# Patient Record
Sex: Female | Born: 1950
Health system: Southern US, Community
[De-identification: ages and names within clinical notes are randomized; demographics above are authoritative.]

## PROBLEM LIST (undated history)

## (undated) DIAGNOSIS — E119 Type 2 diabetes mellitus without complications: Secondary | ICD-10-CM

## (undated) DIAGNOSIS — R51 Headache: Secondary | ICD-10-CM

## (undated) DIAGNOSIS — G629 Polyneuropathy, unspecified: Secondary | ICD-10-CM

## (undated) DIAGNOSIS — F32A Depression, unspecified: Secondary | ICD-10-CM

## (undated) DIAGNOSIS — K219 Gastro-esophageal reflux disease without esophagitis: Secondary | ICD-10-CM

## (undated) DIAGNOSIS — M069 Rheumatoid arthritis, unspecified: Secondary | ICD-10-CM

## (undated) DIAGNOSIS — F329 Major depressive disorder, single episode, unspecified: Secondary | ICD-10-CM

## (undated) DIAGNOSIS — I10 Essential (primary) hypertension: Secondary | ICD-10-CM

## (undated) DIAGNOSIS — M1990 Unspecified osteoarthritis, unspecified site: Secondary | ICD-10-CM

## (undated) DIAGNOSIS — IMO0001 Reserved for inherently not codable concepts without codable children: Secondary | ICD-10-CM

## (undated) DIAGNOSIS — E785 Hyperlipidemia, unspecified: Secondary | ICD-10-CM

## (undated) DIAGNOSIS — T7840XA Allergy, unspecified, initial encounter: Secondary | ICD-10-CM

## (undated) DIAGNOSIS — E1142 Type 2 diabetes mellitus with diabetic polyneuropathy: Secondary | ICD-10-CM

## (undated) DIAGNOSIS — D649 Anemia, unspecified: Secondary | ICD-10-CM

## (undated) DIAGNOSIS — K52832 Lymphocytic colitis: Secondary | ICD-10-CM

## (undated) DIAGNOSIS — R519 Headache, unspecified: Secondary | ICD-10-CM

## (undated) DIAGNOSIS — K621 Rectal polyp: Secondary | ICD-10-CM

## (undated) HISTORY — DX: Major depressive disorder, single episode, unspecified: F32.9

## (undated) HISTORY — DX: Lymphocytic colitis: K52.832

## (undated) HISTORY — PX: TONSILLECTOMY: SUR1361

## (undated) HISTORY — DX: Allergy, unspecified, initial encounter: T78.40XA

## (undated) HISTORY — DX: Rectal polyp: K62.1

## (undated) HISTORY — DX: Type 2 diabetes mellitus with diabetic polyneuropathy: E11.42

## (undated) HISTORY — DX: Depression, unspecified: F32.A

## (undated) HISTORY — DX: Hyperlipidemia, unspecified: E78.5

## (undated) HISTORY — DX: Rheumatoid arthritis, unspecified: M06.9

## (undated) HISTORY — DX: Polyneuropathy, unspecified: G62.9

## (undated) HISTORY — PX: WISDOM TOOTH EXTRACTION: SHX21

## (undated) HISTORY — DX: Type 2 diabetes mellitus without complications: E11.9

## (undated) HISTORY — DX: Essential (primary) hypertension: I10

## (undated) HISTORY — DX: Gastro-esophageal reflux disease without esophagitis: K21.9

## (undated) HISTORY — DX: Anemia, unspecified: D64.9

## (undated) HISTORY — PX: CYST REMOVAL TRUNK: SHX6283

## (undated) HISTORY — DX: Unspecified osteoarthritis, unspecified site: M19.90

---

## 1998-04-03 ENCOUNTER — Other Ambulatory Visit: Admission: RE | Admit: 1998-04-03 | Discharge: 1998-04-03 | Payer: Self-pay

## 1999-12-23 ENCOUNTER — Other Ambulatory Visit: Admission: RE | Admit: 1999-12-23 | Discharge: 1999-12-23 | Payer: Self-pay | Admitting: Family Medicine

## 2000-02-16 ENCOUNTER — Other Ambulatory Visit: Admission: RE | Admit: 2000-02-16 | Discharge: 2000-02-16 | Payer: Self-pay | Admitting: Family Medicine

## 2000-09-08 ENCOUNTER — Encounter: Admission: RE | Admit: 2000-09-08 | Discharge: 2000-09-20 | Payer: Self-pay | Admitting: Family Medicine

## 2002-01-31 ENCOUNTER — Other Ambulatory Visit: Admission: RE | Admit: 2002-01-31 | Discharge: 2002-01-31 | Payer: Self-pay | Admitting: Family Medicine

## 2002-08-17 HISTORY — PX: COLONOSCOPY: SHX174

## 2002-08-17 HISTORY — PX: ESOPHAGOGASTRODUODENOSCOPY: SHX1529

## 2002-08-28 ENCOUNTER — Ambulatory Visit (HOSPITAL_COMMUNITY): Admission: RE | Admit: 2002-08-28 | Discharge: 2002-08-28 | Payer: Self-pay | Admitting: Internal Medicine

## 2003-03-13 ENCOUNTER — Other Ambulatory Visit: Admission: RE | Admit: 2003-03-13 | Discharge: 2003-03-13 | Payer: Self-pay | Admitting: Family Medicine

## 2003-05-18 ENCOUNTER — Ambulatory Visit (HOSPITAL_BASED_OUTPATIENT_CLINIC_OR_DEPARTMENT_OTHER): Admission: RE | Admit: 2003-05-18 | Discharge: 2003-05-18 | Payer: Self-pay | Admitting: General Surgery

## 2003-05-18 ENCOUNTER — Encounter (INDEPENDENT_AMBULATORY_CARE_PROVIDER_SITE_OTHER): Payer: Self-pay | Admitting: Specialist

## 2003-05-18 ENCOUNTER — Ambulatory Visit (HOSPITAL_COMMUNITY): Admission: RE | Admit: 2003-05-18 | Discharge: 2003-05-18 | Payer: Self-pay | Admitting: General Surgery

## 2004-12-10 ENCOUNTER — Ambulatory Visit: Payer: Self-pay | Admitting: Internal Medicine

## 2005-08-11 ENCOUNTER — Other Ambulatory Visit: Admission: RE | Admit: 2005-08-11 | Discharge: 2005-08-11 | Payer: Self-pay | Admitting: Family Medicine

## 2006-12-30 ENCOUNTER — Ambulatory Visit: Payer: Self-pay | Admitting: Internal Medicine

## 2009-01-21 DIAGNOSIS — K208 Other esophagitis: Secondary | ICD-10-CM

## 2009-01-21 DIAGNOSIS — M069 Rheumatoid arthritis, unspecified: Secondary | ICD-10-CM | POA: Insufficient documentation

## 2009-01-21 DIAGNOSIS — K219 Gastro-esophageal reflux disease without esophagitis: Secondary | ICD-10-CM

## 2009-01-21 DIAGNOSIS — R609 Edema, unspecified: Secondary | ICD-10-CM | POA: Insufficient documentation

## 2009-01-21 DIAGNOSIS — M779 Enthesopathy, unspecified: Secondary | ICD-10-CM | POA: Insufficient documentation

## 2009-01-21 DIAGNOSIS — E669 Obesity, unspecified: Secondary | ICD-10-CM | POA: Insufficient documentation

## 2010-12-30 NOTE — Assessment & Plan Note (Signed)
Donna Rogers                CHART#:  [id number]   DATE:  12/30/2006                       DOB:  07-02-1951   FOLLOWUP OFFICE VISIT   CHIEF COMPLAINT:  A 2 year followup for chronic GERD.   SUBJECTIVE:  Donna Rogers is a 60 year old Caucasian female with a history  of erosive reflux esophagitis and chronic GERD.  She has been maintained  on Protonix 40 mg daily for years now.  She denies any breakthrough  heartburn, indigestion, dysphagia, odynophagia, anorexia, early satiety.  Her weight has remained stable.   PAST MEDICAL HISTORY:  She has tendonitis of right shoulder, lower  extremity edema, chronic GERD, erosive reflux esophagitis.  Last EGD was  January of 2004, and she had a screening colonoscopy at the same time.  She had distal esophageal erosions consistent with non-erosive reflux  esophagitis, a patulous gastroesophageal junction, small hiatal hernia,  otherwise, normal exam.  She also had internal hemorrhoids on her  colonoscopy.   CURRENT MEDICATIONS:  See the list from Dec 30, 2006.   FAMILY HISTORY:  No known chronic GI problems.   SOCIAL HISTORY:  Donna Rogers is single.  She has 1 healthy child.  She is  employed with [employer]  She is a smoker.  Denies any alcohol or  drug use.   REVIEW OF SYSTEMS:  CONSTITUTIONAL:  Denies any fevers or chills.  CARDIOVASCULAR:  Denies chest pain or palpitations.  PULMONARY:  Denies shortness of breath, wheezing, cough, hemoptysis.  GASTROINTESTINAL:  See HPI.  Denies any rectal bleeding, melena, constipation, or diarrhea.   PHYSICAL EXAM:  VITAL SIGNS:  Weight 194 pounds, height 63 inches,  temperature 98.8.  Blood pressure 118/82, pulse 64.  GENERAL:  Donna Rogers is a 60 year old Caucasian female who is alert,  pleasant, cooperative, in no acute distress.  HEENT:  Pupils equal, round, and reactive to light.  Clear.  No icterus.  Conjunctivae clear.  Oropharynx pink and moist without any lesions.  HEART:   Regular rate and rhythm.  Normal S1, S2 without murmurs, clicks,  rubs, or gallops.  LUNGS:  Clear to auscultation bilaterally.  ABDOMEN:  Positive bowel sounds x4.  No bruits auscultated.  Soft,  nontender, nondistended without palpable mass, hepatosplenomegaly.  No  rebound tenderness, guarding.  EXTREMITIES:  Without clubbing or edema bilaterally.   ASSESSMENT:  Donna Rogers is a 60 year old Caucasian female with a history  of chronic gastroesophageal reflux disease/erosive reflux esophagitis,  well-controlled on daily proton pump inhibitor.   PLAN:  1. Protonix 40 mg daily #31 with 11 refills.  2. We will either see her back in 2 years, or she can follow up with      her primary care Donna Rogers, Donna Rogers, at [facility] in [place] if she chooses.  3. She is due for a colonoscopy in January, 2014.       Nicholas Lose, N.P.  Electronically Signed     R. Roetta Sessions, M.D.  Electronically Signed    KC/MEDQ  D:  12/31/2006  T:  12/31/2006  Job:  [id number]   cc:   Donna Rogers

## 2011-01-02 NOTE — Op Note (Signed)
   [name], Donna Rogers                         ACCOUNT NO.:  [id number]   MEDICAL RECORD NO.:  [id number]                   PATIENT TYPE:  AMB   LOCATION:  DSC                                  FACILITY:  [facility]   PHYSICIAN:  Rose Phi. Maple Hudson, M.D.                DATE OF BIRTH:  11/26/1950   DATE OF PROCEDURE:  08/26/2002  DATE OF DISCHARGE:                                 OPERATIVE REPORT   PREOPERATIVE DIAGNOSIS:  Cyst of the right breast.   POSTOPERATIVE DIAGNOSIS:  Cyst of the right breast.   OPERATION:  Excision of cyst of the right breast.   SURGEON:  Rose Phi. Maple Hudson, M.D.   ANESTHESIA:  MAC.   DESCRIPTION OF PROCEDURE:  The patient was placed on the operating table  with the arms extended on the arm board and the right breast was prepped and  draped in the usual sterile fashion. A palpable cyst in the medial portion  of the right breast was outlined and the area was infiltrated with 1%  Xylocaine with adrenaline.   A wedge of skin  and the cyst which turned out to be an inclusion cyst in  the skin  was incised. Hemostasis was achieved with the cautery. The  incision was closed with interrupted 4-0 nylon sutures. A dressing  was  applied.   The patient was transferred to the recovery room in satisfactory condition.  She tolerated  the procedure well.                                               Rose Phi. Maple Hudson, M.D.    PRY/MEDQ  D:  05/18/2003  T:  05/19/2003  Job:  [id number]

## 2011-01-02 NOTE — Op Note (Signed)
Donna Rogers, HORKEY                         ACCOUNT NO.:  [id number]   MEDICAL RECORD NO.:  [id number]                   PATIENT TYPE:  AMB   LOCATION:  DAY                                  FACILITY:  [facility]   PHYSICIAN:  R. Roetta Sessions, M.D.              DATE OF BIRTH:  Aug 09, 1951   DATE OF PROCEDURE:  08/28/2002  DATE OF DISCHARGE:                                 OPERATIVE REPORT   PROCEDURE PERFORMED:  Diagnostic esophagogastroduodenoscopy followed by  screening colonoscopy.   INDICATIONS FOR PROCEDURE:  The patient is a 60 year old lady with  longstanding gastroesophageal reflux symptoms fairly well controlled on  Protonix 40 mg orally daily.  She also comes for screening colonoscopy.  Esophagogastroduodenoscopy is now being done to evaluate longstanding GERD.  Colonoscopy is being done as standard screening maneuver.  This approach has  been discussed with the patient at length previously.  The potential risks,  benefits and alternatives have been reviewed.   DESCRIPTION OF PROCEDURE:  Oxygen saturations, blood pressure, pulse and  respirations were monitored throughout the entire procedure.   CONSCIOUS SEDATION:  Versed 3 mg IV, Demerol 50 mg IV in divided doses.   INSTRUMENT:  Olympus videoscopic adult gastroscope and colonoscope.   FINDINGS:  ESOPHAGUS:  Examination of tubular esophagus revealed a couple of  tiny distal esophageal erosions.  Patulous esophagogastric junction with  Barrett's esophagus.  Mucosa appeared normal otherwise.  The esophagogastric  junction easily traversed.   STOMACH:  Gastric cavity was emptied.  It insufflated well with air.  Examination of the gastric mucosa including retroflex view of the proximal  stomach, esophagogastric junction demonstrated only a small hiatal hernia.  Pylorus was patent and easily traversed .   DUODENUM:  The bulb and second portion appeared normal.   THERAPEUTIC/DIAGNOSTIC MANEUVERS:  None.   The patient  tolerated the procedure and was prepared for colonoscopy.  A  digital rectal examination revealed no abnormalities.   ENDOSCOPIC FINDINGS:  The prep was good.   RECTUM:  Examination of rectal mucosa including retroflex view of the anal  verge revealed no abnormalities aside from internal hemorrhoids.   COLON:  The colonic mucosa was surveyed from the rectosigmoid junction to  the left, transverse and right colon to the area of the appendiceal orifice,  ileocecal valve and cecum.  The colonic mucosa to the cecum appeared normal.  The cecum and ileocecal valve, appendiceal orifice was photographed for the  record.  From this level the scope was slowly withdrawn.  The previously  mentioned mucosal surfaces were again seen and no abnormalities were  observed.  The patient tolerated both procedures well and was reacted in  endoscopy.   IMPRESSION:  Esophagogastroduodenoscopy:  1. Distal esophageal erosions consistent with nonerosive reflux esophagitis.     Patulous esophagogastric junction.  2. Small hiatal hernia.  Otherwise stomach and duodenum to second portion     appeared normal.  Colonoscopy findings.  1. Internal hemorrhoids, otherwise normal rectum.  2. Normal colon.   RECOMMENDATIONS:  1. Increase Protonix to 40 mg orally b.i.d.  2. Antireflux measures were given to the patient.  3. Follow-up appointment will be in six weeks.  4. Repeat colonoscopy 10 years.                                               Jonathon Bellows, M.D.    RMR/MEDQ  D:  08/28/2002  T:  08/28/2002  Job:  [id number]

## 2011-01-02 NOTE — Consult Note (Signed)
Donna Rogers, Donna Rogers                     ACCOUNT NO.:  [id number]   MEDICAL RECORD NO.:  [id number]                  PATIENT TYPE:   LOCATION:                                       FACILITY:   PHYSICIAN:  R. Roetta Sessions, M.D.              DATE OF BIRTH:  20-Oct-1950   DATE OF CONSULTATION:  08/16/2002  DATE OF DISCHARGE:                                   CONSULTATION   CHIEF COMPLAINT:  Longstanding gastroesophageal reflux disease; need for  colorectal cancer screening.   HISTORY OF PRESENT ILLNESS:  The patient is a pleasant 60 year old lady who  is in the office today for her follow-up visit for gastroesophageal reflux  disease.  Reflux symptoms have been well controlled on Protonix 40 mg orally  daily.  No odynophagia, dysphagia, early satiety, acid reflux symptoms for  several years.  A separate issue is she is now approaching 52 and is  interested in colorectal cancer screening.  She is devoid of any lower GI  tract symptoms aside from intermittent constipation; no rectal bleeding,  etc.  Again, no family history of colorectal dysplasia.  She was last seen  back in April 2003 and was felt to be doing well at that time.   PAST MEDICAL HISTORY:  1. Rheumatoid arthritis followed by Dr. Phylliss Bob in [place].  2. History of right shoulder tendonitis.  3. Fluid retention.  4. Gastroesophageal reflux disease.  5. Obesity.   PAST SURGICAL HISTORY:  Pilonidal cyst resection.   CURRENT MEDICATIONS:  1. Ex-Lax p.r.n. constipation.  2. Arthrotec 75 daily.  3. Folic acid 1 mg daily.  4. Prednisone 5 mg daily.  5. Protonix 40 mg daily.  6. Lexapro 10 mg daily.  7. Methotrexate 2.5 mg.   ALLERGIES:  No known drug allergies.   FAMILY HISTORY:  Mother is alive, in fairly good health.  Father is  deceased, had diabetes.  No history of chronic GI or liver illness.   SOCIAL HISTORY:  The patient is recently married, has one child.  She is  employed with [employer].  No  alcohol consumption.  She has been a  light smoker - less than a half a pack of cigarettes per day.   REVIEW OF SYSTEMS:  No chest pain, no dyspnea on exertion.  She has gained  approximately 50 pounds in the past three years.   PHYSICAL EXAMINATION:  GENERAL:  A pleasant 60 year old lady, resting  comfortably.  Weight 190, blood pressure 120/90, pulse 88.  SKIN:  Warm and dry, no jaundice, no cutaneous stigmata of chronic liver  disease.  HEENT:  No scleral icterus.  Conjunctivae are pink.  Oral cavity with no  lesions.  CHEST:  Lungs are clear to auscultation.  CARDIAC:  Regular rate and rhythm without murmur, gallop, or rub.  BREAST:  Exam is deferred.  ABDOMEN:  Obese, positive bowel sounds, soft, nontender, without appreciable  mass or organomegaly.  EXTREMITIES:  No edema.  RECTAL:  Deferred until time of colonoscopy.   IMPRESSION:  The patient is a pleasant 60 year old lady who will be 52 next  month who comes to see me for consideration of colorectal cancer screening.  We discussed the standard approach for colorectal cancer screening.  We  talked about colonoscopy - potential risks, benefits, and alternatives.  She  is agreeable and will plan her for colonoscopy in the near future.   She has a longstanding gastroesophageal reflux disease, never had an EGD.  Although she has not have any alarm symptoms her symptoms are somewhat  longstanding and have responded nicely to proton pump inhibitor therapy.  I  feel it would be a good idea to go ahead and offer this nice lady a one-time  EGD just to look closely at her esophageal mucosa.  To this end, I have  offered EGD and colonoscopy at the same time the first of the year.  Her  questions were answered; she is agreeable.  I have refilled her Protonix.  Further recommendations to follow ASAP.                                               Jonathon Bellows, M.D.    RMR/MEDQ  D:  08/16/2002  T:  08/16/2002  Job:  [id number]

## 2012-11-18 ENCOUNTER — Ambulatory Visit: Payer: Self-pay | Admitting: Physician Assistant

## 2012-11-18 ENCOUNTER — Telehealth: Payer: Self-pay | Admitting: Nurse Practitioner

## 2012-11-18 ENCOUNTER — Encounter: Payer: Self-pay | Admitting: Physician Assistant

## 2012-11-18 VITALS — BP 132/80 | HR 73 | Temp 96.5°F | Ht 61.25 in | Wt 214.0 lb

## 2012-11-18 DIAGNOSIS — J069 Acute upper respiratory infection, unspecified: Secondary | ICD-10-CM

## 2012-11-18 DIAGNOSIS — J322 Chronic ethmoidal sinusitis: Secondary | ICD-10-CM

## 2012-11-18 DIAGNOSIS — M5432 Sciatica, left side: Secondary | ICD-10-CM

## 2012-11-18 DIAGNOSIS — M5430 Sciatica, unspecified side: Secondary | ICD-10-CM

## 2012-11-18 MED ORDER — TRAMADOL HCL 50 MG PO TABS: 50.0000 mg | ORAL_TABLET | Freq: Three times a day (TID) | ORAL | Status: DC | PRN

## 2012-11-18 MED ORDER — PREDNISONE 10 MG PO TABS: 10.0000 mg | ORAL_TABLET | Freq: Every day | ORAL | Status: DC

## 2012-11-18 MED ORDER — AMOXICILLIN-POT CLAVULANATE 875-125 MG PO TABS: 1.0000 | ORAL_TABLET | Freq: Two times a day (BID) | ORAL | Status: DC

## 2012-11-18 MED ORDER — AMOXICILLIN 875 MG PO TABS: 875.0000 mg | ORAL_TABLET | Freq: Two times a day (BID) | ORAL | Status: DC

## 2012-11-18 NOTE — Progress Notes (Signed)
  Subjective:    Patient ID: Donna Rogers, female    DOB: Sep 12, 1950, 62 y.o.   MRN: [id number]  HPI Nasal congestion, using an OTC nasal space and nose is blocked up unless she sprays; Ankle no accident no injury - burning pain along tibial region    Review of Systems  Constitutional: Positive for fatigue.  HENT: Positive for ear pain, congestion and sinus pressure.   Neurological: Positive for numbness.       Burning pain in left leg  All other systems reviewed and are negative.       Objective:   Physical Exam  Vitals reviewed. Constitutional: She appears well-developed and well-nourished.  HENT:  Head: Normocephalic and atraumatic.  Right Ear: External ear normal.  Left Ear: External ear normal.  Dry nasal passages; pt having difficulty breathing thru nose maxofacial tenderness  Neck: Normal range of motion. No tracheal deviation present.  Cardiovascular: Normal rate, regular rhythm and normal heart sounds.   Pulmonary/Chest: Effort normal and breath sounds normal. No stridor.  Neurological:  Burning sensation down left medial tibia to medial mlleolar region          Assessment & Plan:  Acute upper respiratory infections of unspecified site - Plan: predniSONE (DELTASONE) 10 MG tablet  Ethmoid sinusitis - Plan: amoxicillin-clavulanate (AUGMENTIN) 875-125 MG per tablet  Sciatica, left - Plan: predniSONE (DELTASONE) 10 MG tablet, traMADol (ULTRAM) 50 MG tablet

## 2012-11-18 NOTE — Telephone Encounter (Signed)
APPT MADE

## 2012-12-05 ENCOUNTER — Telehealth: Payer: Self-pay | Admitting: Nurse Practitioner

## 2012-12-05 MED ORDER — FLUCONAZOLE 150 MG PO TABS: 150.0000 mg | ORAL_TABLET | Freq: Once | ORAL | Status: DC

## 2012-12-05 NOTE — Telephone Encounter (Signed)
Pt aware.

## 2012-12-05 NOTE — Telephone Encounter (Signed)
Diflucan called in.

## 2012-12-06 NOTE — Progress Notes (Signed)
Encounter opened in error by Baruch Gouty.

## 2013-01-06 ENCOUNTER — Other Ambulatory Visit: Payer: Self-pay | Admitting: *Deleted

## 2013-01-06 MED ORDER — FOLIC ACID 1 MG PO TABS: 1.0000 mg | ORAL_TABLET | Freq: Every day | ORAL | Status: DC

## 2013-01-10 ENCOUNTER — Encounter: Payer: Self-pay | Admitting: General Practice

## 2013-01-23 ENCOUNTER — Other Ambulatory Visit: Payer: Self-pay | Admitting: *Deleted

## 2013-01-23 MED ORDER — OMEPRAZOLE 20 MG PO CPDR: 20.0000 mg | DELAYED_RELEASE_CAPSULE | Freq: Every day | ORAL | Status: DC

## 2013-01-23 NOTE — Telephone Encounter (Signed)
LAS OV 07/06/12.

## 2013-02-13 ENCOUNTER — Other Ambulatory Visit: Payer: Self-pay | Admitting: Nurse Practitioner

## 2013-02-13 MED ORDER — CITALOPRAM HYDROBROMIDE 20 MG PO TABS: 20.0000 mg | ORAL_TABLET | Freq: Every day | ORAL | Status: DC

## 2013-02-13 NOTE — Telephone Encounter (Signed)
LAST OV 11/13. NTBS. 

## 2013-02-14 NOTE — Telephone Encounter (Signed)
Med  Approved by Paulene Floor

## 2013-02-21 ENCOUNTER — Other Ambulatory Visit: Payer: Self-pay | Admitting: *Deleted

## 2013-02-21 MED ORDER — CITALOPRAM HYDROBROMIDE 20 MG PO TABS: 20.0000 mg | ORAL_TABLET | Freq: Every day | ORAL | Status: DC

## 2013-02-21 NOTE — Telephone Encounter (Signed)
Last seen 11/13.

## 2013-03-02 ENCOUNTER — Encounter: Payer: Self-pay | Admitting: Family Medicine

## 2013-03-02 ENCOUNTER — Ambulatory Visit (INDEPENDENT_AMBULATORY_CARE_PROVIDER_SITE_OTHER): Payer: Self-pay | Admitting: Family Medicine

## 2013-03-02 VITALS — BP 139/83 | HR 86 | Temp 98.5°F | Wt 209.8 lb

## 2013-03-02 DIAGNOSIS — F17200 Nicotine dependence, unspecified, uncomplicated: Secondary | ICD-10-CM

## 2013-03-02 DIAGNOSIS — R635 Abnormal weight gain: Secondary | ICD-10-CM

## 2013-03-02 DIAGNOSIS — E669 Obesity, unspecified: Secondary | ICD-10-CM

## 2013-03-02 DIAGNOSIS — M069 Rheumatoid arthritis, unspecified: Secondary | ICD-10-CM

## 2013-03-02 DIAGNOSIS — Z720 Tobacco use: Secondary | ICD-10-CM

## 2013-03-02 NOTE — Progress Notes (Signed)
Patient ID: Donna Rogers, female   DOB: 1951-02-12, 62 y.o.   MRN: [id number] SUBJECTIVE: CC: Chief Complaint  Patient presents with  . Acute Visit    ankle pain bilateral  ongoing fore months c/o ;loss of feeling in left leg started 1 month ago  . Medication Refill    needs rx for hydrocodone and does she need to continue methroxate  refill diclofenac    HPI: Has RA. Lost insurance and is presently unemployed. So she stopped seeing DR Dierdre Forth who is the Rheumatologist at the [facility]. Was on Methtrexate and was stable. Ankles are puffy and painfull. And wrists are the same. Wants pain pills.  Past Medical History  Diagnosis Date  . Depression   . GERD (gastroesophageal reflux disease)   . Hyperlipidemia   . Anemia   . Arthritis    Past Surgical History  Procedure Laterality Date  . Tonsillectomy    . Cyst removal trunk     History   Social History  . Marital Status: Legally Separated    Spouse Name: N/A    Number of Children: N/A  . Years of Education: N/A   Occupational History  . Not on file.   Social History Main Topics  . Smoking status: Current Every Day Smoker -- 1.00 packs/day for 36 years    Types: Cigarettes  . Smokeless tobacco: Not on file  . Alcohol Use: No  . Drug Use: No  . Sexually Active: Not on file   Other Topics Concern  . Not on file   Social History Narrative  . No narrative on file   Family History  Problem Relation Age of Onset  . Alzheimer's disease Mother   . Cancer Mother   . Diabetes Father    Current Outpatient Prescriptions on File Prior to Visit  Medication Sig Dispense Refill  . citalopram (CELEXA) 20 MG tablet Take 1 tablet (20 mg total) by mouth daily.  30 tablet  0  . diclofenac (VOLTAREN) 75 MG EC tablet Take 75 mg by mouth daily.      . fish oil-omega-3 fatty acids 1000 MG capsule Take 2 g by mouth daily.      . folic acid (FOLVITE) 1 MG tablet Take 1 tablet (1 mg total) by mouth daily.  30 tablet   2  . omeprazole (PRILOSEC) 20 MG capsule Take 1 capsule (20 mg total) by mouth daily.  90 capsule  0  . simvastatin (ZOCOR) 10 MG tablet Take 10 mg by mouth at bedtime.      Marland Kitchen amoxicillin (AMOXIL) 875 MG tablet Take 1 tablet (875 mg total) by mouth 2 (two) times daily.  20 tablet  0  . amoxicillin-clavulanate (AUGMENTIN) 875-125 MG per tablet Take 1 tablet by mouth 2 (two) times daily.  20 tablet  0  . fluconazole (DIFLUCAN) 150 MG tablet Take 1 tablet (150 mg total) by mouth once.  1 tablet  0  . traMADol (ULTRAM) 50 MG tablet Take 1 tablet (50 mg total) by mouth every 8 (eight) hours as needed for pain.  30 tablet  0   No current facility-administered medications on file prior to visit.   Allergies  Allergen Reactions  . Ciprofloxacin Hcl Rash    There is no immunization history on file for this patient. Prior to Admission medications   Medication Sig Start Date End Date Taking? Authorizing Provider  citalopram (CELEXA) 20 MG tablet Take 1 tablet (20 mg total) by mouth daily. 02/21/13  Yes Mary-Margaret Daphine Deutscher, FNP  diclofenac (VOLTAREN) 75 MG EC tablet Take 75 mg by mouth daily.   Yes Historical Provider, MD  fish oil-omega-3 fatty acids 1000 MG capsule Take 2 g by mouth daily.   Yes Historical Provider, MD  folic acid (FOLVITE) 1 MG tablet Take 1 tablet (1 mg total) by mouth daily. 01/06/13  Yes Ernestina Penna, MD  omeprazole (PRILOSEC) 20 MG capsule Take 1 capsule (20 mg total) by mouth daily. 01/23/13  Yes Mae Shelda Altes, FNP  simvastatin (ZOCOR) 10 MG tablet Take 10 mg by mouth at bedtime.   Yes Historical Provider, MD  amoxicillin (AMOXIL) 875 MG tablet Take 1 tablet (875 mg total) by mouth 2 (two) times daily. 11/18/12   Horald Pollen, PA-C  amoxicillin-clavulanate (AUGMENTIN) 875-125 MG per tablet Take 1 tablet by mouth 2 (two) times daily. 11/18/12   Horald Pollen, PA-C  fluconazole (DIFLUCAN) 150 MG tablet Take 1 tablet (150 mg total) by mouth once. 12/05/12   Mary-Margaret Daphine Deutscher,  FNP  methotrexate (RHEUMATREX) 2.5 MG tablet Take 7.5 mg by mouth 3 (three) times a week.    Historical Provider, MD  traMADol (ULTRAM) 50 MG tablet Take 1 tablet (50 mg total) by mouth every 8 (eight) hours as needed for pain. 11/18/12   Horald Pollen, PA-C     ROS: As above in the HPI. All other systems are stable or negative.  OBJECTIVE: APPEARANCE:  Patient in no acute distress.The patient appeared well nourished and normally developed. Acyanotic. Waist: VITAL SIGNS:BP 139/83  Pulse 86  Temp(Src) 98.5 F (36.9 C) (Oral)  Wt 209 lb 12.8 oz (95.165 kg)  BMI 39.31 kg/m2  Obese WF, short stature.  SKIN: warm and  Dry without overt rashes, tattoos and scars  HEAD and Neck: without JVD, Head and scalp: normal Eyes:No scleral icterus. Fundi normal, eye movements normal. Ears: Auricle normal, canal normal, Tympanic membranes normal, insufflation normal. Nose: normal Throat: normal Neck & thyroid: normal  CHEST & LUNGS: Chest wall: normal Lungs: Clear  CVS: Reveals the PMI to be normally located. Regular rhythm, First and Second Heart sounds are normal,  absence of murmurs, rubs or gallops. Peripheral vasculature: Radial pulses: normal Dorsal pedis pulses: normal Posterior pulses: normal  ABDOMEN:  Appearance: normal Benign, no organomegaly, no masses, no Abdominal Aortic enlargement. No Guarding , no rebound. No Bruits. Bowel sounds: normal  RECTAL: N/A GU: N/A  EXTREMETIES: nonedematous..  MUSCULOSKELETAL:  Spine: normal Joints: wrists and ankles puffy. Tenderness. Synovitis.  NEUROLOGIC: oriented to time,place and person; nonfocal.   ASSESSMENT: Rheumatoid arthritis flare  Tobacco abuse  Obesity, unspecified  PLAN: counseled extensively on her poor choices and poor habits. Her priority is to smoke 1  1/2 PPD of cigarettes. Than to take care of her health. Advised she needs to to improve her poor diet and smoking cessation is emphasized. She needs  to see Dr Dierdre Forth for reinstatting the methotrexate therapy and teh appropriaate follow up. She should call and  Discuss with him a payment plan if it exists. She was advised to apply for insurance under the [employer].  Return if symptoms worsen or fail to improve.  Kedar Sedano P. Modesto Charon, M.D.

## 2013-03-20 ENCOUNTER — Other Ambulatory Visit: Payer: Self-pay | Admitting: *Deleted

## 2013-03-20 MED ORDER — SIMVASTATIN 10 MG PO TABS: 10.0000 mg | ORAL_TABLET | Freq: Every day | ORAL | Status: DC

## 2013-03-20 NOTE — Telephone Encounter (Signed)
Call in Rx please

## 2013-03-20 NOTE — Telephone Encounter (Signed)
Patient seen in office on 03-02-13, Did not see any recent labs. Please advise.

## 2013-03-20 NOTE — Telephone Encounter (Signed)
rx  Called to [facility]

## 2013-04-07 ENCOUNTER — Other Ambulatory Visit: Payer: Self-pay | Admitting: Family Medicine

## 2013-04-07 NOTE — Telephone Encounter (Signed)
Last lipid 07/06/12  [facility]

## 2013-04-10 ENCOUNTER — Other Ambulatory Visit: Payer: Self-pay | Admitting: *Deleted

## 2013-04-10 MED ORDER — CITALOPRAM HYDROBROMIDE 20 MG PO TABS: 20.0000 mg | ORAL_TABLET | Freq: Every day | ORAL | Status: DC

## 2013-04-20 ENCOUNTER — Other Ambulatory Visit: Payer: Self-pay | Admitting: General Practice

## 2013-05-08 ENCOUNTER — Ambulatory Visit (INDEPENDENT_AMBULATORY_CARE_PROVIDER_SITE_OTHER): Payer: Self-pay

## 2013-05-08 DIAGNOSIS — Z111 Encounter for screening for respiratory tuberculosis: Secondary | ICD-10-CM

## 2013-05-10 LAB — TB SKIN TEST

## 2013-05-16 ENCOUNTER — Other Ambulatory Visit: Payer: Self-pay

## 2013-05-16 MED ORDER — SIMVASTATIN 10 MG PO TABS: 10.0000 mg | ORAL_TABLET | Freq: Every day | ORAL | Status: DC

## 2013-05-16 NOTE — Telephone Encounter (Signed)
Patient needs to be seen. Has exceeded time since last visit. Needs to bring all medications to next appointment. Needs labwork done. Last refill

## 2013-05-16 NOTE — Telephone Encounter (Signed)
Last seen 03/02/13  [facility]  Last lipids 07/06/12

## 2013-05-18 NOTE — Telephone Encounter (Signed)
PT NOTIFIED  

## 2013-05-25 ENCOUNTER — Ambulatory Visit (INDEPENDENT_AMBULATORY_CARE_PROVIDER_SITE_OTHER): Payer: Self-pay | Admitting: Family Medicine

## 2013-05-25 ENCOUNTER — Encounter: Payer: Self-pay | Admitting: Family Medicine

## 2013-05-25 VITALS — BP 139/64 | HR 73 | Temp 97.5°F | Ht 61.25 in | Wt 213.0 lb

## 2013-05-25 DIAGNOSIS — F329 Major depressive disorder, single episode, unspecified: Secondary | ICD-10-CM

## 2013-05-25 DIAGNOSIS — E785 Hyperlipidemia, unspecified: Secondary | ICD-10-CM

## 2013-05-25 DIAGNOSIS — Z23 Encounter for immunization: Secondary | ICD-10-CM

## 2013-05-25 DIAGNOSIS — K219 Gastro-esophageal reflux disease without esophagitis: Secondary | ICD-10-CM

## 2013-05-25 MED ORDER — FOLIC ACID 1 MG PO TABS: 1.0000 mg | ORAL_TABLET | Freq: Every day | ORAL | Status: DC

## 2013-05-25 MED ORDER — OMEPRAZOLE 20 MG PO CPDR: 20.0000 mg | DELAYED_RELEASE_CAPSULE | Freq: Every day | ORAL | Status: DC

## 2013-05-25 MED ORDER — CITALOPRAM HYDROBROMIDE 20 MG PO TABS: 20.0000 mg | ORAL_TABLET | Freq: Every day | ORAL | Status: DC

## 2013-05-25 MED ORDER — SIMVASTATIN 10 MG PO TABS: 10.0000 mg | ORAL_TABLET | Freq: Every day | ORAL | Status: DC

## 2013-05-25 NOTE — Progress Notes (Signed)
  Subjective:    Patient ID: Donna Rogers, female    DOB: 08/25/1950, 62 y.o.   MRN: [id number]  HPI This 62 y.o. female presents for evaluation of follow up.  She needs refills On her medications and she sees RA and has labs done there.   Review of Systems No chest pain, SOB, HA, dizziness, vision change, N/V, diarrhea, constipation, dysuria, urinary urgency or frequency, myalgias, arthralgias or rash.     Objective:   Physical Exam Vital signs noted  Well developed well nourished female.  HEENT - Head atraumatic Normocephalic                Eyes - PERRLA, Conjuctiva - clear Sclera- Clear EOMI                Ears - EAC's Wnl TM's Wnl Gross Hearing WNL                Nose - Nares patent                 Throat - oropharanx wnl Respiratory - Lungs CTA bilateral Cardiac - RRR S1 and S2 without murmur GI - Abdomen soft Nontender and bowel sounds active x 4 Extremities - No edema. Neuro - Grossly intact.       Assessment & Plan:  Need for prophylactic vaccination and inoculation against influenza  Depression- Refill citalopram  Other and unspecified hyperlipidemia - refill simvastatin  GERD (gastroesophageal reflux disease)- refill omeprazole  Deatra Canter FNP

## 2013-05-25 NOTE — Patient Instructions (Signed)

## 2013-06-08 ENCOUNTER — Telehealth: Payer: Self-pay | Admitting: Family Medicine

## 2013-06-13 ENCOUNTER — Other Ambulatory Visit: Payer: Self-pay | Admitting: Family Medicine

## 2013-06-13 DIAGNOSIS — J322 Chronic ethmoidal sinusitis: Secondary | ICD-10-CM

## 2013-06-13 MED ORDER — AMOXICILLIN 875 MG PO TABS: 875.0000 mg | ORAL_TABLET | Freq: Two times a day (BID) | ORAL | Status: DC

## 2013-06-13 NOTE — Telephone Encounter (Signed)
Amoxicillin rx called in

## 2013-06-13 NOTE — Telephone Encounter (Signed)
Bill please address 

## 2013-06-13 NOTE — Telephone Encounter (Signed)
Pt notified. RX called in. 

## 2013-06-13 NOTE — Telephone Encounter (Signed)
Pt wants RX for sinus infection Cannot take Enbrel until sinus infection is cleared up Please advise

## 2014-05-14 ENCOUNTER — Other Ambulatory Visit: Payer: Self-pay | Admitting: Family Medicine

## 2014-05-15 NOTE — Telephone Encounter (Signed)
Last visit on 05-25-13. Please advise on 90 day refill

## 2014-06-06 ENCOUNTER — Other Ambulatory Visit: Payer: Self-pay | Admitting: Family Medicine

## 2014-06-07 NOTE — Telephone Encounter (Signed)
x

## 2014-06-14 ENCOUNTER — Other Ambulatory Visit: Payer: Self-pay | Admitting: *Deleted

## 2014-06-14 ENCOUNTER — Telehealth: Payer: Self-pay | Admitting: Family Medicine

## 2014-06-14 MED ORDER — SIMVASTATIN 10 MG PO TABS: 10.0000 mg | ORAL_TABLET | Freq: Every day | ORAL | Status: DC

## 2014-06-14 MED ORDER — OMEPRAZOLE 20 MG PO CPDR: DELAYED_RELEASE_CAPSULE | ORAL | Status: DC

## 2014-06-14 MED ORDER — CITALOPRAM HYDROBROMIDE 20 MG PO TABS: 20.0000 mg | ORAL_TABLET | Freq: Every day | ORAL | Status: DC

## 2014-06-14 NOTE — Telephone Encounter (Signed)
Patient not able to get an appt till December. Filled meds x1 to [facility]. Patient notified

## 2014-07-19 ENCOUNTER — Ambulatory Visit (INDEPENDENT_AMBULATORY_CARE_PROVIDER_SITE_OTHER): Payer: Self-pay | Admitting: Family Medicine

## 2014-07-19 ENCOUNTER — Encounter: Payer: Self-pay | Admitting: Family Medicine

## 2014-07-19 VITALS — BP 151/83 | HR 74 | Temp 99.0°F | Ht 61.25 in | Wt 216.0 lb

## 2014-07-19 DIAGNOSIS — F329 Major depressive disorder, single episode, unspecified: Secondary | ICD-10-CM

## 2014-07-19 DIAGNOSIS — E785 Hyperlipidemia, unspecified: Secondary | ICD-10-CM

## 2014-07-19 DIAGNOSIS — M069 Rheumatoid arthritis, unspecified: Secondary | ICD-10-CM

## 2014-07-19 DIAGNOSIS — K2100 Gastro-esophageal reflux disease with esophagitis, without bleeding: Secondary | ICD-10-CM

## 2014-07-19 DIAGNOSIS — F32A Depression, unspecified: Secondary | ICD-10-CM

## 2014-07-19 MED ORDER — HYDROCODONE-ACETAMINOPHEN 5-325 MG PO TABS: 1.0000 | ORAL_TABLET | Freq: Four times a day (QID) | ORAL | Status: DC | PRN

## 2014-07-19 MED ORDER — CITALOPRAM HYDROBROMIDE 20 MG PO TABS: 20.0000 mg | ORAL_TABLET | Freq: Every day | ORAL | Status: DC

## 2014-07-19 MED ORDER — SIMVASTATIN 10 MG PO TABS: 10.0000 mg | ORAL_TABLET | Freq: Every day | ORAL | Status: DC

## 2014-07-19 MED ORDER — OMEPRAZOLE 20 MG PO CPDR: DELAYED_RELEASE_CAPSULE | ORAL | Status: DC

## 2014-07-19 NOTE — Progress Notes (Signed)
   Subjective:    Patient ID: Donna Rogers, female    DOB: 05-Sep-1950, 63 y.o.   MRN: [id number]  HPI Patient is here for refills. She has hx of RA and has been on lortab for chronic pain.  She needs refills on her depression med and GERD med.  Review of Systems  Constitutional: Negative for fever.  HENT: Negative for ear pain.   Eyes: Negative for discharge.  Respiratory: Negative for cough.   Cardiovascular: Negative for chest pain.  Gastrointestinal: Negative for abdominal distention.  Endocrine: Negative for polyuria.  Genitourinary: Negative for difficulty urinating.  Musculoskeletal: Negative for gait problem and neck pain.  Skin: Negative for color change and rash.  Neurological: Negative for speech difficulty and headaches.  Psychiatric/Behavioral: Negative for agitation.       Objective:    BP 151/83 mmHg  Pulse 74  Temp(Src) 99 F (37.2 C) (Oral)  Ht 5' 1.25" (1.556 m)  Wt 216 lb (97.977 kg)  BMI 40.47 kg/m2 Physical Exam  Constitutional: She is oriented to person, place, and time. She appears well-developed and well-nourished.  HENT:  Head: Normocephalic and atraumatic.  Mouth/Throat: Oropharynx is clear and moist.  Eyes: Pupils are equal, round, and reactive to light.  Neck: Normal range of motion. Neck supple.  Cardiovascular: Normal rate and regular rhythm.   No murmur heard. Pulmonary/Chest: Effort normal and breath sounds normal.  Abdominal: Soft. Bowel sounds are normal. There is no tenderness.  Neurological: She is alert and oriented to person, place, and time.  Skin: Skin is warm and dry.  Psychiatric: She has a normal mood and affect.          Assessment & Plan:     ICD-9-CM ICD-10-CM   1. Gastroesophageal reflux disease with esophagitis 530.11 K21.0 omeprazole (PRILOSEC) 20 MG capsule  2. Depression 311 F32.9 citalopram (CELEXA) 20 MG tablet  3. Hyperlipidemia 272.4 E78.5 simvastatin (ZOCOR) 10 MG tablet  4. RA (rheumatoid  arthritis) 714.0 M06.9 HYDROcodone-acetaminophen (NORCO/VICODIN) 5-325 MG per tablet     No Follow-up on file.  Lysbeth Penner FNP

## 2014-09-09 ENCOUNTER — Other Ambulatory Visit: Payer: Self-pay | Admitting: Family Medicine

## 2014-09-09 ENCOUNTER — Other Ambulatory Visit: Payer: Self-pay | Admitting: Nurse Practitioner

## 2014-09-10 NOTE — Telephone Encounter (Signed)
No labs in Epic.

## 2014-09-18 ENCOUNTER — Telehealth: Payer: Self-pay | Admitting: Family Medicine

## 2014-09-18 NOTE — Telephone Encounter (Signed)
Left detailed message stating she would need to be seen for evaluation before any antibiotics would be called in, pt to CB to schedule appt.

## 2014-09-18 NOTE — Telephone Encounter (Signed)
Stp advised we had a few openings tonight if she wanted to be seen tonight, pt states she doesn't have the money to be seen. Advised we can't send rx in for antibiotic without being evaluated, pt voiced understanding.

## 2014-12-05 ENCOUNTER — Other Ambulatory Visit: Payer: Self-pay | Admitting: Family Medicine

## 2014-12-06 ENCOUNTER — Other Ambulatory Visit: Payer: Self-pay | Admitting: Nurse Practitioner

## 2014-12-06 NOTE — Telephone Encounter (Signed)
no more refills without being seen  

## 2014-12-06 NOTE — Telephone Encounter (Signed)
Last seen 12/3/165 B [place]  No lipids in EPIC   Requesting 90 day supply

## 2014-12-17 ENCOUNTER — Other Ambulatory Visit: Payer: Self-pay | Admitting: Family Medicine

## 2015-01-15 ENCOUNTER — Encounter: Payer: Self-pay | Admitting: Family Medicine

## 2015-01-15 ENCOUNTER — Ambulatory Visit (INDEPENDENT_AMBULATORY_CARE_PROVIDER_SITE_OTHER): Payer: Self-pay | Admitting: Family Medicine

## 2015-01-15 VITALS — BP 154/96 | HR 81 | Temp 99.1°F | Ht 61.25 in | Wt 206.0 lb

## 2015-01-15 DIAGNOSIS — R197 Diarrhea, unspecified: Secondary | ICD-10-CM

## 2015-01-15 DIAGNOSIS — I152 Hypertension secondary to endocrine disorders: Secondary | ICD-10-CM | POA: Insufficient documentation

## 2015-01-15 DIAGNOSIS — E1159 Type 2 diabetes mellitus with other circulatory complications: Secondary | ICD-10-CM | POA: Insufficient documentation

## 2015-01-15 DIAGNOSIS — I10 Essential (primary) hypertension: Secondary | ICD-10-CM

## 2015-01-15 MED ORDER — HYDROCORTISONE 2.5 % RE CREA: 1.0000 "application " | TOPICAL_CREAM | Freq: Two times a day (BID) | RECTAL | Status: DC

## 2015-01-15 NOTE — Patient Instructions (Signed)
Take Align (probiotic) and Immodium to help with diarrhea   Diarrhea Diarrhea is frequent loose and watery bowel movements. It can cause you to feel weak and dehydrated. Dehydration can cause you to become tired and thirsty, have a dry mouth, and have decreased urination that often is dark yellow. Diarrhea is a sign of another problem, most often an infection that will not last long. In most cases, diarrhea typically lasts 2-3 days. However, it can last longer if it is a sign of something more serious. It is important to treat your diarrhea as directed by your caregiver to lessen or prevent future episodes of diarrhea. CAUSES  Some common causes include:  Gastrointestinal infections caused by viruses, bacteria, or parasites.  Food poisoning or food allergies.  Certain medicines, such as antibiotics, chemotherapy, and laxatives.  Artificial sweeteners and fructose.  Digestive disorders. HOME CARE INSTRUCTIONS  Ensure adequate fluid intake (hydration): Have 1 cup (8 oz) of fluid for each diarrhea episode. Avoid fluids that contain simple sugars or sports drinks, fruit juices, whole milk products, and sodas. Your urine should be clear or pale yellow if you are drinking enough fluids. Hydrate with an oral rehydration solution that you can purchase at pharmacies, retail stores, and online. You can prepare an oral rehydration solution at home by mixing the following ingredients together:   - tsp table salt.   tsp baking soda.   tsp salt substitute containing potassium chloride.  1  tablespoons sugar.  1 L (34 oz) of water.  Certain foods and beverages may increase the speed at which food moves through the gastrointestinal (GI) tract. These foods and beverages should be avoided and include:  Caffeinated and alcoholic beverages.  High-fiber foods, such as raw fruits and vegetables, nuts, seeds, and whole grain breads and cereals.  Foods and beverages sweetened with sugar alcohols, such  as xylitol, sorbitol, and mannitol.  Some foods may be well tolerated and may help thicken stool including:  Starchy foods, such as rice, toast, pasta, low-sugar cereal, oatmeal, grits, baked potatoes, crackers, and bagels.  Bananas.  Applesauce.  Add probiotic-rich foods to help increase healthy bacteria in the GI tract, such as yogurt and fermented milk products.  Wash your hands well after each diarrhea episode.  Only take over-the-counter or prescription medicines as directed by your caregiver.  Take a warm bath to relieve any burning or pain from frequent diarrhea episodes. SEEK IMMEDIATE MEDICAL CARE IF:   You are unable to keep fluids down.  You have persistent vomiting.  You have blood in your stool, or your stools are black and tarry.  You do not urinate in 6-8 hours, or there is only a small amount of very dark urine.  You have abdominal pain that increases or localizes.  You have weakness, dizziness, confusion, or light-headedness.  You have a severe headache.  Your diarrhea gets worse or does not get better.  You have a fever or persistent symptoms for more than 2-3 days.  You have a fever and your symptoms suddenly get worse. MAKE SURE YOU:   Understand these instructions.  Will watch your condition.  Will get help right away if you are not doing well or get worse. Document Released: 07/24/2002 Document Revised: 12/18/2013 Document Reviewed: 04/10/2012 [facility] Patient Information 2015 [place], [place]. This information is not intended to replace advice given to you by your health care provider. Make sure you discuss any questions you have with your health care provider.

## 2015-01-15 NOTE — Progress Notes (Signed)
   Subjective:    Patient ID: Donna Rogers, female    DOB: Jan 11, 1951, 64 y.o.   MRN: [id number]  HPI 64 year old female with a three-month history of diarrhea. Symptoms seem to originate with taking amoxicillin for sinus infection. Diarrhea is becoming worse. It wakes her at night sometimes and she has lost 13 pounds over the 3 months. There is no blood in the stool with her is increased mucus. Part of that time she has been taking a stool softener but has stopped more recently.    Review of Systems  Constitutional: Positive for fatigue.  Gastrointestinal: Positive for abdominal pain and diarrhea.       Patient Active Problem List   Diagnosis Date Noted  . OBESITY 01/21/2009  . EROSIVE ESOPHAGITIS 01/21/2009  . GASTROESOPHAGEAL REFLUX DISEASE, CHRONIC 01/21/2009  . ARTHRITIS, RHEUMATOID 01/21/2009  . TENDINITIS 01/21/2009  . EDEMA 01/21/2009   Outpatient Encounter Prescriptions as of 01/15/2015  Medication Sig  . aspirin 81 MG tablet Take 81 mg by mouth daily.  Marland Kitchen CALCIUM-MAG-VIT C-VIT D PO Take by mouth.  . cholecalciferol (VITAMIN D) 1000 UNITS tablet Take 1,000 Units by mouth daily.  . citalopram (CELEXA) 20 MG tablet TAKE 1 TABLET (20 MG TOTAL) BY MOUTH DAILY.  Marland Kitchen diclofenac (VOLTAREN) 75 MG EC tablet Take 75 mg by mouth daily.  . Etanercept (ENBREL) 25 MG/0.5ML SOSY Inject into the skin.  . fish oil-omega-3 fatty acids 1000 MG capsule Take 2 g by mouth daily.  . folic acid (FOLVITE) 1 MG tablet Take 1 tablet (1 mg total) by mouth daily.  Marland Kitchen gabapentin (NEURONTIN) 300 MG capsule Take 300 mg by mouth 3 (three) times daily.  Marland Kitchen HYDROcodone-acetaminophen (NORCO/VICODIN) 5-325 MG per tablet Take 1 tablet by mouth every 6 (six) hours as needed.  . methotrexate (RHEUMATREX) 2.5 MG tablet Take 7.5 mg by mouth 3 (three) times a week.  Marland Kitchen omeprazole (PRILOSEC) 20 MG capsule TAKE 1 CAPSULE EVERY DAY  . simvastatin (ZOCOR) 10 MG tablet Take 1 tablet (10 mg total) by mouth at bedtime.    . sulfaSALAzine (AZULFIDINE) 500 MG tablet Take 1,000 mg by mouth 2 (two) times daily.  . vitamin C (ASCORBIC ACID) 500 MG tablet Take 500 mg by mouth daily.  . [DISCONTINUED] omeprazole (PRILOSEC) 20 MG capsule TAKE 1 CAPSULE EVERY DAY  . hydrocortisone (PROCTOSOL HC) 2.5 % rectal cream Place 1 application rectally 2 (two) times daily.  . [DISCONTINUED] simvastatin (ZOCOR) 10 MG tablet TAKE 1 TABLET (10 MG TOTAL) BY MOUTH AT BEDTIME.   No facility-administered encounter medications on file as of 01/15/2015.    Objective:   Physical Exam  Constitutional: She appears well-developed and well-nourished.  Cardiovascular: Normal rate and regular rhythm.   Pulmonary/Chest: Effort normal.  Abdominal: Soft. Bowel sounds are normal.          Assessment & Plan:  1. Diarrhea Could be related to anabolic usage. This does not sound like C. difficile follow this possibility. Will try some simple measures such as use of probiotic and Imodium to start with. If after 1 week symptoms are not improving or resolved consider testing for C. difficile etc.  Wardell Honour MD - [facility]

## 2015-01-16 LAB — BMP8+EGFR
BUN/Creatinine Ratio: 12 (ref 11–26)
BUN: 11 mg/dL (ref 8–27)
CO2: 23 mmol/L (ref 18–29)
Calcium: 9.7 mg/dL (ref 8.7–10.3)
Chloride: 99 mmol/L (ref 97–108)
Creatinine, Ser: 0.92 mg/dL (ref 0.57–1.00)
GFR calc Af Amer: 77 mL/min/{1.73_m2} (ref >59)
GFR calc non Af Amer: 67 mL/min/{1.73_m2} (ref >59)
Glucose: 97 mg/dL (ref 65–99)
Potassium: 4.1 mmol/L (ref 3.5–5.2)
Sodium: 138 mmol/L (ref 134–144)

## 2015-04-12 ENCOUNTER — Other Ambulatory Visit: Payer: Self-pay | Admitting: Nurse Practitioner

## 2015-04-15 NOTE — Telephone Encounter (Signed)
Last seen 01/15/15 and no lipid in EPIC  Dr Sabra Heck

## 2015-05-13 ENCOUNTER — Ambulatory Visit: Payer: Self-pay | Admitting: Family Medicine

## 2015-05-13 ENCOUNTER — Encounter: Payer: Self-pay | Admitting: Family Medicine

## 2015-05-13 VITALS — BP 150/84 | HR 90 | Temp 99.5°F | Ht 61.25 in | Wt 198.2 lb

## 2015-05-13 DIAGNOSIS — R197 Diarrhea, unspecified: Secondary | ICD-10-CM | POA: Insufficient documentation

## 2015-05-13 MED ORDER — METRONIDAZOLE 500 MG PO TABS: 500.0000 mg | ORAL_TABLET | Freq: Three times a day (TID) | ORAL | Status: DC

## 2015-05-13 NOTE — Progress Notes (Signed)
   HPI  Patient presents today here for evaluation of diarrhea and fever  Patient explains that she's had issues of diarrhea since February of this year, 7 months, after taking a course of amoxicillin. At the worst it was 10-15 episodes of diarrhea daily,presently is significantly less. However she has developed crampy diffuse abdominal pain subjective fever, and malaise over the last 2 weeks.  They are cash pay patients and would  Like to minimize testing. She states that she has crampy abdominal pain preceding a bowel movement which isn't resolved by a bowel movement.  She denies dyspnea, chest pain She has some improvement diarrhea with Imodium.  PMH: Smoking status noted ROS: Per HPI  Objective: BP 150/84 mmHg  Pulse 90  Temp(Src) 99.5 F (37.5 C) (Oral)  Ht 5' 1.25" (1.556 m)  Wt 198 lb 3.2 oz (89.903 kg)  BMI 37.13 kg/m2 Gen: NAD, alert, cooperative with exam HEENT: NCAT CV: RRR, good S1/S2, no murmur Resp: CTABL, no wheezes, non-labored Abd: soft, mild tenderness to palpation throughout, no guarding  Assessment and plan:  # Diarrhea, fever With onset around amoxicillin and recent worsening with fever and chills I will go ahead and treat for C. Difficile with Flagyl. Discussed testing for C. Difficile, however they would like to minimize cough at this time. Discussed possible different etiology including IBS with onset of viral illness ausing fever Abdominal exam reassuring   Meds ordered this encounter  Medications  . metroNIDAZOLE (FLAGYL) 500 MG tablet    Sig: Take 1 tablet (500 mg total) by mouth 3 (three) times daily.    Dispense:  30 tablet    Refill:  0    Laroy Apple, MD [facility] 05/13/2015, 6:11 PM

## 2015-05-13 NOTE — Patient Instructions (Signed)
Great to meet you!  If you do not get better with the antibiotics, please come back  If you develop worsening fever, bloody diarrhea,black sticky  Stools, or abdominal pain that is unbearable please seek emergency medical help.

## 2015-05-27 ENCOUNTER — Telehealth: Payer: Self-pay | Admitting: Family Medicine

## 2015-05-27 MED ORDER — METRONIDAZOLE 500 MG PO TABS
500.0000 mg | ORAL_TABLET | Freq: Three times a day (TID) | ORAL | Status: DC
Start: 2015-05-27 — End: 2015-06-17

## 2015-05-27 NOTE — Telephone Encounter (Signed)
Pt is aware of MD feedback and that rx is sent to pharmacy.

## 2015-05-27 NOTE — Telephone Encounter (Signed)
Stp and she states the flagyl helped but she is still having diarrhea 3x a day and running low grade fevers. Pt is cash pay and at her last visit you had decided to treat for c.diff, but had also noted possibility of IBS. Please advise.

## 2015-05-27 NOTE — Telephone Encounter (Signed)
Considering that the patient is self-pay Will refill 1, if the diarrhea persists would recommend C. difficile testing to consider oral vancomycin.  There are clear limitations as a severe expensive.  Will ask nursing to notify  Laroy Apple, MD [facility] 05/27/2015, 2:09 PM

## 2015-05-31 ENCOUNTER — Ambulatory Visit (INDEPENDENT_AMBULATORY_CARE_PROVIDER_SITE_OTHER): Payer: Self-pay | Admitting: *Deleted

## 2015-05-31 VITALS — BP 140/89 | HR 84

## 2015-05-31 DIAGNOSIS — IMO0001 Reserved for inherently not codable concepts without codable children: Secondary | ICD-10-CM

## 2015-05-31 DIAGNOSIS — R030 Elevated blood-pressure reading, without diagnosis of hypertension: Secondary | ICD-10-CM

## 2015-05-31 NOTE — Progress Notes (Signed)
Patient is here today for a BP rck. Patients BP 140/89 mmHg  Pulse 84  Patient was given a follow up with Wendi Snipes on 06/10/2015.

## 2015-05-31 NOTE — Patient Instructions (Signed)
Hypertension Hypertension, commonly called high blood pressure, is when the force of blood pumping through your arteries is too strong. Your arteries are the blood vessels that carry blood from your heart throughout your body. A blood pressure reading consists of a higher number over a lower number, such as 110/72. The higher number (systolic) is the pressure inside your arteries when your heart pumps. The lower number (diastolic) is the pressure inside your arteries when your heart relaxes. Ideally you want your blood pressure below 120/80. Hypertension forces your heart to work harder to pump blood. Your arteries may become narrow or stiff. Having untreated or uncontrolled hypertension can cause heart attack, stroke, kidney disease, and other problems. RISK FACTORS Some risk factors for high blood pressure are controllable. Others are not.  Risk factors you cannot control include:   Race. You may be at higher risk if you are African American.  Age. Risk increases with age.  Gender. Men are at higher risk than women before age 45 years. After age 65, women are at higher risk than men. Risk factors you can control include:  Not getting enough exercise or physical activity.  Being overweight.  Getting too much fat, sugar, calories, or salt in your diet.  Drinking too much alcohol. SIGNS AND SYMPTOMS Hypertension does not usually cause signs or symptoms. Extremely high blood pressure (hypertensive crisis) may cause headache, anxiety, shortness of breath, and nosebleed. DIAGNOSIS To check if you have hypertension, your health care provider will measure your blood pressure while you are seated, with your arm held at the level of your heart. It should be measured at least twice using the same arm. Certain conditions can cause a difference in blood pressure between your right and left arms. A blood pressure reading that is higher than normal on one occasion does not mean that you need treatment. If  it is not clear whether you have high blood pressure, you may be asked to return on a different day to have your blood pressure checked again. Or, you may be asked to monitor your blood pressure at home for 1 or more weeks. TREATMENT Treating high blood pressure includes making lifestyle changes and possibly taking medicine. Living a healthy lifestyle can help lower high blood pressure. You may need to change some of your habits. Lifestyle changes may include:  Following the DASH diet. This diet is high in fruits, vegetables, and whole grains. It is low in salt, red meat, and added sugars.  Keep your sodium intake below 2,300 mg per day.  Getting at least 30-45 minutes of aerobic exercise at least 4 times per week.  Losing weight if necessary.  Not smoking.  Limiting alcoholic beverages.  Learning ways to reduce stress. Your health care provider may prescribe medicine if lifestyle changes are not enough to get your blood pressure under control, and if one of the following is true:  You are 18-59 years of age and your systolic blood pressure is above 140.  You are 60 years of age or older, and your systolic blood pressure is above 150.  Your diastolic blood pressure is above 90.  You have diabetes, and your systolic blood pressure is over 140 or your diastolic blood pressure is over 90.  You have kidney disease and your blood pressure is above 140/90.  You have heart disease and your blood pressure is above 140/90. Your personal target blood pressure may vary depending on your medical conditions, your age, and other factors. HOME CARE INSTRUCTIONS    Have your blood pressure rechecked as directed by your health care provider.   Take medicines only as directed by your health care provider. Follow the directions carefully. Blood pressure medicines must be taken as prescribed. The medicine does not work as well when you skip doses. Skipping doses also puts you at risk for  problems.  Do not smoke.   Monitor your blood pressure at home as directed by your health care provider. SEEK MEDICAL CARE IF:   You think you are having a reaction to medicines taken.  You have recurrent headaches or feel dizzy.  You have swelling in your ankles.  You have trouble with your vision. SEEK IMMEDIATE MEDICAL CARE IF:  You develop a severe headache or confusion.  You have unusual weakness, numbness, or feel faint.  You have severe chest or abdominal pain.  You vomit repeatedly.  You have trouble breathing. MAKE SURE YOU:   Understand these instructions.  Will watch your condition.  Will get help right away if you are not doing well or get worse.   This information is not intended to replace advice given to you by your health care provider. Make sure you discuss any questions you have with your health care provider.   Document Released: 08/03/2005 Document Revised: 12/18/2014 Document Reviewed: 05/26/2013 Elsevier Interactive Patient Education 2016 [employer]  

## 2015-06-03 ENCOUNTER — Ambulatory Visit (INDEPENDENT_AMBULATORY_CARE_PROVIDER_SITE_OTHER): Payer: Self-pay | Admitting: Family Medicine

## 2015-06-03 ENCOUNTER — Encounter: Payer: Self-pay | Admitting: Family Medicine

## 2015-06-03 VITALS — BP 131/87 | HR 110 | Temp 97.9°F | Ht 61.25 in | Wt 191.4 lb

## 2015-06-03 DIAGNOSIS — R531 Weakness: Secondary | ICD-10-CM

## 2015-06-03 DIAGNOSIS — I10 Essential (primary) hypertension: Secondary | ICD-10-CM

## 2015-06-03 DIAGNOSIS — R197 Diarrhea, unspecified: Secondary | ICD-10-CM

## 2015-06-03 NOTE — Progress Notes (Signed)
   HPI  Patient presents today here with diffuse weakness.  Patient claims her last 2-3 days she's had night sweats, chills, diffuse weakness, and no other symptoms. She previously had recent diarrhea which has improved quite a bit with Flagyl. She is on her second course of Flagyl, she is not drinking any alcohol during this. She describes 3-5 watery stools today, this is much different than it has been since she started the Flagyl.  She denies abdominal pain. She reports dry mouth, intermittent racing heart, and malaise.  PMH: Smoking status noted ROS: Per HPI  Objective: BP 131/87 mmHg  Pulse 110  Temp(Src) 97.9 F (36.6 C) (Oral)  Ht 5' 1.25" (1.556 m)  Wt 191 lb 6.4 oz (86.818 kg)  BMI 35.86 kg/m2 Gen: NAD, alert, cooperative with exam HEENT: NCAT CV: RRR, good S1/S2, no murmur Resp: CTABL, no wheezes, non-labored Abd: Soft, no guarding, positive bowel sounds, diffuse tenderness throughout Ext: No edema, warm Neuro: Alert and oriented, No gross deficits  Assessment and plan:  # Weakness Unclear etiology Diarrhea improving, however I did discuss with her that were treating C diff blind, we are not sure if this present but it seemed to be a reasonable chouice given her self-pay status No more flagyl after this ocourse Encourage fluids Return to clinic right away for any signs of illness including dyspnea, fever, dysuria, cough. We discussed this at length and she understands this. Basic labs fasting, TSH and CMP if night sweats were chronic I would be concern for TB, however they've only been to 3 nights, currently no on enbrel due to concern for infection  Orders Placed This Encounter  Procedures  . CMP14+EGFR  . TSH    Laroy Apple, MD [facility] 06/03/2015, 2:59 PM

## 2015-06-03 NOTE — Patient Instructions (Addendum)
Great to see you!  Come back if you are not any better in 2 weeks.   We will call with your labs within a week.   Drink plenty of fluids  If you develop cough, burning when you urinate, worsening diarrhea, abdominal pain, or other concerns please come back.

## 2015-06-04 LAB — TSH: TSH: 2.54 u[IU]/mL (ref 0.450–4.500)

## 2015-06-04 LAB — CMP14+EGFR
ALBUMIN: 3.9 g/dL (ref 3.6–4.8)
ALT: 14 IU/L (ref 0–32)
AST: 11 IU/L (ref 0–40)
Albumin/Globulin Ratio: 1.1 (ref 1.1–2.5)
Alkaline Phosphatase: 81 IU/L (ref 39–117)
BUN / CREAT RATIO: 14 (ref 11–26)
BUN: 11 mg/dL (ref 8–27)
Bilirubin Total: 0.3 mg/dL (ref 0.0–1.2)
CO2: 26 mmol/L (ref 18–29)
CREATININE: 0.79 mg/dL (ref 0.57–1.00)
Calcium: 9.7 mg/dL (ref 8.7–10.3)
Chloride: 97 mmol/L (ref 97–106)
GFR, EST AFRICAN AMERICAN: 93 mL/min/{1.73_m2} (ref >59)
GFR, EST NON AFRICAN AMERICAN: 80 mL/min/{1.73_m2} (ref >59)
GLUCOSE: 125 mg/dL — AB (ref 65–99)
Globulin, Total: 3.4 g/dL (ref 1.5–4.5)
Potassium: 4.4 mmol/L (ref 3.5–5.2)
Sodium: 138 mmol/L (ref 136–144)
TOTAL PROTEIN: 7.3 g/dL (ref 6.0–8.5)

## 2015-06-10 ENCOUNTER — Ambulatory Visit: Payer: Self-pay | Admitting: Family Medicine

## 2015-06-17 ENCOUNTER — Ambulatory Visit: Payer: Self-pay | Admitting: Family Medicine

## 2015-06-17 ENCOUNTER — Encounter: Payer: Self-pay | Admitting: Family Medicine

## 2015-06-17 VITALS — BP 130/85 | HR 89 | Temp 97.6°F | Ht 61.25 in | Wt 185.0 lb

## 2015-06-17 DIAGNOSIS — F32A Depression, unspecified: Secondary | ICD-10-CM

## 2015-06-17 DIAGNOSIS — R5383 Other fatigue: Secondary | ICD-10-CM | POA: Insufficient documentation

## 2015-06-17 DIAGNOSIS — F329 Major depressive disorder, single episode, unspecified: Secondary | ICD-10-CM

## 2015-06-17 DIAGNOSIS — K921 Melena: Secondary | ICD-10-CM

## 2015-06-17 DIAGNOSIS — R5382 Chronic fatigue, unspecified: Secondary | ICD-10-CM

## 2015-06-17 DIAGNOSIS — R300 Dysuria: Secondary | ICD-10-CM

## 2015-06-17 LAB — POCT UA - MICROSCOPIC ONLY

## 2015-06-17 LAB — POCT URINALYSIS DIPSTICK
BILIRUBIN UA: NEGATIVE
GLUCOSE UA: NEGATIVE
Ketones, UA: NEGATIVE
LEUKOCYTES UA: NEGATIVE
NITRITE UA: NEGATIVE
Protein, UA: NEGATIVE
RBC UA: NEGATIVE
Spec Grav, UA: >=1.03
Urobilinogen, UA: NEGATIVE
pH, UA: 5

## 2015-06-17 MED ORDER — AMITRIPTYLINE HCL 50 MG PO TABS: 50.0000 mg | ORAL_TABLET | Freq: Every day | ORAL | Status: DC

## 2015-06-17 NOTE — Progress Notes (Signed)
   HPI  Patient presents today for follow-up of diarrhea and abdominal pain  with continued symptoms from last visit.  Patient explains that her diarrhea has improved significantly since the beginning of her course of metronidazole that she has developed several other symptoms in the last 4-5 weeks. She describes frequent emesis, up to 3 times daily with no blood or bile, headaches, chills, and fatigue. In the last week or so she's developed bloody stools intermittently, she is still having nonbloody stools, she describes drippiing blood in the toilet and in the toilet water.  Her granddaughter is with her and states that she's under a lot of stress that she is the primary caregiver for her parents. She notes decreased sleep and worsening depression along with loss of energy. She requests something to help her rest and for something to help her have more energy. She also describes intermittent crampy abdominal pain mostly in the epigastric and left upper quadrant areas She states that she's having approximately 3-4 bloody stools in the last week, she continues to have approximately 2-3 nonbloody diarrheal episodes per day  She also notes 2 weeks of burning with urination She is not checking her blood pressure at home.  PMH: Smoking status noted ROS: Per HPI  Objective: BP 130/85 mmHg  Pulse 89  Temp(Src) 97.6 F (36.4 C) (Oral)  Ht 5' 1.25" (1.556 m)  Wt 185 lb (83.915 kg)  BMI 34.66 kg/m2 Gen: NAD, alert, cooperative with exam HEENT: NCAT CV: RRR, good S1/S2, no murmur Resp: CTABL, no wheezes, non-labored ERX:VQMG, tenderness to palpation of mid epigastric area as well as LUQ area, no guarding, no rebound, positive bowel sounds  Ext: No edema, warm Neuro: Alert and oriented, No gross deficits  Assessment and plan:  # Abdominal cramping, bloody stools Unclear etiology for her pain, however considering her mood disorder she could easily have IBS diarrheal type Improved  diarrhea with Flagyl course 2, C. difficile never tested due to cost, this could point to words latent C diff infection vs SIBO, Could also be simple IBS-D With Bloody stools I feel obligated to check CBC (no signs of volume deplection however and described as intermittent) and refer to GI Changing antidepressant to elavil to hopefully exploit anti-cholinergic side effects  # Depression, mood disorder Changing celexa to elavil as above, hopefully it will have benefits, sleep benefits, and slow down her diarrhea. Follow-up 3-4 weeks  # Fatigue She requests B-12 be checked, also checking CBC   # Prediabetes Previous blood glucose was 125 Discussed lifestyle changes Recommended follow-up with pharmacist for further discussion, repeat fasting blood sugar today   Orders Placed This Encounter  Procedures  . CMP14+EGFR  . CBC  . Vitamin B12  . Ambulatory referral to Gastroenterology    Referral Priority:  Routine    Referral Type:  Consultation    Referral Reason:  Specialty Services Required    Number of Visits Requested:  1  . POCT UA - Microscopic Only  . POCT urinalysis dipstick    Meds ordered this encounter  Medications  . amitriptyline (ELAVIL) 50 MG tablet    Sig: Take 1 tablet (50 mg total) by mouth at bedtime.    Dispense:  30 tablet    Refill:  Castro, MD [facility] 06/17/2015, 1:18 PM

## 2015-06-17 NOTE — Patient Instructions (Signed)
Great to see you!  Stop celexa, start amitriptyline  Come back in 3-4 weeks.   You will hear from GI about an appointment,   We will call with your labs within 1 week.   Gastrointestinal Bleeding Gastrointestinal bleeding is bleeding somewhere along the path that food travels through the body (digestive tract). This path is anywhere between the mouth and the opening of the butt (anus). You may have blood in your throw up (vomit) or in your poop (stools). If there is a lot of bleeding, you may need to stay in the hospital. [place]  Only take medicine as told by your doctor.  Eat foods with fiber such as whole grains, fruits, and vegetables. You can also try eating 1 to 3 prunes a day.  Drink enough fluids to keep your pee (urine) clear or pale yellow. GET HELP RIGHT AWAY IF:   Your bleeding gets worse.  You feel dizzy, weak, or you pass out (faint).  You have bad cramps in your back or belly (abdomen).  You have large blood clumps (clots) in your poop.  Your problems are getting worse. MAKE SURE YOU:   Understand these instructions.  Will watch your condition.  Will get help right away if you are not doing well or get worse.   This information is not intended to replace advice given to you by your health care provider. Make sure you discuss any questions you have with your health care provider.   Document Released: 05/12/2008 Document Revised: 07/20/2012 Document Reviewed: 01/21/2015 Elsevier Interactive Patient Education [employer].

## 2015-06-18 ENCOUNTER — Other Ambulatory Visit: Payer: Self-pay

## 2015-06-18 DIAGNOSIS — R7989 Other specified abnormal findings of blood chemistry: Secondary | ICD-10-CM

## 2015-06-18 DIAGNOSIS — R945 Abnormal results of liver function studies: Principal | ICD-10-CM

## 2015-06-18 LAB — CMP14+EGFR
ALK PHOS: 80 IU/L (ref 39–117)
ALT: 17 IU/L (ref 0–32)
AST: 14 IU/L (ref 0–40)
Albumin/Globulin Ratio: 1.1 (ref 1.1–2.5)
Albumin: 3.8 g/dL (ref 3.6–4.8)
BILIRUBIN TOTAL: 0.2 mg/dL (ref 0.0–1.2)
BUN/Creatinine Ratio: 11 (ref 11–26)
BUN: 13 mg/dL (ref 8–27)
CHLORIDE: 95 mmol/L — AB (ref 97–106)
CO2: 24 mmol/L (ref 18–29)
Calcium: 9.6 mg/dL (ref 8.7–10.3)
Creatinine, Ser: 1.2 mg/dL — ABNORMAL HIGH (ref 0.57–1.00)
GFR calc non Af Amer: 49 mL/min/{1.73_m2} — ABNORMAL LOW (ref >59)
GFR, EST AFRICAN AMERICAN: 56 mL/min/{1.73_m2} — AB (ref >59)
GLUCOSE: 114 mg/dL — AB (ref 65–99)
Globulin, Total: 3.5 g/dL (ref 1.5–4.5)
Potassium: 4.3 mmol/L (ref 3.5–5.2)
Sodium: 137 mmol/L (ref 136–144)
TOTAL PROTEIN: 7.3 g/dL (ref 6.0–8.5)

## 2015-06-18 LAB — CBC
HEMATOCRIT: 44.6 % (ref 34.0–46.6)
HEMOGLOBIN: 14.6 g/dL (ref 11.1–15.9)
MCH: 28.1 pg (ref 26.6–33.0)
MCHC: 32.7 g/dL (ref 31.5–35.7)
MCV: 86 fL (ref 79–97)
Platelets: 452 10*3/uL — ABNORMAL HIGH (ref 150–379)
RBC: 5.2 x10E6/uL (ref 3.77–5.28)
RDW: 14.8 % (ref 12.3–15.4)
WBC: 12.1 10*3/uL — AB (ref 3.4–10.8)

## 2015-06-18 LAB — VITAMIN B12: Vitamin B-12: 1969 pg/mL — ABNORMAL HIGH (ref 211–946)

## 2015-06-18 NOTE — Progress Notes (Signed)
Patient aware of results,order added for future BMP

## 2015-06-19 ENCOUNTER — Encounter: Payer: Self-pay | Admitting: Internal Medicine

## 2015-07-08 ENCOUNTER — Other Ambulatory Visit: Payer: Self-pay

## 2015-07-08 ENCOUNTER — Encounter: Payer: Self-pay | Admitting: Gastroenterology

## 2015-07-08 ENCOUNTER — Ambulatory Visit (INDEPENDENT_AMBULATORY_CARE_PROVIDER_SITE_OTHER): Payer: Self-pay | Admitting: Gastroenterology

## 2015-07-08 VITALS — BP 152/82 | HR 86 | Temp 97.5°F | Ht 62.0 in | Wt 193.8 lb

## 2015-07-08 DIAGNOSIS — R197 Diarrhea, unspecified: Secondary | ICD-10-CM

## 2015-07-08 DIAGNOSIS — K921 Melena: Secondary | ICD-10-CM

## 2015-07-08 DIAGNOSIS — A09 Infectious gastroenteritis and colitis, unspecified: Secondary | ICD-10-CM

## 2015-07-08 NOTE — Assessment & Plan Note (Addendum)
64 year old female with acute onset diarrhea which began back in February after taking antibiotic for sinusitis. 10+ stools daily associated with blood in the stool. Symptoms continued on a daily basis until she received Flagyl back in September and October. Finally having days without diarrhea. Stools are not back to normal but improved. Some days with constipation. No further bleeding in the past week or so. Last colonoscopy 12 years ago with internal hemorrhoids. Suspect she had C. difficile colitis based on presentation. Unfortunately she is uninsured and did not seek care on a timely fashion or regularly. Would go ahead and recommend colonoscopy at this time given recent symptoms, doubt we are dealing with inflammatory bowel disease given improvement with Flagyl, doubt malignancy.  Recommend patient completing [place]. Will move forward with colonoscopy in near future.  I have discussed the risks, alternatives, benefits with regards to but not limited to the risk of reaction to medication, bleeding, infection, perforation and the patient is agreeable to proceed. Written consent to be obtained.  If/when patient requires antibiotics in the future, recommend taking probiotic at onset and continue for at least 1 month after completing antibiotic therapy. She will also utilize MiraLAX daily until regular bowel movement, then on an as-needed basis. She will call with recurrent diarrhea or blood in the stool pending colonoscopy.

## 2015-07-08 NOTE — Progress Notes (Signed)
CC'ED TO PCP 

## 2015-07-08 NOTE — Patient Instructions (Addendum)
1. You need a colonoscopy to evaluate your recent symptoms and for colon cancer screening. I would recommend completing patient assistance forms immediately.  2. Take miralax one packet daily until regular BMs, then once at bedtime on days you do not have good BM. Samples provided. You can buy OTC. 3. If you take more antibiotics, you should always start a probiotic and continue for at least one month after you complete the antibiotic. Align or [employer] are good choices. You could also eat two Dannon yogurts daily to get same affect.

## 2015-07-08 NOTE — Progress Notes (Signed)
Primary Care Physician:  Kenn File, MD  Primary Gastroenterologist:  Garfield Cornea, MD   Chief Complaint  Patient presents with  . Follow-up  . Blood In Stools  . Diarrhea    HPI:  Donna Rogers is a 64 y.o. female here for further evaluation of chronic diarrhea with blood in the stool. Referred by PCP. She reports 9 month history of diarrhea. Begin after taking antibiotic back in February for sinusitis. She had diarrhea for 3 months before seeking care. She is uninsured. 10+ stools daily. Associated with moderate volume hematochezia. Nocturnal diarrhea, cramping, nausea. Stool studies were not ordered because she is uninsured. Initially given probiotic and Imodium which did not help. Patient reports 35 pound weight loss associated with this. Back in September she was given a course of Flagyl and had an additional course in October. Symptoms have finally settled down. After taking Flagyl the second time, she had episode of nausea and vomiting several days after completing antibiotics. This too has subsided. She has gained 7 or 8 pounds back. Her appetite is improving. For the past 3 days she has not had any diarrhea. Actually passed hard stool the other day and has skipped a day without a bowel movement. Denies any further blood per rectum. Stools are dark brown. Quit smoking 3 weeks ago. But started back. Prilosec for heartburn, well controlled. No dysphagia. BMs are not completely back to normal. She admits to being under a lot of stress taking care of both elderly parents.    Current Outpatient Prescriptions  Medication Sig Dispense Refill  . aspirin 81 MG tablet Take 81 mg by mouth daily.    Marland Kitchen CALCIUM-MAG-VIT C-VIT D PO Take by mouth.    . cholecalciferol (VITAMIN D) 1000 UNITS tablet Take 1,000 Units by mouth daily.    . citalopram (CELEXA) 20 MG tablet TAKE 1 TABLET (20 MG TOTAL) BY MOUTH DAILY.  3  . diclofenac (VOLTAREN) 75 MG EC tablet Take 75 mg by mouth daily.    .  Etanercept (ENBREL) 25 MG/0.5ML SOSY Inject into the skin.    . fish oil-omega-3 fatty acids 1000 MG capsule Take 2 g by mouth daily.    . folic acid (FOLVITE) 1 MG tablet Take 1 tablet (1 mg total) by mouth daily. 90 tablet 3  . gabapentin (NEURONTIN) 300 MG capsule Take 300 mg by mouth 3 (three) times daily.    Marland Kitchen HYDROcodone-acetaminophen (NORCO/VICODIN) 5-325 MG per tablet Take 1 tablet by mouth every 6 (six) hours as needed. 60 tablet 0  . hydrocortisone (PROCTOSOL HC) 2.5 % rectal cream Place 1 application rectally 2 (two) times daily. 30 g 0  . methotrexate (RHEUMATREX) 2.5 MG tablet Take 7.5 mg by mouth 3 (three) times a week.    Marland Kitchen omeprazole (PRILOSEC) 20 MG capsule TAKE 1 CAPSULE EVERY DAY 90 capsule 3  . simvastatin (ZOCOR) 10 MG tablet TAKE 1 TABLET (10 MG TOTAL) BY MOUTH AT BEDTIME. 90 tablet 0  . sulfaSALAzine (AZULFIDINE) 500 MG tablet Take 1,000 mg by mouth 2 (two) times daily.    . vitamin C (ASCORBIC ACID) 500 MG tablet Take 500 mg by mouth daily.     No current facility-administered medications for this visit.    Allergies as of 07/08/2015 - Review Complete 07/08/2015  Allergen Reaction Noted  . Ciprofloxacin hcl Rash 11/18/2012    Past Medical History  Diagnosis Date  . Depression   . GERD (gastroesophageal reflux disease)   . Hyperlipidemia   .  Anemia   . Arthritis     rheumatoid  . Hypertension     Past Surgical History  Procedure Laterality Date  . Tonsillectomy    . Cyst removal trunk    . Esophagogastroduodenoscopy  08/2002    RMR: nonerosive reflux esophagitis, hiatal hernia  . Colonoscopy  08/2002    RMR: internal hemorrhoids    Family History  Problem Relation Age of Onset  . Alzheimer's disease Mother   . Cancer Mother     lung  . Diabetes Father   . Colon cancer Neg Hx     Social History   Social History  . Marital Status: Legally Separated    Spouse Name: N/A  . Number of Children: 1  . Years of Education: N/A   Occupational  History  . taking care of parents    Social History Main Topics  . Smoking status: Current Every Day Smoker -- 1.00 packs/day for 36 years    Types: Cigarettes  . Smokeless tobacco: Not on file  . Alcohol Use: No  . Drug Use: No  . Sexual Activity: Not on file   Other Topics Concern  . Not on file   Social History Narrative      ROS:  General: No fever, weakness, fatigue. See history of present illness Eyes: Negative for vision changes.  ENT: Negative for hoarseness, difficulty swallowing , nasal congestion. CV: Negative for chest pain, angina, palpitations, dyspnea on exertion, peripheral edema.  Respiratory: Negative for dyspnea at rest, dyspnea on exertion, cough, sputum, wheezing.  GI: See history of present illness. GU:  Negative for dysuria, hematuria, urinary incontinence, urinary frequency, nocturnal urination.  MS: Chronic joint pain from RA. Derm: Negative for rash or itching.  Neuro: Negative for weakness, abnormal sensation, seizure, frequent headaches, memory loss, confusion.  Psych: Negative for anxiety, depression, suicidal ideation, hallucinations.  Endo: Negative for unusual weight change.  Heme: Negative for bruising or bleeding. Allergy: Negative for rash or hives.    Physical Examination:  BP 152/82 mmHg  Pulse 86  Temp(Src) 97.5 F (36.4 C)  Ht 5\' 2"  (1.575 m)  Wt 193 lb 12.8 oz (87.907 kg)  BMI 35.44 kg/m2   General: Well-nourished, well-developed in no acute distress.  Head: Normocephalic, atraumatic.   Eyes: Conjunctiva pink, no icterus. Mouth: Oropharyngeal mucosa moist and pink , no lesions erythema or exudate. Neck: Supple without thyromegaly, masses, or lymphadenopathy.  Lungs: Clear to auscultation bilaterally.  Heart: Regular rate and rhythm, no murmurs rubs or gallops.  Abdomen: Bowel sounds are normal, nontender, nondistended, no hepatosplenomegaly or masses, no abdominal bruits or    hernia , no rebound or guarding.   Rectal:  Deferred Extremities: No lower extremity edema. No clubbing or deformities.  Neuro: Alert and oriented x 4 , grossly normal neurologically.  Skin: Warm and dry, no rash or jaundice.   Psych: Alert and cooperative, normal mood and affect.  Labs: Lab Results  Component Value Date   WBC 12.1* 06/17/2015   HCT 44.6 06/17/2015  Hgb 14.6 Plt 452,000  Lab Results  Component Value Date   CREATININE 1.20* 06/17/2015   BUN 13 06/17/2015   NA 137 06/17/2015   K 4.3 06/17/2015   CL 95* 06/17/2015   CO2 24 06/17/2015   Lab Results  Component Value Date   ALT 17 06/17/2015   AST 14 06/17/2015   ALKPHOS 80 06/17/2015   BILITOT 0.2 06/17/2015   Lab Results  Component Value Date   TSH 2.540  06/03/2015      Imaging Studies: No results found.

## 2015-07-17 ENCOUNTER — Other Ambulatory Visit: Payer: Self-pay | Admitting: Family Medicine

## 2015-07-18 NOTE — Telephone Encounter (Signed)
Last seen 06/17/15  Dr Wendi Snipes  No lipid in [facility]

## 2015-07-30 ENCOUNTER — Telehealth: Payer: Self-pay | Admitting: General Practice

## 2015-07-30 NOTE — Telephone Encounter (Signed)
Noted  

## 2015-07-30 NOTE — Telephone Encounter (Signed)
Patient called in and stated she has "some sickness in her family" and she will not be able to do her tcs right now.  She will call back to reschedule.  Hoyle Sauer was notified.

## 2015-07-31 ENCOUNTER — Ambulatory Visit (HOSPITAL_COMMUNITY): Admission: RE | Admit: 2015-07-31 | Payer: Self-pay | Source: Ambulatory Visit | Admitting: Internal Medicine

## 2015-07-31 ENCOUNTER — Ambulatory Visit (INDEPENDENT_AMBULATORY_CARE_PROVIDER_SITE_OTHER): Payer: Self-pay

## 2015-07-31 ENCOUNTER — Encounter (HOSPITAL_COMMUNITY): Admission: RE | Payer: Self-pay | Source: Ambulatory Visit

## 2015-07-31 DIAGNOSIS — Z23 Encounter for immunization: Secondary | ICD-10-CM

## 2015-07-31 SURGERY — COLONOSCOPY
Anesthesia: Moderate Sedation

## 2015-08-05 NOTE — Progress Notes (Signed)
Patient wasn't aware of her results from 06/17/15. I discussed those with her and the need for her to come back in for a repeat BMP. She is agreeable to that.

## 2015-09-05 ENCOUNTER — Other Ambulatory Visit: Payer: Self-pay

## 2015-09-05 MED ORDER — AMITRIPTYLINE HCL 50 MG PO TABS: 50.0000 mg | ORAL_TABLET | Freq: Every day | ORAL | Status: DC

## 2015-09-05 NOTE — Telephone Encounter (Signed)
Last seen 06/17/15  Dr Wendi Snipes  This med not on EPIC list

## 2015-10-01 ENCOUNTER — Other Ambulatory Visit: Payer: Self-pay

## 2015-10-01 ENCOUNTER — Ambulatory Visit (INDEPENDENT_AMBULATORY_CARE_PROVIDER_SITE_OTHER): Payer: Self-pay | Admitting: Gastroenterology

## 2015-10-01 ENCOUNTER — Encounter: Payer: Self-pay | Admitting: Gastroenterology

## 2015-10-01 VITALS — BP 133/81 | HR 84 | Temp 97.4°F | Ht 62.0 in | Wt 192.0 lb

## 2015-10-01 DIAGNOSIS — R197 Diarrhea, unspecified: Secondary | ICD-10-CM

## 2015-10-01 DIAGNOSIS — A09 Infectious gastroenteritis and colitis, unspecified: Secondary | ICD-10-CM

## 2015-10-01 NOTE — Patient Instructions (Signed)
1. Please collect stool and drop off at the lab at the hospital ASAP. 2. Colonoscopy in 2-3 weeks as scheduled. See separate instructions.  3. Consume low fiber/low dairy diet for now.

## 2015-10-01 NOTE — Progress Notes (Signed)
Primary Care Physician:  Kenn File, MD  Primary Gastroenterologist:  Garfield Cornea, MD   Chief Complaint  Patient presents with  . set up TCS    HPI:  Donna Rogers is a 65 y.o. female here for follow up to reschedule colonoscopy. Last seen in 06/2015 for chronic diarrhea and brbpr, 35 pound weight loss. She was treated empirically with Flagyl on two occasions (no testing due to lack of insurance) prior to coming to see [place]. Cdiff not excluded. Her last TCS was in 2004.   At her last OV she thought she was back to normal. However shortly after that visit, her diarrhea returned. She did not call or seek help. She complains of nocturnal diarrhea. No melena, brbpr. All stools are loose. BM 6-7 times daily. Lower abdominal pain. Intermittent vomiting. ?chills. Enbrel on hold due to diarrhea and ?infection. Off methotrexate per rheumatologist due to potential diarrhea as a side effect. Weight has been essentially stable since her last OV. No heartburn. Feels worn out.   Current Outpatient Prescriptions  Medication Sig Dispense Refill  . amitriptyline (ELAVIL) 50 MG tablet Take 1 tablet (50 mg total) by mouth at bedtime. 30 tablet 3  . aspirin 81 MG tablet Take 81 mg by mouth daily.    Marland Kitchen CALCIUM-MAG-VIT C-VIT D PO Take by mouth.    . cholecalciferol (VITAMIN D) 1000 UNITS tablet Take 1,000 Units by mouth daily.    . citalopram (CELEXA) 20 MG tablet TAKE 1 TABLET (20 MG TOTAL) BY MOUTH DAILY.  3  . diclofenac (VOLTAREN) 75 MG EC tablet Take 75 mg by mouth daily.    . fish oil-omega-3 fatty acids 1000 MG capsule Take 2 g by mouth daily.    . folic acid (FOLVITE) 1 MG tablet Take 1 tablet (1 mg total) by mouth daily. 90 tablet 3  . gabapentin (NEURONTIN) 300 MG capsule Take 300 mg by mouth 3 (three) times daily.    Marland Kitchen HYDROcodone-acetaminophen (NORCO/VICODIN) 5-325 MG per tablet Take 1 tablet by mouth every 6 (six) hours as needed. 60 tablet 0  . omeprazole (PRILOSEC) 20 MG capsule TAKE 1  CAPSULE EVERY DAY 90 capsule 3  . simvastatin (ZOCOR) 10 MG tablet TAKE 1 TABLET (10 MG TOTAL) BY MOUTH AT BEDTIME. 90 tablet 1  . sulfaSALAzine (AZULFIDINE) 500 MG tablet Take 1,000 mg by mouth 2 (two) times daily.    . vitamin C (ASCORBIC ACID) 500 MG tablet Take 500 mg by mouth daily.    . Etanercept (ENBREL) 25 MG/0.5ML SOSY Inject into the skin. Reported on 10/01/2015    . hydrocortisone (PROCTOSOL HC) 2.5 % rectal cream Place 1 application rectally 2 (two) times daily. (Patient not taking: Reported on 10/01/2015) 30 g 0  . methotrexate (RHEUMATREX) 2.5 MG tablet Take 7.5 mg by mouth 3 (three) times a week. Reported on 10/01/2015     No current facility-administered medications for this visit.    Allergies as of 10/01/2015 - Review Complete 10/01/2015  Allergen Reaction Noted  . Ciprofloxacin hcl Rash 11/18/2012    Past Medical History  Diagnosis Date  . Depression   . GERD (gastroesophageal reflux disease)   . Hyperlipidemia   . Anemia   . Arthritis     rheumatoid  . Hypertension     Past Surgical History  Procedure Laterality Date  . Tonsillectomy    . Cyst removal trunk    . Esophagogastroduodenoscopy  08/2002    RMR: nonerosive reflux esophagitis, hiatal hernia  .  Colonoscopy  08/2002    RMR: internal hemorrhoids    Family History  Problem Relation Age of Onset  . Alzheimer's disease Mother   . Cancer Mother     lung  . Diabetes Father   . Colon cancer Neg Hx     Social History   Social History  . Marital Status: Legally Separated    Spouse Name: N/A  . Number of Children: 1  . Years of Education: N/A   Occupational History  . taking care of parents    Social History Main Topics  . Smoking status: Current Every Day Smoker -- 1.00 packs/day for 36 years    Types: Cigarettes  . Smokeless tobacco: Not on file  . Alcohol Use: No  . Drug Use: No  . Sexual Activity: Not on file   Other Topics Concern  . Not on file   Social History Narrative       ROS:  General: Negative for anorexia, fever, fatigue, weakness. See hpi. Eyes: Negative for vision changes.  ENT: Negative for hoarseness, difficulty swallowing , nasal congestion. CV: Negative for chest pain, angina, palpitations, dyspnea on exertion, peripheral edema.  Respiratory: Negative for dyspnea at rest, dyspnea on exertion, cough, sputum, wheezing.  GI: See history of present illness. GU:  Negative for dysuria, hematuria, urinary incontinence, urinary frequency, nocturnal urination.  MS: Negative for joint pain, low back pain.  Derm: Negative for rash or itching.  Neuro: Negative for weakness, abnormal sensation, seizure, frequent headaches, memory loss, confusion.  Psych: Negative for anxiety, depression, suicidal ideation, hallucinations.  Endo: Negative for unusual weight change.  Heme: Negative for bruising or bleeding. Allergy: Negative for rash or hives.    Physical Examination:  BP 133/81 mmHg  Pulse 84  Temp(Src) 97.4 F (36.3 C)  Ht 5\' 2"  (1.575 m)  Wt 192 lb (87.091 kg)  BMI 35.11 kg/m2   General: Well-nourished, well-developed in no acute distress.  Head: Normocephalic, atraumatic.   Eyes: Conjunctiva pink, no icterus. Mouth: Oropharyngeal mucosa moist and pink , no lesions erythema or exudate. Neck: Supple without thyromegaly, masses, or lymphadenopathy.  Lungs: Clear to auscultation bilaterally.  Heart: Regular rate and rhythm, no murmurs rubs or gallops.  Abdomen: Bowel sounds are normal, mild diffuse tenderness, nondistended, no hepatosplenomegaly or masses, no abdominal bruits or    hernia , no rebound or guarding.   Rectal: deferred Extremities: No lower extremity edema. No clubbing or deformities.  Neuro: Alert and oriented x 4 , grossly normal neurologically.  Skin: Warm and dry, no rash or jaundice.   Psych: Alert and cooperative, normal mood and affect.  Labs: Lab Results  Component Value Date   CREATININE 1.20* 06/17/2015   BUN 13  06/17/2015   NA 137 06/17/2015   K 4.3 06/17/2015   CL 95* 06/17/2015   CO2 24 06/17/2015   Lab Results  Component Value Date   ALT 17 06/17/2015   AST 14 06/17/2015   ALKPHOS 80 06/17/2015   BILITOT 0.2 06/17/2015   Lab Results  Component Value Date   WBC 12.1* 06/17/2015   HCT 44.6 06/17/2015   MCV 86 06/17/2015   PLT 452* 06/17/2015   Lab Results  Component Value Date   VITAMINB12 1969* 06/17/2015   Lab Results  Component Value Date   TSH 2.540 06/03/2015     Imaging Studies: No results found.

## 2015-10-02 ENCOUNTER — Other Ambulatory Visit: Payer: Self-pay

## 2015-10-02 MED ORDER — AMITRIPTYLINE HCL 50 MG PO TABS: 50.0000 mg | ORAL_TABLET | Freq: Every day | ORAL | Status: DC

## 2015-10-02 NOTE — Assessment & Plan Note (Addendum)
65 year old lady with chronic diarrhea beginning approximately one year ago. Symptoms began after taking antibiotics for sinusitis. She delayed seeking treatment because of lack of insurance. Ultimately was treated with 2 courses of Flagyl empirically in the fall of last year. States it was very difficult to take the Flagyl due to side effects. When I saw her back in November she had several days of normal bowel movements and thought she was on the mend. However symptoms recurred. She complains of frequent loose stools, nocturnal diarrhea, intermittent vomiting, abdominal pain.  Differential diagnosis includes C. difficile colitis in the setting of immunosuppressive therapy, other infectious etiology, inflammatory bowel disease, less likely malignancy. I would like for her to have at least stool for C. difficile performed. We'll plan on colonoscopy in 2-3 weeks and treat for Cdiff in interim if positive.  I have discussed the risks, alternatives, benefits with regards to but not limited to the risk of reaction to medication, bleeding, infection, perforation and the patient is agreeable to proceed. Written consent to be obtained. Augment conscious sedation with phenergan 25mg  IV 30 minute before procedure due to polypharmacy.  Low residue diet. If symptoms worsen in the interim, she will let us know.

## 2015-10-03 NOTE — Progress Notes (Signed)
cc'ed to pcp °

## 2015-10-04 ENCOUNTER — Other Ambulatory Visit (HOSPITAL_COMMUNITY)
Admission: RE | Admit: 2015-10-04 | Discharge: 2015-10-04 | Disposition: A | Discharge status: Home / Self Care | Payer: Self-pay | Source: Ambulatory Visit | Attending: Gastroenterology | Admitting: Gastroenterology

## 2015-10-04 DIAGNOSIS — A09 Infectious gastroenteritis and colitis, unspecified: Secondary | ICD-10-CM | POA: Insufficient documentation

## 2015-10-04 LAB — C DIFFICILE QUICK SCREEN W PCR REFLEX
C DIFFICILE (CDIFF) INTERP: NEGATIVE
C DIFFICILE (CDIFF) TOXIN: NEGATIVE
C Diff antigen: NEGATIVE

## 2015-10-07 NOTE — Progress Notes (Signed)
Quick Note:  cdiff neg. tcs as planned. ______

## 2015-10-10 ENCOUNTER — Ambulatory Visit (INDEPENDENT_AMBULATORY_CARE_PROVIDER_SITE_OTHER): Payer: Self-pay | Admitting: Family Medicine

## 2015-10-10 ENCOUNTER — Encounter: Payer: Self-pay | Admitting: Family Medicine

## 2015-10-10 VITALS — BP 147/86 | HR 74 | Temp 97.5°F | Ht 62.0 in | Wt 195.0 lb

## 2015-10-10 DIAGNOSIS — E119 Type 2 diabetes mellitus without complications: Secondary | ICD-10-CM | POA: Insufficient documentation

## 2015-10-10 DIAGNOSIS — R300 Dysuria: Secondary | ICD-10-CM

## 2015-10-10 DIAGNOSIS — R7303 Prediabetes: Secondary | ICD-10-CM

## 2015-10-10 DIAGNOSIS — N3000 Acute cystitis without hematuria: Secondary | ICD-10-CM

## 2015-10-10 LAB — POCT UA - MICROSCOPIC ONLY
CRYSTALS, UR, HPF, POC: NEGATIVE
Casts, Ur, LPF, POC: NEGATIVE
Mucus, UA: NEGATIVE
YEAST UA: NEGATIVE

## 2015-10-10 LAB — POCT URINALYSIS DIPSTICK
BILIRUBIN UA: NEGATIVE
GLUCOSE UA: NEGATIVE
Ketones, UA: NEGATIVE
Nitrite, UA: NEGATIVE
UROBILINOGEN UA: NEGATIVE
pH, UA: 5

## 2015-10-10 MED ORDER — CITALOPRAM HYDROBROMIDE 40 MG PO TABS: 40.0000 mg | ORAL_TABLET | Freq: Every day | ORAL | Status: DC

## 2015-10-10 MED ORDER — SULFAMETHOXAZOLE-TRIMETHOPRIM 800-160 MG PO TABS: 1.0000 | ORAL_TABLET | Freq: Two times a day (BID) | ORAL | Status: DC

## 2015-10-10 NOTE — Addendum Note (Signed)
Addended by: Timmothy Euler on: 10/10/2015 09:34 AM   Modules accepted: Orders, SmartSet

## 2015-10-10 NOTE — Addendum Note (Signed)
Addended by: Wyline Mood on: 10/10/2015 09:39 AM   Modules accepted: Miquel Dunn

## 2015-10-10 NOTE — Progress Notes (Signed)
   HPI  Patient presents today here with concern for UTI.   She's had urinary urgency and urinary pressure described as you dysuria for 5 days. She had fever and chills subjectively about a half to 2 weeks ago which have resolved.  She's also had increased anxiety over the last 6 months due to her mother being critically ill. She denies suicidal ideation     PMH: Smoking status noted ROS: Per HPI  Objective: BP 147/86 mmHg  Pulse 74  Temp(Src) 97.5 F (36.4 C) (Oral)  Ht 5\' 2"  (1.575 m)  Wt 195 lb (88.451 kg)  BMI 35.66 kg/m2 Gen: NAD, alert, cooperative with exam HEENT: NCAT CV: RRR, good S1/S2, no murmur Resp: CTABL, no wheezes, non-labored Abd: SNTND, BS present, no guarding or organomegaly- no cva tenderness or suprapuic pain Ext: No edema, warm Neuro: Alert and oriented, No gross deficits  PHQ-9 - 19 GAD - 7 16  Assessment and plan:  # Urinary tract infection She does have clue cells, however no signs of bacterial vaginosis Treat with Bactrim No culture due to self-pay.   # Anxiety, depression Situational with her mother being critically ill. Increase Celexa to 40 mg per request    Orders Placed This Encounter  Procedures  . POCT urinalysis dipstick  . POCT UA - Microscopic Only    Meds ordered this encounter  Medications  . sulfamethoxazole-trimethoprim (BACTRIM DS) 800-160 MG tablet    Sig: Take 1 tablet by mouth 2 (two) times daily.    Dispense:  14 tablet    Refill:  0    Laroy Apple, MD [facility] 10/10/2015, 9:27 AM

## 2015-10-10 NOTE — Patient Instructions (Signed)
Great to see you!  Finish all antibiotics  Urinary Tract Infection Urinary tract infections (UTIs) can develop anywhere along your urinary tract. Your urinary tract is your body's drainage system for removing wastes and extra water. Your urinary tract includes two kidneys, two ureters, a bladder, and a urethra. Your kidneys are a pair of bean-shaped organs. Each kidney is about the size of your fist. They are located below your ribs, one on each side of your spine. CAUSES Infections are caused by microbes, which are microscopic organisms, including fungi, viruses, and bacteria. These organisms are so small that they can only be seen through a microscope. Bacteria are the microbes that most commonly cause UTIs. SYMPTOMS  Symptoms of UTIs may vary by age and gender of the patient and by the location of the infection. Symptoms in young women typically include a frequent and intense urge to urinate and a painful, burning feeling in the bladder or urethra during urination. Older women and men are more likely to be tired, shaky, and weak and have muscle aches and abdominal pain. A fever may mean the infection is in your kidneys. Other symptoms of a kidney infection include pain in your back or sides below the ribs, nausea, and vomiting. DIAGNOSIS To diagnose a UTI, your caregiver will ask you about your symptoms. Your caregiver will also ask you to provide a urine sample. The urine sample will be tested for bacteria and white blood cells. White blood cells are made by your body to help fight infection. TREATMENT  Typically, UTIs can be treated with medication. Because most UTIs are caused by a bacterial infection, they usually can be treated with the use of antibiotics. The choice of antibiotic and length of treatment depend on your symptoms and the type of bacteria causing your infection. HOME CARE INSTRUCTIONS  If you were prescribed antibiotics, take them exactly as your caregiver instructs you. Finish  the medication even if you feel better after you have only taken some of the medication.  Drink enough water and fluids to keep your urine clear or pale yellow.  Avoid caffeine, tea, and carbonated beverages. They tend to irritate your bladder.  Empty your bladder often. Avoid holding urine for long periods of time.  Empty your bladder before and after sexual intercourse.  After a bowel movement, women should cleanse from front to back. Use each tissue only once. SEEK MEDICAL CARE IF:   You have back pain.  You develop a fever.  Your symptoms do not begin to resolve within 3 days. SEEK IMMEDIATE MEDICAL CARE IF:   You have severe back pain or lower abdominal pain.  You develop chills.  You have nausea or vomiting.  You have continued burning or discomfort with urination. MAKE SURE YOU:   Understand these instructions.  Will watch your condition.  Will get help right away if you are not doing well or get worse.   This information is not intended to replace advice given to you by your health care provider. Make sure you discuss any questions you have with your health care provider.   Document Released: 05/13/2005 Document Revised: 04/24/2015 Document Reviewed: 09/11/2011 Elsevier Interactive Patient Education [employer].

## 2015-10-11 ENCOUNTER — Telehealth: Payer: Self-pay | Admitting: Family Medicine

## 2015-10-11 ENCOUNTER — Encounter: Payer: Self-pay | Admitting: Family Medicine

## 2015-10-11 DIAGNOSIS — Z794 Long term (current) use of insulin: Secondary | ICD-10-CM | POA: Insufficient documentation

## 2015-10-11 DIAGNOSIS — E119 Type 2 diabetes mellitus without complications: Secondary | ICD-10-CM | POA: Insufficient documentation

## 2015-10-11 LAB — CMP14+EGFR
ALK PHOS: 70 IU/L (ref 39–117)
ALT: 13 IU/L (ref 0–32)
AST: 12 IU/L (ref 0–40)
Albumin/Globulin Ratio: 1.2 (ref 1.1–2.5)
Albumin: 3.7 g/dL (ref 3.6–4.8)
BUN/Creatinine Ratio: 22 (ref 11–26)
BUN: 15 mg/dL (ref 8–27)
CHLORIDE: 100 mmol/L (ref 96–106)
CO2: 23 mmol/L (ref 18–29)
CREATININE: 0.69 mg/dL (ref 0.57–1.00)
Calcium: 9.2 mg/dL (ref 8.7–10.3)
GFR calc Af Amer: 107 mL/min/{1.73_m2} (ref >59)
GFR calc non Af Amer: 93 mL/min/{1.73_m2} (ref >59)
GLOBULIN, TOTAL: 3.1 g/dL (ref 1.5–4.5)
GLUCOSE: 125 mg/dL — AB (ref 65–99)
POTASSIUM: 3.8 mmol/L (ref 3.5–5.2)
SODIUM: 140 mmol/L (ref 134–144)
Total Protein: 6.8 g/dL (ref 6.0–8.5)

## 2015-10-11 LAB — POCT GLYCOSYLATED HEMOGLOBIN (HGB A1C): HEMOGLOBIN A1C: 6.7

## 2015-10-11 MED ORDER — SIMVASTATIN 40 MG PO TABS: 40.0000 mg | ORAL_TABLET | Freq: Every day | ORAL | Status: DC

## 2015-10-11 NOTE — Addendum Note (Signed)
Addended by: Pollyann Kennedy F on: 10/11/2015 10:28 AM   Modules accepted: Orders

## 2015-10-11 NOTE — Telephone Encounter (Signed)
Attempted call, no answer, will attempt later.  Laroy Apple, MD [facility] 10/11/2015, 12:20 PM

## 2015-10-11 NOTE — Telephone Encounter (Signed)
Called and discussed the results of her A1c.  Her A1c is 6.7, and the diabetic range.  For now I would consider this diet-controlled diabetes I will go ahead and increase her simvastatin to moderate intensity does, 40 mg.  She does not have insurance coverage so much of our routine testing will not be done for now including ophthalmology exam, microalbumin in the urine etc.  She will follow-up in 3 months  Discussed basics of diabetic diet, limiting carbohydrate intake, completely stopping sugar sweetened beverages  .Laroy Apple, MD [facility] 10/11/2015, 5:42 PM

## 2015-10-21 ENCOUNTER — Encounter (HOSPITAL_COMMUNITY): Payer: Self-pay

## 2015-10-21 ENCOUNTER — Encounter (HOSPITAL_COMMUNITY)
Admission: RE | Disposition: A | Discharge status: Home / Self Care | Payer: Self-pay | Source: Ambulatory Visit | Attending: Internal Medicine

## 2015-10-21 ENCOUNTER — Ambulatory Visit (HOSPITAL_COMMUNITY)
Admission: RE | Admit: 2015-10-21 | Discharge: 2015-10-21 | Disposition: A | Discharge status: Home / Self Care | Payer: Self-pay | Source: Ambulatory Visit | Attending: Internal Medicine | Admitting: Internal Medicine

## 2015-10-21 DIAGNOSIS — A09 Infectious gastroenteritis and colitis, unspecified: Secondary | ICD-10-CM

## 2015-10-21 DIAGNOSIS — Z79899 Other long term (current) drug therapy: Secondary | ICD-10-CM | POA: Insufficient documentation

## 2015-10-21 DIAGNOSIS — K621 Rectal polyp: Secondary | ICD-10-CM | POA: Insufficient documentation

## 2015-10-21 DIAGNOSIS — F329 Major depressive disorder, single episode, unspecified: Secondary | ICD-10-CM | POA: Insufficient documentation

## 2015-10-21 DIAGNOSIS — E785 Hyperlipidemia, unspecified: Secondary | ICD-10-CM | POA: Insufficient documentation

## 2015-10-21 DIAGNOSIS — I10 Essential (primary) hypertension: Secondary | ICD-10-CM | POA: Insufficient documentation

## 2015-10-21 DIAGNOSIS — K648 Other hemorrhoids: Secondary | ICD-10-CM | POA: Insufficient documentation

## 2015-10-21 DIAGNOSIS — K219 Gastro-esophageal reflux disease without esophagitis: Secondary | ICD-10-CM | POA: Insufficient documentation

## 2015-10-21 DIAGNOSIS — M069 Rheumatoid arthritis, unspecified: Secondary | ICD-10-CM | POA: Insufficient documentation

## 2015-10-21 DIAGNOSIS — K529 Noninfective gastroenteritis and colitis, unspecified: Secondary | ICD-10-CM | POA: Insufficient documentation

## 2015-10-21 DIAGNOSIS — Z7982 Long term (current) use of aspirin: Secondary | ICD-10-CM | POA: Insufficient documentation

## 2015-10-21 DIAGNOSIS — F17210 Nicotine dependence, cigarettes, uncomplicated: Secondary | ICD-10-CM | POA: Insufficient documentation

## 2015-10-21 DIAGNOSIS — R197 Diarrhea, unspecified: Secondary | ICD-10-CM

## 2015-10-21 DIAGNOSIS — Z791 Long term (current) use of non-steroidal anti-inflammatories (NSAID): Secondary | ICD-10-CM | POA: Insufficient documentation

## 2015-10-21 HISTORY — PX: COLONOSCOPY: SHX5424

## 2015-10-21 HISTORY — DX: Rectal polyp: K62.1

## 2015-10-21 LAB — C DIFFICILE QUICK SCREEN W PCR REFLEX
C DIFFICILE (CDIFF) INTERP: NEGATIVE
C DIFFICILE (CDIFF) TOXIN: NEGATIVE
C Diff antigen: NEGATIVE

## 2015-10-21 SURGERY — COLONOSCOPY
Anesthesia: Moderate Sedation

## 2015-10-21 MED ORDER — STERILE WATER FOR IRRIGATION IR SOLN
Status: DC | PRN
  Administered 2015-10-21: 14:00:00

## 2015-10-21 MED ORDER — MIDAZOLAM HCL 5 MG/5ML IJ SOLN
INTRAMUSCULAR | Status: AC
  Filled 2015-10-21: qty 10

## 2015-10-21 MED ORDER — ONDANSETRON HCL 4 MG/2ML IJ SOLN
INTRAMUSCULAR | Status: DC | PRN
  Administered 2015-10-21: 4 mg via INTRAVENOUS

## 2015-10-21 MED ORDER — SODIUM CHLORIDE 0.9% FLUSH
INTRAVENOUS | Status: AC
  Filled 2015-10-21: qty 10

## 2015-10-21 MED ORDER — PROMETHAZINE HCL 25 MG/ML IJ SOLN: 25.0000 mg | Freq: Once | INTRAMUSCULAR | Status: DC

## 2015-10-21 MED ORDER — ONDANSETRON HCL 4 MG/2ML IJ SOLN
INTRAMUSCULAR | Status: AC
  Filled 2015-10-21: qty 2

## 2015-10-21 MED ORDER — PROMETHAZINE HCL 25 MG/ML IJ SOLN
12.5000 mg | Freq: Once | INTRAMUSCULAR | Status: AC
  Administered 2015-10-21: 12.5 mg via INTRAVENOUS

## 2015-10-21 MED ORDER — MEPERIDINE HCL 100 MG/ML IJ SOLN
INTRAMUSCULAR | Status: DC | PRN
  Administered 2015-10-21: 25 mg via INTRAVENOUS
  Administered 2015-10-21: 50 mg via INTRAVENOUS

## 2015-10-21 MED ORDER — PROMETHAZINE HCL 25 MG/ML IJ SOLN
INTRAMUSCULAR | Status: AC
  Filled 2015-10-21: qty 1

## 2015-10-21 MED ORDER — SODIUM CHLORIDE 0.9 % IV SOLN
INTRAVENOUS | Status: DC
Start: 2015-10-21 — End: 2015-10-21
  Administered 2015-10-21: 14:00:00 via INTRAVENOUS

## 2015-10-21 MED ORDER — SODIUM CHLORIDE 0.9 % IV SOLN: INTRAVENOUS | Status: DC

## 2015-10-21 MED ORDER — MIDAZOLAM HCL 5 MG/5ML IJ SOLN
INTRAMUSCULAR | Status: DC | PRN
  Administered 2015-10-21: 2 mg via INTRAVENOUS
  Administered 2015-10-21 (×2): 1 mg via INTRAVENOUS

## 2015-10-21 MED ORDER — MEPERIDINE HCL 100 MG/ML IJ SOLN
INTRAMUSCULAR | Status: AC
  Filled 2015-10-21: qty 2

## 2015-10-21 NOTE — Op Note (Signed)
[facility] [address] [place], [address]   COLONOSCOPY PROCEDURE REPORT  PATIENT: Donna Rogers, Donna Rogers  MR#: [id number] BIRTHDATE: May 13, 1951 , 50  yrs. old GENDER: female ENDOSCOPIST: R.  Garfield Cornea, MD FACP [facility] REFERRED [id number] Wendi Snipes, MD PROCEDURE DATE:  November 12, 2015 PROCEDURE:   Ileo-colonoscopy with biopsy INDICATIONS:Chronic diarrhea. MEDICATIONS: Versed 4 mg IV and Demerol 75 mg IV in divided doses. Zofran 4 mg IV and Phenergan 12.5 mg IV. ASA CLASS:       Class II  CONSENT: The risks, benefits, alternatives and imponderables including but not limited to bleeding, perforation as well as the possibility of a missed lesion have been reviewed.  The potential for biopsy, lesion removal, etc. have also been discussed. Questions have been answered.  All parties agreeable.  Please see the history and physical in the medical record for more information.  DESCRIPTION OF PROCEDURE:   After the risks benefits and alternatives of the procedure were thoroughly explained, informed consent was obtained.  The digital rectal exam revealed no abnormalities of the rectum.   The [id number]  endoscope was introduced through the anus and advanced to the terminal ileum which was intubated for a short distance. No adverse events experienced.   The quality of the prep was adequate  The instrument was then slowly withdrawn as the colon was fully examined. Estimated blood loss is zero unless otherwise noted in this procedure report.      COLON FINDINGS: Internal hemorrhoids - minimally polypoid appearing mucosa - distal rectum, otherwise, rectal mucosa appeared entirely normal.  Normal-appearing colonic mucosa.  The distal 10 cm of terminal ileal mucosa also appeared normal.  segmental biopsies of the ascending as well as descending/sigmoid segments taken.biopsy of the distal rectal mucosa also taken.  Also, a stool sample was submitted for GI pathogens  and C. difficile toxin testing.  Retroflexion was performed. .  Withdrawal time=  .  The scope was withdrawn and the procedure completed. COMPLICATIONS: There were no immediate complications.  ENDOSCOPIC IMPRESSION: Internal hemorrhoids. Minimally abnormal appearing rectum?"status post segmental biopsy and stool sampling.  RECOMMENDATIONS: Follow-up on pending studies.  eSigned:  R. Garfield Cornea, MD [facility] 2015-11-12 2:38 PM   cc:  CPT CODES: ICD CODES:  The ICD and CPT codes recommended by this software are interpretations from the data that the clinical staff has captured with the software.  The verification of the translation of this report to the ICD and CPT codes and modifiers is the sole responsibility of the health care institution and practicing physician where this report was generated.  [employer] will not be held responsible for the validity of the ICD and CPT codes included on this report.  AMA assumes no liability for data contained or not contained herein. CPT is a Designer, television/film set of the [employer].  PATIENT NAME:  Donna Rogers, Donna Rogers MR#: [id number]

## 2015-10-21 NOTE — H&P (View-Only) (Signed)
Primary Care Physician:  Kenn File, MD  Primary Gastroenterologist:  Garfield Cornea, MD   Chief Complaint  Patient presents with  . set up TCS    HPI:  Donna Rogers is a 65 y.o. female here for follow up to reschedule colonoscopy. Last seen in 06/2015 for chronic diarrhea and brbpr, 35 pound weight loss. She was treated empirically with Flagyl on two occasions (no testing due to lack of insurance) prior to coming to see [place]. Cdiff not excluded. Her last TCS was in 2004.   At her last OV she thought she was back to normal. However shortly after that visit, her diarrhea returned. She did not call or seek help. She complains of nocturnal diarrhea. No melena, brbpr. All stools are loose. BM 6-7 times daily. Lower abdominal pain. Intermittent vomiting. ?chills. Enbrel on hold due to diarrhea and ?infection. Off methotrexate per rheumatologist due to potential diarrhea as a side effect. Weight has been essentially stable since her last OV. No heartburn. Feels worn out.   Current Outpatient Prescriptions  Medication Sig Dispense Refill  . amitriptyline (ELAVIL) 50 MG tablet Take 1 tablet (50 mg total) by mouth at bedtime. 30 tablet 3  . aspirin 81 MG tablet Take 81 mg by mouth daily.    Marland Kitchen CALCIUM-MAG-VIT C-VIT D PO Take by mouth.    . cholecalciferol (VITAMIN D) 1000 UNITS tablet Take 1,000 Units by mouth daily.    . citalopram (CELEXA) 20 MG tablet TAKE 1 TABLET (20 MG TOTAL) BY MOUTH DAILY.  3  . diclofenac (VOLTAREN) 75 MG EC tablet Take 75 mg by mouth daily.    . fish oil-omega-3 fatty acids 1000 MG capsule Take 2 g by mouth daily.    . folic acid (FOLVITE) 1 MG tablet Take 1 tablet (1 mg total) by mouth daily. 90 tablet 3  . gabapentin (NEURONTIN) 300 MG capsule Take 300 mg by mouth 3 (three) times daily.    Marland Kitchen HYDROcodone-acetaminophen (NORCO/VICODIN) 5-325 MG per tablet Take 1 tablet by mouth every 6 (six) hours as needed. 60 tablet 0  . omeprazole (PRILOSEC) 20 MG capsule TAKE 1  CAPSULE EVERY DAY 90 capsule 3  . simvastatin (ZOCOR) 10 MG tablet TAKE 1 TABLET (10 MG TOTAL) BY MOUTH AT BEDTIME. 90 tablet 1  . sulfaSALAzine (AZULFIDINE) 500 MG tablet Take 1,000 mg by mouth 2 (two) times daily.    . vitamin C (ASCORBIC ACID) 500 MG tablet Take 500 mg by mouth daily.    . Etanercept (ENBREL) 25 MG/0.5ML SOSY Inject into the skin. Reported on 10/01/2015    . hydrocortisone (PROCTOSOL HC) 2.5 % rectal cream Place 1 application rectally 2 (two) times daily. (Patient not taking: Reported on 10/01/2015) 30 g 0  . methotrexate (RHEUMATREX) 2.5 MG tablet Take 7.5 mg by mouth 3 (three) times a week. Reported on 10/01/2015     No current facility-administered medications for this visit.    Allergies as of 10/01/2015 - Review Complete 10/01/2015  Allergen Reaction Noted  . Ciprofloxacin hcl Rash 11/18/2012    Past Medical History  Diagnosis Date  . Depression   . GERD (gastroesophageal reflux disease)   . Hyperlipidemia   . Anemia   . Arthritis     rheumatoid  . Hypertension     Past Surgical History  Procedure Laterality Date  . Tonsillectomy    . Cyst removal trunk    . Esophagogastroduodenoscopy  08/2002    RMR: nonerosive reflux esophagitis, hiatal hernia  .  Colonoscopy  08/2002    RMR: internal hemorrhoids    Family History  Problem Relation Age of Onset  . Alzheimer's disease Mother   . Cancer Mother     lung  . Diabetes Father   . Colon cancer Neg Hx     Social History   Social History  . Marital Status: Legally Separated    Spouse Name: N/A  . Number of Children: 1  . Years of Education: N/A   Occupational History  . taking care of parents    Social History Main Topics  . Smoking status: Current Every Day Smoker -- 1.00 packs/day for 36 years    Types: Cigarettes  . Smokeless tobacco: Not on file  . Alcohol Use: No  . Drug Use: No  . Sexual Activity: Not on file   Other Topics Concern  . Not on file   Social History Narrative       ROS:  General: Negative for anorexia, fever, fatigue, weakness. See hpi. Eyes: Negative for vision changes.  ENT: Negative for hoarseness, difficulty swallowing , nasal congestion. CV: Negative for chest pain, angina, palpitations, dyspnea on exertion, peripheral edema.  Respiratory: Negative for dyspnea at rest, dyspnea on exertion, cough, sputum, wheezing.  GI: See history of present illness. GU:  Negative for dysuria, hematuria, urinary incontinence, urinary frequency, nocturnal urination.  MS: Negative for joint pain, low back pain.  Derm: Negative for rash or itching.  Neuro: Negative for weakness, abnormal sensation, seizure, frequent headaches, memory loss, confusion.  Psych: Negative for anxiety, depression, suicidal ideation, hallucinations.  Endo: Negative for unusual weight change.  Heme: Negative for bruising or bleeding. Allergy: Negative for rash or hives.    Physical Examination:  BP 133/81 mmHg  Pulse 84  Temp(Src) 97.4 F (36.3 C)  Ht 5\' 2"  (1.575 m)  Wt 192 lb (87.091 kg)  BMI 35.11 kg/m2   General: Well-nourished, well-developed in no acute distress.  Head: Normocephalic, atraumatic.   Eyes: Conjunctiva pink, no icterus. Mouth: Oropharyngeal mucosa moist and pink , no lesions erythema or exudate. Neck: Supple without thyromegaly, masses, or lymphadenopathy.  Lungs: Clear to auscultation bilaterally.  Heart: Regular rate and rhythm, no murmurs rubs or gallops.  Abdomen: Bowel sounds are normal, mild diffuse tenderness, nondistended, no hepatosplenomegaly or masses, no abdominal bruits or    hernia , no rebound or guarding.   Rectal: deferred Extremities: No lower extremity edema. No clubbing or deformities.  Neuro: Alert and oriented x 4 , grossly normal neurologically.  Skin: Warm and dry, no rash or jaundice.   Psych: Alert and cooperative, normal mood and affect.  Labs: Lab Results  Component Value Date   CREATININE 1.20* 06/17/2015   BUN 13  06/17/2015   NA 137 06/17/2015   K 4.3 06/17/2015   CL 95* 06/17/2015   CO2 24 06/17/2015   Lab Results  Component Value Date   ALT 17 06/17/2015   AST 14 06/17/2015   ALKPHOS 80 06/17/2015   BILITOT 0.2 06/17/2015   Lab Results  Component Value Date   WBC 12.1* 06/17/2015   HCT 44.6 06/17/2015   MCV 86 06/17/2015   PLT 452* 06/17/2015   Lab Results  Component Value Date   VITAMINB12 1969* 06/17/2015   Lab Results  Component Value Date   TSH 2.540 06/03/2015     Imaging Studies: No results found.

## 2015-10-21 NOTE — Interval H&P Note (Signed)
History and Physical Interval Note:  10/21/2015 2:06 PM  Donna Rogers  has presented today for surgery, with the diagnosis of diarrhea  The various methods of treatment have been discussed with the patient and family. After consideration of risks, benefits and other options for treatment, the patient has consented to  Procedure(s) with comments: COLONOSCOPY (N/A) - 250 - moved to 2:35 - office to notify pt as a surgical intervention .  The patient's history has been reviewed, patient examined, no change in status, stable for surgery.  I have reviewed the patient's chart and labs.  Questions were answered to the patient's satisfaction.     No change. Diagnostic colonoscopy per plan.  The risks, benefits, limitations, alternatives and imponderables have been reviewed with the patient. Questions have been answered. All parties are agreeable.   Manus Rudd

## 2015-10-21 NOTE — Discharge Instructions (Addendum)

## 2015-10-22 LAB — GASTROINTESTINAL PANEL BY PCR, STOOL (REPLACES STOOL CULTURE)
ASTROVIRUS: NOT DETECTED
Adenovirus F40/41: NOT DETECTED
Campylobacter species: NOT DETECTED
Cryptosporidium: NOT DETECTED
Cyclospora cayetanensis: NOT DETECTED
E. coli O157: NOT DETECTED
ENTAMOEBA HISTOLYTICA: NOT DETECTED
ENTEROAGGREGATIVE E COLI (EAEC): NOT DETECTED
ENTEROTOXIGENIC E COLI (ETEC): NOT DETECTED
Enteropathogenic E coli (EPEC): NOT DETECTED
GIARDIA LAMBLIA: NOT DETECTED
NOROVIRUS GI/GII: NOT DETECTED
Plesimonas shigelloides: NOT DETECTED
Rotavirus A: NOT DETECTED
SALMONELLA SPECIES: NOT DETECTED
Sapovirus (I, II, IV, and V): NOT DETECTED
Shiga like toxin producing E coli (STEC): NOT DETECTED
Shigella/Enteroinvasive E coli (EIEC): NOT DETECTED
VIBRIO CHOLERAE: NOT DETECTED
Vibrio species: NOT DETECTED
Yersinia enterocolitica: NOT DETECTED

## 2015-10-24 ENCOUNTER — Encounter (HOSPITAL_COMMUNITY): Payer: Self-pay | Admitting: Internal Medicine

## 2015-11-04 ENCOUNTER — Telehealth: Payer: Self-pay

## 2015-11-04 ENCOUNTER — Encounter: Payer: Self-pay | Admitting: Internal Medicine

## 2015-11-04 MED ORDER — BUDESONIDE 3 MG PO CPEP: 9.0000 mg | ORAL_CAPSULE | Freq: Every day | ORAL | Status: DC

## 2015-11-04 NOTE — Telephone Encounter (Signed)
rx has been sent in. Stacey please schedule ov

## 2015-11-04 NOTE — Telephone Encounter (Signed)
See results letter

## 2015-11-04 NOTE — Telephone Encounter (Signed)
Per RMR-  Letter from: Daneil Dolin    Send letter to patient.  Send copy of letter with path to referring provider and PCP.   Almyra Free patient needs 6 week course of Entocort 9 mg daily and then office follow-up.

## 2015-11-04 NOTE — Telephone Encounter (Signed)
Pt called and left voicemail- she never got path results from tcs. I do not see where a letter was done.  Routing to RMR for recommendations

## 2015-11-04 NOTE — Telephone Encounter (Signed)
Noted  

## 2015-11-04 NOTE — Telephone Encounter (Signed)
Letter mailed to the pt. Tried to call pt- NA

## 2015-11-05 ENCOUNTER — Telehealth: Payer: Self-pay | Admitting: Internal Medicine

## 2015-11-05 ENCOUNTER — Encounter: Payer: Self-pay | Admitting: Internal Medicine

## 2015-11-05 NOTE — Telephone Encounter (Signed)
APPT MADE AND LETTER SENT  °

## 2015-11-05 NOTE — Telephone Encounter (Signed)
Pt said the entocort prescription that was called in was too expensive for her and was there anything else we could recommend for her. Please advise

## 2015-11-05 NOTE — Telephone Encounter (Signed)
Spoke with pt she is aware of results and that the RX has been sent to her pharmacy

## 2015-11-06 MED ORDER — PREDNISONE 10 MG PO TABS: 20.0000 mg | ORAL_TABLET | Freq: Every day | ORAL | Status: DC

## 2015-11-06 NOTE — Addendum Note (Signed)
Addended by: Claudina Lick on: 11/06/2015 09:39 AM   Modules accepted: Orders

## 2015-11-06 NOTE — Telephone Encounter (Signed)
Pt is aware. rx has been sent in. She is aware of her ov in April.

## 2015-11-06 NOTE — Telephone Encounter (Signed)
OV made °

## 2015-11-06 NOTE — Telephone Encounter (Signed)
Mesalamine and Pepto-Bismol do not work very well for this condition. Let's go with with prednisone 20 mg daily for one month.  Office visit with extender in about 4-6 weeks to reassess and plan for taper.

## 2015-11-06 NOTE — Telephone Encounter (Signed)
We dont get samples and there is no patient assistance. Routing to RMR

## 2015-11-18 ENCOUNTER — Other Ambulatory Visit (HOSPITAL_COMMUNITY): Payer: Self-pay | Admitting: *Deleted

## 2015-11-18 DIAGNOSIS — N6452 Nipple discharge: Secondary | ICD-10-CM

## 2015-11-28 ENCOUNTER — Ambulatory Visit (HOSPITAL_COMMUNITY)
Admission: RE | Admit: 2015-11-28 | Discharge: 2015-11-28 | Disposition: A | Discharge status: Home / Self Care | Payer: Self-pay | Source: Ambulatory Visit | Attending: Obstetrics and Gynecology | Admitting: Obstetrics and Gynecology

## 2015-11-28 ENCOUNTER — Encounter (HOSPITAL_COMMUNITY): Payer: Self-pay

## 2015-11-28 ENCOUNTER — Ambulatory Visit
Admission: RE | Admit: 2015-11-28 | Discharge: 2015-11-28 | Disposition: A | Discharge status: Home / Self Care | Payer: No Typology Code available for payment source | Source: Ambulatory Visit | Attending: Obstetrics and Gynecology | Admitting: Obstetrics and Gynecology

## 2015-11-28 ENCOUNTER — Other Ambulatory Visit (HOSPITAL_COMMUNITY): Payer: Self-pay | Admitting: Obstetrics and Gynecology

## 2015-11-28 VITALS — BP 152/84 | Temp 98.2°F | Ht 63.0 in | Wt 199.4 lb

## 2015-11-28 DIAGNOSIS — N6452 Nipple discharge: Secondary | ICD-10-CM

## 2015-11-28 DIAGNOSIS — Z01419 Encounter for gynecological examination (general) (routine) without abnormal findings: Secondary | ICD-10-CM

## 2015-11-28 NOTE — Patient Instructions (Signed)
Educational materials on self breast awareness given. Explained to Donna Rogers that [facility] will cover Pap smears and HPV typing every 5 years unless has a history of abnormal Pap smears. Referred patient to the [facility] for diagnostic mammogram. Appointment scheduled for Thursday, November 28, 2015 at 1530. Let patient know will follow up with her within the next couple weeks with results of Pap smear by phone. Valmy verbalized understanding.  Etna Forquer, Arvil Chaco, RN 3:45 PM

## 2015-11-28 NOTE — Progress Notes (Signed)
Complaints of left breast spontaneous milky discharge x 1 month and left upper breast tenderness. Patient rated pain at a 7 out of 10.   Pap Smear: Pap smear completed today. Last Pap smear was 10 years ago at [facility] and normal per patient. Per patient has no history of an abnormal Pap smear. No Pap smear results in EPIC.  Physical exam: Breasts Breasts symmetrical. No skin abnormalities bilateral breasts. No nipple retraction bilateral breasts. No nipple discharge from right breast. A drop of clear discharge observed from left nipple when expressed. Unable to get enough discharge to send sample to cytology. Per patient amount of discharge has decreased. No lymphadenopathy. No lumps palpated bilateral breasts. Complaints of tenderness around 3 o'clock left breast on exam. Referred patient to the [facility] for diagnostic mammogram. Appointment scheduled for Thursday, November 28, 2015 at 1530.   Pelvic/Bimanual   Ext Genitalia No lesions, no swelling and no discharge observed on external genitalia.         Vagina Vagina pink and normal texture. No lesions or discharge observed in vagina.          Cervix Cervix is present. Cervix pink and of normal texture. No discharge observed.     Uterus Uterus is present and palpable. Uterus in normal position and normal size.        Adnexae Bilateral ovaries present and unable to palpate. No tenderness on palpation.          Rectovaginal No rectal exam completed today since patient had no rectal complaints. No skin abnormalities observed on exam.    Smoking History: Patient has never smoked.  Patient Navigation: Patient education provided. Access to services provided for patient through [place] [place] [facility] program.   Colorectal Cancer Screening: Patient had a colonoscopy completed 10/21/2015. No complaints today.

## 2015-11-29 LAB — CYTOLOGY - PAP

## 2015-12-02 ENCOUNTER — Telehealth (HOSPITAL_COMMUNITY): Payer: Self-pay | Admitting: *Deleted

## 2015-12-02 ENCOUNTER — Encounter (HOSPITAL_COMMUNITY): Payer: Self-pay | Admitting: *Deleted

## 2015-12-02 NOTE — Telephone Encounter (Signed)
Telephoned patient at home # and discussed negative pap smear results. HPV was also negative. Next pap smear due in 5 years. Patient voiced understanding. 

## 2015-12-03 ENCOUNTER — Ambulatory Visit (INDEPENDENT_AMBULATORY_CARE_PROVIDER_SITE_OTHER): Payer: Self-pay | Admitting: Gastroenterology

## 2015-12-03 ENCOUNTER — Encounter: Payer: Self-pay | Admitting: Gastroenterology

## 2015-12-03 VITALS — BP 153/89 | HR 73 | Temp 98.4°F | Ht 63.0 in | Wt 200.2 lb

## 2015-12-03 DIAGNOSIS — K52832 Lymphocytic colitis: Secondary | ICD-10-CM | POA: Insufficient documentation

## 2015-12-03 MED ORDER — PREDNISONE 10 MG PO TABS: ORAL_TABLET | ORAL | Status: DC

## 2015-12-03 NOTE — Progress Notes (Signed)
cc'ed to pcp °

## 2015-12-03 NOTE — Progress Notes (Signed)
Primary Care Physician: Kenn File, MD  Primary Gastroenterologist:  Garfield Cornea, MD   Chief Complaint  Patient presents with  . Follow-up    Diarrhea much better    HPI: Donna Rogers is a 65 y.o. female here for follow-up of chronic diarrhea. Underwent colonoscopy March 2017 noted to have minimally abnormal appearing rectum, normal distal TI, internal hemorrhoids. Stool samples were negative for GI pathogens and C. difficile. Pathology revealed lymphocytic colitis. Unfortunately she cannot afford Entocort, lack of insurance. Subsequently placed on prednisone 20 mg daily.  Bowel movements are more formed. No watery stools. 2-3 stools per day. Denies abdominal pain. Appetite is good. Takes diclofenac once daily. Rarely uses ibuprofen. She feels well, however is anxious to get off the prednisone due to feet swelling.  Recent abnormal mammogram, has to have a biopsy next week. Concern for papillary carcinoma.  Current Outpatient Prescriptions  Medication Sig Dispense Refill  . aspirin 81 MG tablet Take 81 mg by mouth daily.    Marland Kitchen CALCIUM-MAG-VIT C-VIT D PO Take 1 tablet by mouth daily.     . cholecalciferol (VITAMIN D) 1000 UNITS tablet Take 1,000 Units by mouth daily.    . citalopram (CELEXA) 40 MG tablet Take 1 tablet (40 mg total) by mouth daily. 30 tablet 11  . diclofenac (VOLTAREN) 75 MG EC tablet Take 75 mg by mouth daily.    . fish oil-omega-3 fatty acids 1000 MG capsule Take by mouth daily.     Marland Kitchen HYDROcodone-acetaminophen (NORCO/VICODIN) 5-325 MG per tablet Take 1 tablet by mouth every 6 (six) hours as needed. (Patient taking differently: Take 1 tablet by mouth every 6 (six) hours as needed for moderate pain. ) 60 tablet 0  . ibuprofen (ADVIL,MOTRIN) 200 MG tablet Take 800 mg by mouth every 6 (six) hours as needed for moderate pain.    Marland Kitchen omeprazole (PRILOSEC) 20 MG capsule TAKE 1 CAPSULE EVERY DAY 90 capsule 3  . predniSONE (DELTASONE) 10 MG tablet Take 2  tablets (20 mg total) by mouth daily with breakfast. For one month 60 tablet 0  . simvastatin (ZOCOR) 40 MG tablet Take 1 tablet (40 mg total) by mouth daily. 30 tablet 11  . sulfaSALAzine (AZULFIDINE) 500 MG tablet Take 1,000 mg by mouth 2 (two) times daily. Reported on 11/28/2015    . vitamin C (ASCORBIC ACID) 500 MG tablet Take 500 mg by mouth daily.     No current facility-administered medications for this visit.    Allergies as of 12/03/2015 - Review Complete 12/03/2015  Allergen Reaction Noted  . Ciprofloxacin hcl Rash 11/18/2012    ROS:  General: Negative for anorexia, weight loss, fever, chills, fatigue, weakness. ENT: Negative for hoarseness, difficulty swallowing , nasal congestion. CV: Negative for chest pain, angina, palpitations, dyspnea on exertion, peripheral edema.  Respiratory: Negative for dyspnea at rest, dyspnea on exertion, cough, sputum, wheezing.  GI: See history of present illness. GU:  Negative for dysuria, hematuria, urinary incontinence, urinary frequency, nocturnal urination.  Endo: Negative for unusual weight change.    Physical Examination:   BP 153/89 mmHg  Pulse 73  Temp(Src) 98.4 F (36.9 C) (Oral)  Ht 5\' 3"  (1.6 m)  Wt 200 lb 3.2 oz (90.81 kg)  BMI 35.47 kg/m2  General: Well-nourished, well-developed in no acute distress.  Eyes: No icterus. Mouth: Oropharyngeal mucosa moist and pink , no lesions erythema or exudate. Lungs: Clear to auscultation bilaterally.  Heart: Regular rate and rhythm, no murmurs rubs or  gallops.  Abdomen: Bowel sounds are normal, nontender, nondistended, no hepatosplenomegaly or masses, no abdominal bruits or hernia , no rebound or guarding.   Extremities: No lower extremity edema. No clubbing or deformities. Neuro: Alert and oriented x 4   Skin: Warm and dry, no jaundice.   Psych: Alert and cooperative, normal mood and affect.  Imaging Studies: US Breast Ltd Uni Left Inc Axilla  11/28/2015  CLINICAL DATA:   Spontaneous left nipple discharge for the past 2 months. The discharge is clear today and has been white previously. Diffuse anterior left breast tenderness. EXAM: 2D DIGITAL DIAGNOSTIC BILATERAL MAMMOGRAM WITH CAD AND ADJUNCT TOMO ULTRASOUND LEFT BREAST COMPARISON:  None. ACR Breast Density Category b: There are scattered areas of fibroglandular density. FINDINGS: There is a 2.1 cm circumscribed, lobulated mass in the retroareolar region of the left breast, anteriorly. There is also a 1.6 cm multilobulated, circumscribed area of nodularity more superiorly and more laterally in that portion of breast. No findings on the right suspicious for malignancy. Mammographic images were processed with CAD. On physical exam, a small amount of clear discharge was elicited from a single opening in the upper inner portion of the left nipple, in the 11 o'clock position. No mass was palpable in the retroareolar left breast or left axilla. Targeted ultrasound is performed, showing a 2.1 x 1.9 x 1.5 cm predominantly solid mass with some cystic components in the 12 o'clock retroareolar region of the left breast. This has some internal blood flow with color Doppler. Ultrasound of the more superior and lateral portions of the left breast demonstrated normal appearing fibroglandular tissue at the location of additional circumscribed nodularity seen mammographically. No additional cystic or solid masses were seen. Ultrasound of the left axilla demonstrated multiple normal appearing left axillary lymph nodes. IMPRESSION: 2.1 x 1.9 x 1.5 cm 12 o'clock retroareolar left breast mass with imaging features suspicious for a papillary neoplasm, possibly a papillary carcinoma. RECOMMENDATION: Ultrasound-guided core needle biopsy of the 2.1 cm 12 o'clock retroareolar left breast mass. This has been discussed with the patient and scheduled at 2 p.m. on 12/09/2015. She will discontinue taking elective 81 mg aspirin for 5 days prior to the biopsy. I  have discussed the findings and recommendations with the patient. Results were also provided in writing at the conclusion of the visit. If applicable, a reminder letter will be sent to the patient regarding the next appointment. BI-RADS CATEGORY  4: Suspicious. Electronically Signed   By: Claudie Revering M.D.   On: 11/28/2015 16:56   Mm Diag Breast Tomo Bilateral  11/28/2015  CLINICAL DATA:  Spontaneous left nipple discharge for the past 2 months. The discharge is clear today and has been white previously. Diffuse anterior left breast tenderness. EXAM: 2D DIGITAL DIAGNOSTIC BILATERAL MAMMOGRAM WITH CAD AND ADJUNCT TOMO ULTRASOUND LEFT BREAST COMPARISON:  None. ACR Breast Density Category b: There are scattered areas of fibroglandular density. FINDINGS: There is a 2.1 cm circumscribed, lobulated mass in the retroareolar region of the left breast, anteriorly. There is also a 1.6 cm multilobulated, circumscribed area of nodularity more superiorly and more laterally in that portion of breast. No findings on the right suspicious for malignancy. Mammographic images were processed with CAD. On physical exam, a small amount of clear discharge was elicited from a single opening in the upper inner portion of the left nipple, in the 11 o'clock position. No mass was palpable in the retroareolar left breast or left axilla. Targeted ultrasound is performed, showing a 2.1  x 1.9 x 1.5 cm predominantly solid mass with some cystic components in the 12 o'clock retroareolar region of the left breast. This has some internal blood flow with color Doppler. Ultrasound of the more superior and lateral portions of the left breast demonstrated normal appearing fibroglandular tissue at the location of additional circumscribed nodularity seen mammographically. No additional cystic or solid masses were seen. Ultrasound of the left axilla demonstrated multiple normal appearing left axillary lymph nodes. IMPRESSION: 2.1 x 1.9 x 1.5 cm 12 o'clock  retroareolar left breast mass with imaging features suspicious for a papillary neoplasm, possibly a papillary carcinoma. RECOMMENDATION: Ultrasound-guided core needle biopsy of the 2.1 cm 12 o'clock retroareolar left breast mass. This has been discussed with the patient and scheduled at 2 p.m. on 12/09/2015. She will discontinue taking elective 81 mg aspirin for 5 days prior to the biopsy. I have discussed the findings and recommendations with the patient. Results were also provided in writing at the conclusion of the visit. If applicable, a reminder letter will be sent to the patient regarding the next appointment. BI-RADS CATEGORY  4: Suspicious. Electronically Signed   By: Claudie Revering M.D.   On: 11/28/2015 16:56

## 2015-12-03 NOTE — Patient Instructions (Addendum)
1. Start your prednisone taper on Saturday. You will drop down to 15 mg for 5 days, 10 mg for 5 days, 5 mg for 5 days then stop. Please call with any recurrent symptoms. 2. If possible, you should consider stopping anti-inflammatories as these can be associated with microscopic colitis.

## 2015-12-03 NOTE — Assessment & Plan Note (Addendum)
Lymphocytic colitis diagnosed last month. Could not afford Entocort due to lack of insurance. She is doing well on prednisone 20 mg daily. Symptoms nearly resolved. Will start prednisone taper of the course the next 2 weeks. Warned patient of potential for recurrence of symptoms. She will let us know. Microscopic colitis has been associated with anti-inflammatories, would encourage her to limit as much as possible or discontinue if possible. It is unclear she will come off altogether given her rheumatoid arthritis.  With regards to restarting Enbrel, there is no contraindication based from GI standpoint however given possible new diagnosis of breast cancer, she should consider waiting till after her breast biopsy and discuss with her physician.  Return office visit here as needed. Next colonoscopy in 10 years.

## 2015-12-09 ENCOUNTER — Other Ambulatory Visit (HOSPITAL_COMMUNITY): Payer: Self-pay | Admitting: Obstetrics and Gynecology

## 2015-12-09 ENCOUNTER — Inpatient Hospital Stay: Admission: RE | Admit: 2015-12-09 | Payer: No Typology Code available for payment source | Source: Ambulatory Visit

## 2015-12-09 DIAGNOSIS — N6452 Nipple discharge: Secondary | ICD-10-CM

## 2015-12-17 ENCOUNTER — Other Ambulatory Visit: Payer: Self-pay

## 2015-12-20 ENCOUNTER — Ambulatory Visit
Admission: RE | Admit: 2015-12-20 | Discharge: 2015-12-20 | Disposition: A | Discharge status: Home / Self Care | Payer: No Typology Code available for payment source | Source: Ambulatory Visit | Attending: Obstetrics and Gynecology | Admitting: Obstetrics and Gynecology

## 2015-12-20 DIAGNOSIS — N6452 Nipple discharge: Secondary | ICD-10-CM

## 2016-01-03 ENCOUNTER — Other Ambulatory Visit: Payer: Self-pay | Admitting: Surgery

## 2016-01-03 DIAGNOSIS — D242 Benign neoplasm of left breast: Secondary | ICD-10-CM

## 2016-01-06 ENCOUNTER — Ambulatory Visit: Payer: Self-pay | Admitting: Gastroenterology

## 2016-01-08 ENCOUNTER — Encounter: Payer: Self-pay | Admitting: Family Medicine

## 2016-01-08 ENCOUNTER — Ambulatory Visit (INDEPENDENT_AMBULATORY_CARE_PROVIDER_SITE_OTHER): Payer: Self-pay | Admitting: Family Medicine

## 2016-01-08 VITALS — BP 152/91 | HR 86 | Temp 97.6°F | Ht 63.0 in | Wt 200.8 lb

## 2016-01-08 DIAGNOSIS — J302 Other seasonal allergic rhinitis: Secondary | ICD-10-CM

## 2016-01-08 DIAGNOSIS — E118 Type 2 diabetes mellitus with unspecified complications: Secondary | ICD-10-CM

## 2016-01-08 DIAGNOSIS — M25571 Pain in right ankle and joints of right foot: Secondary | ICD-10-CM

## 2016-01-08 DIAGNOSIS — M546 Pain in thoracic spine: Secondary | ICD-10-CM

## 2016-01-08 MED ORDER — METFORMIN HCL 500 MG PO TABS: 500.0000 mg | ORAL_TABLET | Freq: Two times a day (BID) | ORAL | Status: DC

## 2016-01-08 NOTE — Progress Notes (Signed)
   HPI  Patient presents today here for follow-up diabetes.  Patient explains that she has not really been watching her diet No medications, new diagnosis 3 months ago Feels well overall. Not checking blood sugars Not physically active.  Right ankle pain With swelling that's worse at the end of the day, pain is worse with swelling, mild pain without it. Medications not really helping. No compression yet.  Left shoulder pain Describes between left shoulder blade and spine sharp spasm type pain with certain movements Improves with massage.  Lots of cough and throat clearing lately  PMH: Smoking status noted ROS: Per HPI  Objective: BP 152/91 mmHg  Pulse 86  Temp(Src) 97.6 F (36.4 C) (Oral)  Ht _0  (1.6 m)  Wt 200 lb 12.8 oz (91.082 kg)  BMI 35.58 kg/m2 Gen: NAD, alert, cooperative with exam HEENT: NCAT, nares with swollen turbinates bilaterally CV: RRR, good S1/S2, no murmur Resp: CTABL, no wheezes, non-labored Ext: No edema, warm Neuro: Alert and oriented, No gross deficits MSK:  Right ankle with no tenderness to palpation, no gross deformity, full range of motion Left upper back with tenderness to palpation between the left scapula and thoracic spine in the rhomboid area  Assessment and plan:  # Type 2 diabetes Control slipping Add metformin CMP, 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 CMP14+EGFR    Meds ordered this encounter  Medications  . metFORMIN (GLUCOPHAGE) 500 MG tablet    Sig: Take 1 tablet (500 mg total) by mouth 2 (two) times daily with a meal.    Dispense:  60 tablet    Refill:  Burney, MD [facility] 01/08/2016, 2:24 PM

## 2016-01-08 NOTE — Patient Instructions (Addendum)
Great to see you!  Start  Metformin, cut back on carbohydrates  Try compression of your ankle and massage of your shoulder pain.   Lets follow up in 3 months, We will call within 1 week with lab results  Diet Recommendations for Diabetes   Starchy (carb) foods include: Bread, rice, pasta, potatoes, corn, crackers, bagels, muffins, all baked goods.   Protein foods include: Meat, fish, poultry, eggs, dairy foods, and beans such as pinto and kidney beans (beans also provide carbohydrate).   1. Eat at least 3 meals and 1-2 snacks per day. Never go more than 4-5 hours while awake without eating.  2. Limit starchy foods to TWO per meal and ONE per snack. ONE portion of a starchy  food is equal to the following:   - ONE slice of bread (or its equivalent, such as half of a hamburger bun).   - 1/2 cup of a "scoopable" starchy food such as potatoes or rice.   - 1 OUNCE (28 grams) of starchy snack foods such as crackers or pretzels (look on label).   - 15 grams of carbohydrate as shown on food label.  3. Both lunch and dinner should include a protein food, a carb food, and vegetables.   - Obtain twice as many veg's as protein or carbohydrate foods for both lunch and dinner.   - Try to keep frozen veg's on hand for a quick vegetable serving.     - Fresh or frozen veg's are best.  4. Breakfast should always include protein.

## 2016-01-09 LAB — CMP14+EGFR
ALT: 19 IU/L (ref 0–32)
AST: 14 IU/L (ref 0–40)
Albumin/Globulin Ratio: 1.5 (ref 1.2–2.2)
Albumin: 4.2 g/dL (ref 3.6–4.8)
Alkaline Phosphatase: 74 IU/L (ref 39–117)
BUN/Creatinine Ratio: 19 (ref 12–28)
BUN: 17 mg/dL (ref 8–27)
Bilirubin Total: <0.2 mg/dL (ref 0.0–1.2)
CALCIUM: 9.2 mg/dL (ref 8.7–10.3)
CO2: 24 mmol/L (ref 18–29)
CREATININE: 0.89 mg/dL (ref 0.57–1.00)
Chloride: 100 mmol/L (ref 96–106)
GFR calc Af Amer: 80 mL/min/{1.73_m2} (ref >59)
GFR, EST NON AFRICAN AMERICAN: 69 mL/min/{1.73_m2} (ref >59)
GLOBULIN, TOTAL: 2.8 g/dL (ref 1.5–4.5)
GLUCOSE: 169 mg/dL — AB (ref 65–99)
Potassium: 4 mmol/L (ref 3.5–5.2)
SODIUM: 143 mmol/L (ref 134–144)
Total Protein: 7 g/dL (ref 6.0–8.5)

## 2016-01-14 ENCOUNTER — Telehealth: Payer: Self-pay | Admitting: Family Medicine

## 2016-01-14 MED ORDER — GLIPIZIDE 5 MG PO TABS: 5.0000 mg | ORAL_TABLET | Freq: Two times a day (BID) | ORAL | Status: DC

## 2016-01-14 NOTE — Telephone Encounter (Signed)
Changed her prescription for glipizide.  A1c, was canceled, had rapid increase from the previous one.  Will ask nursing to discuss chance of hypoglycemia, recover with 4 ounces amount of sugar sweetened beverage.  Will recommend getting a blood glucose monitor, we have samples here.   Laroy Apple, MD [facility] 01/14/2016, 1:01 PM

## 2016-01-14 NOTE — Telephone Encounter (Signed)
Patient aware and verbalizes understanding. I left patient a BS machine up front to pick up.

## 2016-01-21 ENCOUNTER — Telehealth: Payer: Self-pay | Admitting: Family Medicine

## 2016-01-21 NOTE — Telephone Encounter (Signed)
Pt notified of Dr Alen Bleacher recommendation Verbalizes understanding Carb counting guide to front for pt pick up

## 2016-01-21 NOTE — Telephone Encounter (Signed)
Complex situation as she has had difficyult time with diarrhea in the past.   Stop med, montitor for a few days. Focus on hydration.     Laroy Apple, MD [facility] 01/21/2016, 9:36 AM

## 2016-01-23 ENCOUNTER — Telehealth: Payer: Self-pay | Admitting: Internal Medicine

## 2016-01-23 ENCOUNTER — Telehealth: Payer: Self-pay | Admitting: Family Medicine

## 2016-01-23 MED ORDER — PREDNISONE 10 MG PO TABS: ORAL_TABLET | ORAL | Status: DC

## 2016-01-23 NOTE — Telephone Encounter (Signed)
PATIENT CALLED AND STATED THAT SHE HAS BAD DIARRHEA AND NEEDS SOMETHING TO HELP

## 2016-01-23 NOTE — Telephone Encounter (Signed)
Spoke with the pt, she was doing well until she went back to her pcp and he tried her on a new DM medication, she has stopped taking it, but still having problems with diarrhea. She talked with her pcp today and he has sent in an Rx for prednisone. Pt is leaving early in the morning to go on vacation. She wants to know if its ok for her to take prednisone?

## 2016-01-23 NOTE — Telephone Encounter (Signed)
Patient with return of diarrheal episodes.  She is associating this temporally with her diabetic medications. I do believe metformin could cause the diarrhea, however I doubt glipizide to cause it.  She does have history of lymphocytic colitis, I have seen in the 12 day prednisone burst.  She had 10 episodes of loose stools yesterday and has continuous today.  She feels well otherwise.  She understands high blood sugars may occur because the prednisone.  Laroy Apple, MD [facility] 01/23/2016, 12:23 PM

## 2016-01-23 NOTE — Telephone Encounter (Signed)
I would proceed with prednisone as prescribed by PCP. If she does not improve, she should let me know.

## 2016-01-23 NOTE — Telephone Encounter (Signed)
Pt is aware.  

## 2016-01-23 NOTE — Telephone Encounter (Signed)
Tried to call again, [facility]

## 2016-01-23 NOTE — Telephone Encounter (Signed)
Tried to call pt- NA- LMOM with recommendations.  

## 2016-01-29 ENCOUNTER — Ambulatory Visit (INDEPENDENT_AMBULATORY_CARE_PROVIDER_SITE_OTHER): Payer: Self-pay | Admitting: Gastroenterology

## 2016-01-29 ENCOUNTER — Encounter: Payer: Self-pay | Admitting: Gastroenterology

## 2016-01-29 VITALS — BP 160/91 | HR 73 | Temp 97.6°F | Ht 63.0 in | Wt 203.2 lb

## 2016-01-29 DIAGNOSIS — K52832 Lymphocytic colitis: Secondary | ICD-10-CM

## 2016-01-29 MED ORDER — PREDNISONE 10 MG PO TABS: 20.0000 mg | ORAL_TABLET | Freq: Every day | ORAL | Status: DC

## 2016-01-29 NOTE — Progress Notes (Signed)
Primary Care Physician: Kenn File, MD  Primary Gastroenterologist:  Garfield Cornea, MD   Chief Complaint  Patient presents with  . Diarrhea    HPI: Donna Rogers is a 65 y.o. female here for follow-up of chronic diarrhea.Underwent colonoscopy March 2017 noted to have minimally abnormal appearing rectum, normal distal TI, internal hemorrhoids. Stool samples were negative for GI pathogens and C. difficile. Pathology revealed lymphocytic colitis. Unfortunately she cannot afford Entocort, lack of insurance. She was doing well back in April. In the interim she was diagnosed with diabetes and initially placed on metformin. She did not tolerate this due to diarrhea and after few days. Which to glipizide. Again states that she continued to have diarrhea therefore she came off of her diabetes medicines. She has been off medications for several weeks but diarrhea persisted. She saw her PCP who put her on a short steroid taper for her diarrhea. She has been on it for about 5-6 days with no significant improvement. BM up to 10 times daily. No melena, brbpr. No abdominal pain or vomiting. No recent antibiotic use.      Current Outpatient Prescriptions  Medication Sig Dispense Refill  . aspirin 81 MG tablet Take 81 mg by mouth daily.    Marland Kitchen CALCIUM-MAG-VIT C-VIT D PO Take 1 tablet by mouth daily.     . cholecalciferol (VITAMIN D) 1000 UNITS tablet Take 1,000 Units by mouth daily.    . citalopram (CELEXA) 40 MG tablet Take 1 tablet (40 mg total) by mouth daily. 30 tablet 11  . diclofenac (VOLTAREN) 75 MG EC tablet Take 75 mg by mouth daily.    . fish oil-omega-3 fatty acids 1000 MG capsule Take by mouth daily.     Marland Kitchen HYDROcodone-acetaminophen (NORCO/VICODIN) 5-325 MG per tablet Take 1 tablet by mouth every 6 (six) hours as needed. 60 tablet 0  . ibuprofen (ADVIL,MOTRIN) 200 MG tablet Take 800 mg by mouth every 6 (six) hours as needed for moderate pain.    Marland Kitchen omeprazole (PRILOSEC) 20 MG  capsule TAKE 1 CAPSULE EVERY DAY 90 capsule 3  . predniSONE (DELTASONE) 10 MG tablet Take 4 pills daily for 3 days, then 3 pills for 3 days, then 2 pills for 3 days, then 1 pill for 3 days 30 tablet 0  . simvastatin (ZOCOR) 40 MG tablet Take 1 tablet (40 mg total) by mouth daily. 30 tablet 11  . sulfaSALAzine (AZULFIDINE) 500 MG tablet Take 1,000 mg by mouth 2 (two) times daily. Reported on 11/28/2015    . vitamin C (ASCORBIC ACID) 500 MG tablet Take 500 mg by mouth daily.    Marland Kitchen glipiZIDE (GLUCOTROL) 5 MG tablet Take 1 tablet (5 mg total) by mouth 2 (two) times daily before a meal. (Patient not taking: Reported on 01/29/2016) 60 tablet 3   No current facility-administered medications for this visit.    Allergies as of 01/29/2016 - Review Complete 01/29/2016  Allergen Reaction Noted  . Ciprofloxacin hcl Rash 11/18/2012    ROS:  General: Negative for anorexia, weight loss, fever, chills, fatigue, weakness. ENT: Negative for hoarseness, difficulty swallowing , nasal congestion. CV: Negative for chest pain, angina, palpitations, dyspnea on exertion, peripheral edema.  Respiratory: Negative for dyspnea at rest, dyspnea on exertion, cough, sputum, wheezing.  GI: See history of present illness. GU:  Negative for dysuria, hematuria, urinary incontinence, urinary frequency, nocturnal urination.  Endo: Negative for unusual weight change.    Physical Examination:   BP 160/91 mmHg  Pulse 73  Temp(Src) 97.6 F (36.4 C) (Oral)  Ht 5\' 3"  (1.6 m)  Wt 203 lb 3.2 oz (92.171 kg)  BMI 36.00 kg/m2  General: Well-nourished, well-developed in no acute distress.  Eyes: No icterus. Mouth: Oropharyngeal mucosa moist and pink , no lesions erythema or exudate. Lungs: Clear to auscultation bilaterally.  Heart: Regular rate and rhythm, no murmurs rubs or gallops.  Abdomen: Bowel sounds are normal, nontender, nondistended, no hepatosplenomegaly or masses, no abdominal bruits or hernia , no rebound or guarding.    Extremities: No lower extremity edema. No clubbing or deformities. Neuro: Alert and oriented x 4   Skin: Warm and dry, no jaundice.   Psych: Alert and cooperative, normal mood and affect.  Labs:  Lab Results  Component Value Date   CREATININE 0.89 01/08/2016   BUN 17 01/08/2016   NA 143 01/08/2016   K 4.0 01/08/2016   CL 100 01/08/2016   CO2 24 01/08/2016   Lab Results  Component Value Date   ALT 19 01/08/2016   AST 14 01/08/2016   ALKPHOS 74 01/08/2016   BILITOT <0.2 01/08/2016     Imaging Studies: No results found.

## 2016-01-29 NOTE — Patient Instructions (Signed)
1. Continue your current prednisone prescription prescription, taking 3 tablets daily for 5 more days. Then start my prescription taking 2 tablets daily for 4 weeks. 2. We will see you back prior to stopping prednisone so that we can provide you with an appropriate taper. Do not stop prednisone without a taper. 3. If you tolerate, stop diclofenac as this has been associated with microscopic colitis. 4. Return to the office in 3 weeks, based on symptoms we will provide you with prednisone taper instructions. Please call sooner if her symptoms do not settle down.

## 2016-01-29 NOTE — Progress Notes (Signed)
cc'ed to pcp °

## 2016-01-29 NOTE — Assessment & Plan Note (Signed)
65 year old female with recent diagnosis of lymphocytic colitis, responded well to prednisone course earlier this year. Unfortunately she cannot afford Entocort, mesalamine and Pepto-Bismol options provide no significant benefit. Recent flare of diarrhea in the setting of metformin/glipizide. Discussed with the patient today, she's been off these medications for several weeks with noted improvement of symptoms therefore likely has a flare of her lymphocytic colitis. We are dealing with infectious etiology. Would expect improvement of her symptoms from prednisone in the very near future but will need prolonged course. She will continue 30 mg daily for 5 days and then drop to 20 mg daily for one month. If she does not note improvement of her symptoms she is to call us. Otherwise we'll see her back in 3 weeks and provide prednisone taper at that time.  I did suggest patient stop diclofenac as NSAIDs have been associated with microscopic colitis. As far as the Enbrel, there is no contraindication from a GI standpoint to restart this medication for her rheumatoid arthritis.

## 2016-01-30 ENCOUNTER — Telehealth (HOSPITAL_COMMUNITY): Payer: Self-pay | Admitting: *Deleted

## 2016-01-30 ENCOUNTER — Other Ambulatory Visit: Payer: Self-pay | Admitting: Surgery

## 2016-01-30 DIAGNOSIS — D242 Benign neoplasm of left breast: Secondary | ICD-10-CM

## 2016-01-30 NOTE — Telephone Encounter (Signed)
Returned patient's call. Patient was concerned because had not heard back from [facility] concerning date of surgery. Patient was seen on May 19 and surgery date had not been scheduled. Advised patient to call office. Patient voiced understanding.

## 2016-01-31 ENCOUNTER — Ambulatory Visit
Admission: RE | Admit: 2016-01-31 | Discharge: 2016-01-31 | Disposition: A | Discharge status: Home / Self Care | Payer: Self-pay | Source: Ambulatory Visit | Attending: Surgery | Admitting: Surgery

## 2016-01-31 DIAGNOSIS — D242 Benign neoplasm of left breast: Secondary | ICD-10-CM

## 2016-02-03 ENCOUNTER — Encounter (HOSPITAL_COMMUNITY): Payer: Self-pay

## 2016-02-03 ENCOUNTER — Encounter (HOSPITAL_COMMUNITY)
Admission: RE | Admit: 2016-02-03 | Discharge: 2016-02-03 | Disposition: A | Discharge status: Home / Self Care | Payer: Self-pay | Source: Ambulatory Visit | Attending: Surgery | Admitting: Surgery

## 2016-02-03 VITALS — BP 142/67 | HR 81 | Temp 99.2°F | Resp 20 | Ht 61.0 in | Wt 203.7 lb

## 2016-02-03 DIAGNOSIS — R9431 Abnormal electrocardiogram [ECG] [EKG]: Secondary | ICD-10-CM | POA: Insufficient documentation

## 2016-02-03 DIAGNOSIS — Z01812 Encounter for preprocedural laboratory examination: Secondary | ICD-10-CM | POA: Insufficient documentation

## 2016-02-03 DIAGNOSIS — E119 Type 2 diabetes mellitus without complications: Secondary | ICD-10-CM | POA: Insufficient documentation

## 2016-02-03 DIAGNOSIS — Z7982 Long term (current) use of aspirin: Secondary | ICD-10-CM | POA: Insufficient documentation

## 2016-02-03 DIAGNOSIS — F17200 Nicotine dependence, unspecified, uncomplicated: Secondary | ICD-10-CM | POA: Insufficient documentation

## 2016-02-03 DIAGNOSIS — Z79899 Other long term (current) drug therapy: Secondary | ICD-10-CM | POA: Insufficient documentation

## 2016-02-03 DIAGNOSIS — I10 Essential (primary) hypertension: Secondary | ICD-10-CM | POA: Insufficient documentation

## 2016-02-03 DIAGNOSIS — Z01818 Encounter for other preprocedural examination: Secondary | ICD-10-CM | POA: Insufficient documentation

## 2016-02-03 DIAGNOSIS — K219 Gastro-esophageal reflux disease without esophagitis: Secondary | ICD-10-CM | POA: Insufficient documentation

## 2016-02-03 DIAGNOSIS — D242 Benign neoplasm of left breast: Secondary | ICD-10-CM | POA: Insufficient documentation

## 2016-02-03 DIAGNOSIS — E785 Hyperlipidemia, unspecified: Secondary | ICD-10-CM | POA: Insufficient documentation

## 2016-02-03 DIAGNOSIS — Z7952 Long term (current) use of systemic steroids: Secondary | ICD-10-CM | POA: Insufficient documentation

## 2016-02-03 HISTORY — DX: Reserved for inherently not codable concepts without codable children: IMO0001

## 2016-02-03 HISTORY — DX: Headache: R51

## 2016-02-03 HISTORY — DX: Type 2 diabetes mellitus without complications: E11.9

## 2016-02-03 HISTORY — DX: Headache, unspecified: R51.9

## 2016-02-03 LAB — BASIC METABOLIC PANEL
ANION GAP: 8 (ref 5–15)
BUN: 15 mg/dL (ref 6–20)
CALCIUM: 9.2 mg/dL (ref 8.9–10.3)
CHLORIDE: 105 mmol/L (ref 101–111)
CO2: 28 mmol/L (ref 22–32)
CREATININE: 0.84 mg/dL (ref 0.44–1.00)
GFR calc non Af Amer: >60 mL/min (ref >60)
Glucose, Bld: 220 mg/dL — ABNORMAL HIGH (ref 65–99)
Potassium: 3.6 mmol/L (ref 3.5–5.1)
SODIUM: 141 mmol/L (ref 135–145)

## 2016-02-03 LAB — CBC
HCT: 42.9 % (ref 36.0–46.0)
HEMOGLOBIN: 13.8 g/dL (ref 12.0–15.0)
MCH: 28.3 pg (ref 26.0–34.0)
MCHC: 32.2 g/dL (ref 30.0–36.0)
MCV: 88.1 fL (ref 78.0–100.0)
PLATELETS: 292 10*3/uL (ref 150–400)
RBC: 4.87 MIL/uL (ref 3.87–5.11)
RDW: 14.3 % (ref 11.5–15.5)
WBC: 15.2 10*3/uL — AB (ref 4.0–10.5)

## 2016-02-03 LAB — GLUCOSE, CAPILLARY: Glucose-Capillary: 195 mg/dL — ABNORMAL HIGH (ref 65–99)

## 2016-02-03 NOTE — Pre-Procedure Instructions (Addendum)
Donna Rogers  02/03/2016      [facility] - [place], [place] - [address] [place] [address] Phone: [phone] Fax: [phone]  [id number] - [place], [place] [address] [place] [address] Phone: [phone] Fax: [phone]    Your procedure is scheduled on Thursday, June 22nd, 2017.  Report to [facility] Admitting at 10:40 A.M.   Call this number if you have problems the morning of surgery:  [phone]   Remember:  Do not eat food or drink liquids after midnight.   Take these medicines the morning of surgery with A SIP OF WATER: Citalopram (Celexa), Hydrocodone-acetaminophen (Norco) if needed, Omeprazole (Prilosec), Prednisone (Deltasone), Sulfrasalazine (Azulfidine).  5 days prior to surgery, stop taking: Aspirin, NSAIDS, Diclofenac (Voltaren), Aleve, Naproxen, Ibuprofen, Advil, Motrin, BC's, Goody's, Fish oil, all herbal medications, and all vitamins.    Do not wear jewelry, make-up or nail polish.  Do not wear lotions, powders, or perfumes.  You may NOT wear deoderant.  Do not shave 48 hours prior to surgery.    Do not bring valuables to the hospital.   [facility] is not responsible for any belongings or valuables.  Contacts, dentures or bridgework may not be worn into surgery.  Leave your suitcase in the car.  After surgery it may be brought to your room.  For patients admitted to the hospital, discharge time will be determined by your treatment team.  Patients discharged the day of surgery will not be allowed to drive home.   Special instructions:  See attached.   Please read over the following fact sheets that you were given.    How to Manage Your Diabetes Before and After Surgery  Why is it important to control my blood sugar before and after surgery? . Improving blood sugar levels before and after surgery helps healing and can limit problems. . A way of  improving blood sugar control is eating a healthy diet by: o  Eating less sugar and carbohydrates o  Increasing activity/exercise o  Talking with your doctor about reaching your blood sugar goals . High blood sugars (greater than 180 mg/dL) can raise your risk of infections and slow your recovery, so you will need to focus on controlling your diabetes during the weeks before surgery. . Make sure that the doctor who takes care of your diabetes knows about your planned surgery including the date and location.  How do I manage my blood sugar before surgery? . Check your blood sugar at least 4 times a day, starting 2 days before surgery, to make sure that the level is not too high or low. o Check your blood sugar the morning of your surgery when you wake up and every 2 hours until you get to the Short Stay unit. . If your blood sugar is less than 70 mg/dL, you will need to treat for low blood sugar: o Do not take insulin. o Treat a low blood sugar (less than 70 mg/dL) with  cup of clear juice (cranberry or apple), 4 glucose tablets, OR glucose gel. o Recheck blood sugar in 15 minutes after treatment (to make sure it is greater than 70 mg/dL). If your blood sugar is not greater than 70 mg/dL on recheck, call [phone] for further instructions. . Report your blood sugar to the short stay nurse when you get to Short Stay.  . If you are admitted to the hospital after  surgery: o Your blood sugar will be checked by the staff and you will probably be given insulin after surgery (instead of oral diabetes medicines) to make sure you have good blood sugar levels. o The goal for blood sugar control after surgery is 80-180 mg/dL.    [place]- Preparing For Surgery  Before surgery, you can play an important role. Because skin is not sterile, your skin needs to be as free of germs as possible. You can reduce the number of germs on your skin by washing with CHG (chlorahexidine gluconate) Soap before  surgery.  CHG is an antiseptic cleaner which kills germs and bonds with the skin to continue killing germs even after washing.  Please do not use if you have an allergy to CHG or antibacterial soaps. If your skin becomes reddened/irritated stop using the CHG.  Do not shave (including legs and underarms) for at least 48 hours prior to first CHG shower. It is OK to shave your face.  Please follow these instructions carefully.   1. Shower the NIGHT BEFORE SURGERY and the MORNING OF SURGERY with CHG.   2. If you chose to wash your hair, wash your hair first as usual with your normal shampoo.  3. After you shampoo, rinse your hair and body thoroughly to remove the shampoo.  4. Use CHG as you would any other liquid soap. You can apply CHG directly to the skin and wash gently with a scrungie or a clean washcloth.   5. Apply the CHG Soap to your body ONLY FROM THE NECK DOWN.  Do not use on open wounds or open sores. Avoid contact with your eyes, ears, mouth and genitals (private parts). Wash genitals (private parts) with your normal soap.  6. Wash thoroughly, paying special attention to the area where your surgery will be performed.  7. Thoroughly rinse your body with warm water from the neck down.  8. DO NOT shower/wash with your normal soap after using and rinsing off the CHG Soap.  9. Pat yourself dry with a CLEAN TOWEL.   10. Wear CLEAN PAJAMAS   11. Place CLEAN SHEETS on your bed the night of your first shower and DO NOT SLEEP WITH PETS.  Day of Surgery: Do not apply any deodorants/lotions. Please wear clean clothes to the hospital/surgery center.

## 2016-02-03 NOTE — Progress Notes (Signed)
PCP - Dr. Kenn File Cardiologist - denies  EKG - 02/03/16 CXR - denies Echo/stress test/cardiac cath - denies  Patient denies chest pain and shortness of breath at PAT appointment.  Patient complains of right foot pain with majority of pain on big toe.  Patient states that she has seen her PCP about pain and is now trying to get an appointment with a podiatrist.    Patient informs nurse that she has diabetes but does not take any medications for diabetes.  She checks her blood sugar once a day and her fasting glucose is 100's - 120's.

## 2016-02-04 ENCOUNTER — Ambulatory Visit (INDEPENDENT_AMBULATORY_CARE_PROVIDER_SITE_OTHER): Payer: Self-pay | Admitting: Family

## 2016-02-04 ENCOUNTER — Encounter: Payer: Self-pay | Admitting: Family

## 2016-02-04 ENCOUNTER — Encounter (HOSPITAL_COMMUNITY): Payer: Self-pay | Admitting: Emergency Medicine

## 2016-02-04 VITALS — BP 153/84 | HR 83 | Temp 97.0°F | Ht 61.0 in | Wt 204.0 lb

## 2016-02-04 DIAGNOSIS — I10 Essential (primary) hypertension: Secondary | ICD-10-CM

## 2016-02-04 DIAGNOSIS — M79674 Pain in right toe(s): Secondary | ICD-10-CM

## 2016-02-04 DIAGNOSIS — M109 Gout, unspecified: Secondary | ICD-10-CM

## 2016-02-04 DIAGNOSIS — M10071 Idiopathic gout, right ankle and foot: Secondary | ICD-10-CM

## 2016-02-04 DIAGNOSIS — E118 Type 2 diabetes mellitus with unspecified complications: Secondary | ICD-10-CM

## 2016-02-04 LAB — HEMOGLOBIN A1C
Hgb A1c MFr Bld: 8 % — ABNORMAL HIGH (ref 4.8–5.6)
MEAN PLASMA GLUCOSE: 183 mg/dL

## 2016-02-04 LAB — BAYER DCA HB A1C WAIVED: HB A1C (BAYER DCA - WAIVED): 7.7 % — ABNORMAL HIGH (ref <7.0)

## 2016-02-04 MED ORDER — COLCHICINE 0.6 MG PO TABS: ORAL_TABLET | ORAL | Status: DC

## 2016-02-04 NOTE — Addendum Note (Signed)
Addended by: Evelina Dun A on: 02/04/2016 05:26 PM   Modules accepted: Orders

## 2016-02-04 NOTE — Patient Instructions (Signed)

## 2016-02-04 NOTE — Progress Notes (Signed)
Anesthesia Chart Review:  Pt is a 65 year old female scheduled for L breast lumpectomy with radioactive seed localization on 02/06/2016 with Dr. Evlyn Courier.   PCP is Dr. Kenn File  PMH includes:  HTN, DM, hyperlipidemia, anemia, GERD. Current smoker. BMI 38.5  Medications include: ASA, prilosec, prednisone, simvastatin, sulfasalazine.   Preoperative labs reviewed.   - HgbA1c 8.0, glucose 220 - WBC 15.2  EKG 02/03/16: NSR. Nonspecific T wave abnormality.  I was unable to get in touch with pt to ask about symptoms of infection. She did complain at PAT yesterday that her toe hurt; it's possible she has an active infection based on WBC. I notified Abigail Butts in Dr. Trevor Mace office of WBC result. Will recheck CBC DOS. If labs acceptable and no signs/sx infection, I anticipate pt can proceed as scheduled.   Willeen Cass, FNP-BC [facility] Short Stay Surgical Center/Anesthesiology Phone: [phone] 02/04/2016 1:24 PM

## 2016-02-04 NOTE — Progress Notes (Addendum)
Subjective:    Patient ID: Donna Rogers, female    DOB: Mar 17, 1951, 65 y.o.   MRN: [id number]  Diabetes She presents for her follow-up diabetic visit. She has type 2 diabetes mellitus. Pertinent negatives for hypoglycemia include no headaches. Pertinent negatives for diabetes include no blurred vision, no foot paresthesias, no foot ulcerations and no visual change. Symptoms are stable. Pertinent negatives for diabetic complications include no CVA or heart disease. Current diabetic treatment includes diet. She is compliant with treatment some of the time. She is following a generally unhealthy diet. Her breakfast blood glucose range is generally 180-200 mg/dl. An ACE inhibitor/angiotensin II receptor blocker is not being taken. Eye exam is not current.  Hypertension This is a chronic problem. The current episode started more than 1 year ago. The problem is unchanged. The problem is uncontrolled. Associated symptoms include anxiety, malaise/fatigue and peripheral edema. Pertinent negatives include no blurred vision, headaches, palpitations or shortness of breath. Risk factors for coronary artery disease include diabetes mellitus and obesity. Past treatments include nothing. The current treatment provides no improvement. There is no history of kidney disease, CAD/MI, CVA or heart failure.   PT presents to the office today with right great toe pain and redness. Pt states her toe started hurting a week ago and it has become worse. PT states she is having a constant pain of 8 out 10. Pt states even the 'sheet at night hurts". Pt denies any history of gout. Pt states she had a CBC drawn yesterday and had an elevated WBC at 15.1.    Review of Systems  Constitutional: Positive for malaise/fatigue.  HENT: Negative.   Eyes: Negative.  Negative for blurred vision.  Respiratory: Negative.  Negative for shortness of breath.   Cardiovascular: Negative.  Negative for palpitations.  Gastrointestinal:  Negative.   Endocrine: Negative.   Genitourinary: Negative.   Musculoskeletal: Negative.   Neurological: Negative.  Negative for headaches.  Hematological: Negative.   Psychiatric/Behavioral: Negative.   All other systems reviewed and are negative.      Objective:   Physical Exam  Constitutional: She is oriented to person, place, and time. She appears well-developed and well-nourished. No distress.  HENT:  Head: Normocephalic.  Eyes: Pupils are equal, round, and reactive to light.  Neck: Normal range of motion. Neck supple. No thyromegaly present.  Cardiovascular: Normal rate, regular rhythm, normal heart sounds and intact distal pulses.   No murmur heard. Pulmonary/Chest: Effort normal and breath sounds normal. No respiratory distress. She has no wheezes.  Abdominal: Soft. Bowel sounds are normal. She exhibits no distension. There is no tenderness.  Musculoskeletal: Normal range of motion. She exhibits edema (right great toe erythemas and swelling). She exhibits no tenderness.  Neurological: She is alert and oriented to person, place, and time.  Skin: Skin is warm and dry.  Psychiatric: She has a normal mood and affect. Her behavior is normal. Judgment and thought content normal.  Vitals reviewed.   BP 153/84 mmHg  Pulse 83  Temp(Src) 97 F (36.1 C) (Oral)  Ht 5' 1"  (1.549 m)  Wt 204 lb (92.534 kg)  BMI 38.57 kg/m2       Assessment & Plan:  1. Great toe pain, right - Uric acid - colchicine 0.6 MG tablet; 1.2 mg PO x1 then 0.34m PO 1 h later. Max daily 1.8 mg  Dispense: 30 tablet; Refill: 1 - CMP14+EGFR  2. Acute gout of right foot, unspecified cause - Uric acid - colchicine 0.6 MG tablet;  1.2 mg PO x1 then 0.23m PO 1 h later. Max daily 1.8 mg  Dispense: 30 tablet; Refill: 1 - CMP14+EGFR  3. Essential hypertension  4. Type 2 diabetes mellitus with complication, unspecified long term insulin use status (HCC) - Microalbumin / creatinine urine ratio - Bayer DCA Hb  A1c Waived   Labs pending Pt started on colchicine today and if uric acid elevated will start allopurinol' Low purine diet discussed  RTO prn   CEvelina Dun FNP

## 2016-02-05 LAB — CMP14+EGFR
A/G RATIO: 1.7 (ref 1.2–2.2)
ALK PHOS: 68 IU/L (ref 39–117)
ALT: 16 IU/L (ref 0–32)
AST: 12 IU/L (ref 0–40)
Albumin: 4.3 g/dL (ref 3.6–4.8)
BILIRUBIN TOTAL: 0.2 mg/dL (ref 0.0–1.2)
BUN/Creatinine Ratio: 18 (ref 12–28)
BUN: 14 mg/dL (ref 8–27)
CALCIUM: 9.3 mg/dL (ref 8.7–10.3)
CHLORIDE: 99 mmol/L (ref 96–106)
CO2: 25 mmol/L (ref 18–29)
Creatinine, Ser: 0.76 mg/dL (ref 0.57–1.00)
GFR calc Af Amer: 97 mL/min/{1.73_m2} (ref >59)
GFR, EST NON AFRICAN AMERICAN: 84 mL/min/{1.73_m2} (ref >59)
GLOBULIN, TOTAL: 2.5 g/dL (ref 1.5–4.5)
Glucose: 175 mg/dL — ABNORMAL HIGH (ref 65–99)
POTASSIUM: 3.6 mmol/L (ref 3.5–5.2)
SODIUM: 141 mmol/L (ref 134–144)
Total Protein: 6.8 g/dL (ref 6.0–8.5)

## 2016-02-05 LAB — MICROALBUMIN / CREATININE URINE RATIO
CREATININE, UR: 118.3 mg/dL
MICROALB/CREAT RATIO: 24.4 mg/g{creat} (ref 0.0–30.0)
MICROALBUM., U, RANDOM: 28.9 ug/mL

## 2016-02-05 LAB — URIC ACID: URIC ACID: 4.2 mg/dL (ref 2.5–7.1)

## 2016-02-06 ENCOUNTER — Encounter (HOSPITAL_COMMUNITY): Admission: RE | Payer: Self-pay | Source: Ambulatory Visit

## 2016-02-06 ENCOUNTER — Other Ambulatory Visit: Payer: Self-pay | Admitting: Family

## 2016-02-06 ENCOUNTER — Ambulatory Visit (HOSPITAL_COMMUNITY): Admission: RE | Admit: 2016-02-06 | Payer: Self-pay | Source: Ambulatory Visit | Admitting: Surgery

## 2016-02-06 SURGERY — BREAST LUMPECTOMY WITH RADIOACTIVE SEED LOCALIZATION
Anesthesia: General | Laterality: Left

## 2016-02-06 MED ORDER — METFORMIN HCL 500 MG PO TABS: ORAL_TABLET | ORAL | Status: DC

## 2016-02-07 ENCOUNTER — Telehealth: Payer: Self-pay | Admitting: Family Medicine

## 2016-02-07 NOTE — Telephone Encounter (Signed)
Spoke to pt and she had not been taking the Glipizide so advised her she just needs to take the Metformin per [place]. Pt voiced understanding.

## 2016-02-19 ENCOUNTER — Encounter: Payer: Self-pay | Admitting: Family

## 2016-02-19 ENCOUNTER — Ambulatory Visit (INDEPENDENT_AMBULATORY_CARE_PROVIDER_SITE_OTHER): Payer: Self-pay | Admitting: Family

## 2016-02-19 VITALS — BP 130/67 | HR 87 | Temp 97.7°F | Ht 61.0 in | Wt 201.6 lb

## 2016-02-19 DIAGNOSIS — M722 Plantar fascial fibromatosis: Secondary | ICD-10-CM

## 2016-02-19 MED ORDER — NAPROXEN 500 MG PO TABS: 500.0000 mg | ORAL_TABLET | Freq: Two times a day (BID) | ORAL | Status: DC

## 2016-02-19 NOTE — Patient Instructions (Signed)
Plantar Fasciitis Plantar fasciitis is a painful foot condition that affects the heel. It occurs when the band of tissue that connects the toes to the heel bone (plantar fascia) becomes irritated. This can happen after exercising too much or doing other repetitive activities (overuse injury). The pain from plantar fasciitis can range from mild irritation to severe pain that makes it difficult for you to walk or move. The pain is usually worse in the morning or after you have been sitting or lying down for a while. CAUSES This condition may be caused by:  Standing for long periods of time.  Wearing shoes that do not fit.  Doing high-impact activities, including running, aerobics, and ballet.  Being overweight.  Having an abnormal way of walking (gait).  Having tight calf muscles.  Having high arches in your feet.  Starting a new athletic activity. SYMPTOMS The main symptom of this condition is heel pain. Other symptoms include:  Pain that gets worse after activity or exercise.  Pain that is worse in the morning or after resting.  Pain that goes away after you walk for a few minutes. DIAGNOSIS This condition may be diagnosed based on your signs and symptoms. Your health care provider will also do a physical exam to check for:  A tender area on the bottom of your foot.  A high arch in your foot.  Pain when you move your foot.  Difficulty moving your foot. You may also need to have imaging studies to confirm the diagnosis. These can include:  X-rays.  Ultrasound.  MRI. TREATMENT  Treatment for plantar fasciitis depends on the severity of the condition. Your treatment may include:  Rest, ice, and over-the-counter pain medicines to manage your pain.  Exercises to stretch your calves and your plantar fascia.  A splint that holds your foot in a stretched, upward position while you sleep (night splint).  Physical therapy to relieve symptoms and prevent problems in the  future.  Cortisone injections to relieve severe pain.  Extracorporeal shock wave therapy (ESWT) to stimulate damaged plantar fascia with electrical impulses. It is often used as a last resort before surgery.  Surgery, if other treatments have not worked after 12 months. HOME CARE INSTRUCTIONS  Take medicines only as directed by your health care provider.  Avoid activities that cause pain.  Roll the bottom of your foot over a bag of ice or a bottle of cold water. Do this for 20 minutes, 3-4 times a day.  Perform simple stretches as directed by your health care provider.  Try wearing athletic shoes with air-sole or gel-sole cushions or soft shoe inserts.  Wear a night splint while sleeping, if directed by your health care provider.  Keep all follow-up appointments with your health care provider. PREVENTION   Do not perform exercises or activities that cause heel pain.  Consider finding low-impact activities if you continue to have problems.  Lose weight if you need to. The best way to prevent plantar fasciitis is to avoid the activities that aggravate your plantar fascia. SEEK MEDICAL CARE IF:  Your symptoms do not go away after treatment with home care measures.  Your pain gets worse.  Your pain affects your ability to move or do your daily activities.   This information is not intended to replace advice given to you by your health care provider. Make sure you discuss any questions you have with your health care provider.   Document Released: 04/28/2001 Document Revised: 04/24/2015 Document Reviewed: 06/13/2014 Elsevier   Interactive Patient Education 2016 Elsevier Inc.  

## 2016-02-19 NOTE — Progress Notes (Signed)
   Subjective:    Patient ID: Donna Rogers, female    DOB: 1951/05/14, 65 y.o.   MRN: [id number]  HPI PT presents to the office today to recheck right great toe pain that was related to gout. PT was started on colchicine and is doing much better today. Pt states she is no longer having any pain in her great toe, but is having pain in the arch of her foot.  Pt states she is having intermittent pain of 8 out 10. Pt states walking and standing makes the pain worse.    Review of Systems  Constitutional: Negative.   HENT: Negative.   Eyes: Negative.   Respiratory: Negative.  Negative for shortness of breath.   Cardiovascular: Negative.  Negative for palpitations.  Gastrointestinal: Negative.   Endocrine: Negative.   Genitourinary: Negative.   Musculoskeletal: Positive for arthralgias and gait problem.  Neurological: Negative.  Negative for headaches.  Hematological: Negative.   Psychiatric/Behavioral: Negative.   All other systems reviewed and are negative.      Objective:   Physical Exam  Constitutional: She is oriented to person, place, and time. She appears well-developed and well-nourished. No distress.  HENT:  Head: Normocephalic and atraumatic.  Eyes: Pupils are equal, round, and reactive to light.  Neck: Normal range of motion. Neck supple. No thyromegaly present.  Cardiovascular: Normal rate, regular rhythm, normal heart sounds and intact distal pulses.   No murmur heard. Pulmonary/Chest: Effort normal and breath sounds normal. No respiratory distress. She has no wheezes.  Abdominal: Soft. Bowel sounds are normal. She exhibits no distension. There is no tenderness.  Musculoskeletal: Normal range of motion. She exhibits edema (trace in lateral right ankle). She exhibits no tenderness.  Neurological: She is alert and oriented to person, place, and time. She has normal reflexes. No cranial nerve deficit.  Skin: Skin is warm and dry.  Psychiatric: She has a normal mood and  affect. Her behavior is normal. Judgment and thought content normal.  Vitals reviewed.     BP 130/67 mmHg  Pulse 87  Temp(Src) 97.7 F (36.5 C) (Oral)  Ht 5\' 1"  (1.549 m)  Wt 201 lb 9.6 oz (91.445 kg)  BMI 38.11 kg/m2     Assessment & Plan:  1. Plantar fasciitis, right -Pt already on prednisone Continue prescription -Rest -Ice -ROM exercises discussed -Good shoe support RTO prn  - naproxen (NAPROSYN) 500 MG tablet; Take 1 tablet (500 mg total) by mouth 2 (two) times daily with a meal.  Dispense: 60 tablet; Refill: [place], FNP

## 2016-02-20 ENCOUNTER — Ambulatory Visit (INDEPENDENT_AMBULATORY_CARE_PROVIDER_SITE_OTHER): Payer: Self-pay | Admitting: Gastroenterology

## 2016-02-20 ENCOUNTER — Encounter: Payer: Self-pay | Admitting: Gastroenterology

## 2016-02-20 VITALS — BP 141/71 | HR 68 | Temp 98.1°F | Ht 61.0 in | Wt 202.2 lb

## 2016-02-20 DIAGNOSIS — K52832 Lymphocytic colitis: Secondary | ICD-10-CM

## 2016-02-20 NOTE — Patient Instructions (Signed)
1. Begin your prednisone taper tomorrow. Dropped to 10 mg once daily for 4 days. If you continue to do well with regards to bowel movements, then you can drop to 5 mg once daily for 4 days and then stop. For recurrent diarrhea, please call me. 2. Good luck with your surgery!

## 2016-02-20 NOTE — Progress Notes (Signed)
Primary Care Physician: Kenn File, MD  Primary Gastroenterologist:  Garfield Cornea, MD   Chief Complaint  Patient presents with  . Follow-up    doing better    HPI: Donna Rogers is a 65 y.o. female here for follow-up of chronic diarrhea. Last seen in June. Colonoscopy March 2017 consistent with lymphocytic colitis. Patient unable to afford Entocort due to lack of insurance.Since then she was diagnosed with diabetes and placed on metformin. Did not tolerate due to diarrhea. Switch to glipizide. Continue diarrhea so she came off diabetes medications for several weeks but diarrhea persisted. PCP put her on a short steroid taper for diarrhea and when I saw her last she be on it 5-6 days with no significant improvement. Was having bowel movements up to 10 times daily last month. She was advised to stop diclofenac as NSAIDs have been associated with microscopic colitis.could not tolerate coming off of nsaids due to her RA pain. Is back on metformin for diabetes.  BM now normal on prednisone 20mg  daily. Sugars have been more difficult to manage. She had elevated WBC in pre-op since being on prednisone and her breast surgery was cancelled. She goes for pre-op again next week and hopes to have surgery on 03/03/16. Ready to taper off of prednisone. Discussed with patient, her elevated WBC could have be related to steroid use.   Denies brbpr, abd pain. Appetite is good. She feels well.    Current Outpatient Prescriptions  Medication Sig Dispense Refill  . aspirin 81 MG tablet Take 81 mg by mouth daily.    Marland Kitchen CALCIUM-MAG-VIT C-VIT D PO Take 1 tablet by mouth daily.     . cholecalciferol (VITAMIN D) 1000 UNITS tablet Take 1,000 Units by mouth daily.    . citalopram (CELEXA) 40 MG tablet Take 1 tablet (40 mg total) by mouth daily. 30 tablet 11  . diclofenac (VOLTAREN) 75 MG EC tablet Take 75 mg by mouth daily.    . fish oil-omega-3 fatty acids 1000 MG capsule Take by mouth daily.     Marland Kitchen  HYDROcodone-acetaminophen (NORCO/VICODIN) 5-325 MG per tablet Take 1 tablet by mouth every 6 (six) hours as needed. 60 tablet 0  . ibuprofen (ADVIL,MOTRIN) 200 MG tablet Take 800 mg by mouth every 6 (six) hours as needed for moderate pain.    . metFORMIN (GLUCOPHAGE) 500 MG tablet Reported on 02/04/2016 (Patient taking differently: 2 (two) times daily with a meal. Reported on 02/04/2016) 180 tablet 1  . naproxen (NAPROSYN) 500 MG tablet Take 1 tablet (500 mg total) by mouth 2 (two) times daily with a meal. 60 tablet 1  . omeprazole (PRILOSEC) 20 MG capsule TAKE 1 CAPSULE EVERY DAY 90 capsule 3  . predniSONE (DELTASONE) 10 MG tablet Take 2 tablets (20 mg total) by mouth daily with breakfast. For four weeks. Taper to be provided at next follow up appointment. 60 tablet 0  . simvastatin (ZOCOR) 40 MG tablet Take 1 tablet (40 mg total) by mouth daily. 30 tablet 11  . sulfaSALAzine (AZULFIDINE) 500 MG tablet Take 1,000 mg by mouth 2 (two) times daily. Reported on 11/28/2015    . vitamin C (ASCORBIC ACID) 500 MG tablet Take 500 mg by mouth daily.     No current facility-administered medications for this visit.    Allergies as of 02/20/2016 - Review Complete 02/20/2016  Allergen Reaction Noted  . Glipizide Diarrhea 02/03/2016  . Metformin and related Diarrhea 02/03/2016  . Ciprofloxacin hcl Rash 11/18/2012  ROS:  General: Negative for anorexia, weight loss, fever, chills, fatigue, weakness. ENT: Negative for hoarseness, difficulty swallowing , nasal congestion. CV: Negative for chest pain, angina, palpitations, dyspnea on exertion, peripheral edema.  Respiratory: Negative for dyspnea at rest, dyspnea on exertion, cough, sputum, wheezing.  GI: See history of present illness. GU:  Negative for dysuria, hematuria, urinary incontinence, urinary frequency, nocturnal urination.  Endo: Negative for unusual weight change.    Physical Examination:   BP 141/71 mmHg  Pulse 68  Temp(Src) 98.1 F (36.7  C) (Oral)  Ht 5\' 1"  (1.549 m)  Wt 202 lb 3.2 oz (91.717 kg)  BMI 38.22 kg/m2  General: Well-nourished, well-developed in no acute distress.  Eyes: No icterus. Mouth: Oropharyngeal mucosa moist and pink , no lesions erythema or exudate. Lungs: Clear to auscultation bilaterally.  Heart: Regular rate and rhythm, no murmurs rubs or gallops.  Abdomen: Bowel sounds are normal, nontender, nondistended, no hepatosplenomegaly or masses, no abdominal bruits or hernia , no rebound or guarding.   Extremities: No lower extremity edema. No clubbing or deformities. Neuro: Alert and oriented x 4   Skin: Warm and dry, no jaundice.   Psych: Alert and cooperative, normal mood and affect.  Labs:  Lab Results  Component Value Date   CREATININE 0.76 02/04/2016   BUN 14 02/04/2016   NA 141 02/04/2016   K 3.6 02/04/2016   CL 99 02/04/2016   CO2 25 02/04/2016   Lab Results  Component Value Date   ALT 16 02/04/2016   AST 12 02/04/2016   ALKPHOS 68 02/04/2016   BILITOT 0.2 02/04/2016   Lab Results  Component Value Date   WBC 15.2* 02/03/2016   HGB 13.8 02/03/2016   HCT 42.9 02/03/2016   MCV 88.1 02/03/2016   PLT 292 02/03/2016    Imaging Studies: No results found.

## 2016-02-21 NOTE — Assessment & Plan Note (Signed)
Doing very well. Begin prednisone taper as outlined under patient instructions. Goal of having her off of steroids prior to her upcoming surgery. Patient's recent elevated WBC could have been steroid related, likely insignificant. To be rechecked at pre-op next week per patient.   She will call with any recurrent diarrhea. Unfortunately NSAIDS have been associated with microscopic colitis and patient has been unable to come off of due to RA pain. From GI standpoint, she can restart Enbrel.   Return to the office in four months for follow up or call sooner if needed.

## 2016-02-21 NOTE — Progress Notes (Signed)
Please schedule patient for follow up ov in four months.

## 2016-02-24 ENCOUNTER — Encounter: Payer: Self-pay | Admitting: Internal Medicine

## 2016-02-24 NOTE — Progress Notes (Signed)
APPT MADE AND LETTER SENT  °

## 2016-02-24 NOTE — Progress Notes (Signed)
cc'ed to pcp °

## 2016-02-27 ENCOUNTER — Encounter (HOSPITAL_COMMUNITY)
Admission: RE | Admit: 2016-02-27 | Discharge: 2016-02-27 | Disposition: A | Discharge status: Home / Self Care | Payer: Self-pay | Source: Ambulatory Visit | Attending: Surgery | Admitting: Surgery

## 2016-02-27 ENCOUNTER — Encounter (HOSPITAL_COMMUNITY): Payer: Self-pay

## 2016-02-27 VITALS — BP 159/74 | HR 76 | Temp 97.6°F | Resp 20 | Ht 61.0 in | Wt 202.2 lb

## 2016-02-27 DIAGNOSIS — D242 Benign neoplasm of left breast: Secondary | ICD-10-CM | POA: Insufficient documentation

## 2016-02-27 DIAGNOSIS — Z01812 Encounter for preprocedural laboratory examination: Secondary | ICD-10-CM | POA: Insufficient documentation

## 2016-02-27 LAB — BASIC METABOLIC PANEL
ANION GAP: 7 (ref 5–15)
BUN: 11 mg/dL (ref 6–20)
CHLORIDE: 103 mmol/L (ref 101–111)
CO2: 29 mmol/L (ref 22–32)
Calcium: 9.1 mg/dL (ref 8.9–10.3)
Creatinine, Ser: 0.81 mg/dL (ref 0.44–1.00)
GFR calc non Af Amer: >60 mL/min (ref >60)
GLUCOSE: 151 mg/dL — AB (ref 65–99)
Potassium: 3.8 mmol/L (ref 3.5–5.1)
Sodium: 139 mmol/L (ref 135–145)

## 2016-02-27 LAB — CBC
HCT: 42.3 % (ref 36.0–46.0)
Hemoglobin: 13.8 g/dL (ref 12.0–15.0)
MCH: 29.2 pg (ref 26.0–34.0)
MCHC: 32.6 g/dL (ref 30.0–36.0)
MCV: 89.4 fL (ref 78.0–100.0)
PLATELETS: 256 10*3/uL (ref 150–400)
RBC: 4.73 MIL/uL (ref 3.87–5.11)
RDW: 14.1 % (ref 11.5–15.5)
WBC: 9.2 10*3/uL (ref 4.0–10.5)

## 2016-02-27 NOTE — Pre-Procedure Instructions (Signed)
    Donna Rogers  02/27/2016      [facility], [place] - [address] [place] [address] Phone: [phone] Fax: [phone]  [id number] - [place], [place] [address] Phone: [phone] Fax: [phone]    Your procedure is scheduled on 03/03/16.  Report to [facility] Admitting at 630 A.M.  Call this number if you have problems the morning of surgery:  [phone]   Remember:  Do not eat food or drink liquids after midnight.  Take these medicines the morning of surgery with A SIP OF WATER --celexa,hydrocodone,prilosec   Do not wear jewelry, make-up or nail polish.  Do not wear lotions, powders, or perfumes.  You may wear deoderant.  Do not shave 48 hours prior to surgery.  Men may shave face and neck.  Do not bring valuables to the hospital.  [facility] is not responsible for any belongings or valuables.  Contacts, dentures or bridgework may not be worn into surgery.  Leave your suitcase in the car.  After surgery it may be brought to your room.  For patients admitted to the hospital, discharge time will be determined by your treatment team.  Patients discharged the day of surgery will not be allowed to drive home.   Name and phone number of your driver:    Special instructions:     Do not take any aspirin,anti-inflammatories,vitamins,or herbal supplements 5-7 days prior to surgery. Please read over the following fact sheets that you were given.

## 2016-02-28 LAB — GLUCOSE, CAPILLARY: Glucose-Capillary: 162 mg/dL — ABNORMAL HIGH (ref 65–99)

## 2016-03-02 ENCOUNTER — Ambulatory Visit
Admission: RE | Admit: 2016-03-02 | Discharge: 2016-03-02 | Disposition: A | Discharge status: Home / Self Care | Payer: No Typology Code available for payment source | Source: Ambulatory Visit | Attending: Surgery | Admitting: Surgery

## 2016-03-02 DIAGNOSIS — D242 Benign neoplasm of left breast: Secondary | ICD-10-CM

## 2016-03-02 MED ORDER — CEFAZOLIN SODIUM-DEXTROSE 2-4 GM/100ML-% IV SOLN
2.0000 g | INTRAVENOUS | Status: AC
  Administered 2016-03-03: 2 g via INTRAVENOUS
  Filled 2016-03-02: qty 100

## 2016-03-02 NOTE — H&P (Signed)
Donna Rogers  Location: [facility] Patient #: [id number] DOB: [birth year] Single / Language: Cleophus Molt / Race: White Female   History of Present Illness Patient words: P breast mass right breast.  The patient is a 65 year old female who presents with a breast mass. This patient is referred by Dr. Abelardo Diesel has a recent diagnosis of a papilloma of the left breast. She had been having clear spontaneous left breast nipple discharge for last 2 months. This prompted an ultrasound and mammogram demonstrating a lesion at the 12, position of left breast. Stereotactic biopsy was performed showing an intraductal papilloma. She is otherwise without complaints. She has no family history of breast cancer. She has no previous problems with her breasts.   Other Problems Arthritis Back Pain Gastroesophageal Reflux Disease  Past Surgical History Breast Biopsy Left. Tonsillectomy  Diagnostic Studies History  Colonoscopy within last year Mammogram within last year  Allergies Cipro *FLUOROQUINOLONES*  Medication History  Hydrocodone-Acetaminophen (5-325MG  Tablet, Oral) Active. Citalopram Hydrobromide (40MG  Tablet, Oral) Active. Simvastatin (40MG  Tablet, Oral) Active. Sulfamethoxazole-Trimethoprim (800-160MG  Tablet, Oral) Active. Aspirin (81MG  Tablet Chewable, Oral) Active. Calcium (500MG  Tablet, Oral) Active. Fish Oil (1000MG  Capsule, Oral) Active. Diclofenac (75MG  Tablet, Oral) Active. Vitamin B12 (1000MCG Tablet ER, Oral) Active. Medications Reconciled  Social History Caffeine use Carbonated beverages, Coffee, Tea. No alcohol use No drug use Tobacco use Current every day smoker.  Family History  Arthritis Brother, Father, Mother, Sister, Son. Cancer Mother. Cerebrovascular Accident Father. Colon Polyps Brother. Depression Mother, Son. Diabetes Mellitus Brother, Father, Sister. Heart Disease Father. Heart disease in female family  member before age 97 Thyroid problems Mother.  Pregnancy / Birth History Age at menarche 67 years. Age of menopause 31-50 Gravida 1 Maternal age 79-30 Para 1    Review of Systems General Not Present- Appetite Loss, Chills, Fatigue, Fever, Night Sweats, Weight Gain and Weight Loss. Skin Not Present- Change in Wart/Mole, Dryness, Hives, Jaundice, New Lesions, Non-Healing Wounds, Rash and Ulcer. HEENT Present- Seasonal Allergies. Not Present- Earache, Hearing Loss, Hoarseness, Nose Bleed, Oral Ulcers, Ringing in the Ears, Sinus Pain, Sore Throat, Visual Disturbances, Wears glasses/contact lenses and Yellow Eyes. Respiratory Present- Snoring. Not Present- Bloody sputum, Chronic Cough, Difficulty Breathing and Wheezing. Breast Present- Breast Mass and Nipple Discharge. Not Present- Breast Pain and Skin Changes. Cardiovascular Not Present- Chest Pain, Difficulty Breathing Lying Down, Leg Cramps, Palpitations, Rapid Heart Rate, Shortness of Breath and Swelling of Extremities. Gastrointestinal Not Present- Abdominal Pain, Bloating, Bloody Stool, Change in Bowel Habits, Chronic diarrhea, Constipation, Difficulty Swallowing, Excessive gas, Gets full quickly at meals, Hemorrhoids, Indigestion, Nausea, Rectal Pain and Vomiting. Female Genitourinary Not Present- Frequency, Nocturia, Painful Urination, Pelvic Pain and Urgency. Musculoskeletal Present- Back Pain, Joint Pain, Joint Stiffness and Swelling of Extremities. Not Present- Muscle Pain and Muscle Weakness. Neurological Not Present- Decreased Memory, Fainting, Headaches, Numbness, Seizures, Tingling, Tremor, Trouble walking and Weakness. Psychiatric Not Present- Anxiety, Bipolar, Change in Sleep Pattern, Depression, Fearful and Frequent crying. Endocrine Present- Heat Intolerance and Hot flashes. Not Present- Cold Intolerance, Excessive Hunger, Hair Changes and New Diabetes. Hematology Present- Easy Bruising. Not Present- Excessive bleeding,  Gland problems, HIV and Persistent Infections.  Vitals   Weight: 198 lb Height: 63in Body Surface Area: 1.93 m Body Mass Index: 35.07 kg/m  Temp.: 97.33F  Pulse: 90 (Regular)  BP: 138/80 (Sitting, Left Arm, Standard)       Physical Exam  General Mental Status-Alert. General Appearance-Consistent with stated age. Hydration-Well hydrated. Voice-Normal.  Head and Neck  Head-normocephalic, atraumatic with no lesions or palpable masses. Trachea-midline.  Eye Eyeball - Bilateral-Extraocular movements intact. Sclera/Conjunctiva - Bilateral-No scleral icterus.  Chest and Lung Exam Chest and lung exam reveals -quiet, even and easy respiratory effort with no use of accessory muscles and on auscultation, normal breath sounds, no adventitious sounds and normal vocal resonance. Inspection Chest Wall - Normal. Back - normal.  Cardiovascular Cardiovascular examination reveals -normal heart sounds, regular rate and rhythm with no murmurs and normal pedal pulses bilaterally.  Abdomen - Did not examine.  Neurologic Neurologic evaluation reveals -alert and oriented x 3 with no impairment of recent or remote memory. Mental Status-Normal.  Musculoskeletal Normal Exam - Left-Upper Extremity Strength Normal and Lower Extremity Strength Normal. Normal Exam - Right-Upper Extremity Strength Normal, Lower Extremity Weakness.    Assessment & Plan  INTRADUCTAL PAPILLOMA OF BREAST, LEFT (D24.2)  Impression: I have reviewed her mammograms and pathology report and have gone over this with the patient and her husband. Radioactive seed localized left breast lumpectomy is recommended. I discussed the surgical procedure with her in detail. I discussed the risks of surgery which includes but is not limited to bleeding, infection, need for further surgery if malignancy is found, injury to surrounding structures, cardiopulmonary issues, DVT, etc. I also discussed  postoperative recovery. She understands and wishes to proceed with surgery

## 2016-03-03 ENCOUNTER — Ambulatory Visit (HOSPITAL_COMMUNITY): Payer: No Typology Code available for payment source | Admitting: Certified Registered Nurse Anesthetist

## 2016-03-03 ENCOUNTER — Ambulatory Visit (HOSPITAL_COMMUNITY)
Admission: RE | Admit: 2016-03-03 | Discharge: 2016-03-03 | Disposition: A | Discharge status: Home / Self Care | Payer: No Typology Code available for payment source | Source: Ambulatory Visit | Attending: Surgery | Admitting: Surgery

## 2016-03-03 ENCOUNTER — Encounter (HOSPITAL_COMMUNITY)
Admission: RE | Disposition: A | Discharge status: Home / Self Care | Payer: Self-pay | Source: Ambulatory Visit | Attending: Surgery

## 2016-03-03 ENCOUNTER — Encounter (HOSPITAL_COMMUNITY): Payer: Self-pay | Admitting: Certified Registered Nurse Anesthetist

## 2016-03-03 ENCOUNTER — Ambulatory Visit
Admission: RE | Admit: 2016-03-03 | Discharge: 2016-03-03 | Disposition: A | Discharge status: Home / Self Care | Payer: No Typology Code available for payment source | Source: Ambulatory Visit | Attending: Surgery | Admitting: Surgery

## 2016-03-03 DIAGNOSIS — E119 Type 2 diabetes mellitus without complications: Secondary | ICD-10-CM | POA: Insufficient documentation

## 2016-03-03 DIAGNOSIS — D242 Benign neoplasm of left breast: Secondary | ICD-10-CM | POA: Insufficient documentation

## 2016-03-03 DIAGNOSIS — M1990 Unspecified osteoarthritis, unspecified site: Secondary | ICD-10-CM | POA: Insufficient documentation

## 2016-03-03 DIAGNOSIS — I10 Essential (primary) hypertension: Secondary | ICD-10-CM | POA: Insufficient documentation

## 2016-03-03 DIAGNOSIS — E669 Obesity, unspecified: Secondary | ICD-10-CM | POA: Insufficient documentation

## 2016-03-03 DIAGNOSIS — K219 Gastro-esophageal reflux disease without esophagitis: Secondary | ICD-10-CM | POA: Insufficient documentation

## 2016-03-03 DIAGNOSIS — F17200 Nicotine dependence, unspecified, uncomplicated: Secondary | ICD-10-CM | POA: Insufficient documentation

## 2016-03-03 HISTORY — PX: BREAST LUMPECTOMY WITH RADIOACTIVE SEED LOCALIZATION: SHX6424

## 2016-03-03 LAB — GLUCOSE, CAPILLARY
GLUCOSE-CAPILLARY: 115 mg/dL — AB (ref 65–99)
Glucose-Capillary: 112 mg/dL — ABNORMAL HIGH (ref 65–99)

## 2016-03-03 SURGERY — BREAST LUMPECTOMY WITH RADIOACTIVE SEED LOCALIZATION
Anesthesia: General | Site: Breast | Laterality: Left

## 2016-03-03 MED ORDER — DEXAMETHASONE SODIUM PHOSPHATE 10 MG/ML IJ SOLN
INTRAMUSCULAR | Status: DC | PRN
  Administered 2016-03-03: 5 mg via INTRAVENOUS

## 2016-03-03 MED ORDER — FENTANYL CITRATE (PF) 250 MCG/5ML IJ SOLN
INTRAMUSCULAR | Status: DC | PRN
  Administered 2016-03-03 (×2): 50 ug via INTRAVENOUS

## 2016-03-03 MED ORDER — OXYCODONE HCL 5 MG/5ML PO SOLN: 5.0000 mg | Freq: Once | ORAL | Status: AC | PRN

## 2016-03-03 MED ORDER — PHENYLEPHRINE HCL 10 MG/ML IJ SOLN
INTRAMUSCULAR | Status: DC | PRN
  Administered 2016-03-03: 80 ug via INTRAVENOUS
  Administered 2016-03-03 (×2): 40 ug via INTRAVENOUS

## 2016-03-03 MED ORDER — MIDAZOLAM HCL 2 MG/2ML IJ SOLN
INTRAMUSCULAR | Status: AC
  Filled 2016-03-03: qty 2

## 2016-03-03 MED ORDER — HYDROCODONE-ACETAMINOPHEN 5-325 MG PO TABS: 1.0000 | ORAL_TABLET | ORAL | Status: DC | PRN

## 2016-03-03 MED ORDER — OXYCODONE HCL 5 MG PO TABS
5.0000 mg | ORAL_TABLET | Freq: Once | ORAL | Status: AC | PRN
  Administered 2016-03-03: 5 mg via ORAL

## 2016-03-03 MED ORDER — EPHEDRINE SULFATE 50 MG/ML IJ SOLN
INTRAMUSCULAR | Status: DC | PRN
  Administered 2016-03-03: 10 mg via INTRAVENOUS

## 2016-03-03 MED ORDER — BUPIVACAINE-EPINEPHRINE (PF) 0.25% -1:200000 IJ SOLN
INTRAMUSCULAR | Status: AC
  Filled 2016-03-03: qty 30

## 2016-03-03 MED ORDER — OXYCODONE HCL 5 MG PO TABS: 5.0000 mg | ORAL_TABLET | ORAL | Status: DC | PRN

## 2016-03-03 MED ORDER — ACETAMINOPHEN 325 MG PO TABS: 650.0000 mg | ORAL_TABLET | ORAL | Status: DC | PRN

## 2016-03-03 MED ORDER — FENTANYL CITRATE (PF) 100 MCG/2ML IJ SOLN
25.0000 ug | INTRAMUSCULAR | Status: DC | PRN
  Administered 2016-03-03 (×2): 50 ug via INTRAVENOUS

## 2016-03-03 MED ORDER — PROPOFOL 10 MG/ML IV BOLUS
INTRAVENOUS | Status: DC | PRN
  Administered 2016-03-03: 25 mg via INTRAVENOUS
  Administered 2016-03-03: 150 mg via INTRAVENOUS

## 2016-03-03 MED ORDER — FENTANYL CITRATE (PF) 250 MCG/5ML IJ SOLN
INTRAMUSCULAR | Status: AC
  Filled 2016-03-03: qty 5

## 2016-03-03 MED ORDER — PROPOFOL 10 MG/ML IV BOLUS
INTRAVENOUS | Status: AC
  Filled 2016-03-03: qty 40

## 2016-03-03 MED ORDER — BUPIVACAINE-EPINEPHRINE 0.25% -1:200000 IJ SOLN
INTRAMUSCULAR | Status: DC | PRN
  Administered 2016-03-03: 20 mL

## 2016-03-03 MED ORDER — ONDANSETRON HCL 4 MG/2ML IJ SOLN
INTRAMUSCULAR | Status: DC | PRN
  Administered 2016-03-03: 4 mg via INTRAVENOUS

## 2016-03-03 MED ORDER — LACTATED RINGERS IV SOLN
INTRAVENOUS | Status: DC
Start: 2016-03-03 — End: 2016-03-03
  Administered 2016-03-03: 08:00:00 via INTRAVENOUS

## 2016-03-03 MED ORDER — OXYCODONE HCL 5 MG PO TABS
ORAL_TABLET | ORAL | Status: AC
  Filled 2016-03-03: qty 1

## 2016-03-03 MED ORDER — LIDOCAINE HCL (CARDIAC) 20 MG/ML IV SOLN
INTRAVENOUS | Status: DC | PRN
  Administered 2016-03-03: 60 mg via INTRAVENOUS

## 2016-03-03 MED ORDER — ACETAMINOPHEN 650 MG RE SUPP: 650.0000 mg | RECTAL | Status: DC | PRN

## 2016-03-03 MED ORDER — MORPHINE SULFATE (PF) 2 MG/ML IV SOLN: 1.0000 mg | INTRAVENOUS | Status: DC | PRN

## 2016-03-03 MED ORDER — ONDANSETRON HCL 4 MG/2ML IJ SOLN: 4.0000 mg | Freq: Four times a day (QID) | INTRAMUSCULAR | Status: DC | PRN

## 2016-03-03 MED ORDER — FENTANYL CITRATE (PF) 100 MCG/2ML IJ SOLN
INTRAMUSCULAR | Status: AC
  Filled 2016-03-03: qty 2

## 2016-03-03 SURGICAL SUPPLY — 42 items
APPLIER CLIP 9.375 MED OPEN (MISCELLANEOUS) ×2
BINDER BREAST LRG (GAUZE/BANDAGES/DRESSINGS) IMPLANT
BINDER BREAST XLRG (GAUZE/BANDAGES/DRESSINGS) ×2 IMPLANT
BLADE SURG 15 STRL LF DISP TIS (BLADE) ×1 IMPLANT
BLADE SURG 15 STRL SS (BLADE) ×1
CANISTER SUCTION 2500CC (MISCELLANEOUS) ×2 IMPLANT
CHLORAPREP W/TINT 26ML (MISCELLANEOUS) ×2 IMPLANT
CLIP APPLIE 9.375 MED OPEN (MISCELLANEOUS) ×1 IMPLANT
COVER PROBE W GEL 5X96 (DRAPES) ×2 IMPLANT
COVER SURGICAL LIGHT HANDLE (MISCELLANEOUS) ×2 IMPLANT
DEVICE DUBIN SPECIMEN MAMMOGRA (MISCELLANEOUS) ×2 IMPLANT
DRAPE CHEST BREAST 15X10 FENES (DRAPES) ×2 IMPLANT
DRAPE UTILITY XL STRL (DRAPES) ×2 IMPLANT
ELECT CAUTERY BLADE 6.4 (BLADE) ×2 IMPLANT
ELECT REM PT RETURN 9FT ADLT (ELECTROSURGICAL) ×2
ELECTRODE REM PT RTRN 9FT ADLT (ELECTROSURGICAL) ×1 IMPLANT
GLOVE BIO SURGEON STRL SZ 6.5 (GLOVE) ×2 IMPLANT
GLOVE BIOGEL PI IND STRL 6.5 (GLOVE) ×1 IMPLANT
GLOVE BIOGEL PI IND STRL 7.0 (GLOVE) ×1 IMPLANT
GLOVE BIOGEL PI INDICATOR 6.5 (GLOVE) ×1
GLOVE BIOGEL PI INDICATOR 7.0 (GLOVE) ×1
GLOVE SURG SIGNA 7.5 PF LTX (GLOVE) ×2 IMPLANT
GOWN STRL REUS W/ TWL LRG LVL3 (GOWN DISPOSABLE) ×1 IMPLANT
GOWN STRL REUS W/ TWL XL LVL3 (GOWN DISPOSABLE) ×1 IMPLANT
GOWN STRL REUS W/TWL LRG LVL3 (GOWN DISPOSABLE) ×1
GOWN STRL REUS W/TWL XL LVL3 (GOWN DISPOSABLE) ×1
KIT BASIN OR (CUSTOM PROCEDURE TRAY) ×2 IMPLANT
KIT MARKER MARGIN INK (KITS) ×2 IMPLANT
LIQUID BAND (GAUZE/BANDAGES/DRESSINGS) ×2 IMPLANT
NEEDLE HYPO 25X1 1.5 SAFETY (NEEDLE) ×2 IMPLANT
NS IRRIG 1000ML POUR BTL (IV SOLUTION) ×2 IMPLANT
PACK SURGICAL SETUP 50X90 (CUSTOM PROCEDURE TRAY) ×2 IMPLANT
PENCIL BUTTON HOLSTER BLD 10FT (ELECTRODE) ×2 IMPLANT
SPONGE LAP 18X18 X RAY DECT (DISPOSABLE) ×2 IMPLANT
SUT MNCRL AB 4-0 PS2 18 (SUTURE) ×2 IMPLANT
SUT VIC AB 3-0 SH 18 (SUTURE) ×2 IMPLANT
SYR BULB 3OZ (MISCELLANEOUS) ×2 IMPLANT
SYR CONTROL 10ML LL (SYRINGE) ×2 IMPLANT
TOWEL OR 17X24 6PK STRL BLUE (TOWEL DISPOSABLE) ×2 IMPLANT
TOWEL OR 17X26 10 PK STRL BLUE (TOWEL DISPOSABLE) ×2 IMPLANT
TUBE CONNECTING 12X1/4 (SUCTIONS) ×2 IMPLANT
YANKAUER SUCT BULB TIP NO VENT (SUCTIONS) ×2 IMPLANT

## 2016-03-03 NOTE — Op Note (Signed)
NAMEMEGYN, GRIMES NO.:  [id number]  MEDICAL RECORD NO.:  [id number]  LOCATION:  [facility]                         FACILITY:  [facility]  PHYSICIAN:  Coralie Keens, M.D. DATE OF BIRTH:  Jan 24, 1951  DATE OF PROCEDURE:  03/03/2016 DATE OF DISCHARGE:                              OPERATIVE REPORT   PREOPERATIVE DIAGNOSIS:  Left breast papilloma.  POSTOPERATIVE DIAGNOSIS:  Left breast papilloma.  PROCEDURE:  Radioactive seed localized left breast lumpectomy.  SURGEON:  Coralie Keens, M.D.  ANESTHESIA:  General and 0.25% Marcaine.  ESTIMATED BLOOD LOSS:  Minimal.  INDICATIONS:  This is a 65 year old female with an abnormality on the left breast on mammography.  Stereotactic biopsy performed showing this to be a papilloma.  The decision was made to proceed with radioactive seed guided lumpectomy.  FINDINGS:  The radioactive seed and previous marker were found to be in the lumpectomy specimen on the x-ray.  PROCEDURE IN DETAIL:  The patient was identified in the holding area as the correct patient.  The Neoprobe was used to confirm that the radioactive seed was indeed in the left breast.  She was then taken to the operating room.  She was placed in a supine position on the operating room table and general anesthesia was induced.  Her left breast was then prepped and draped in usual sterile fashion.  I identified the radioactive seed again at the 12 o'clock position of the breast.  I anesthetized the skin at the edge of the areola with Marcaine.  I then made a circumareolar incision with a scalpel.  I then took this down to the breast tissue with electrocautery.  I performed a wide lumpectomy using the Neoprobe getting all around the specimen.  I could actually feel the hard nodule at the edge of the specimen and then was able to visualize as well.  Once I completed the lumpectomy, I marked all margins with marker paint.  X-ray confirmed that the radioactive  seed and previous marker and mass were in the biopsy specimen.  I then achieved hemostasis in the lumpectomy cavity with the cautery.  I anesthetized the wound further with Marcaine.  I placed a single surgical clip at the base of the cavity.  I then closed the subcutaneous tissue with interrupted 3-0 Vicryl sutures and closed the skin with a running 4-0 Monocryl.  Skin glue was then applied.  The patient tolerated the procedure well.  All counts were correct at the end of the procedure.  The patient was then extubated in the operating room and taken in a stable condition to the recovery room.     Coralie Keens, M.D.     DB/MEDQ  D:  03/03/2016  T:  03/03/2016  Job:  [id number]

## 2016-03-03 NOTE — Discharge Instructions (Signed)
[facility] °Office Phone Number [phone] ° °BREAST BIOPSY/ PARTIAL MASTECTOMY: POST OP INSTRUCTIONS ° °Always review your discharge instruction sheet given to you by the facility where your surgery was performed. ° °IF YOU HAVE DISABILITY OR FAMILY LEAVE FORMS, YOU MUST BRING THEM TO THE OFFICE FOR PROCESSING.  DO NOT GIVE THEM TO YOUR DOCTOR. ° °1. A prescription for pain medication may be given to you upon discharge.  Take your pain medication as prescribed, if needed.  If narcotic pain medicine is not needed, then you may take acetaminophen (Tylenol) or ibuprofen (Advil) as needed. °2. Take your usually prescribed medications unless otherwise directed °3. If you need a refill on your pain medication, please contact your pharmacy.  They will contact our office to request authorization.  Prescriptions will not be filled after 5pm or on week-ends. °4. You should eat very light the first 24 hours after surgery, such as soup, crackers, pudding, etc.  Resume your normal diet the day after surgery. °5. Most patients will experience some swelling and bruising in the breast.  Ice packs and a good support bra will help.  Swelling and bruising can take several days to resolve.  °6. It is common to experience some constipation if taking pain medication after surgery.  Increasing fluid intake and taking a stool softener will usually help or prevent this problem from occurring.  A mild laxative (Milk of Magnesia or Miralax) should be taken according to package directions if there are no bowel movements after 48 hours. °7. Unless discharge instructions indicate otherwise, you may remove your bandages 24-48 hours after surgery, and you may shower at that time.  You may have steri-strips (small skin tapes) in place directly over the incision.  These strips should be left on the skin for 7-10 days.  If your surgeon used skin glue on the incision, you may shower in 24 hours.  The glue will flake off over the  next 2-3 weeks.  Any sutures or staples will be removed at the office during your follow-up visit. °8. ACTIVITIES:  You may resume regular daily activities (gradually increasing) beginning the next day.  Wearing a good support bra or sports bra minimizes pain and swelling.  You may have sexual intercourse when it is comfortable. °a. You may drive when you no longer are taking prescription pain medication, you can comfortably wear a seatbelt, and you can safely maneuver your car and apply brakes. °b. RETURN TO WORK:  ______________________________________________________________________________________ °9. You should see your doctor in the office for a follow-up appointment approximately two weeks after your surgery.  Your doctor’s nurse will typically make your follow-up appointment when she calls you with your pathology report.  Expect your pathology report 2-3 business days after your surgery.  You may call to check if you do not hear from us after three days. °10. OTHER INSTRUCTIONS: _______________________________________________________________________________________________ _____________________________________________________________________________________________________________________________________ °_____________________________________________________________________________________________________________________________________ °_____________________________________________________________________________________________________________________________________ ° °WHEN TO CALL YOUR DOCTOR: °1. Fever over 101.0 °2. Nausea and/or vomiting. °3. Extreme swelling or bruising. °4. Continued bleeding from incision. °5. Increased pain, redness, or drainage from the incision. ° °The clinic staff is available to answer your questions during regular business hours.  Please don’t hesitate to call and ask to speak to one of the nurses for clinical concerns.  If you have a medical emergency, go to the nearest  emergency room or call 911.  A surgeon from [facility] is always on call at the hospital. ° °For further questions, please visit [email or web address]  °

## 2016-03-03 NOTE — Anesthesia Postprocedure Evaluation (Signed)
Anesthesia Post Note  Patient: Donna Rogers  Procedure(s) Performed: Procedure(s) (LRB): BREAST LUMPECTOMY WITH RADIOACTIVE SEED LOCALIZATION (Left)  Patient location during evaluation: PACU Anesthesia Type: General Level of consciousness: awake and alert and patient cooperative Pain management: pain level controlled Vital Signs Assessment: post-procedure vital signs reviewed and stable Respiratory status: spontaneous breathing and respiratory function stable Cardiovascular status: stable Anesthetic complications: no    Last Vitals:  Filed Vitals:   03/03/16 0930 03/03/16 1012  BP:  120/64  Pulse: 77 73  Temp:    Resp: 14 16    Last Pain:  Filed Vitals:   03/03/16 1013  PainSc: 2                  Windsor Goeken S

## 2016-03-03 NOTE — Transfer of Care (Signed)
Immediate Anesthesia Transfer of Care Note  Patient: Donna Rogers  Procedure(s) Performed: Procedure(s): BREAST LUMPECTOMY WITH RADIOACTIVE SEED LOCALIZATION (Left)  Patient Location: PACU  Anesthesia Type:General  Level of Consciousness: awake, alert  and oriented  Airway & Oxygen Therapy: Patient Spontanous Breathing and Patient connected to nasal cannula oxygen  Post-op Assessment: Report given to RN and Post -op Vital signs reviewed and stable  Post vital signs: Reviewed and stable  Last Vitals:  Filed Vitals:   03/03/16 0640 03/03/16 0641  BP: 158/72   Pulse: 73   Temp:  36.6 C  Resp: 20     Last Pain: There were no vitals filed for this visit.    Patients Stated Pain Goal: 2 ([id number])  Complications: No apparent anesthesia complications

## 2016-03-03 NOTE — Anesthesia Preprocedure Evaluation (Addendum)
Anesthesia Evaluation  Patient identified by MRN, date of birth, ID band Patient awake    Reviewed: Allergy & Precautions, NPO status , Patient's Chart, lab work & pertinent test results  Airway Mallampati: II   Neck ROM: full    Dental  (+) Teeth Intact, Dental Advisory Given   Pulmonary Current Smoker,    breath sounds clear to auscultation       Cardiovascular hypertension,  Rhythm:regular Rate:Normal     Neuro/Psych  Headaches, Depression    GI/Hepatic GERD  ,  Endo/Other  diabetes, Type 2obese  Renal/GU      Musculoskeletal  (+) Arthritis ,   Abdominal   Peds  Hematology   Anesthesia Other Findings   Reproductive/Obstetrics                            Anesthesia Physical Anesthesia Plan  ASA: II  Anesthesia Plan: General   Post-op Pain Management:    Induction: Intravenous  Airway Management Planned: LMA  Additional Equipment:   Intra-op Plan:   Post-operative Plan:   Informed Consent: I have reviewed the patients History and Physical, chart, labs and discussed the procedure including the risks, benefits and alternatives for the proposed anesthesia with the patient or authorized representative who has indicated his/her understanding and acceptance.     Plan Discussed with: CRNA, Anesthesiologist and Surgeon  Anesthesia Plan Comments:         Anesthesia Quick Evaluation

## 2016-03-03 NOTE — Interval H&P Note (Signed)
History and Physical Interval Note: no change in H and P  03/03/2016 7:51 AM  Donna Rogers  has presented today for surgery, with the diagnosis of LEFT BREAST PAPILLOMA  The various methods of treatment have been discussed with the patient and family. After consideration of risks, benefits and other options for treatment, the patient has consented to  Procedure(s): BREAST LUMPECTOMY WITH RADIOACTIVE SEED LOCALIZATION (Left) as a surgical intervention .  The patient's history has been reviewed, patient examined, no change in status, stable for surgery.  I have reviewed the patient's chart and labs.  Questions were answered to the patient's satisfaction.     Shanna Strength A

## 2016-03-03 NOTE — Anesthesia Procedure Notes (Signed)
Procedure Name: LMA Insertion Date/Time: 03/03/2016 8:45 AM Performed by: Merdis Delay Pre-anesthesia Checklist: Patient identified, Emergency Drugs available, Suction available, Patient being monitored and Timeout performed Patient Re-evaluated:Patient Re-evaluated prior to inductionOxygen Delivery Method: Circle system utilized Preoxygenation: Pre-oxygenation with 100% oxygen Intubation Type: IV induction LMA: LMA inserted LMA Size: 4.0 Number of attempts: 1 Placement Confirmation: positive ETCO2,  CO2 detector and breath sounds checked- equal and bilateral Tube secured with: Tape Dental Injury: Teeth and Oropharynx as per pre-operative assessment

## 2016-03-03 NOTE — Op Note (Signed)
BREAST LUMPECTOMY WITH RADIOACTIVE SEED LOCALIZATION  Procedure Note  Donna Rogers 03/03/2016   Pre-op Diagnosis: LEFT BREAST PAPILLOMA     Post-op Diagnosis: same  Procedure(s): BREAST LUMPECTOMY WITH RADIOACTIVE SEED LOCALIZATION  Surgeon(s): Coralie Keens, MD  Anesthesia: General  Staff:  Circulator: Ted Mcalpine, RN Scrub Person: Jesse Sans, CST Circulator Assistant: Christen Bame, RN  Estimated Blood Loss: Minimal               Specimens: sent to path          [facility]   Date: 03/03/2016  Time: 9:09 AM

## 2016-03-04 ENCOUNTER — Encounter (HOSPITAL_COMMUNITY): Payer: Self-pay | Admitting: Surgery

## 2016-03-30 ENCOUNTER — Telehealth: Payer: Self-pay | Admitting: Internal Medicine

## 2016-03-30 DIAGNOSIS — R197 Diarrhea, unspecified: Secondary | ICD-10-CM

## 2016-03-30 MED ORDER — PREDNISONE 10 MG PO TABS: ORAL_TABLET | ORAL | 0 refills | Status: DC

## 2016-03-30 NOTE — Telephone Encounter (Signed)
I called pt and she has had diarrhea for several weeks now and is feeling very weak. Recently had surgery for breast cancer, doing well from surgery. She stopped Metformin on her own about a couple of weeks ago because that causes diarrhea, but still having it. She has been checking her blood sugars and they run around 120-130.  Said she is not going about 20 times in 24 hours because she goes through the night also. Please advise!

## 2016-03-30 NOTE — Addendum Note (Signed)
Addended by: Mahala Menghini on: 03/30/2016 02:08 PM   Modules accepted: Orders

## 2016-03-30 NOTE — Telephone Encounter (Signed)
Appt with Dr. Gala Romney on 04/28/2016 at 2:30 PM.

## 2016-03-30 NOTE — Telephone Encounter (Signed)
Since she has been at hospital with surgery, likely received abx, let's check a cdiff pcr.   Make sure she drinks enough liquid to keep urine light yellow.  Avoid dairy products while having diarrhea.  Bland diet for now.  Will restart her prednisone for likely lymphocytic colitis but would like first and off for specimen first.  She needs an office visit with Dr. Gala Romney within the next 3-4 weeks.

## 2016-03-30 NOTE — Telephone Encounter (Signed)
/  Pt is aware of plan and will come by the office to pick up stool container and orders. She will start the prednisone AFTER she turns in the stool sample.

## 2016-03-30 NOTE — Telephone Encounter (Signed)
PATIENT CALLED AND STATED THAT SHE HAS DIARRHEA AGAIN AND WAS TOLD TO CALL IF SHE HAD A PROBLEM ARISE. [phone]

## 2016-04-01 NOTE — Telephone Encounter (Signed)
noted 

## 2016-04-02 ENCOUNTER — Encounter: Payer: Self-pay | Admitting: Internal Medicine

## 2016-04-03 LAB — CLOSTRIDIUM DIFFICILE BY PCR: Toxigenic C. Difficile by PCR: NOT DETECTED

## 2016-04-10 ENCOUNTER — Ambulatory Visit (INDEPENDENT_AMBULATORY_CARE_PROVIDER_SITE_OTHER): Payer: Self-pay | Admitting: Family Medicine

## 2016-04-10 ENCOUNTER — Encounter: Payer: Self-pay | Admitting: Family Medicine

## 2016-04-10 VITALS — BP 150/75 | HR 70 | Temp 97.0°F | Ht 61.0 in | Wt 202.2 lb

## 2016-04-10 DIAGNOSIS — M25571 Pain in right ankle and joints of right foot: Secondary | ICD-10-CM

## 2016-04-10 DIAGNOSIS — E118 Type 2 diabetes mellitus with unspecified complications: Secondary | ICD-10-CM

## 2016-04-10 DIAGNOSIS — I10 Essential (primary) hypertension: Secondary | ICD-10-CM

## 2016-04-10 DIAGNOSIS — Z720 Tobacco use: Secondary | ICD-10-CM

## 2016-04-10 MED ORDER — GLYBURIDE 2.5 MG PO TABS: 2.5000 mg | ORAL_TABLET | Freq: Every day | ORAL | 5 refills | Status: DC

## 2016-04-10 MED ORDER — ATENOLOL 25 MG PO TABS: 25.0000 mg | ORAL_TABLET | Freq: Every day | ORAL | 5 refills | Status: DC

## 2016-04-10 NOTE — Patient Instructions (Signed)
Great to see you!  Lets see you again in December Start glyburide, one pill once a day  Start atenolol for your blood pressure, one pill once a day  Consider quitting smoking

## 2016-04-10 NOTE — Progress Notes (Signed)
   HPI  Patient presents today for diabetes,  Hypertension. Patient's very anxious about her blood pressure, she states that it's 140s most places that she goes. No chest pain, dyspnea, palpitations, leg edema.  Type 2 diabetes Average fasting blood sugar is 120-140 when she is not) is on, 140-170 when she is on prednisone She has recently restarted prednisone for his lymphocytic colitis.  Tobacco abuse Still smoking, not willing to quit.  Right ankle pain Persistent right lateral ankle and right posterior-Achilles tendon pain. Neck slight patient states it hurts worse with walking She has tried ice with minimal improvement. NSAIDs not helping, prednisone currently is not helping either. Has follow-up with podiatry scheduled. No injury    PMH: Smoking status noted ROS: Per HPI  Objective: BP (!) 150/75   Pulse 70   Temp 97 F (36.1 C) (Oral)   Ht 5\' 1"  (1.549 m)   Wt 202 lb 3.2 oz (91.7 kg)   BMI 38.21 kg/m  Gen: NAD, alert, cooperative with exam HEENT: NCAT, EOMI, PERRL CV: RRR, good S1/S2, no murmur Resp: CTABL, no wheezes, non-labored Abd: SNTND, BS present, no guarding or organomegaly Ext: No edema, warm Neuro: Alert and oriented, No gross deficits  Diabetic Foot Exam - Simple   Simple Foot Form Visual Inspection No deformities, no ulcerations, no other skin breakdown bilaterally:  Yes Sensation Testing Intact to touch and monofilament testing bilaterally:  Yes Pulse Check Posterior Tibialis and Dorsalis pulse intact bilaterally:  Yes Comments Bilateral feet with some scaling consistent with athlete's foot, yellowing and thickening of the nails bilaterally      Assessment and plan:  # Type 2 diabetes I think she likely has reasonable control, her fasting blood sugars are reasonable but elevated by prednisone She has stopped metformin due to diarrhea Trial of glyburide, continue to use 4$ list Planning A1c's every 6 months  #  Hypertension Mild Starting atenolol No red flags Limited labs due to self-pay status  # Tobacco abuse With related cough Recommended quitting, she's not ready  # Right ankle pain Previously felt to be later fasciitis, I'm concerned that she likely has more of a smoldering Achilles tendinopathy She has follow-up with podiatry Recommended continued compression, ice, and conservative treatment.     Meds ordered this encounter  Medications  . atenolol (TENORMIN) 25 MG tablet    Sig: Take 1 tablet (25 mg total) by mouth daily.    Dispense:  30 tablet    Refill:  5  . glyBURIDE (DIABETA) 2.5 MG tablet    Sig: Take 1 tablet (2.5 mg total) by mouth daily with breakfast.    Dispense:  30 tablet    Refill:  5    Laroy Apple, MD [facility] 04/10/2016, 12:29 PM

## 2016-04-10 NOTE — Telephone Encounter (Signed)
Please let patient know her C. difficile was negative. It is imperative that she keeps appointment with Dr. Buford Dresser next month.

## 2016-04-28 ENCOUNTER — Ambulatory Visit: Payer: No Typology Code available for payment source | Admitting: Internal Medicine

## 2016-04-29 ENCOUNTER — Encounter: Payer: Self-pay | Admitting: Family Medicine

## 2016-04-29 ENCOUNTER — Ambulatory Visit (INDEPENDENT_AMBULATORY_CARE_PROVIDER_SITE_OTHER): Payer: Self-pay | Admitting: Family Medicine

## 2016-04-29 VITALS — BP 130/82 | HR 75 | Temp 97.7°F | Ht 61.0 in | Wt 199.6 lb

## 2016-04-29 DIAGNOSIS — R310 Gross hematuria: Secondary | ICD-10-CM

## 2016-04-29 LAB — URINALYSIS
GLUCOSE, UA: NEGATIVE
Ketones, UA: NEGATIVE
Leukocytes, UA: NEGATIVE
Nitrite, UA: NEGATIVE
PH UA: 5.5 (ref 5.0–7.5)
Specific Gravity, UA: >1.03 — ABNORMAL HIGH (ref 1.005–1.030)
UUROB: 0.2 mg/dL (ref 0.2–1.0)

## 2016-04-29 NOTE — Patient Instructions (Signed)
Great to see you!  We will help arrange a urology visit and give you a call.   We will call with labs within 1 week

## 2016-04-29 NOTE — Progress Notes (Signed)
   HPI  Patient presents today hear with gross hematuria  Pt c/o sevreal episodes of gross hematuria lower abdominal discomfort, and low back pain for 3 days.   No fevers, chills., sweats.   No discrete dysuria. Mild urinary frequency. She is a long time smoker.   PMH: Smoking status noted ROS: Per HPI  Objective: BP 130/82   Pulse 75   Temp 97.7 F (36.5 C) (Oral)   Ht '5\' 1"'$  (1.549 m)   Wt 199 lb 9.6 oz (90.5 kg)   BMI 37.71 kg/m  Gen: NAD, alert, cooperative with exam HEENT: NCAT CV: RRR, good S1/S2, no murmur Resp: CTABL, no wheezes, non-labored Abd: mild BL Upper quadrant tenderness to apla, no suprapubic tenderness or CVA tenderness Ext: No edema, warm Neuro: Alert and oriented, No gross deficits  UA 3+ blood, 1+ protein, No leukocytes or nitrites.  Assessment and plan:  # Gross hematuria No signs of UTI, discussed with her that given her age and history of smoking gross hematuria should be evaluated by a urologist to rule out bladder cancer. She is self-pay today, I had checked a BMP to evaluate kidney function. Dip was only performed, no culture or micro-was performed given that this was gross hematuria.  # Hypertension Doing well with atenolol, continue Well-controlled   Orders Placed This Encounter  Procedures  . Urinalysis  . BMP8+EGFR  . Ambulatory referral to Urology    Referral Priority:   Routine    Referral Type:   Consultation    Referral Reason:   Specialty Services Required    Requested Specialty:   Urology    Number of Visits Requested:   [place], MD [place] 04/29/2016, 3:13 PM

## 2016-04-30 LAB — BMP8+EGFR
BUN / CREAT RATIO: 19 (ref 12–28)
BUN: 19 mg/dL (ref 8–27)
CHLORIDE: 98 mmol/L (ref 96–106)
CO2: 26 mmol/L (ref 18–29)
Calcium: 10 mg/dL (ref 8.7–10.3)
Creatinine, Ser: 0.98 mg/dL (ref 0.57–1.00)
GFR calc non Af Amer: 62 mL/min/{1.73_m2} (ref >59)
GFR, EST AFRICAN AMERICAN: 71 mL/min/{1.73_m2} (ref >59)
GLUCOSE: 91 mg/dL (ref 65–99)
Potassium: 3.9 mmol/L (ref 3.5–5.2)
SODIUM: 141 mmol/L (ref 134–144)

## 2016-05-05 ENCOUNTER — Ambulatory Visit: Payer: No Typology Code available for payment source | Admitting: Internal Medicine

## 2016-06-02 ENCOUNTER — Ambulatory Visit: Payer: No Typology Code available for payment source | Admitting: Internal Medicine

## 2016-06-04 ENCOUNTER — Other Ambulatory Visit: Payer: Self-pay | Admitting: Gastroenterology

## 2016-06-18 ENCOUNTER — Telehealth: Payer: Self-pay | Admitting: Family Medicine

## 2016-06-18 ENCOUNTER — Encounter: Payer: Self-pay | Admitting: Family Medicine

## 2016-06-18 ENCOUNTER — Ambulatory Visit (INDEPENDENT_AMBULATORY_CARE_PROVIDER_SITE_OTHER): Payer: Self-pay | Admitting: Family Medicine

## 2016-06-18 VITALS — BP 137/78 | HR 70 | Temp 97.0°F | Ht 61.0 in | Wt 206.2 lb

## 2016-06-18 DIAGNOSIS — E1142 Type 2 diabetes mellitus with diabetic polyneuropathy: Secondary | ICD-10-CM

## 2016-06-18 DIAGNOSIS — Z23 Encounter for immunization: Secondary | ICD-10-CM

## 2016-06-18 LAB — BAYER DCA HB A1C WAIVED: HB A1C: 6.6 % (ref <7.0)

## 2016-06-18 MED ORDER — GABAPENTIN 100 MG PO CAPS: 100.0000 mg | ORAL_CAPSULE | Freq: Three times a day (TID) | ORAL | 3 refills | Status: DC

## 2016-06-18 NOTE — Progress Notes (Signed)
   HPI  Patient presents today here for foot numbness.  Patient states that she's had this issue for several months. She explains she has right greater than left foot numbness and tingling across all 5 toes. She also has some numbness in her hands that's been long-standing. Sees regarding worse over the last 2 months. She is seen her podiatrist, Dr. Irving Shows, who has placed to steroid injections in her feet with some improvement in the first but not much in the second.  She previously tried gabapentin with some improvement and would like to try again.  She is self-pay, she states that she has applied for Medicare but is not old enough yet. She does not know about Medicaid.  Diabetes Average fasting blood sugars 125-150 No hypoglycemia Had diarrhea with metformin and glipizide, thinks she can tolerate glyburide. States that she may be on glucuronide, however it is not prescribed currently.    PMH: Smoking status noted ROS: Per HPI  Objective: BP 137/78   Pulse 70   Temp 97 F (36.1 C) (Oral)   Ht 5\' 1"  (1.549 m)   Wt 206 lb 3.2 oz (93.5 kg)   BMI 38.96 kg/m  Gen: NAD, alert, cooperative with exam HEENT: NCAT CV: RRR, good S1/S2, no murmur Resp: CTABL, no wheezes, non-labored Ext: No edema, warm Neuro: Alert and oriented, strength 5/5 and sensation intact in bilateral lower extremities Negative modified straight leg raise.   Diabetic Foot Exam - Simple   Simple Foot Form Visual Inspection No deformities, no ulcerations, no other skin breakdown bilaterally:  Yes Sensation Testing See comments:  Yes Pulse Check Posterior Tibialis and Dorsalis pulse intact bilaterally:  Yes Comments Right plantar surface with decreased sensation to monofilament on the medial forefoot and heel, intact on the lateral forefoot Normal sensation on the left lower extremity        Assessment and plan:  # Diabetes, type II Previously controlled with no medications. Previous A1c 7.7,  pending today Patient possibly on glyburide, will check her medicine cabinet to see if she is arty taking it  # Diabetic neuropathy Starting gabapentin today Present for several months but worsening over the last 1 month.     Laroy Apple, MD [facility] 06/18/2016, 9:51 AM

## 2016-06-18 NOTE — Patient Instructions (Addendum)
Great to see you!  Start gabapentin 1 pill 3 times daily.

## 2016-06-19 ENCOUNTER — Ambulatory Visit: Payer: No Typology Code available for payment source | Admitting: Internal Medicine

## 2016-06-19 ENCOUNTER — Ambulatory Visit: Payer: No Typology Code available for payment source | Admitting: Urology

## 2016-06-19 ENCOUNTER — Telehealth: Payer: Self-pay | Admitting: Internal Medicine

## 2016-06-19 NOTE — Telephone Encounter (Signed)
Patient aware of results.

## 2016-06-19 NOTE — Telephone Encounter (Signed)
Pt called to reschedule her OV with RMR for today. She is aware of her new appt date and time. She also wanted the nurse/RMR to know that the prednisone hasn't helped her any and she still has diarrhea

## 2016-06-22 ENCOUNTER — Telehealth: Payer: Self-pay | Admitting: Family Medicine

## 2016-06-22 NOTE — Telephone Encounter (Signed)
Pt called

## 2016-06-22 NOTE — Telephone Encounter (Signed)
Per pt, Gabapentin 100mg  TID is not working Please advise

## 2016-06-22 NOTE — Telephone Encounter (Signed)
Can increase to 200 TID ( 2 capsules)  Laroy Apple, MD [facility] 06/22/2016, 12:50 PM

## 2016-06-29 ENCOUNTER — Ambulatory Visit: Payer: Self-pay | Admitting: Gastroenterology

## 2016-07-03 ENCOUNTER — Ambulatory Visit (INDEPENDENT_AMBULATORY_CARE_PROVIDER_SITE_OTHER): Payer: Self-pay | Admitting: Internal Medicine

## 2016-07-03 ENCOUNTER — Encounter: Payer: Self-pay | Admitting: Internal Medicine

## 2016-07-03 VITALS — BP 144/81 | HR 79 | Temp 97.8°F | Ht 62.0 in | Wt 204.0 lb

## 2016-07-03 DIAGNOSIS — R197 Diarrhea, unspecified: Secondary | ICD-10-CM

## 2016-07-03 MED ORDER — PREDNISONE 20 MG PO TABS: 20.0000 mg | ORAL_TABLET | Freq: Every day | ORAL | 0 refills | Status: DC

## 2016-07-03 NOTE — Patient Instructions (Addendum)
Prednisone 20 mg daily x 1 month; then taper (Will call in to [facility])  Will taper base on how you are doing in 3 weeks (call us in 3 weeks)  Risks of Prednisone discussed  May use Imodium daily as needed  Take Align daily  Further recommendations to follow

## 2016-07-03 NOTE — Progress Notes (Signed)
Primary Care Physician:  Kenn File, MD Primary Gastroenterologist:  Dr. Gala Romney  Pre-Procedure History & Physical: HPI:  Donna Rogers is a 65 y.o. female here for follow-up of lymphocytic colitis. Responded to prednisone earlier in the year. Took a course of antibiotics and develop recurrence diarrhea. C. difficile toxin assay came back negative. No significant improvement with a probiotic including Align. Has watery diarrhea -  multiple stools daily; really not able to leave the house because of diarrhea. Prednisone impaired her glucose control. Cannot afford Entocort.  I discussed other touted treatments for microscopic colitis including Pepto-Bismol and mesalamine -  really have very if, if any, efficacy.  Past Medical History:  Diagnosis Date  . Anemia   . Arthritis    rheumatoid  . Depression   . Diabetes mellitus without complication ([place])   . GERD (gastroesophageal reflux disease)   . Headache   . Hyperlipidemia   . Hypertension    pt. denies  . Shortness of breath dyspnea    with exertion    Past Surgical History:  Procedure Laterality Date  . BREAST LUMPECTOMY WITH RADIOACTIVE SEED LOCALIZATION Left 03/03/2016   Procedure: BREAST LUMPECTOMY WITH RADIOACTIVE SEED LOCALIZATION;  Surgeon: Coralie Keens, MD;  Location: [facility];  Service: General;  Laterality: Left;  . COLONOSCOPY  08/2002   RMR: internal hemorrhoids  . COLONOSCOPY N/A 10/21/2015   Procedure: COLONOSCOPY;  Surgeon: Daneil Dolin, MD;  [facility];  Service: Endoscopy;  Laterality: N/A;  250 - moved to 2:35 - office to notify pt  . CYST REMOVAL TRUNK    . ESOPHAGOGASTRODUODENOSCOPY  08/2002   RMR: nonerosive reflux esophagitis, hiatal hernia  . TONSILLECTOMY      Prior to Admission medications   Medication Sig Start Date End Date Taking? Authorizing Provider  amitriptyline (ELAVIL) 50 MG tablet TAKE 1 TABLET (50 MG TOTAL) BY MOUTH AT BEDTIME. 06/09/16  Yes Historical Provider, MD    aspirin 81 MG tablet Take 81 mg by mouth daily.   Yes Historical Provider, MD  atenolol (TENORMIN) 25 MG tablet Take 1 tablet (25 mg total) by mouth daily. 04/10/16  Yes Timmothy Euler, MD  CALCIUM-MAG-VIT C-VIT D PO Take 1 tablet by mouth daily.    Yes Historical Provider, MD  cholecalciferol (VITAMIN D) 1000 UNITS tablet Take 1,000 Units by mouth daily.   Yes Historical Provider, MD  citalopram (CELEXA) 40 MG tablet Take 1 tablet (40 mg total) by mouth daily. 10/10/15  Yes Timmothy Euler, MD  diclofenac (VOLTAREN) 75 MG EC tablet Take 75 mg by mouth daily.   Yes Historical Provider, MD  fish oil-omega-3 fatty acids 1000 MG capsule Take 1 g by mouth daily.    Yes Historical Provider, MD  gabapentin (NEURONTIN) 100 MG capsule Take 1 capsule (100 mg total) by mouth 3 (three) times daily. 06/18/16  Yes Timmothy Euler, MD  glyBURIDE (DIABETA) 5 MG tablet Take 5 mg by mouth 2 (two) times daily with a meal.   Yes Historical Provider, MD  HYDROcodone-acetaminophen (NORCO/VICODIN) 5-325 MG per tablet Take 1 tablet by mouth every 6 (six) hours as needed. Patient taking differently: Take 1 tablet by mouth every 6 (six) hours as needed for moderate pain.  07/19/14  Yes Lysbeth Penner, FNP  ibuprofen (ADVIL,MOTRIN) 200 MG tablet Take 800 mg by mouth every 6 (six) hours as needed for moderate pain.   Yes Historical Provider, MD  omeprazole (PRILOSEC) 20 MG capsule TAKE 1  CAPSULE EVERY DAY 07/19/14  Yes Lysbeth Penner, FNP  simvastatin (ZOCOR) 40 MG tablet Take 1 tablet (40 mg total) by mouth daily. 10/11/15  Yes Timmothy Euler, MD  sulfaSALAzine (AZULFIDINE) 500 MG tablet Take 1,000 mg by mouth 2 (two) times daily. Reported on 11/28/2015   Yes Historical Provider, MD  vitamin C (ASCORBIC ACID) 500 MG tablet Take 500 mg by mouth daily.   Yes Historical Provider, MD  predniSONE (DELTASONE) 20 MG tablet Take 1 tablet (20 mg total) by mouth daily with breakfast. For one month 07/03/16   Daneil Dolin, MD     Allergies as of 07/03/2016 - Review Complete 07/03/2016  Allergen Reaction Noted  . Ciprofloxacin hcl Rash 11/18/2012  . Glipizide Diarrhea 02/03/2016    Family History  Problem Relation Age of Onset  . Alzheimer's disease Mother   . Cancer Mother     lung  . Diabetes Father   . Colon cancer Neg Hx     Social History   Social History  . Marital status: Legally Separated    Spouse name: N/A  . Number of children: 1  . Years of education: N/A   Occupational History  . taking care of parents    Social History Main Topics  . Smoking status: Current Every Day Smoker    Packs/day: 1.00    Years: 36.00    Types: Cigarettes  . Smokeless tobacco: Current User     Comment: less than a pack daily  . Alcohol use No  . Drug use: No  . Sexual activity: Not on file   Other Topics Concern  . Not on file   Social History Narrative  . No narrative on file    Review of Systems: See HPI, otherwise negative ROS  Physical Exam: BP (!) 144/81   Pulse 79   Temp 97.8 F (36.6 C) (Oral)   Ht 5\' 2"  (1.575 m)   Wt 204 lb (92.5 kg)   BMI 37.31 kg/m  General:   Alert,  Well-developed, well-nourished, pleasant and cooperative in NAD Neck:  Supple; no masses or thyromegaly. No significant cervical adenopathy. Lungs:  Clear throughout to auscultation.   No wheezes, crackles, or rhonchi. No acute distress. Heart:  Regular rate and rhythm; no murmurs, clicks, rubs,  or gallops. Abdomen: Non-distended, normal bowel sounds.  Soft and nontender without appreciable mass or hepatosplenomegaly.  Pulses:  Normal pulses noted. Extremities:  Without clubbing or edema.  Impression:  Pleasant 65 year old lady with well-documented lymphocytic colitis. Was in remission earlier this year after course of prednisone. Course of antibiotic therapy temporally associate recurrent diarrhea. Subsequent C. difficile toxin assay negative. No improvement with align. Likely has active lymphocytic colitis.  Entocort would be more ideal but cost is prohibitive. We talked about the risks and benefits of prednisone along with a lack of efficacy of other agents such as Pepto-Bismol and mesalamine. We'll get her back on a prednisone regimen utilizing the lowest effective dose possible to get the job done with the minimal interference with glycemic control.  Recommendations:   Prednisone 20 mg daily x 1 month; then taper (Will call in to [facility])  Will taper base on she is doing in 3 weeks (call us in 3 weeks)  Risks of Prednisone discussed  May use Imodium daily as needed  Take Align daily  Further recommendations to follow       Notice: This dictation was prepared with Dragon dictation along with smaller phrase technology. Any  transcriptional errors that result from this process are unintentional and may not be corrected upon review.

## 2016-07-07 ENCOUNTER — Telehealth: Payer: Self-pay | Admitting: Family Medicine

## 2016-07-07 MED ORDER — GABAPENTIN 300 MG PO CAPS: 600.0000 mg | ORAL_CAPSULE | Freq: Two times a day (BID) | ORAL | 1 refills | Status: DC

## 2016-07-07 NOTE — Telephone Encounter (Signed)
Pt states she has been taking 400mg  once daily and she needs something stronger.

## 2016-07-07 NOTE — Addendum Note (Signed)
Addended by: Timmothy Euler on: 07/07/2016 05:25 PM   Modules accepted: Orders

## 2016-07-07 NOTE — Telephone Encounter (Signed)
Changing gabapentin to 600 mg BID, will start at night. Tolerating 400 mg easily without clear effect.   Discussed possibility of somnolence  Laroy Apple, MD [facility] 07/07/2016, 5:25 PM

## 2016-08-14 ENCOUNTER — Telehealth: Payer: Self-pay

## 2016-08-14 MED ORDER — PREDNISONE 5 MG PO TABS: ORAL_TABLET | ORAL | 0 refills | Status: DC

## 2016-08-14 NOTE — Telephone Encounter (Signed)
Pt called to see about getting more Prednisone. She has one pill left for tomorrow (20 mg). She is wanting to know what the next step would be to wean her off of it. She is doing good. She goes to [facility]. Please advise

## 2016-08-14 NOTE — Telephone Encounter (Signed)
Pt is aware.  

## 2016-08-14 NOTE — Telephone Encounter (Signed)
Yes, we need to taper off.   I have sent in new prescription to taper:   Take 15 mg for one week 10 mg for 1 week Then 5 mg for 1 week and stop.   I have sent this to the pharmacy.

## 2016-08-17 HISTORY — PX: BREAST LUMPECTOMY: SHX2

## 2016-08-26 ENCOUNTER — Telehealth: Payer: Self-pay | Admitting: Internal Medicine

## 2016-08-26 NOTE — Telephone Encounter (Signed)
Spoke with the pt, she said when she was taking prednisone 20mg  she was doing good, when she started the taper of 15mg  and now she is on 10mg , she has constant diarrhea all day long. 7-8x today. She ran out of imodium and hasnt taken it in a few days but it wasn't helping a lot anyway. No blood in her stool. No recent abx.  Routing to LSL- pt last saw RMR but he is out of the office, saw LSL prior to last ov.

## 2016-08-26 NOTE — Telephone Encounter (Signed)
Patient called with questions about her prednisone and said that the medicine wasn't helping any. Please call [phone]

## 2016-08-28 MED ORDER — PREDNISONE 5 MG PO TABS: ORAL_TABLET | ORAL | 0 refills | Status: DC

## 2016-08-28 NOTE — Addendum Note (Signed)
Addended by: Mahala Menghini on: 08/28/2016 12:22 PM   Modules accepted: Orders

## 2016-08-28 NOTE — Telephone Encounter (Signed)
Stool was trying to form when on prednisone 20mg  daily. As soon as she started 15mg  daily the watery stools returned.   Advised patient to go back to 20mg  daily. New RX sent to [facility].  Almyra Free, there is a Film/video editor, ?[facility] or [place]. We need to find out how much 9mg  daily for one month would cost. This may be better option for patient as it appears that she will not be able to come off prednisone easily.

## 2016-08-31 NOTE — Telephone Encounter (Signed)
$  65 for a 30 day supply from [place].

## 2016-09-01 NOTE — Telephone Encounter (Signed)
Please touch base with patient and find out if she can afford entocort at $65 per month for 2-3 months. If she can, I would write for it and taper prednisone. If not, she already has new prednisone taper.   Needs ov in 4 weeks.

## 2016-09-07 ENCOUNTER — Other Ambulatory Visit: Payer: Self-pay | Admitting: Family Medicine

## 2016-09-07 MED ORDER — CEFDINIR 300 MG PO CAPS: 300.0000 mg | ORAL_CAPSULE | Freq: Two times a day (BID) | ORAL | 0 refills | Status: DC

## 2016-09-07 NOTE — Telephone Encounter (Signed)
Pt aware.

## 2016-09-07 NOTE — Telephone Encounter (Signed)
Ok considering circumstances.   Omnicef, have discussed with pt previously dangers with worsening diarrhea with antibiotics.   Laroy Apple, MD [facility] 09/07/2016, 10:52 AM

## 2016-09-10 ENCOUNTER — Other Ambulatory Visit: Payer: Self-pay | Admitting: Family Medicine

## 2016-09-16 NOTE — Telephone Encounter (Signed)
Tried to call pt- NA-LMOM with recommendations. Asked her to call back and let me know about the entocort. Please schedule ov.

## 2016-09-16 NOTE — Telephone Encounter (Signed)
OV made and appt card mailed °

## 2016-09-21 ENCOUNTER — Other Ambulatory Visit: Payer: Self-pay | Admitting: Family Medicine

## 2016-10-06 ENCOUNTER — Ambulatory Visit: Payer: Self-pay | Admitting: Internal Medicine

## 2016-10-13 ENCOUNTER — Telehealth: Payer: Self-pay | Admitting: *Deleted

## 2016-10-13 MED ORDER — SIMVASTATIN 40 MG PO TABS: 40.0000 mg | ORAL_TABLET | Freq: Every day | ORAL | 0 refills | Status: DC

## 2016-10-13 NOTE — Telephone Encounter (Signed)
done

## 2016-10-17 ENCOUNTER — Other Ambulatory Visit: Payer: Self-pay | Admitting: Family Medicine

## 2016-10-31 ENCOUNTER — Other Ambulatory Visit: Payer: Self-pay | Admitting: Family Medicine

## 2016-11-17 ENCOUNTER — Other Ambulatory Visit: Payer: Self-pay | Admitting: Family Medicine

## 2016-11-17 NOTE — Telephone Encounter (Signed)
I do not see on patient's medication list. Please review  

## 2016-11-19 ENCOUNTER — Other Ambulatory Visit: Payer: Self-pay | Admitting: Family Medicine

## 2016-11-30 ENCOUNTER — Other Ambulatory Visit: Payer: Self-pay | Admitting: Family Medicine

## 2016-12-02 ENCOUNTER — Other Ambulatory Visit: Payer: Self-pay | Admitting: Family Medicine

## 2016-12-16 ENCOUNTER — Ambulatory Visit (INDEPENDENT_AMBULATORY_CARE_PROVIDER_SITE_OTHER): Payer: Self-pay | Admitting: Family Medicine

## 2016-12-16 ENCOUNTER — Encounter: Payer: Self-pay | Admitting: Family Medicine

## 2016-12-16 VITALS — BP 138/77 | HR 74 | Temp 97.7°F | Ht 62.0 in | Wt 216.0 lb

## 2016-12-16 DIAGNOSIS — R399 Unspecified symptoms and signs involving the genitourinary system: Secondary | ICD-10-CM

## 2016-12-16 DIAGNOSIS — N3090 Cystitis, unspecified without hematuria: Secondary | ICD-10-CM

## 2016-12-16 DIAGNOSIS — G629 Polyneuropathy, unspecified: Secondary | ICD-10-CM

## 2016-12-16 DIAGNOSIS — K52832 Lymphocytic colitis: Secondary | ICD-10-CM

## 2016-12-16 LAB — MICROSCOPIC EXAMINATION
Epithelial Cells (non renal): >10 /hpf — AB (ref 0–10)
RENAL EPITHEL UA: NONE SEEN /HPF

## 2016-12-16 LAB — URINALYSIS, COMPLETE
BILIRUBIN UA: NEGATIVE
GLUCOSE, UA: NEGATIVE
Ketones, UA: NEGATIVE
Leukocytes, UA: NEGATIVE
Nitrite, UA: NEGATIVE
PH UA: 5.5 (ref 5.0–7.5)
PROTEIN UA: NEGATIVE
Specific Gravity, UA: 1.015 (ref 1.005–1.030)
Urobilinogen, Ur: 0.2 mg/dL (ref 0.2–1.0)

## 2016-12-16 MED ORDER — GABAPENTIN 300 MG PO CAPS: 900.0000 mg | ORAL_CAPSULE | Freq: Two times a day (BID) | ORAL | 0 refills | Status: DC

## 2016-12-16 MED ORDER — NITROFURANTOIN MONOHYD MACRO 100 MG PO CAPS: 100.0000 mg | ORAL_CAPSULE | Freq: Two times a day (BID) | ORAL | 0 refills | Status: DC

## 2016-12-16 NOTE — Progress Notes (Signed)
Subjective:  Patient ID: Donna Rogers, female    DOB: [birth year]  Age: 66 y.o. MRN: [id number]  CC: Dysuria (pt here today c/o dysuria and urinary urgency. She also is c/o swelling in her feet and legs as well as her joints in her hands and feet.)   HPI GLADIES SOFRANKO presents for burning with urination and frequency for several days. Denies fever . No flank pain. No nausea, vomiting. Patient also states that her feet have been hurting constantly. They wake her up at night. They've been swelling. She is taking gabapentin 2 by mouth twice a day. That seems to have reduced the pain in the past. She does have a history of rheumatoid arthritis.  History Kanani has a past medical history of Anemia; Arthritis; Depression; Diabetes mellitus without complication ([place]); GERD (gastroesophageal reflux disease); Headache; Hyperlipidemia; Hypertension; and Shortness of breath dyspnea.   She has a past surgical history that includes Tonsillectomy; Cyst removal trunk; Esophagogastroduodenoscopy (08/2002); Colonoscopy (08/2002); Colonoscopy (N/A, 10/21/2015); and Breast lumpectomy with radioactive seed localization (Left, 03/03/2016).   Her family history includes Alzheimer's disease in her mother; Cancer in her mother; Diabetes in her father.She reports that she has been smoking Cigarettes.  She has a 36.00 pack-year smoking history. She uses smokeless tobacco. She reports that she does not drink alcohol or use drugs.  Current Outpatient Prescriptions on File Prior to Visit  Medication Sig Dispense Refill  . amitriptyline (ELAVIL) 50 MG tablet TAKE 1 TABLET (50 MG TOTAL) BY MOUTH AT BEDTIME.  3  . aspirin 81 MG tablet Take 81 mg by mouth daily.    Marland Kitchen atenolol (TENORMIN) 25 MG tablet Take 1 Tablet by mouth once daily 30 tablet 3  . CALCIUM-MAG-VIT C-VIT D PO Take 1 tablet by mouth daily.     . cholecalciferol (VITAMIN D) 1000 UNITS tablet Take 1,000 Units by mouth daily.    . citalopram (CELEXA) 40 MG  tablet TAKE 1 TABLET (40 MG TOTAL) BY MOUTH DAILY. 30 tablet 10  . diclofenac (VOLTAREN) 75 MG EC tablet Take 75 mg by mouth daily.    . fish oil-omega-3 fatty acids 1000 MG capsule Take 1 g by mouth daily.     Marland Kitchen glipiZIDE (GLUCOTROL) 5 MG tablet TAKE 1 TABLET (5 MG TOTAL) BY MOUTH 2 (TWO) TIMES DAILY BEFORE A MEAL. 60 tablet 0  . glyBURIDE (DIABETA) 5 MG tablet Take 5 mg by mouth 2 (two) times daily with a meal.    . HYDROcodone-acetaminophen (NORCO/VICODIN) 5-325 MG per tablet Take 1 tablet by mouth every 6 (six) hours as needed. (Patient taking differently: Take 1 tablet by mouth every 6 (six) hours as needed for moderate pain. ) 60 tablet 0  . ibuprofen (ADVIL,MOTRIN) 200 MG tablet Take 800 mg by mouth every 6 (six) hours as needed for moderate pain.    Marland Kitchen omeprazole (PRILOSEC) 20 MG capsule TAKE 1 CAPSULE EVERY DAY 90 capsule 3  . simvastatin (ZOCOR) 40 MG tablet Take 1 tablet (40 mg total) by mouth daily. 30 tablet 0  . sulfaSALAzine (AZULFIDINE) 500 MG tablet Take 1,000 mg by mouth 2 (two) times daily. Reported on 11/28/2015    . vitamin C (ASCORBIC ACID) 500 MG tablet Take 500 mg by mouth daily.     No current facility-administered medications on file prior to visit.     ROS Review of Systems  Constitutional: Negative for activity change, appetite change and fever.  HENT: Negative for congestion, rhinorrhea and sore throat.  Eyes: Negative for visual disturbance.  Respiratory: Negative for cough and shortness of breath.   Cardiovascular: Negative for chest pain and palpitations.  Gastrointestinal: Positive for abdominal distention and abdominal pain. Negative for diarrhea and nausea.  Genitourinary: Negative for dysuria.  Musculoskeletal: Positive for arthralgias and joint swelling. Negative for myalgias.    Objective:  BP (!) 142/71   Pulse 74   Temp 97.7 F (36.5 C) (Oral)   Ht 5\' 2"  (1.575 m)   Wt 216 lb (98 kg)   BMI 39.51 kg/m   Physical Exam  Constitutional: She is  oriented to person, place, and time. She appears well-developed and well-nourished. No distress.  HENT:  Head: Normocephalic and atraumatic.  Right Ear: External ear normal.  Left Ear: External ear normal.  Nose: Nose normal.  Mouth/Throat: Oropharynx is clear and moist.  Eyes: Conjunctivae and EOM are normal. Pupils are equal, round, and reactive to light.  Neck: Normal range of motion. Neck supple. No thyromegaly present.  Cardiovascular: Normal rate, regular rhythm and normal heart sounds.   No murmur heard. Pulmonary/Chest: Effort normal and breath sounds normal. No respiratory distress. She has no wheezes. She has no rales.  Abdominal: Soft. Bowel sounds are normal. She exhibits no distension. There is tenderness.  Lymphadenopathy:    She has no cervical adenopathy.  Neurological: She is alert and oriented to person, place, and time. She has normal reflexes.  Skin: Skin is warm and dry.  Psychiatric: She has a normal mood and affect. Her behavior is normal. Judgment and thought content normal.    Assessment & Plan:   Callia was seen today for dysuria.  Diagnoses and all orders for this visit:  UTI symptoms -     Urinalysis, Complete  Lymphocytic colitis -     Ambulatory referral to Gastroenterology  Cystitis  Other orders -     nitrofurantoin, macrocrystal-monohydrate, (MACROBID) 100 MG capsule; Take 1 capsule (100 mg total) by mouth 2 (two) times daily. -     gabapentin (NEURONTIN) 300 MG capsule; Take 3 capsules (900 mg total) by mouth 2 (two) times daily.   I have discontinued Ms. Mullings's predniSONE and cefdinir. I have also changed her gabapentin. Additionally, I am having her start on nitrofurantoin (macrocrystal-monohydrate). Lastly, I am having her maintain her diclofenac, fish oil-omega-3 fatty acids, HYDROcodone-acetaminophen, omeprazole, CALCIUM-MAG-VIT C-VIT D PO, vitamin C, cholecalciferol, aspirin, sulfaSALAzine, ibuprofen, glyBURIDE, amitriptyline,  atenolol, citalopram, simvastatin, and glipiZIDE.  Meds ordered this encounter  Medications  . nitrofurantoin, macrocrystal-monohydrate, (MACROBID) 100 MG capsule    Sig: Take 1 capsule (100 mg total) by mouth 2 (two) times daily.    Dispense:  14 capsule    Refill:  0  . gabapentin (NEURONTIN) 300 MG capsule    Sig: Take 3 capsules (900 mg total) by mouth 2 (two) times daily.    Dispense:  180 capsule    Refill:  0     Follow-up: Return in about 2 weeks (around 12/30/2016) for RA & Diarrhea & neuropathy, Dr. Wendi Snipes.  Claretta Fraise, M.D.

## 2016-12-17 ENCOUNTER — Encounter: Payer: Self-pay | Admitting: Internal Medicine

## 2017-01-06 ENCOUNTER — Other Ambulatory Visit: Payer: Self-pay | Admitting: Family Medicine

## 2017-01-07 ENCOUNTER — Ambulatory Visit: Payer: Self-pay | Admitting: Gastroenterology

## 2017-01-12 ENCOUNTER — Other Ambulatory Visit: Payer: Self-pay | Admitting: Family Medicine

## 2017-01-21 ENCOUNTER — Other Ambulatory Visit: Payer: Self-pay | Admitting: Family Medicine

## 2017-01-21 DIAGNOSIS — G629 Polyneuropathy, unspecified: Secondary | ICD-10-CM

## 2017-01-31 ENCOUNTER — Other Ambulatory Visit: Payer: Self-pay | Admitting: Family Medicine

## 2017-02-02 ENCOUNTER — Other Ambulatory Visit: Payer: Self-pay | Admitting: Family Medicine

## 2017-02-02 ENCOUNTER — Telehealth: Payer: Self-pay | Admitting: Family Medicine

## 2017-02-02 MED ORDER — GLYBURIDE 2.5 MG PO TABS: ORAL_TABLET | ORAL | 0 refills | Status: DC

## 2017-02-02 NOTE — Telephone Encounter (Signed)
RX changed to [facility] per pt request

## 2017-02-22 ENCOUNTER — Ambulatory Visit (INDEPENDENT_AMBULATORY_CARE_PROVIDER_SITE_OTHER): Payer: Self-pay | Admitting: Family Medicine

## 2017-02-22 ENCOUNTER — Encounter: Payer: Self-pay | Admitting: Family Medicine

## 2017-02-22 VITALS — BP 131/85 | HR 76 | Temp 97.8°F | Ht 62.0 in | Wt 206.2 lb

## 2017-02-22 DIAGNOSIS — E1142 Type 2 diabetes mellitus with diabetic polyneuropathy: Secondary | ICD-10-CM

## 2017-02-22 DIAGNOSIS — K2100 Gastro-esophageal reflux disease with esophagitis, without bleeding: Secondary | ICD-10-CM

## 2017-02-22 DIAGNOSIS — G629 Polyneuropathy, unspecified: Secondary | ICD-10-CM

## 2017-02-22 MED ORDER — LORAZEPAM 0.5 MG PO TABS: 0.5000 mg | ORAL_TABLET | Freq: Two times a day (BID) | ORAL | 1 refills | Status: DC | PRN

## 2017-02-22 MED ORDER — GLIPIZIDE 5 MG PO TABS
5.0000 mg | ORAL_TABLET | Freq: Two times a day (BID) | ORAL | 3 refills | Status: DC
Start: 2017-02-22 — End: 2017-07-29

## 2017-02-22 MED ORDER — OMEPRAZOLE 20 MG PO CPDR: DELAYED_RELEASE_CAPSULE | ORAL | 3 refills | Status: DC

## 2017-02-22 MED ORDER — GABAPENTIN 300 MG PO CAPS: 900.0000 mg | ORAL_CAPSULE | Freq: Two times a day (BID) | ORAL | 3 refills | Status: DC

## 2017-02-22 MED ORDER — SIMVASTATIN 40 MG PO TABS: 40.0000 mg | ORAL_TABLET | Freq: Every day | ORAL | 3 refills | Status: DC

## 2017-02-22 NOTE — Patient Instructions (Signed)
  I have prescribed Ativan today, this is an anxiety medication that is intended to be a short-term medicine to help you through your recent loss. Do not drive after taking it.  Let's plan to see you again in 3-4 months.

## 2017-02-22 NOTE — Progress Notes (Signed)
HPI  Self pay patient, we discussed labs extensively due to cost.   Patient presents today here with anxiety and for follow-up of chronic medical conditions. Type 2 diabetes Average fasting blood sugar is 130 140s No hypoglycemia Watching diet Has severe right-sided neuropathy with cold foot sensation. No actual temperature change. Also has some right foot swelling. Has not tried stockings. This is been going on more than 6 months.  Gabapentin does help above symptoms.  GERD Needs refill, Prilosec is helpful.  Anxiety She lost her mother 6 days ago, she states that she's having a difficult time emotionally and ask for something to help with anxiety.  PMH: Smoking status noted ROS: Per HPI  Objective: BP 131/85   Pulse 76   Temp 97.8 F (36.6 C) (Oral)   Ht 5' 2"  (1.575 m)   Wt 206 lb 3.2 oz (93.5 kg)   BMI 37.71 kg/m  Gen: NAD, alert, cooperative with exam HEENT: NCAT CV: RRR, good S1/S2, no murmur Resp: CTABL, no wheezes, non-labored Ext: No edema, warm Neuro: Alert and oriented, No gross deficits  Diabetic Foot Exam - Simple   Simple Foot Form Diabetic Foot exam was performed with the following findings:  Yes 02/22/2017 11:04 AM  Visual Inspection No deformities, no ulcerations, no other skin breakdown bilaterally:  Yes Sensation Testing See comments:  Yes Pulse Check Posterior Tibialis and Dorsalis pulse intact bilaterally:  Yes Comments Right forefoot with sensation absent to monofilament, otherwise intact throughout     Depression screen [facility] 2/9 02/22/2017 12/16/2016 06/18/2016 04/29/2016 04/10/2016  Decreased Interest 3 3 3  0 0  Down, Depressed, Hopeless 3 1 2  0 0  PHQ - 2 Score 6 4 5  0 0  Altered sleeping 2 3 3  - -  Tired, decreased energy 3 3 3  - -  Change in appetite 3 1 0 - -  Feeling bad or failure about yourself  0 0 1 - -  Trouble concentrating 3 0 0 - -  Moving slowly or fidgety/restless 3 0 0 - -  Suicidal thoughts 0 0 0 - -  PHQ-9 Score 20  11 12  - -  Difficult doing work/chores Not difficult at all - Not difficult at all - -    Assessment and plan:  # type 2 diabetes Previous A1c well controlled, avoiding repeating today given no change in medications and self pay.  + Neuropathy- refilled gabapentin Refill meds, no hypoglycemia Li[pid panekl and CMP, no lipid panel on file and she is taking simvastatin, it is indicated given DM2 but will check today  # GERD Stable Refill proilosec  # Anxiety situational anxiety and depression given recent loss of her mother Crying in exam Short term ativan given.     Orders Placed This Encounter  Procedures  . CMP14+EGFR  . Lipid panel    Meds ordered this encounter  Medications  . glipiZIDE (GLUCOTROL) 5 MG tablet    Sig: Take 1 tablet (5 mg total) by mouth 2 (two) times daily before a meal.    Dispense:  60 tablet    Refill:  3  . gabapentin (NEURONTIN) 300 MG capsule    Sig: Take 3 capsules (900 mg total) by mouth 2 (two) times daily.    Dispense:  180 capsule    Refill:  3  . omeprazole (PRILOSEC) 20 MG capsule    Sig: TAKE 1 CAPSULE EVERY DAY    Dispense:  90 capsule    Refill:  3  .  simvastatin (ZOCOR) 40 MG tablet    Sig: Take 1 tablet (40 mg total) by mouth daily.    Dispense:  90 tablet    Refill:  3  . LORazepam (ATIVAN) 0.5 MG tablet    Sig: Take 1 tablet (0.5 mg total) by mouth 2 (two) times daily as needed for anxiety.    Dispense:  30 tablet    Refill:  [place], MD [facility] 02/22/2017, 11:07 AM

## 2017-02-23 LAB — CMP14+EGFR
A/G RATIO: 1.5 (ref 1.2–2.2)
ALT: 14 IU/L (ref 0–32)
AST: 16 IU/L (ref 0–40)
Albumin: 4.1 g/dL (ref 3.6–4.8)
Alkaline Phosphatase: 62 IU/L (ref 39–117)
BILIRUBIN TOTAL: 0.2 mg/dL (ref 0.0–1.2)
BUN/Creatinine Ratio: 16 (ref 12–28)
BUN: 12 mg/dL (ref 8–27)
CHLORIDE: 100 mmol/L (ref 96–106)
CO2: 26 mmol/L (ref 20–29)
Calcium: 9.4 mg/dL (ref 8.7–10.3)
Creatinine, Ser: 0.74 mg/dL (ref 0.57–1.00)
GFR calc Af Amer: 99 mL/min/{1.73_m2} (ref >59)
GFR calc non Af Amer: 86 mL/min/{1.73_m2} (ref >59)
Globulin, Total: 2.8 g/dL (ref 1.5–4.5)
Glucose: 135 mg/dL — ABNORMAL HIGH (ref 65–99)
POTASSIUM: 4.2 mmol/L (ref 3.5–5.2)
Sodium: 141 mmol/L (ref 134–144)
Total Protein: 6.9 g/dL (ref 6.0–8.5)

## 2017-02-23 LAB — LIPID PANEL
CHOLESTEROL TOTAL: 125 mg/dL (ref 100–199)
Chol/HDL Ratio: 4.5 ratio — ABNORMAL HIGH (ref 0.0–4.4)
HDL: 28 mg/dL — AB (ref >39)
LDL Calculated: 65 mg/dL (ref 0–99)
TRIGLYCERIDES: 159 mg/dL — AB (ref 0–149)
VLDL Cholesterol Cal: 32 mg/dL (ref 5–40)

## 2017-06-08 ENCOUNTER — Ambulatory Visit: Payer: Self-pay | Admitting: Family Medicine

## 2017-06-22 ENCOUNTER — Other Ambulatory Visit: Payer: Self-pay | Admitting: Family Medicine

## 2017-06-22 DIAGNOSIS — G629 Polyneuropathy, unspecified: Secondary | ICD-10-CM

## 2017-07-29 ENCOUNTER — Other Ambulatory Visit: Payer: Self-pay | Admitting: Family Medicine

## 2017-07-30 DIAGNOSIS — M0579 Rheumatoid arthritis with rheumatoid factor of multiple sites without organ or systems involvement: Secondary | ICD-10-CM | POA: Diagnosis not present

## 2017-08-12 ENCOUNTER — Other Ambulatory Visit: Payer: Self-pay | Admitting: Family Medicine

## 2017-08-12 DIAGNOSIS — G629 Polyneuropathy, unspecified: Secondary | ICD-10-CM

## 2017-08-26 ENCOUNTER — Ambulatory Visit (INDEPENDENT_AMBULATORY_CARE_PROVIDER_SITE_OTHER): Payer: Medicare Other | Admitting: Family Medicine

## 2017-08-26 ENCOUNTER — Ambulatory Visit (INDEPENDENT_AMBULATORY_CARE_PROVIDER_SITE_OTHER): Payer: Medicare Other

## 2017-08-26 ENCOUNTER — Encounter: Payer: Self-pay | Admitting: Family Medicine

## 2017-08-26 VITALS — BP 138/76 | HR 77 | Temp 97.6°F | Ht 62.0 in | Wt 203.8 lb

## 2017-08-26 DIAGNOSIS — M5442 Lumbago with sciatica, left side: Secondary | ICD-10-CM

## 2017-08-26 DIAGNOSIS — G629 Polyneuropathy, unspecified: Secondary | ICD-10-CM

## 2017-08-26 DIAGNOSIS — E1142 Type 2 diabetes mellitus with diabetic polyneuropathy: Secondary | ICD-10-CM

## 2017-08-26 DIAGNOSIS — K52832 Lymphocytic colitis: Secondary | ICD-10-CM

## 2017-08-26 DIAGNOSIS — M545 Low back pain: Secondary | ICD-10-CM | POA: Diagnosis not present

## 2017-08-26 LAB — BAYER DCA HB A1C WAIVED: HB A1C (BAYER DCA - WAIVED): 5.9 % (ref <7.0)

## 2017-08-26 MED ORDER — HYDROCORTISONE 2.5 % EX CREA: TOPICAL_CREAM | Freq: Two times a day (BID) | CUTANEOUS | 2 refills | Status: DC

## 2017-08-26 MED ORDER — GABAPENTIN 600 MG PO TABS: 1200.0000 mg | ORAL_TABLET | Freq: Two times a day (BID) | ORAL | 5 refills | Status: DC

## 2017-08-26 NOTE — Progress Notes (Signed)
HPI  Patient presents today here for follow-up chronic medical conditions as well as a few acute problems.   left-sided low back pain Patient reports about 3 weeks of left-sided low back pain with intermittent radiation down the left Posterior leg No leg weakness, no numbness or tingling.  Diabetic neuropathy has been helped well with 1200 mg of gabapentin twice daily, she increased the dose on her own but states that the pain is still there however it is managed better.  Type 2 diabetes Patient reports multiple hypoglycemic events with blood sugars in the 50s. She is not sure if she is taking glyburide, glipizide, or both. She is watching her diet and states that her appetite has been lower recently.  Lymphocytic colitis Patient states that she is unsatisfied with her results, she would like a second opinion and request a specific GI doctor. With frequent diarrheal episodes she has rectal irritation request hydrocortisone cream.  She also requests a handicap placard with difficulty walking more than 200 feet without resting, she does not believe this will be a long-term problem. She reports back pain in the neuropathy is the most likely etiologies.  PMH: Smoking status noted ROS: Per HPI  Objective: BP 138/76   Pulse 77   Temp 97.6 F (36.4 C) (Oral)   Ht _0  (1.575 m)   Wt 203 lb 12.8 oz (92.4 kg)   BMI 37.28 kg/m  Gen: NAD, alert, cooperative with exam HEENT: NCAT CV: RRR, good S1/S2 Resp: CTABL, no wheezes, non-labored Abd: SNTND, BS present, no guarding or organomegaly Ext: No edema, warm Neuro: Alert and oriented, No gross deficits  Assessment and plan:  #Type 2 diabetes A1c 5.9, discontinue glipizide if she is taking glyburide and glipizide, discontinue glyburide if she is taking only that. She will call back if taking both. labs  #Acute left-sided low back pain With sciatica, patient has hydrocodone from her rheumatologist, no additional pain  medications today Plain film with mild tenderness to palpation of the lumbar spine. No red flags Continue watchful waiting, could need referral to [facility] of this continues to be significant pain  #Neuropathy Helped by 1200 mg of gabapentin, increased dose and given new prescription.   Lymphocytic colitis GI referral placed Given hydrocortisone cream    Orders Placed This Encounter  Procedures  . DG Lumbar Spine 2-3 Views    Standing Status:   Future    Number of Occurrences:   1    Standing Expiration Date:   10/26/2018    Order Specific Question:   Reason for Exam (SYMPTOM  OR DIAGNOSIS REQUIRED)    Answer:   L sided sciatica    Order Specific Question:   Preferred imaging location?    Answer:   Internal  . Bayer DCA Hb A1c Waived  . CMP14+EGFR  . CBC with Differential/Platelet  . Lipid panel  . TSH  . Ambulatory referral to Gastroenterology    Referral Priority:   Routine    Referral Type:   Consultation    Referral Reason:   Specialty Services Required    Number of Visits Requested:   1    Meds ordered this encounter  Medications  . gabapentin (NEURONTIN) 600 MG tablet    Sig: Take 2 tablets (1,200 mg total) by mouth 2 (two) times daily.    Dispense:  120 tablet    Refill:  5  . hydrocortisone 2.5 % cream    Sig: Apply topically 2 (two) times daily. As needed  Dispense:  30 g    Refill:  [place], MD [facility] 08/26/2017, 11:42 AM

## 2017-08-27 LAB — LIPID PANEL
CHOLESTEROL TOTAL: 116 mg/dL (ref 100–199)
Chol/HDL Ratio: 4.3 ratio (ref 0.0–4.4)
HDL: 27 mg/dL — ABNORMAL LOW (ref >39)
LDL Calculated: 66 mg/dL (ref 0–99)
Triglycerides: 114 mg/dL (ref 0–149)
VLDL CHOLESTEROL CAL: 23 mg/dL (ref 5–40)

## 2017-08-27 LAB — CBC WITH DIFFERENTIAL/PLATELET
BASOS ABS: 0.1 10*3/uL (ref 0.0–0.2)
BASOS: 1 %
EOS (ABSOLUTE): 0.2 10*3/uL (ref 0.0–0.4)
Eos: 2 %
Hematocrit: 39.3 % (ref 34.0–46.6)
Hemoglobin: 13.1 g/dL (ref 11.1–15.9)
IMMATURE GRANS (ABS): 0 10*3/uL (ref 0.0–0.1)
Immature Granulocytes: 0 %
Lymphocytes Absolute: 3.6 10*3/uL — ABNORMAL HIGH (ref 0.7–3.1)
Lymphs: 39 %
MCH: 28.1 pg (ref 26.6–33.0)
MCHC: 33.3 g/dL (ref 31.5–35.7)
MCV: 84 fL (ref 79–97)
MONOS ABS: 0.7 10*3/uL (ref 0.1–0.9)
Monocytes: 7 %
NEUTROS ABS: 4.8 10*3/uL (ref 1.4–7.0)
Neutrophils: 51 %
Platelets: 291 10*3/uL (ref 150–379)
RBC: 4.66 x10E6/uL (ref 3.77–5.28)
RDW: 15.4 % (ref 12.3–15.4)
WBC: 9.3 10*3/uL (ref 3.4–10.8)

## 2017-08-27 LAB — CMP14+EGFR
ALK PHOS: 78 IU/L (ref 39–117)
ALT: 15 IU/L (ref 0–32)
AST: 16 IU/L (ref 0–40)
Albumin/Globulin Ratio: 1.3 (ref 1.2–2.2)
Albumin: 4 g/dL (ref 3.6–4.8)
BILIRUBIN TOTAL: 0.3 mg/dL (ref 0.0–1.2)
BUN/Creatinine Ratio: 13 (ref 12–28)
BUN: 11 mg/dL (ref 8–27)
CHLORIDE: 100 mmol/L (ref 96–106)
CO2: 26 mmol/L (ref 20–29)
CREATININE: 0.88 mg/dL (ref 0.57–1.00)
Calcium: 9.7 mg/dL (ref 8.7–10.3)
GFR calc Af Amer: 80 mL/min/{1.73_m2} (ref >59)
GFR calc non Af Amer: 69 mL/min/{1.73_m2} (ref >59)
GLUCOSE: 140 mg/dL — AB (ref 65–99)
Globulin, Total: 3.2 g/dL (ref 1.5–4.5)
Potassium: 4 mmol/L (ref 3.5–5.2)
Sodium: 138 mmol/L (ref 134–144)
Total Protein: 7.2 g/dL (ref 6.0–8.5)

## 2017-08-27 LAB — TSH: TSH: 1.15 u[IU]/mL (ref 0.450–4.500)

## 2017-08-30 ENCOUNTER — Telehealth: Payer: Self-pay | Admitting: Family Medicine

## 2017-08-30 NOTE — Telephone Encounter (Signed)
Patient aware of results.

## 2017-08-31 ENCOUNTER — Telehealth (INDEPENDENT_AMBULATORY_CARE_PROVIDER_SITE_OTHER): Payer: Self-pay | Admitting: Internal Medicine

## 2017-08-31 NOTE — Telephone Encounter (Signed)
Donna Rogers, this patient is my referral workqueue, she last saw [name] in 2017.  Please review chart and advise if I can schedule with our office or not.

## 2017-09-01 NOTE — Telephone Encounter (Signed)
No, this is [facility]. We are not taking this patient.

## 2017-09-01 NOTE — Telephone Encounter (Signed)
The referral was cancelled and a note sent back to the referring provider that Donna Castle, NP has reviewed this referral and the patient's chart.  Per Karna Christmas, this patient is a patient of Marylee Floras and the patient will need to be referred there.  We do not see each others patients.

## 2017-09-20 ENCOUNTER — Telehealth: Payer: Self-pay | Admitting: Internal Medicine

## 2017-09-20 ENCOUNTER — Telehealth: Payer: Self-pay | Admitting: Family Medicine

## 2017-09-20 NOTE — Telephone Encounter (Signed)
Referral in WQ from patient's PCP.  Spoke with patient and she is requesting to switch from Dr. Olevia Perches office to [facility] because she "feels like she is not getting any better"   Records placed on [name] desk for review (DOD on 08-26-17)

## 2017-09-20 NOTE — Telephone Encounter (Signed)
Left message , referral done back to [facility].  Gastro offices will not share the same patients. Speak to referral for more details.

## 2017-09-23 ENCOUNTER — Other Ambulatory Visit: Payer: Self-pay | Admitting: Family Medicine

## 2017-09-29 ENCOUNTER — Encounter: Payer: Self-pay | Admitting: Internal Medicine

## 2017-09-29 NOTE — Telephone Encounter (Signed)
Dr.Gessner reviewed records and states that he would follow the same plan and medication suggestions as Dr.Rourk. Notified patient and she still was persistent in seeing Dr.Gessner. Patient states she has left that practice and wants to see someone to help with her gi symptoms. Again notified pt that Dr.Gessner would likely suggest what Dr.Rourk did, but pt wanted to sch ov. Pt schedule for 4.2.19.

## 2017-09-30 ENCOUNTER — Other Ambulatory Visit: Payer: Self-pay | Admitting: Family Medicine

## 2017-10-04 ENCOUNTER — Other Ambulatory Visit: Payer: Self-pay

## 2017-10-04 ENCOUNTER — Emergency Department (HOSPITAL_COMMUNITY)
Admission: EM | Admit: 2017-10-04 | Discharge: 2017-10-04 | Disposition: A | Discharge status: Home / Self Care | Payer: Medicare Other | Attending: Emergency Medicine | Admitting: Emergency Medicine

## 2017-10-04 ENCOUNTER — Encounter (HOSPITAL_COMMUNITY): Payer: Self-pay

## 2017-10-04 ENCOUNTER — Emergency Department (HOSPITAL_COMMUNITY): Payer: Medicare Other

## 2017-10-04 DIAGNOSIS — Z7982 Long term (current) use of aspirin: Secondary | ICD-10-CM | POA: Insufficient documentation

## 2017-10-04 DIAGNOSIS — F17210 Nicotine dependence, cigarettes, uncomplicated: Secondary | ICD-10-CM | POA: Diagnosis not present

## 2017-10-04 DIAGNOSIS — E119 Type 2 diabetes mellitus without complications: Secondary | ICD-10-CM | POA: Insufficient documentation

## 2017-10-04 DIAGNOSIS — M25552 Pain in left hip: Secondary | ICD-10-CM | POA: Diagnosis not present

## 2017-10-04 DIAGNOSIS — I10 Essential (primary) hypertension: Secondary | ICD-10-CM | POA: Diagnosis not present

## 2017-10-04 DIAGNOSIS — M5432 Sciatica, left side: Secondary | ICD-10-CM

## 2017-10-04 DIAGNOSIS — Z79899 Other long term (current) drug therapy: Secondary | ICD-10-CM | POA: Diagnosis not present

## 2017-10-04 DIAGNOSIS — M79605 Pain in left leg: Secondary | ICD-10-CM | POA: Diagnosis present

## 2017-10-04 MED ORDER — PREDNISONE 20 MG PO TABS
60.0000 mg | ORAL_TABLET | Freq: Once | ORAL | Status: AC
  Administered 2017-10-04: 60 mg via ORAL
  Filled 2017-10-04: qty 3

## 2017-10-04 MED ORDER — OXYCODONE-ACETAMINOPHEN 5-325 MG PO TABS: 1.0000 | ORAL_TABLET | Freq: Four times a day (QID) | ORAL | 0 refills | Status: DC | PRN

## 2017-10-04 MED ORDER — PREDNISONE 20 MG PO TABS
ORAL_TABLET | ORAL | 0 refills | Status: DC
Start: 2017-10-05 — End: 2017-11-16

## 2017-10-04 MED ORDER — HYDROMORPHONE HCL 1 MG/ML IJ SOLN
1.0000 mg | Freq: Once | INTRAMUSCULAR | Status: AC
  Administered 2017-10-04: 1 mg via INTRAMUSCULAR
  Filled 2017-10-04: qty 1

## 2017-10-04 NOTE — ED Triage Notes (Signed)
Pt presents to the ed with complaints of left hip and leg pain x 4 days, denies injury. Dr Ashok Cordia at bedside

## 2017-10-04 NOTE — ED Provider Notes (Signed)
[facility] EMERGENCY DEPARTMENT Provider Note   CSN: [id number] Arrival date & time: 10/04/17  1028     History   Chief Complaint Chief Complaint  Patient presents with  . Leg Pain    HPI Donna Rogers is a 67 y.o. female.  Patient c/o left hip pain as well as radicular pain left low back radiating down left leg towards foot. Pain is constant, dull, mod-severe, worse w certain movements, positional changes. Pt notes prior low back imaging. ?hx ddd. Patient denies recent trauma or fall. Occasionally tingling sensation left leg. No loss of sensation or weakness to leg. No perineal numbness. No urinary retention or incontinence. Denies fever or chills.  Pt is requesting pain shot.    The history is provided by the patient and the spouse.  Leg Pain      Past Medical History:  Diagnosis Date  . Anemia   . Arthritis    rheumatoid  . Depression   . Diabetes mellitus without complication ([place])   . GERD (gastroesophageal reflux disease)   . Headache   . Hyperlipidemia   . Hypertension    pt. denies  . Shortness of breath dyspnea    with exertion    Patient Active Problem List   Diagnosis Date Noted  . Tobacco abuse 04/10/2016  . Lymphocytic colitis 12/03/2015  . DM2 (diabetes mellitus, type 2) ([place]) 10/11/2015  . Depression 06/17/2015  . Bloody stool 06/17/2015  . Fatigue 06/17/2015  . Weakness 06/03/2015  . Hypertension   . OBESITY 01/21/2009  . EROSIVE ESOPHAGITIS 01/21/2009  . GASTROESOPHAGEAL REFLUX DISEASE, CHRONIC 01/21/2009  . ARTHRITIS, RHEUMATOID 01/21/2009  . TENDINITIS 01/21/2009  . EDEMA 01/21/2009    Past Surgical History:  Procedure Laterality Date  . BREAST LUMPECTOMY WITH RADIOACTIVE SEED LOCALIZATION Left 03/03/2016   Procedure: BREAST LUMPECTOMY WITH RADIOACTIVE SEED LOCALIZATION;  Surgeon: Coralie Keens, MD;  Location: [facility];  Service: General;  Laterality: Left;  . COLONOSCOPY  08/2002   RMR: internal hemorrhoids  .  COLONOSCOPY N/A 10/21/2015   Procedure: COLONOSCOPY;  Surgeon: Daneil Dolin, MD;  [facility];  Service: Endoscopy;  Laterality: N/A;  250 - moved to 2:35 - office to notify pt  . CYST REMOVAL TRUNK    . ESOPHAGOGASTRODUODENOSCOPY  08/2002   RMR: nonerosive reflux esophagitis, hiatal hernia  . TONSILLECTOMY      OB History    Gravida Para Term Preterm AB Living   1 1 1     1    SAB TAB Ectopic Multiple Live Births                   Home Medications    Prior to Admission medications   Medication Sig Start Date End Date Taking? Authorizing Provider  amitriptyline (ELAVIL) 50 MG tablet TAKE 1 TABLET (50 MG TOTAL) BY MOUTH AT BEDTIME. 06/09/16   [provider]  aspirin 81 MG tablet Take 81 mg by mouth daily.    [provider]  atenolol (TENORMIN) 25 MG tablet TAKE 1 TABLET BY MOUTH ONCE DAILY 09/23/17   Timmothy Euler, MD  CALCIUM-MAG-VIT C-VIT D PO Take 1 tablet by mouth daily.     [provider]  cholecalciferol (VITAMIN D) 1000 UNITS tablet Take 1,000 Units by mouth daily.    [provider]  citalopram (CELEXA) 40 MG tablet TAKE 1 TABLET ONCE A DAY 09/30/17   Timmothy Euler, MD  Cyclobenzaprine HCl (FLEXERIL PO) Take by  mouth.    [provider]  diclofenac (VOLTAREN) 75 MG EC tablet Take 75 mg by mouth daily.    [provider]  fish oil-omega-3 fatty acids 1000 MG capsule Take 1 g by mouth daily.     [provider]  gabapentin (NEURONTIN) 600 MG tablet Take 2 tablets (1,200 mg total) by mouth 2 (two) times daily. 08/26/17   Timmothy Euler, MD  HYDROcodone-acetaminophen (NORCO/VICODIN) 5-325 MG per tablet Take 1 tablet by mouth every 6 (six) hours as needed. Patient taking differently: Take 1 tablet by mouth every 6 (six) hours as needed for moderate pain.  07/19/14   Lysbeth Penner, FNP  hydrocortisone 2.5 % cream Apply topically 2 (two) times daily. As needed 08/26/17   Timmothy Euler, MD    ibuprofen (ADVIL,MOTRIN) 200 MG tablet Take 800 mg by mouth every 6 (six) hours as needed for moderate pain.    [provider]  LORazepam (ATIVAN) 0.5 MG tablet Take 1 tablet (0.5 mg total) by mouth 2 (two) times daily as needed for anxiety. 02/22/17   Timmothy Euler, MD  omeprazole (PRILOSEC) 20 MG capsule TAKE 1 CAPSULE EVERY DAY 02/22/17   Timmothy Euler, MD  simvastatin (ZOCOR) 40 MG tablet Take 1 tablet (40 mg total) by mouth daily. 02/22/17   Timmothy Euler, MD  sulfaSALAzine (AZULFIDINE) 500 MG tablet Take 1,000 mg by mouth 2 (two) times daily. Reported on 11/28/2015    [provider]  vitamin C (ASCORBIC ACID) 500 MG tablet Take 500 mg by mouth daily.    [provider]    Family History Family History  Problem Relation Age of Onset  . Alzheimer's disease Mother   . Cancer Mother        lung  . Diabetes Father   . Colon cancer Neg Hx     Social History Social History   Tobacco Use  . Smoking status: Current Every Day Smoker    Packs/day: 1.00    Years: 36.00    Pack years: 36.00    Types: Cigarettes  . Smokeless tobacco: Current User  . Tobacco comment: less than a pack daily  Substance Use Topics  . Alcohol use: No    Alcohol/week: 0.0 oz  . Drug use: No     Allergies   Ciprofloxacin hcl   Review of Systems Review of Systems  Constitutional: Negative for fever.  Gastrointestinal: Negative for abdominal pain.  Musculoskeletal: Positive for back pain. Negative for neck pain.  Skin: Negative for rash.  Neurological: Negative for weakness and headaches.  Hematological: Does not bruise/bleed easily.  Psychiatric/Behavioral: Negative for confusion.     Physical Exam Updated Vital Signs BP 135/74 (BP Location: Left Arm)   Pulse 74   Temp 99 F (37.2 C) (Oral)   Resp 18   SpO2 97%   Physical Exam  Constitutional: She appears well-developed and well-nourished. No distress.  HENT:  Head: Atraumatic.  Eyes:  Conjunctivae are normal. No scleral icterus.  Neck: Neck supple. No tracheal deviation present.  Cardiovascular: Normal rate and intact distal pulses.  Pulmonary/Chest: Effort normal. No respiratory distress.  Abdominal: Normal appearance. She exhibits no distension.  Musculoskeletal: She exhibits no edema.  LS spine non tender, aligned, no step off. Good passive rom left hip without pain. Distal pulses palp LLE.   Neurological: She is alert.  Motor intact bil legs, LLE stre 5/5. sens grossly intact.   Skin: Skin is warm and dry. No  rash noted. She is not diaphoretic.  Psychiatric: She has a normal mood and affect.  Nursing note and vitals reviewed.    ED Treatments / Results  Labs (all labs ordered are listed, but only abnormal results are displayed) Labs Reviewed - No data to display  EKG  EKG Interpretation None       Radiology Dg Hip Unilat W Or W/o Pelvis 2-3 Views Left  Result Date: 10/04/2017 CLINICAL DATA:  Left hip and leg pain for 4 days.  No known injury. EXAM: DG HIP (WITH OR WITHOUT PELVIS) 2-3V LEFT COMPARISON:  Lumbar spine radiographs 08/26/2017. FINDINGS: The mineralization and alignment are normal. There is no evidence of acute fracture or dislocation. There is no evidence of femoral head avascular necrosis. The hip joint spaces are maintained. There are mild degenerative changes within the lower lumbar spine and sacroiliac joints. IMPRESSION: No acute osseous findings. Electronically Signed   By: Richardean Sale M.D.   On: 10/04/2017 11:34    Procedures Procedures (including critical care time)  Medications Ordered in ED Medications  HYDROmorphone (DILAUDID) injection 1 mg (1 mg Intramuscular Given 10/04/17 1046)  predniSONE (DELTASONE) tablet 60 mg (60 mg Oral Given 10/04/17 1046)     Initial Impression / Assessment and Plan / ED Course  I have reviewed the triage vital signs and the nursing notes.  Pertinent labs & imaging results that were available  during my care of the patient were reviewed by me and considered in my medical decision making (see chart for details).  Pt requests pain shot. Pt has ride. Does not have to drive.  Dilaudid 1 mg im.  Prednisone po.  Reviewed nursing notes and prior charts for additional history.  Reviewed recent LS films - degenerative changes noted.   todays xrays reviewed - neg for acute process.  Discussed xrays w pt.  Suspect pain is most likely secondary to radicular/sciatic type pain. rx for home.   rec close outpt f/u.  Patient currently appears stable for d/c.     Final Clinical Impressions(s) / ED Diagnoses   Final diagnoses:  None    ED Discharge Orders    None       Lajean Saver, MD 10/04/17 1218

## 2017-10-04 NOTE — Discharge Instructions (Signed)
It was our pleasure to provide your ER care today - we hope that you feel better.  Take prednisone as prescribed.  You may also take percocet as need for pain. No driving for the next 6 hours or when taking percocet. Also, do not take tylenol or acetaminophen containing medication when taking percocet.   Follow up with primary care doctor in the coming week for recheck.  Return to ER if worse, new symptoms, fevers, leg numbness/weakness, other concern.

## 2017-10-18 DIAGNOSIS — M9904 Segmental and somatic dysfunction of sacral region: Secondary | ICD-10-CM | POA: Diagnosis not present

## 2017-10-18 DIAGNOSIS — M5432 Sciatica, left side: Secondary | ICD-10-CM | POA: Diagnosis not present

## 2017-10-18 DIAGNOSIS — M9903 Segmental and somatic dysfunction of lumbar region: Secondary | ICD-10-CM | POA: Diagnosis not present

## 2017-10-18 DIAGNOSIS — M9905 Segmental and somatic dysfunction of pelvic region: Secondary | ICD-10-CM | POA: Diagnosis not present

## 2017-10-20 DIAGNOSIS — M9904 Segmental and somatic dysfunction of sacral region: Secondary | ICD-10-CM | POA: Diagnosis not present

## 2017-10-20 DIAGNOSIS — M9903 Segmental and somatic dysfunction of lumbar region: Secondary | ICD-10-CM | POA: Diagnosis not present

## 2017-10-20 DIAGNOSIS — M5432 Sciatica, left side: Secondary | ICD-10-CM | POA: Diagnosis not present

## 2017-10-20 DIAGNOSIS — M9905 Segmental and somatic dysfunction of pelvic region: Secondary | ICD-10-CM | POA: Diagnosis not present

## 2017-10-26 DIAGNOSIS — M9904 Segmental and somatic dysfunction of sacral region: Secondary | ICD-10-CM | POA: Diagnosis not present

## 2017-10-26 DIAGNOSIS — M5432 Sciatica, left side: Secondary | ICD-10-CM | POA: Diagnosis not present

## 2017-10-26 DIAGNOSIS — M9905 Segmental and somatic dysfunction of pelvic region: Secondary | ICD-10-CM | POA: Diagnosis not present

## 2017-10-26 DIAGNOSIS — M9903 Segmental and somatic dysfunction of lumbar region: Secondary | ICD-10-CM | POA: Diagnosis not present

## 2017-11-02 DIAGNOSIS — M9904 Segmental and somatic dysfunction of sacral region: Secondary | ICD-10-CM | POA: Diagnosis not present

## 2017-11-02 DIAGNOSIS — M9903 Segmental and somatic dysfunction of lumbar region: Secondary | ICD-10-CM | POA: Diagnosis not present

## 2017-11-02 DIAGNOSIS — M9905 Segmental and somatic dysfunction of pelvic region: Secondary | ICD-10-CM | POA: Diagnosis not present

## 2017-11-02 DIAGNOSIS — M5432 Sciatica, left side: Secondary | ICD-10-CM | POA: Diagnosis not present

## 2017-11-16 ENCOUNTER — Ambulatory Visit (INDEPENDENT_AMBULATORY_CARE_PROVIDER_SITE_OTHER): Payer: Medicare Other | Admitting: Internal Medicine

## 2017-11-16 ENCOUNTER — Encounter: Payer: Self-pay | Admitting: Internal Medicine

## 2017-11-16 VITALS — BP 154/88 | HR 76 | Ht 62.0 in | Wt 197.8 lb

## 2017-11-16 DIAGNOSIS — M0579 Rheumatoid arthritis with rheumatoid factor of multiple sites without organ or systems involvement: Secondary | ICD-10-CM

## 2017-11-16 DIAGNOSIS — K529 Noninfective gastroenteritis and colitis, unspecified: Secondary | ICD-10-CM

## 2017-11-16 DIAGNOSIS — R152 Fecal urgency: Secondary | ICD-10-CM

## 2017-11-16 DIAGNOSIS — R159 Full incontinence of feces: Secondary | ICD-10-CM

## 2017-11-16 DIAGNOSIS — K52832 Lymphocytic colitis: Secondary | ICD-10-CM | POA: Diagnosis not present

## 2017-11-16 MED ORDER — BUDESONIDE 3 MG PO CPEP: 9.0000 mg | ORAL_CAPSULE | Freq: Every day | ORAL | 2 refills | Status: DC

## 2017-11-16 MED ORDER — DIPHENOXYLATE-ATROPINE 2.5-0.025 MG PO TABS: ORAL_TABLET | ORAL | 1 refills | Status: DC

## 2017-11-16 NOTE — Progress Notes (Signed)
Donna Rogers 67 y.o. 1951/03/30 [id number]  Assessment & Plan:   Encounter Diagnoses  Name Primary?  . Lymphocytic colitis Yes  . Chronic diarrhea   . Incontinence of feces with fecal urgency   . Rheumatoid arthritis of multiple sites without organ or system involvement with positive rheumatoid factor (HCC)     Lymphocytic colitis This is causing severe watery diarrhea and incontinence of feces with fecal urgency.  Will treat with budesonide.  She now has a Medicare plan with prescription coverage so hopefully this will be affordable.  If it does not probably use [place] versus a compounded medication. Lomotil 1-2 4 times daily for symptom control Follow-up with me in 2 months. Extensive discussion to explain the nature of this illness and how diclofenac might be a cause and that she should try to get off of that at some point.  An additional medication that could be affecting things as her Celexa as SSRI can be a trigger for this. I do not see why she cannot take Enbrel.  I do think it would be useful to consider a different biologic perhaps which could have some effect on her lymphocytic colitis, though its off label I have seen success with Humira for treatment of refractory microscopic colitis.  We will copy her rheumatologist to consider this as time goes forward. As far as an NSAID Celebrex would be the best though often not as effective and cost can be an issue. I described Kegel exercises to her and asked her to try those.  Depending upon how she does pelvic floor physical therapy could be needed though I think if we can get her diarrhea under control she will be all right with respect to the incontinence. Bear in mind that sometimes IBS is the underlying problem and we could need to change treatment.  However despite her memory she did actually respond to prednisone so I am hopeful that we can get this into remission.   JK:DTOIZTIW, Sherley Bounds, MD Dr.  Amil Amen  Subjective:   Chief Complaint: Lymphocytic colitis with diarrhea urgency and fecal incontinence  HPI The patient is a 67 year old white woman here with her daughter-in-law for a second opinion about lymphocytic colitis and request for treatment.  She was diagnosed with lymphocytic colitis by Dr. Gala Romney in March 2017, colonoscopy report showing internal hemorrhoids and minimally abnormal appearing rectum is status post segmental biopsy changes, and pathology demonstrated a rectal hyperplastic polyp, and lymphocytic colitis in the right and left colon.  As best I can tell it was attempted to treat with Entocort but it was too expensive, prednisone was used and the patient says she was not helped but the records show her diarrhea did improve, but she and her daughter-in-law relate that it made her gain weight.  She was uninsured at the time of that diagnosis and was initially treated with 20 mg of prednisone tapering down.  Subsequently she was tried on mesalamine, and also on Pepto-Bismol with no help.  She has never used Lomotil.  She currently takes sulfasalazine for rheumatoid arthritis and has held her Enbrel because she is afraid to take that due to the lymphocytic colitis that she thinks is an infection.  Current symptoms are 5 or more watery stools a day, sometimes more than 10 in a 24-hour.  With fecal urgency and incontinence as well as nocturnal stools and incontinence.  She wears depends.  She says "I have no life".  She denies any major birth trauma  she had one son.  She was frustrated and asked for another opinion.  She is on diclofenac regularly and has been advised to try to stop that.  At this point she seems to have limited understanding of the nature of her illness which I reviewed with her today.  She sees Dr. Amil Amen of rheumatology for her rheumatoid arthritis.  There is no bleeding.  She has no urinary incontinence.  She has bloating at times she complains of some  heartburn. Allergies  Allergen Reactions  . Ciprofloxacin Hcl Rash   Current Meds  Medication Sig  . aspirin 81 MG tablet Take 81 mg by mouth daily.  Marland Kitchen atenolol (TENORMIN) 25 MG tablet TAKE 1 TABLET BY MOUTH ONCE DAILY  . cholecalciferol (VITAMIN D) 1000 UNITS tablet Take 1,000 Units by mouth daily.  . citalopram (CELEXA) 40 MG tablet TAKE 1 TABLET ONCE A DAY  . Cyclobenzaprine HCl (FLEXERIL PO) Take by mouth.  . diclofenac (VOLTAREN) 75 MG EC tablet Take 75 mg by mouth daily.  Marland Kitchen gabapentin (NEURONTIN) 600 MG tablet Take 2 tablets (1,200 mg total) by mouth 2 (two) times daily.  Marland Kitchen HYDROcodone-acetaminophen (NORCO/VICODIN) 5-325 MG per tablet Take 1 tablet by mouth every 6 (six) hours as needed. (Patient taking differently: Take 1 tablet by mouth every 6 (six) hours as needed for moderate pain. )  . hydrocortisone 2.5 % cream Apply topically 2 (two) times daily. As needed  . ibuprofen (ADVIL,MOTRIN) 200 MG tablet Take 800 mg by mouth every 6 (six) hours as needed for moderate pain.  Marland Kitchen LORazepam (ATIVAN) 0.5 MG tablet Take 1 tablet (0.5 mg total) by mouth 2 (two) times daily as needed for anxiety.  Marland Kitchen omeprazole (PRILOSEC) 20 MG capsule TAKE 1 CAPSULE EVERY DAY  . simvastatin (ZOCOR) 40 MG tablet Take 1 tablet (40 mg total) by mouth daily.  Marland Kitchen sulfaSALAzine (AZULFIDINE) 500 MG tablet Take 1,000 mg by mouth 2 (two) times daily. Reported on 11/28/2015  . vitamin C (ASCORBIC ACID) 500 MG tablet Take 500 mg by mouth daily.   Past Medical History:  Diagnosis Date  . Anemia   . Arthritis    rheumatoid  . Depression   . Diabetes mellitus without complication ([place])   . GERD (gastroesophageal reflux disease)   . Headache   . Hyperlipidemia   . Hyperplastic rectal polyp 10/21/2015  . Hypertension    pt. denies  . Lymphocytic colitis   . Shortness of breath dyspnea    with exertion   Past Surgical History:  Procedure Laterality Date  . BREAST LUMPECTOMY WITH RADIOACTIVE SEED LOCALIZATION  Left 03/03/2016   Procedure: BREAST LUMPECTOMY WITH RADIOACTIVE SEED LOCALIZATION;  Surgeon: Coralie Keens, MD;  Location: [facility];  Service: General;  Laterality: Left;  . COLONOSCOPY  08/2002   RMR: internal hemorrhoids  . COLONOSCOPY N/A 10/21/2015   Procedure: COLONOSCOPY;  Surgeon: Daneil Dolin, MD;  [facility];  Service: Endoscopy;  Laterality: N/A;  250 - moved to 2:35 - office to notify pt  . CYST REMOVAL TRUNK    . ESOPHAGOGASTRODUODENOSCOPY  08/2002   RMR: nonerosive reflux esophagitis, hiatal hernia  . TONSILLECTOMY     Social History   Social History Narrative   The patient is divorced she has 1 sonWho is married that she has a grandchild.   She is not employed though the chart indicates she has cared for her parents   She does not use alcohol or drugs she drinks 3-4 caffeinated beverages  daily   She is a smoker   family history includes Alzheimer's disease in her mother; Diabetes in her father; Lung cancer in her mother.   Review of Systems As per HPI.  She has sciatica on the right.  Pedal edema insomnia some night sweats some skin itching rheumatoid arthritis symptoms.  All other review of systems are negative at this time.  Objective:   Physical Exam @BP  (!) 154/88   Pulse 76   Ht 5\' 2"  (1.575 m)   Wt 197 lb 12.8 oz (89.7 kg)   BMI 36.18 kg/m @  General:  Well-developed, well-nourished and in no acute distress he is an obese elderly woman Eyes:  anicteric. Neck:   supple w/o thyromegaly or mass.  Lungs: Fair to good air movement with scattered wheezes expiratory. Heart:   S1S2, no rubs, murmurs, gallops.  Heart sounds are distant Abdomen: Obese soft, non-tender, no hepatosplenomegaly, hernia, or mass and BS+.  Rectal: Performed with Patti Martinique [name] present  Some small fleshy anal tags slightly decreased resting tone without mass or rectocele or tenderness slightly decreased voluntary squeeze normal simulated defecation with appropriate abdominal  contraction and descent and relaxation of the anal sphincter  Absent anal wink Lymph:  no cervical or supraclavicular adenopathy. Extremities:   no edema, cyanosis or clubbing Skin   no rash. Neuro:  A&O x 3.  Psych:  appropriate mood and  Affect.   Data Reviewed: Extensive data review of outside gastroenterology notes from 2017 labs in the EMR, procedure report and a colonoscopy, pathology reports.  TSH normal August 26, 2017 CBC was normal as well though glucose was 140 which may rise some with the budesonide HDL 27 lipids otherwise normal hemoglobin A1c was 5.9

## 2017-11-16 NOTE — Assessment & Plan Note (Addendum)
This is causing severe watery diarrhea and incontinence of feces with fecal urgency.  Will treat with budesonide.  She now has a Medicare plan with prescription coverage so hopefully this will be affordable.  If it does not probably use [place] versus a compounded medication. Lomotil 1-2 4 times daily for symptom control Follow-up with me in 2 months. Extensive discussion to explain the nature of this illness and how diclofenac might be a cause and that she should try to get off of that at some point.  An additional medication that could be affecting things as her Celexa as SSRI can be a trigger for this. I do not see why she cannot take Enbrel.  I do think it would be useful to consider a different biologic perhaps which could have some effect on her lymphocytic colitis, though its off label I have seen success with Humira for treatment of refractory microscopic colitis.  We will copy her rheumatologist to consider this as time goes forward. As far as an NSAID Celebrex would be the best though often not as effective and cost can be an issue. I described Kegel exercises to her and asked her to try those.  Depending upon how she does pelvic floor physical therapy could be needed though I think if we can get her diarrhea under control she will be all right with respect to the incontinence. Bear in mind that sometimes IBS is the underlying problem and we could need to change treatment.  However despite her memory she did actually respond to prednisone so I am hopeful that we can get this into remission.

## 2017-11-16 NOTE — Patient Instructions (Signed)
We have sent the following medications to your pharmacy for you to pick up at your convenience: Lomotil, Entocort   Follow up with Dr Carlean Purl in June.   We are giving you information to read on microscopic colitis and also information on [facility].   I appreciate the opportunity to care for you. Silvano Rusk, MD, [facility]

## 2017-11-21 DIAGNOSIS — R05 Cough: Secondary | ICD-10-CM | POA: Diagnosis not present

## 2017-11-21 DIAGNOSIS — R0602 Shortness of breath: Secondary | ICD-10-CM | POA: Diagnosis not present

## 2017-11-21 DIAGNOSIS — J101 Influenza due to other identified influenza virus with other respiratory manifestations: Secondary | ICD-10-CM | POA: Diagnosis not present

## 2017-11-21 DIAGNOSIS — J1089 Influenza due to other identified influenza virus with other manifestations: Secondary | ICD-10-CM | POA: Diagnosis not present

## 2017-11-21 DIAGNOSIS — Z720 Tobacco use: Secondary | ICD-10-CM | POA: Diagnosis not present

## 2017-11-21 DIAGNOSIS — J9801 Acute bronchospasm: Secondary | ICD-10-CM | POA: Diagnosis not present

## 2017-11-21 DIAGNOSIS — F17200 Nicotine dependence, unspecified, uncomplicated: Secondary | ICD-10-CM | POA: Diagnosis not present

## 2017-11-22 ENCOUNTER — Ambulatory Visit (INDEPENDENT_AMBULATORY_CARE_PROVIDER_SITE_OTHER): Payer: Medicare Other | Admitting: Family Medicine

## 2017-11-22 ENCOUNTER — Encounter: Payer: Self-pay | Admitting: Family Medicine

## 2017-11-22 VITALS — BP 115/65 | HR 69 | Temp 99.4°F | Ht 62.0 in | Wt 193.2 lb

## 2017-11-22 DIAGNOSIS — J101 Influenza due to other identified influenza virus with other respiratory manifestations: Secondary | ICD-10-CM

## 2017-11-22 MED ORDER — AZITHROMYCIN 250 MG PO TABS: ORAL_TABLET | ORAL | 0 refills | Status: DC

## 2017-11-22 MED ORDER — ALBUTEROL SULFATE (2.5 MG/3ML) 0.083% IN NEBU: 2.5000 mg | INHALATION_SOLUTION | Freq: Four times a day (QID) | RESPIRATORY_TRACT | 1 refills | Status: DC | PRN

## 2017-11-22 NOTE — Progress Notes (Signed)
   HPI  Patient presents today here for acute illness.  Patient was diagnosed with influenza at urgent care 1 day ago. She was started on Tamiflu.  Patient states she is on day 5 of symptoms. She reports chills, cough, and congestion.  She states that her shortness of breath has been persistent throughout the illness. She comes in today with concern of worsening shortness of breath. She states her oxygen at home was measured at 90%.  She would like to try an albuterol inhaler, she does not want to take prednisone  She is tolerating food and fluids like usual, however she admits that she has not got enough fluids.  PMH: Smoking status noted ROS: Per HPI  Objective: BP 115/65   Pulse 69   Temp 99.4 F (37.4 C) (Oral)   Ht 5\' 2"  (1.575 m)   Wt 193 lb 3.2 oz (87.6 kg)   SpO2 91%   BMI 35.34 kg/m  Gen: NAD, alert, cooperative with exam HEENT: NCAT, moist oral mucosa CV: RRR, good S1/S2, no murmur Resp: Nonlabored, decreased air movement, rales bilateral bases Ext: No edema, warm Neuro: Alert and oriented, No gross deficits  Assessment and plan:  #Influenza Patient with influenza, started on Tamiflu 1 day ago. Discussed supportive care With rales and shortness of breath I have added azithromycin to cover for possible underlying pneumonia   Meds ordered this encounter  Medications  . albuterol (PROVENTIL) (2.5 MG/3ML) 0.083% nebulizer solution    Sig: Take 3 mLs (2.5 mg total) by nebulization every 6 (six) hours as needed for wheezing or shortness of breath.    Dispense:  150 mL    Refill:  1  . azithromycin (ZITHROMAX) 250 MG tablet    Sig: Take 2 tablets on day 1 and 1 tablet daily after that    Dispense:  6 tablet    Refill:  0    Laroy Apple, MD Tristan Schroeder [facility] 11/22/2017, 4:10 PM

## 2017-11-25 ENCOUNTER — Ambulatory Visit: Payer: Medicare Other | Admitting: Family Medicine

## 2017-12-20 DIAGNOSIS — M8580 Other specified disorders of bone density and structure, unspecified site: Secondary | ICD-10-CM | POA: Diagnosis not present

## 2017-12-20 DIAGNOSIS — R197 Diarrhea, unspecified: Secondary | ICD-10-CM | POA: Diagnosis not present

## 2017-12-20 DIAGNOSIS — M0579 Rheumatoid arthritis with rheumatoid factor of multiple sites without organ or systems involvement: Secondary | ICD-10-CM | POA: Diagnosis not present

## 2017-12-20 DIAGNOSIS — M5136 Other intervertebral disc degeneration, lumbar region: Secondary | ICD-10-CM | POA: Diagnosis not present

## 2017-12-20 DIAGNOSIS — E669 Obesity, unspecified: Secondary | ICD-10-CM | POA: Diagnosis not present

## 2017-12-20 DIAGNOSIS — Z6834 Body mass index (BMI) 34.0-34.9, adult: Secondary | ICD-10-CM | POA: Diagnosis not present

## 2017-12-20 DIAGNOSIS — M150 Primary generalized (osteo)arthritis: Secondary | ICD-10-CM | POA: Diagnosis not present

## 2017-12-27 ENCOUNTER — Other Ambulatory Visit: Payer: Self-pay | Admitting: Family Medicine

## 2018-01-18 ENCOUNTER — Encounter (INDEPENDENT_AMBULATORY_CARE_PROVIDER_SITE_OTHER): Payer: Self-pay

## 2018-01-18 ENCOUNTER — Ambulatory Visit (INDEPENDENT_AMBULATORY_CARE_PROVIDER_SITE_OTHER): Payer: Medicare Other | Admitting: Internal Medicine

## 2018-01-18 ENCOUNTER — Encounter: Payer: Self-pay | Admitting: Internal Medicine

## 2018-01-18 DIAGNOSIS — K52832 Lymphocytic colitis: Secondary | ICD-10-CM | POA: Diagnosis not present

## 2018-01-18 MED ORDER — DIPHENOXYLATE-ATROPINE 2.5-0.025 MG PO TABS: ORAL_TABLET | ORAL | 1 refills | Status: DC

## 2018-01-18 MED ORDER — BUDESONIDE 3 MG PO CPEP: 9.0000 mg | ORAL_CAPSULE | Freq: Every day | ORAL | 3 refills | Status: DC

## 2018-01-18 NOTE — Progress Notes (Signed)
Donna Rogers 67 y.o. 1951/05/07 [id number]  Assessment & Plan:   Lymphocytic colitis Doing very well on budesonide 9 mg daily, still taking Lomotil twice daily.  She is to stop the Lomotil and if she does well after her beach trip try to reduce to 6 mg budesonide daily.  She will follow-up with me in August.  Call back sooner as needed we will try to get her to the lowest effective dose. She says she is not taking ibuprofen on her med list, Celexa is something that is associated with this but I am not inclined to make any other medication changes at this time we can review that in the future.  Diclofenac is also a possible contributor  Consider celiac serology upon return   I appreciate the opportunity to care for this patient. CC: Timmothy Euler, MD   Subjective:   Chief Complaint: Follow-up of lymphocytic colitis  HPI The patient is here with family members and reports that she is doing very well with 1 or 2 formed stools a day and she can get out of the house.  It is a major improvement in her life and she is thrilled.  She is still taking Lomotil twice daily and budesonide 9 mg a day.  That cost $150 a month but she is getting some help and is making it. Allergies  Allergen Reactions  . Ciprofloxacin Hcl Rash   Current Meds  Medication Sig  . aspirin 81 MG tablet Take 81 mg by mouth daily.  Marland Kitchen atenolol (TENORMIN) 25 MG tablet TAKE 1 TABLET BY MOUTH ONCE DAILY  . budesonide (ENTOCORT EC) 3 MG 24 hr capsule Take 3 capsules (9 mg total) by mouth daily.  . cholecalciferol (VITAMIN D) 1000 UNITS tablet Take 1,000 Units by mouth daily.  . citalopram (CELEXA) 40 MG tablet TAKE 1 TABLET ONCE A DAY  . Cyclobenzaprine HCl (FLEXERIL PO) Take by mouth as needed.   . diclofenac (VOLTAREN) 75 MG EC tablet Take 75 mg by mouth daily.  . diphenoxylate-atropine (LOMOTIL) 2.5-0.025 MG tablet 1-2 tablets ac/hs prn  . gabapentin (NEURONTIN) 600 MG tablet Take 2 tablets (1,200 mg  total) by mouth 2 (two) times daily.  Marland Kitchen HYDROcodone-acetaminophen (NORCO/VICODIN) 5-325 MG per tablet Take 1 tablet by mouth every 6 (six) hours as needed. (Patient taking differently: Take 1 tablet by mouth every 6 (six) hours as needed for moderate pain. )  . hydrocortisone 2.5 % cream Apply topically 2 (two) times daily. As needed  . ibuprofen (ADVIL,MOTRIN) 200 MG tablet Take 800 mg by mouth every 6 (six) hours as needed for moderate pain.  Marland Kitchen LORazepam (ATIVAN) 0.5 MG tablet Take 1 tablet (0.5 mg total) by mouth 2 (two) times daily as needed for anxiety.  Marland Kitchen omeprazole (PRILOSEC) 20 MG capsule TAKE 1 CAPSULE EVERY DAY  . simvastatin (ZOCOR) 40 MG tablet Take 1 tablet (40 mg total) by mouth daily.  Marland Kitchen sulfaSALAzine (AZULFIDINE) 500 MG tablet Take 1,000 mg by mouth 2 (two) times daily. Reported on 11/28/2015  . vitamin C (ASCORBIC ACID) 500 MG tablet Take 500 mg by mouth daily.  .    .     Past Medical History:  Diagnosis Date  . Anemia   . Arthritis    rheumatoid  . Depression   . Diabetes mellitus without complication ([place])   . GERD (gastroesophageal reflux disease)   . Headache   . Hyperlipidemia   . Hyperplastic rectal polyp 10/21/2015  . Hypertension  pt. denies  . Lymphocytic colitis   . Shortness of breath dyspnea    with exertion   Past Surgical History:  Procedure Laterality Date  . BREAST LUMPECTOMY WITH RADIOACTIVE SEED LOCALIZATION Left 03/03/2016   Procedure: BREAST LUMPECTOMY WITH RADIOACTIVE SEED LOCALIZATION;  Surgeon: Coralie Keens, MD;  Location: [facility];  Service: General;  Laterality: Left;  . COLONOSCOPY  08/2002   RMR: internal hemorrhoids  . COLONOSCOPY N/A 10/21/2015   Procedure: COLONOSCOPY;  Surgeon: Daneil Dolin, MD;  [facility];  Service: Endoscopy;  Laterality: N/A;  250 - moved to 2:35 - office to notify pt  . CYST REMOVAL TRUNK    . ESOPHAGOGASTRODUODENOSCOPY  08/2002   RMR: nonerosive reflux esophagitis, hiatal hernia  .  TONSILLECTOMY     Review of Systems As above  Objective:   Physical Exam BP 138/78   Pulse 82   Ht 5' (1.524 m) Comment: without shoes  Wt 193 lb (87.5 kg)   BMI 37.69 kg/m  No acute distress

## 2018-01-18 NOTE — Assessment & Plan Note (Addendum)
Doing very well on budesonide 9 mg daily, still taking Lomotil twice daily.  She is to stop the Lomotil and if she does well after her beach trip try to reduce to 6 mg budesonide daily.  She will follow-up with me in August.  Call back sooner as needed we will try to get her to the lowest effective dose. She says she is not taking ibuprofen on her med list, Celexa is something that is associated with this but I am not inclined to make any other medication changes at this time we can review that in the future.  Diclofenac is also a possible contributor  Consider celiac serology upon return

## 2018-01-18 NOTE — Patient Instructions (Addendum)
   Glad to hear you are better.  1) Try to stop taking the diphenoxylate and atropine.  2) If this works - after [employer] trip try to take only 2 capsules of budesonide (Entocort) daily  3) Let me know if any questions come up  Have a great beach trip! See you in August.   You have been given a separate informational sheet regarding your tobacco use, the importance of quitting and local resources to help you quit.  I appreciate the opportunity to care for you. Gatha Mayer, MD, [facility]

## 2018-03-04 ENCOUNTER — Other Ambulatory Visit: Payer: Self-pay | Admitting: Family Medicine

## 2018-03-06 ENCOUNTER — Other Ambulatory Visit: Payer: Self-pay | Admitting: Family Medicine

## 2018-03-06 DIAGNOSIS — G629 Polyneuropathy, unspecified: Secondary | ICD-10-CM

## 2018-03-28 ENCOUNTER — Other Ambulatory Visit: Payer: Self-pay | Admitting: Family Medicine

## 2018-03-28 ENCOUNTER — Ambulatory Visit: Payer: Medicare Other | Admitting: Internal Medicine

## 2018-03-28 DIAGNOSIS — G629 Polyneuropathy, unspecified: Secondary | ICD-10-CM

## 2018-04-15 ENCOUNTER — Other Ambulatory Visit: Payer: Self-pay | Admitting: *Deleted

## 2018-04-15 DIAGNOSIS — K2100 Gastro-esophageal reflux disease with esophagitis, without bleeding: Secondary | ICD-10-CM

## 2018-04-15 MED ORDER — OMEPRAZOLE 20 MG PO CPDR: DELAYED_RELEASE_CAPSULE | ORAL | 0 refills | Status: DC

## 2018-04-15 MED ORDER — SIMVASTATIN 40 MG PO TABS: 40.0000 mg | ORAL_TABLET | Freq: Every day | ORAL | 0 refills | Status: DC

## 2018-04-15 NOTE — Addendum Note (Signed)
Addended by: Antonietta Barcelona D on: 04/15/2018 11:19 AM   Modules accepted: Orders

## 2018-04-22 DIAGNOSIS — M5136 Other intervertebral disc degeneration, lumbar region: Secondary | ICD-10-CM | POA: Diagnosis not present

## 2018-04-22 DIAGNOSIS — M0579 Rheumatoid arthritis with rheumatoid factor of multiple sites without organ or systems involvement: Secondary | ICD-10-CM | POA: Diagnosis not present

## 2018-04-22 DIAGNOSIS — Z6836 Body mass index (BMI) 36.0-36.9, adult: Secondary | ICD-10-CM | POA: Diagnosis not present

## 2018-04-22 DIAGNOSIS — R5382 Chronic fatigue, unspecified: Secondary | ICD-10-CM | POA: Diagnosis not present

## 2018-04-22 DIAGNOSIS — M150 Primary generalized (osteo)arthritis: Secondary | ICD-10-CM | POA: Diagnosis not present

## 2018-04-22 DIAGNOSIS — E669 Obesity, unspecified: Secondary | ICD-10-CM | POA: Diagnosis not present

## 2018-04-22 DIAGNOSIS — R197 Diarrhea, unspecified: Secondary | ICD-10-CM | POA: Diagnosis not present

## 2018-04-22 DIAGNOSIS — M8580 Other specified disorders of bone density and structure, unspecified site: Secondary | ICD-10-CM | POA: Diagnosis not present

## 2018-04-27 ENCOUNTER — Other Ambulatory Visit: Payer: Self-pay | Admitting: *Deleted

## 2018-04-27 MED ORDER — CITALOPRAM HYDROBROMIDE 40 MG PO TABS: 40.0000 mg | ORAL_TABLET | Freq: Every day | ORAL | 0 refills | Status: DC

## 2018-05-08 ENCOUNTER — Other Ambulatory Visit: Payer: Self-pay | Admitting: Family Medicine

## 2018-05-08 DIAGNOSIS — G629 Polyneuropathy, unspecified: Secondary | ICD-10-CM

## 2018-05-09 ENCOUNTER — Other Ambulatory Visit: Payer: Self-pay | Admitting: Family Medicine

## 2018-05-09 DIAGNOSIS — K2100 Gastro-esophageal reflux disease with esophagitis, without bleeding: Secondary | ICD-10-CM

## 2018-05-09 NOTE — Telephone Encounter (Signed)
Ov 05/16/18

## 2018-05-12 ENCOUNTER — Telehealth: Payer: Self-pay | Admitting: *Deleted

## 2018-05-12 DIAGNOSIS — G629 Polyneuropathy, unspecified: Secondary | ICD-10-CM

## 2018-05-12 MED ORDER — GABAPENTIN 600 MG PO TABS: ORAL_TABLET | ORAL | 0 refills | Status: DC

## 2018-05-12 NOTE — Telephone Encounter (Signed)
LMOVM refill sent to [facility] 

## 2018-05-16 ENCOUNTER — Encounter: Payer: Self-pay | Admitting: Family Medicine

## 2018-05-16 ENCOUNTER — Ambulatory Visit (INDEPENDENT_AMBULATORY_CARE_PROVIDER_SITE_OTHER): Payer: Medicare Other | Admitting: Family Medicine

## 2018-05-16 ENCOUNTER — Ambulatory Visit (INDEPENDENT_AMBULATORY_CARE_PROVIDER_SITE_OTHER): Payer: Medicare Other

## 2018-05-16 VITALS — BP 117/72 | HR 79 | Temp 97.6°F | Ht 60.0 in | Wt 202.0 lb

## 2018-05-16 DIAGNOSIS — R0609 Other forms of dyspnea: Secondary | ICD-10-CM

## 2018-05-16 DIAGNOSIS — E1159 Type 2 diabetes mellitus with other circulatory complications: Secondary | ICD-10-CM | POA: Diagnosis not present

## 2018-05-16 DIAGNOSIS — R05 Cough: Secondary | ICD-10-CM | POA: Diagnosis not present

## 2018-05-16 DIAGNOSIS — I10 Essential (primary) hypertension: Secondary | ICD-10-CM

## 2018-05-16 DIAGNOSIS — K2100 Gastro-esophageal reflux disease with esophagitis, without bleeding: Secondary | ICD-10-CM

## 2018-05-16 DIAGNOSIS — E1142 Type 2 diabetes mellitus with diabetic polyneuropathy: Secondary | ICD-10-CM | POA: Diagnosis not present

## 2018-05-16 DIAGNOSIS — B351 Tinea unguium: Secondary | ICD-10-CM

## 2018-05-16 DIAGNOSIS — G629 Polyneuropathy, unspecified: Secondary | ICD-10-CM | POA: Diagnosis not present

## 2018-05-16 DIAGNOSIS — Z23 Encounter for immunization: Secondary | ICD-10-CM | POA: Diagnosis not present

## 2018-05-16 DIAGNOSIS — I152 Hypertension secondary to endocrine disorders: Secondary | ICD-10-CM

## 2018-05-16 DIAGNOSIS — R053 Chronic cough: Secondary | ICD-10-CM

## 2018-05-16 LAB — BAYER DCA HB A1C WAIVED: HB A1C (BAYER DCA - WAIVED): 6.9 % (ref <7.0)

## 2018-05-16 MED ORDER — BENZONATATE 100 MG PO CAPS: 100.0000 mg | ORAL_CAPSULE | Freq: Three times a day (TID) | ORAL | 0 refills | Status: DC | PRN

## 2018-05-16 MED ORDER — OMEPRAZOLE 20 MG PO CPDR: 20.0000 mg | DELAYED_RELEASE_CAPSULE | Freq: Every day | ORAL | 0 refills | Status: DC

## 2018-05-16 MED ORDER — ATENOLOL 25 MG PO TABS: 25.0000 mg | ORAL_TABLET | Freq: Every day | ORAL | 0 refills | Status: DC

## 2018-05-16 MED ORDER — SIMVASTATIN 40 MG PO TABS: 40.0000 mg | ORAL_TABLET | Freq: Every day | ORAL | 0 refills | Status: DC

## 2018-05-16 MED ORDER — GABAPENTIN 600 MG PO TABS: ORAL_TABLET | ORAL | 2 refills | Status: DC

## 2018-05-16 MED ORDER — CITALOPRAM HYDROBROMIDE 40 MG PO TABS: 40.0000 mg | ORAL_TABLET | Freq: Every day | ORAL | 3 refills | Status: DC

## 2018-05-16 NOTE — Progress Notes (Signed)
Subjective: CC: DM PCP: [name], DO QDI:[id number] Donna Rogers is Donna 67 y.o. female presenting to clinic today for:  1. Type 2 Diabetes:  Patient reports she has totally been off of medications for about 5 months now.  She notes that she was having lows into the 50s and was instructed by her previous PCP to discontinue medication.  She is also previously been on metformin but it was intolerant to this medication.  She think she was on glipizide last time and again it was discontinued several months ago.  She denies any blood sugars greater than 200.  She has had no recurrent lows.  She does report chronic bilateral lower extremity numbness and tingling with the right foot being more predominant than the left.  She has previously been seen podiatry for foot pain but has not seen them for her toenails, which she notes are long and thick.  She has not had Donna eye exam in quite some time.  Last eye exam: >1 year Last foot exam: due Last A1c: 08/2017 was 5.9. Nephropathy screen indicated?: yes Last flu, zoster and/or pneumovax: Needs PNA and Flu  ROS: denies fever, chills, dizziness, LOC, polyuria, polydipsia, unintended weight loss/gain, foot ulcerations, chest pain.  Reports numbness or tingling in extremities.  2. HTN/ cough Reports compliance with atenolol.  No chest pain.  She does report dyspnea on exertion that has been ongoing since she was infected with influenza last spring.  She reports one episode of hemoptysis.  She states constant coughing.  No fevers, chills.  She has had gagging/ SOB related to coughing spells. No use ACE-I.  She is on gabapentin for neuropathy and PPI for acid reflux, which she notes is under good control.  She is Donna daily smoker.  Denies any postnasal drip or allergy symptoms.  ROS: Per HPI  Allergies  Allergen Reactions  . Ciprofloxacin Hcl Rash   Past Medical History:  Diagnosis Date  . Anemia   . Arthritis    rheumatoid  . Depression   . Diabetes  mellitus without complication ([place])   . GERD (gastroesophageal reflux disease)   . Headache   . Hyperlipidemia   . Hyperplastic rectal polyp 10/21/2015  . Hypertension    pt. denies  . Lymphocytic colitis   . Shortness of breath dyspnea    with exertion    Current Outpatient Medications:  .  aspirin 81 MG tablet, Take 81 mg by mouth daily., Disp: , Rfl:  .  atenolol (TENORMIN) 25 MG tablet, TAKE 1 TABLET BY MOUTH EVERY DAY, Disp: 90 tablet, Rfl: 0 .  cholecalciferol (VITAMIN D) 1000 UNITS tablet, Take 1,000 Units by mouth daily., Disp: , Rfl:  .  citalopram (CELEXA) 40 MG tablet, Take 1 tablet (40 mg total) by mouth daily. (Needs to be seen), Disp: 30 tablet, Rfl: 0 .  diclofenac (VOLTAREN) 75 MG EC tablet, Take 75 mg by mouth daily., Disp: , Rfl:  .  diphenoxylate-atropine (LOMOTIL) 2.5-0.025 MG tablet, 1-2 tablets ac/hs prn, Disp: 180 tablet, Rfl: 1 .  gabapentin (NEURONTIN) 600 MG tablet, TAKE 2 TABLETS BY MOUTH TWICE DAILY, Disp: 120 tablet, Rfl: 0 .  HYDROcodone-acetaminophen (NORCO/VICODIN) 5-325 MG per tablet, Take 1 tablet by mouth every 6 (six) hours as needed. (Patient taking differently: Take 1 tablet by mouth every 6 (six) hours as needed for moderate pain. ), Disp: 60 tablet, Rfl: 0 .  ibuprofen (ADVIL,MOTRIN) 200 MG tablet, Take 800 mg by mouth every 6 (six) hours  as needed for moderate pain., Disp: , Rfl:  .  omeprazole (PRILOSEC) 20 MG capsule, TAKE 1 CAPSULE EVERY DAY, Disp: 30 capsule, Rfl: 0 .  simvastatin (ZOCOR) 40 MG tablet, TAKE 1 TABLET DAILY, Disp: 30 tablet, Rfl: 0 .  sulfaSALAzine (AZULFIDINE) 500 MG tablet, Take 1,000 mg by mouth 2 (two) times daily. Reported on 11/28/2015, Disp: , Rfl:  .  vitamin C (ASCORBIC ACID) 500 MG tablet, Take 500 mg by mouth daily., Disp: , Rfl:    Social History   Socioeconomic History  . Marital status: Legally Separated    Spouse name: Not on file  . Number of children: 1  . Years of education: Not on file  . Highest education  level: Not on file  Occupational History  . Occupation: taking care of parents  Social Needs  . Financial resource strain: Not on file  . Food insecurity:    Worry: Not on file    Inability: Not on file  . Transportation needs:    Medical: Not on file    Non-medical: Not on file  Tobacco Use  . Smoking status: Current Every Day Smoker    Packs/day: 1.00    Years: 36.00    Pack years: 36.00    Types: Cigarettes  . Smokeless tobacco: Current User  . Tobacco comment: less than Donna pack daily  Substance and Sexual Activity  . Alcohol use: No    Alcohol/week: 0.0 standard drinks  . Drug use: No  . Sexual activity: Not on file  Lifestyle  . Physical activity:    Days per week: Not on file    Minutes per session: Not on file  . Stress: Not on file  Relationships  . Social connections:    Talks on phone: Not on file    Gets together: Not on file    Attends religious service: Not on file    Active member of club or organization: Not on file    Attends meetings of clubs or organizations: Not on file    Relationship status: Not on file  . Intimate partner violence:    Fear of current or ex partner: Not on file    Emotionally abused: Not on file    Physically abused: Not on file    Forced sexual activity: Not on file  Other Topics Concern  . Not on file  Social History Narrative   The patient is divorced she has 1 sonWho is married that she has Donna grandchild.   She is not employed though the chart indicates she has cared for her parents   She does not use alcohol or drugs she drinks 3-4 caffeinated beverages daily   She is Donna smoker   Family History  Problem Relation Age of Onset  . Alzheimer's disease Mother   . Lung cancer Mother   . Diabetes Father   . Colon cancer Neg Hx     Objective: Office vital signs reviewed. BP 117/72   Pulse 79   Temp 97.6 F (36.4 C) (Oral)   Ht 5' (1.524 m)   Wt 202 lb (91.6 kg)   SpO2 97%   BMI 39.45 kg/m   Physical Examination:    General: Awake, alert, well nourished, coughs frequently during exam.  No acute distress HEENT: Normal    Eyes: PERRLA, extraocular membranes intact, sclera white    Nose: nasal turbinates moist, no nasal discharge    Throat: moist mucus membrane Cardio: regular rate and rhythm, S1S2 heard, no  murmurs appreciated Pulm: Mild crackles noted on RLL.  No wheezes, rhonchi; normal work of breathing on room air Extremities: warm, well perfused, No edema, cyanosis or clubbing; +2 pulses bilaterally  Dg Chest 2 View  Result Date: 05/16/2018 CLINICAL DATA:  Cough, history diabetes mellitus, hypertension EXAM: CHEST - 2 VIEW COMPARISON:  11/21/2017 FINDINGS: Borderline enlargement of cardiac silhouette. Mediastinal contours and pulmonary vascularity normal. Minimal chronic elevation of LEFT diaphragm. Lungs clear. No pulmonary infiltrate, pleural effusion or pneumothorax. Bones demineralized. IMPRESSION: No acute abnormalities. Electronically Signed   By: Lavonia Dana M.D.   On: 05/16/2018 15:53   Results for orders placed or performed in visit on 05/16/18 (from the past 24 hour(s))  Bayer DCA Hb A1c Waived     Status: None   Collection Time: 05/16/18  3:01 PM  Result Value Ref Range   HB A1C (BAYER DCA - WAIVED) 6.9 <7.0 %   Narrative   Performed at:  [address] [place], [place]  [id number] Lab Director: Suzann Lazaro [facility], Phone:  [phone]    Assessment/ Plan: 67 y.o. female   1. Type 2 diabetes mellitus with diabetic polyneuropathy, without long-term current use of insulin (HCC) A1c controlled at 6.9.  She is diet controlled.  I have recommended that she go ahead and get her diabetic eye exam done.  I have also placed Donna referral to podiatry for her nails.  Diabetic foot exam was performed today and did demonstrate decreased monofilament. - Bayer DCA Hb A1c Waived - Ambulatory referral to Podiatry - CMP14+EGFR  2. Dyspnea on exertion Difficult to tell if  this is related to smoking status versus new onset COPD.  Her physical exam was remarkable for what appeared to be Rales on the right lower lobe with inspiration and exhalation but chest x-ray was unremarkable.  I placed her on Tessalon Perles.  She is already on gabapentin which could be used to treat cough.  She is also already on Donna PPI.  I have placed Donna referral to pulmonology for further evaluation and formal PFTs.  Work of breathing and pulse ox on room air was within normal limits. - DG Chest 2 View; Future - CMP14+EGFR - CBC with Differential  3. Hypertension associated with diabetes ([place]) Controlled.  Refill sent of medications. - CMP14+EGFR  4. Gastroesophageal reflux disease with esophagitis - omeprazole (PRILOSEC) 20 MG capsule; Take 1 capsule (20 mg total) by mouth daily.  Dispense: 90 capsule; Refill: 0  5. Onychomycosis - Ambulatory referral to Podiatry  6. Neuropathy - gabapentin (NEURONTIN) 600 MG tablet; TAKE 2 TABLETS BY MOUTH TWICE DAILY  Dispense: 120 tablet; Refill: 2  7. Need for immunization against influenza - Flu Vaccine QUAD 36+ mos IM   Orders Placed This Encounter  Procedures  . DG Chest 2 View    Standing Status:   Future    Number of Occurrences:   1    Standing Expiration Date:   07/17/2019    Order Specific Question:   Reason for Exam (SYMPTOM  OR DIAGNOSIS REQUIRED)    Answer:   cough x4 months, DOE    Order Specific Question:   Preferred imaging location?    Answer:   Internal    Order Specific Question:   Radiology Contrast Protocol - do NOT remove file path    Answer:   [email or web address]  . Flu Vaccine QUAD 36+ mos IM  . Pneumococcal conjugate vaccine 13-valent  . Bayer DCA Hb A1c Waived  .  CMP14+EGFR  . CBC with Differential  . Ambulatory referral to Podiatry    Referral Priority:   Routine    Referral Type:   Consultation    Referral Reason:   Specialty Services Required    Requested Specialty:    Podiatry    Number of Visits Requested:   1  . Ambulatory referral to Pulmonology    Referral Priority:   Routine    Referral Type:   Consultation    Referral Reason:   Specialty Services Required    Requested Specialty:   Pulmonary Disease    Number of Visits Requested:   1   Meds ordered this encounter  Medications  . citalopram (CELEXA) 40 MG tablet    Sig: Take 1 tablet (40 mg total) by mouth daily. (Needs to be seen)    Dispense:  90 tablet    Refill:  3  . benzonatate (TESSALON PERLES) 100 MG capsule    Sig: Take 1 capsule (100 mg total) by mouth 3 (three) times daily as needed.    Dispense:  20 capsule    Refill:  0  . atenolol (TENORMIN) 25 MG tablet    Sig: Take 1 tablet (25 mg total) by mouth daily.    Dispense:  90 tablet    Refill:  0  . gabapentin (NEURONTIN) 600 MG tablet    Sig: TAKE 2 TABLETS BY MOUTH TWICE DAILY    Dispense:  120 tablet    Refill:  2  . omeprazole (PRILOSEC) 20 MG capsule    Sig: Take 1 capsule (20 mg total) by mouth daily.    Dispense:  90 capsule    Refill:  0    $  . simvastatin (ZOCOR) 40 MG tablet    Sig: Take 1 tablet (40 mg total) by mouth daily.    Dispense:  90 tablet    Refill:  0    $     [name], DO [place] [phone]

## 2018-05-16 NOTE — Patient Instructions (Addendum)
I will contact you with the results of your xray. I have refilled your medications. I have also sent a referral to the podiatrist for your nails.

## 2018-05-17 LAB — CMP14+EGFR
A/G RATIO: 1.9 (ref 1.2–2.2)
ALK PHOS: 84 IU/L (ref 39–117)
ALT: 41 IU/L — AB (ref 0–32)
AST: 26 IU/L (ref 0–40)
Albumin: 4.5 g/dL (ref 3.6–4.8)
BILIRUBIN TOTAL: 0.2 mg/dL (ref 0.0–1.2)
BUN/Creatinine Ratio: 14 (ref 12–28)
BUN: 13 mg/dL (ref 8–27)
CHLORIDE: 99 mmol/L (ref 96–106)
CO2: 24 mmol/L (ref 20–29)
Calcium: 9.3 mg/dL (ref 8.7–10.3)
Creatinine, Ser: 0.9 mg/dL (ref 0.57–1.00)
GFR calc non Af Amer: 67 mL/min/{1.73_m2} (ref >59)
GFR, EST AFRICAN AMERICAN: 78 mL/min/{1.73_m2} (ref >59)
GLOBULIN, TOTAL: 2.4 g/dL (ref 1.5–4.5)
Glucose: 144 mg/dL — ABNORMAL HIGH (ref 65–99)
POTASSIUM: 4.3 mmol/L (ref 3.5–5.2)
Sodium: 138 mmol/L (ref 134–144)
Total Protein: 6.9 g/dL (ref 6.0–8.5)

## 2018-05-17 LAB — CBC WITH DIFFERENTIAL/PLATELET
BASOS ABS: 0.1 10*3/uL (ref 0.0–0.2)
Basos: 1 %
EOS (ABSOLUTE): 0.1 10*3/uL (ref 0.0–0.4)
Eos: 1 %
Hematocrit: 42.5 % (ref 34.0–46.6)
Hemoglobin: 14.2 g/dL (ref 11.1–15.9)
Immature Grans (Abs): 0 10*3/uL (ref 0.0–0.1)
Immature Granulocytes: 0 %
LYMPHS ABS: 4.2 10*3/uL — AB (ref 0.7–3.1)
LYMPHS: 48 %
MCH: 30 pg (ref 26.6–33.0)
MCHC: 33.4 g/dL (ref 31.5–35.7)
MCV: 90 fL (ref 79–97)
Monocytes Absolute: 0.7 10*3/uL (ref 0.1–0.9)
Monocytes: 8 %
NEUTROS ABS: 3.7 10*3/uL (ref 1.4–7.0)
Neutrophils: 42 %
PLATELETS: 259 10*3/uL (ref 150–450)
RBC: 4.73 x10E6/uL (ref 3.77–5.28)
RDW: 12.9 % (ref 12.3–15.4)
WBC: 8.8 10*3/uL (ref 3.4–10.8)

## 2018-05-24 ENCOUNTER — Encounter: Payer: Self-pay | Admitting: Internal Medicine

## 2018-05-24 ENCOUNTER — Ambulatory Visit (INDEPENDENT_AMBULATORY_CARE_PROVIDER_SITE_OTHER): Payer: Medicare Other | Admitting: Internal Medicine

## 2018-05-24 ENCOUNTER — Other Ambulatory Visit (INDEPENDENT_AMBULATORY_CARE_PROVIDER_SITE_OTHER): Payer: Medicare Other

## 2018-05-24 DIAGNOSIS — K52832 Lymphocytic colitis: Secondary | ICD-10-CM

## 2018-05-24 LAB — IGA: IGA: 365 mg/dL (ref 68–378)

## 2018-05-24 NOTE — Patient Instructions (Signed)
    Glad things are better. Here is what you need to do:  1) Stop using the little white pill - diphenoxylate and atropine - it is to be used if you are having diarrhea - not all the time  2) If you are ok on 6 mg (2 budesonide capsules) after stopping the little white pill then try going to 3 mg or just one budesonide a day. Wait 1 week before you try this.  3) Go to the lab to have gluten allergy testing today  4) We will call results  5) Call with questions - remember goal is to only take budesonide and lowest amount  but we will do what is needed to keep the diarrhea away  I appreciate the opportunity to care for you. Gatha Mayer, MD, [facility]

## 2018-05-24 NOTE — Progress Notes (Signed)
Donna Rogers 67 y.o. 1951-02-13 [id number]  Assessment & Plan:  Lymphocytic colitis Titrate medications Stop Lomotil See lowest dose of budesonide Check celiac serology - done and TTG Ab negative F?U TBA    I appreciate the opportunity to care for this patient. [id number], Koleen Distance, DO   Subjective:   Chief Complaint: f/u lymphocytic colitis and diarrhea  HPI Donna Rogers is here w/ daughter and grandson and is very happy with sx control. She says she is able to do things w/o fear of diarrhea and incontinence. Is on 6 mg budesonide and still using some Lomotil (I think). Stools formed and 1-2 a day. Allergies  Allergen Reactions  . Ciprofloxacin Hcl Rash   Current Meds  Medication Sig  . aspirin 81 MG tablet Take 81 mg by mouth daily.  Marland Kitchen atenolol (TENORMIN) 25 MG tablet Take 1 tablet (25 mg total) by mouth daily.  . benzonatate (TESSALON PERLES) 100 MG capsule Take 1 capsule (100 mg total) by mouth 3 (three) times daily as needed.  . budesonide (ENTOCORT EC) 3 MG 24 hr capsule Take 6 mg by mouth daily.  . cholecalciferol (VITAMIN D) 1000 UNITS tablet Take 1,000 Units by mouth daily.  . citalopram (CELEXA) 40 MG tablet Take 1 tablet (40 mg total) by mouth daily. (Needs to be seen)  . diclofenac (VOLTAREN) 75 MG EC tablet Take 75 mg by mouth daily.  . diphenoxylate-atropine (LOMOTIL) 2.5-0.025 MG tablet 1-2 tablets ac/hs prn  . gabapentin (NEURONTIN) 600 MG tablet TAKE 2 TABLETS BY MOUTH TWICE DAILY  . HYDROcodone-acetaminophen (NORCO/VICODIN) 5-325 MG per tablet Take 1 tablet by mouth every 6 (six) hours as needed. (Patient taking differently: Take 1 tablet by mouth every 6 (six) hours as needed for moderate pain. )  . omeprazole (PRILOSEC) 20 MG capsule Take 1 capsule (20 mg total) by mouth daily.  . simvastatin (ZOCOR) 40 MG tablet Take 1 tablet (40 mg total) by mouth daily.  Marland Kitchen sulfaSALAzine (AZULFIDINE) 500 MG tablet Take 1,000 mg by mouth 2 (two) times daily. Reported  on 11/28/2015  . vitamin C (ASCORBIC ACID) 500 MG tablet Take 500 mg by mouth daily.   Past Medical History:  Diagnosis Date  . Anemia   . Arthritis    rheumatoid  . Depression   . Diabetes mellitus without complication ([place])   . GERD (gastroesophageal reflux disease)   . Headache   . Hyperlipidemia   . Hyperplastic rectal polyp 10/21/2015  . Hypertension    pt. denies  . Lymphocytic colitis   . Shortness of breath dyspnea    with exertion   Past Surgical History:  Procedure Laterality Date  . BREAST LUMPECTOMY WITH RADIOACTIVE SEED LOCALIZATION Left 03/03/2016   Procedure: BREAST LUMPECTOMY WITH RADIOACTIVE SEED LOCALIZATION;  Surgeon: Coralie Keens, MD;  Location: [facility];  Service: General;  Laterality: Left;  . COLONOSCOPY  08/2002   RMR: internal hemorrhoids  . COLONOSCOPY N/A 10/21/2015   Procedure: COLONOSCOPY;  Surgeon: Daneil Dolin, MD;  [facility];  Service: Endoscopy;  Laterality: N/A;  250 - moved to 2:35 - office to notify pt  . CYST REMOVAL TRUNK    . ESOPHAGOGASTRODUODENOSCOPY  08/2002   RMR: nonerosive reflux esophagitis, hiatal hernia  . TONSILLECTOMY     Social History   Social History Narrative   The patient is divorced she has 1 sonWho is married that she has a grandchild.   She is not employed though the chart indicates she has cared  for her parents   She does not use alcohol or drugs she drinks 3-4 caffeinated beverages daily   She is a smoker   family history includes Alzheimer's disease in her mother; Diabetes in her father; Lung cancer in her mother.   Review of Systems As above  Objective:   Physical Exam  BP (!) 152/94   Pulse 84   Ht 5' (1.524 m)   Wt 201 lb 3.2 oz (91.3 kg)   BMI 39.29 kg/m    15 minutes time spent with patient > half in counseling coordination of care

## 2018-05-24 NOTE — Assessment & Plan Note (Addendum)
Titrate medications Stop Lomotil See lowest dose of budesonide Check celiac serology - done and TTG Ab negative F?U TBA

## 2018-05-25 LAB — TISSUE TRANSGLUTAMINASE, IGA: (tTG) Ab, IgA: 1 U/mL

## 2018-05-27 NOTE — Progress Notes (Signed)
Let her know she does not have gluten allergy  She should continue with the plans I laid out in her visit - see patient instructions and double check  Have her see an APP in 4-6 weeks please for f/u  1

## 2018-05-31 DIAGNOSIS — L84 Corns and callosities: Secondary | ICD-10-CM | POA: Diagnosis not present

## 2018-05-31 DIAGNOSIS — B351 Tinea unguium: Secondary | ICD-10-CM | POA: Diagnosis not present

## 2018-05-31 DIAGNOSIS — E1142 Type 2 diabetes mellitus with diabetic polyneuropathy: Secondary | ICD-10-CM | POA: Diagnosis not present

## 2018-05-31 DIAGNOSIS — M79676 Pain in unspecified toe(s): Secondary | ICD-10-CM | POA: Diagnosis not present

## 2018-06-01 ENCOUNTER — Encounter: Payer: Self-pay | Admitting: Pulmonary Disease

## 2018-06-01 ENCOUNTER — Ambulatory Visit (INDEPENDENT_AMBULATORY_CARE_PROVIDER_SITE_OTHER): Payer: Medicare Other | Admitting: Pulmonary Disease

## 2018-06-01 VITALS — BP 144/88 | HR 95 | Ht 61.5 in | Wt 202.8 lb

## 2018-06-01 DIAGNOSIS — R05 Cough: Secondary | ICD-10-CM

## 2018-06-01 DIAGNOSIS — J418 Mixed simple and mucopurulent chronic bronchitis: Secondary | ICD-10-CM

## 2018-06-01 DIAGNOSIS — R059 Cough, unspecified: Secondary | ICD-10-CM

## 2018-06-01 MED ORDER — VARENICLINE TARTRATE 0.5 MG X 11 & 1 MG X 42 PO MISC
ORAL | 0 refills | Status: DC
Start: 2018-06-01 — End: 2018-07-08

## 2018-06-01 MED ORDER — TIOTROPIUM BROMIDE MONOHYDRATE 2.5 MCG/ACT IN AERS: 2.0000 | INHALATION_SPRAY | Freq: Every day | RESPIRATORY_TRACT | 0 refills | Status: DC

## 2018-06-01 MED ORDER — ALBUTEROL SULFATE HFA 108 (90 BASE) MCG/ACT IN AERS: 2.0000 | INHALATION_SPRAY | Freq: Four times a day (QID) | RESPIRATORY_TRACT | 5 refills | Status: DC | PRN

## 2018-06-01 NOTE — Progress Notes (Signed)
Synopsis: Referred in 2019 for chronic bronchitis >6 months in setting of active tobacco use. Symptoms worsened after influenza earlier this year. Not currently using bronchodilators.  Subjective:   PATIENT ID: Donna Rogers GENDER: female DOB: 1951-03-22, MRN: [id number]   HPI  Chief Complaint  Patient presents with  . Consult    Coughs all day and all night - cannot rest.  Symptoms began after having the 'flu' in spring 2019.  PCP referred her for consult.     [name] is a 67 year old smoker who presents to clinic for chronic cough and dyspnea. She has not been evaluated by a Pulmonologist for this issue. Since the beginning of this year after she had an episode of influenza/bronchitis, she reports productive cough with clear sputum. She has had similar symptoms in the past however not to this duration. Denies hemoptysis. Cough occurs throughout the day and night, interrupting her sleep. Associated symptoms include dyspnea, fatigue, dizziness. Also reports diaphoresis. Denies recent fevers, chills and weight loss.    She states nothing improves her symptoms. She has tried cough syrup/drops, mucinex, tessalon and other OTC medications however these do not provide any relief. Currently on gabapentin for neuropathy.  She reports smoking and activity worsens her symptoms. She continues to smoke and has reduced her use from 1 to 1/2 ppd. Denies hospitalizations in the last year related to her breathing. Has never required noninvasive or invasive ventilation.  Self-reports she is UTD on her pneumonia and influenza vaccinations.  Social: Smokes for 40 years x 1ppd. Currently retired. No known environmental exposures  I have personally reviewed clinic notes from 11/22/17, prior chest imaging, allergies, medications, past medical history, family and social history. CXR 05/16/18: No acute abnormalities. Mildly elevated left diaphragm  Past Medical History:  Diagnosis Date  .  Anemia   . Arthritis    rheumatoid  . Depression   . Diabetes ([place])   . Diabetes mellitus without complication ([place])   . GERD (gastroesophageal reflux disease)   . Headache   . Hyperlipidemia   . Hyperplastic rectal polyp 10/21/2015  . Hypertension    pt. denies  . Lymphocytic colitis   . Shortness of breath dyspnea    with exertion     Family History  Problem Relation Age of Onset  . Alzheimer's disease Mother   . Lung cancer Mother   . Diabetes Father   . Colon cancer Neg Hx      Social History   Socioeconomic History  . Marital status: Legally Separated    Spouse name: Not on file  . Number of children: 1  . Years of education: Not on file  . Highest education level: Not on file  Occupational History  . Occupation: taking care of parents  Social Needs  . Financial resource strain: Not on file  . Food insecurity:    Worry: Not on file    Inability: Not on file  . Transportation needs:    Medical: Not on file    Non-medical: Not on file  Tobacco Use  . Smoking status: Current Every Day Smoker    Packs/day: 0.50    Years: 36.00    Pack years: 18.00    Types: Cigarettes  . Smokeless tobacco: Current User  . Tobacco comment: less than a pack daily  Substance and Sexual Activity  . Alcohol use: No    Alcohol/week: 0.0 standard drinks  . Drug use: No  . Sexual activity: Not on file  Lifestyle  . Physical activity:    Days per week: Not on file    Minutes per session: Not on file  . Stress: Not on file  Relationships  . Social connections:    Talks on phone: Not on file    Gets together: Not on file    Attends religious service: Not on file    Active member of club or organization: Not on file    Attends meetings of clubs or organizations: Not on file    Relationship status: Not on file  . Intimate partner violence:    Fear of current or ex partner: Not on file    Emotionally abused: Not on file    Physically abused: Not on file    Forced sexual  activity: Not on file  Other Topics Concern  . Not on file  Social History Narrative   The patient is divorced she has 1 sonWho is married that she has a grandchild.   She is not employed though the chart indicates she has cared for her parents   She does not use alcohol or drugs she drinks 3-4 caffeinated beverages daily   She is a smoker     Allergies  Allergen Reactions  . Ciprofloxacin Hcl Rash     Outpatient Medications Prior to Visit  Medication Sig Dispense Refill  . aspirin 81 MG tablet Take 81 mg by mouth daily.    Marland Kitchen atenolol (TENORMIN) 25 MG tablet Take 1 tablet (25 mg total) by mouth daily. 90 tablet 0  . benzonatate (TESSALON PERLES) 100 MG capsule Take 1 capsule (100 mg total) by mouth 3 (three) times daily as needed. 20 capsule 0  . budesonide (ENTOCORT EC) 3 MG 24 hr capsule Take 6 mg by mouth daily.    . cholecalciferol (VITAMIN D) 1000 UNITS tablet Take 1,000 Units by mouth daily.    . citalopram (CELEXA) 40 MG tablet Take 1 tablet (40 mg total) by mouth daily. (Needs to be seen) 90 tablet 3  . diclofenac (VOLTAREN) 75 MG EC tablet Take 75 mg by mouth daily.    . diphenoxylate-atropine (LOMOTIL) 2.5-0.025 MG tablet 1-2 tablets ac/hs prn 180 tablet 1  . gabapentin (NEURONTIN) 600 MG tablet TAKE 2 TABLETS BY MOUTH TWICE DAILY 120 tablet 2  . HYDROcodone-acetaminophen (NORCO/VICODIN) 5-325 MG per tablet Take 1 tablet by mouth every 6 (six) hours as needed. (Patient taking differently: Take 1 tablet by mouth every 6 (six) hours as needed for moderate pain. ) 60 tablet 0  . omeprazole (PRILOSEC) 20 MG capsule Take 1 capsule (20 mg total) by mouth daily. 90 capsule 0  . simvastatin (ZOCOR) 40 MG tablet Take 1 tablet (40 mg total) by mouth daily. 90 tablet 0  . sulfaSALAzine (AZULFIDINE) 500 MG tablet Take 1,000 mg by mouth 2 (two) times daily. Reported on 11/28/2015    . vitamin C (ASCORBIC ACID) 500 MG tablet Take 500 mg by mouth daily.     No facility-administered  medications prior to visit.     Review of Systems  Constitutional: Positive for diaphoresis and malaise/fatigue. Negative for chills, fever and weight loss.  HENT: Negative for congestion and sinus pain.   Respiratory: Positive for cough, sputum production, shortness of breath and wheezing. Negative for hemoptysis.   Cardiovascular: Positive for PND. Negative for chest pain and leg swelling.  Gastrointestinal: Negative for abdominal pain, constipation, diarrhea and nausea.  Musculoskeletal: Negative for myalgias.  Skin: Negative for rash.  Neurological: Positive for dizziness,  weakness and headaches. Negative for focal weakness and loss of consciousness.  Endo/Heme/Allergies: Does not bruise/bleed easily.  Psychiatric/Behavioral: Negative for suicidal ideas.    Objective:  Physical Exam Today's Vitals   06/01/18 1030  BP: (!) 144/88  Pulse: 95  SpO2: 94%  Weight: 202 lb 12.8 oz (92 kg)  Height: 5' 1.5" (1.562 m)   Body mass index is 37.7 kg/m.  Gen: obese, no acute distress HENT: NCAT, OP clear, neck supple without masses Eyes: EOMi Lymph: no cervical lymphadenopathy PULM: CTA B CV: RRR, no mgr, no JVD GI: BS+, soft, nontender, no hsm Derm: no rash or skin breakdown MSK: normal bulk and tone Neuro: A&Ox4, CN II-XII intact Psyche: normal mood and affect  CBC    Component Value Date/Time   WBC 8.8 05/16/2018 1621   WBC 9.2 02/27/2016 1526   RBC 4.73 05/16/2018 1621   RBC 4.73 02/27/2016 1526   HGB 14.2 05/16/2018 1621   HCT 42.5 05/16/2018 1621   PLT 259 05/16/2018 1621   MCV 90 05/16/2018 1621   MCH 30.0 05/16/2018 1621   MCH 29.2 02/27/2016 1526   MCHC 33.4 05/16/2018 1621   MCHC 32.6 02/27/2016 1526   RDW 12.9 05/16/2018 1621   LYMPHSABS 4.2 (H) 05/16/2018 1621   EOSABS 0.1 05/16/2018 1621   BASOSABS 0.1 05/16/2018 1621     Chest imaging:  CXR 05/16/18: No acute abnormalities. Mildly elevated left diaphragm  PFT: No test on file  Echo: No test on  file     Assessment & Plan:   Cough - Plan: Pulmonary function test  Discussion: 67 year old female who presents with chronic productive cough, dyspnea and wheezing consistent with chronic bronchitis. Will obtain PFTs and start bronchodilators. She is an active smoker however wishes to quit. We discussed smoking cessation aids including wellbutrin vs chantix. Will avoid wellbutrin due to her concurrent SSRI. Patient agreed to chantix after discussing risks.  Chronic cough and shortness of breath: We will order breathing test (full pulmonary function test) before you next visit in one month. Please start Spiriva Respimat 2.59mcg 2 puffs once a day Albuterol inhaler has been prescribed. Please continue your albuterol every 3-4 hours as needed for shortness of breath or wheezing.  If you experience worsening shortness of breath or wheezing that does not improve despite inhaler use, please seek medical attention immediately.  Tobacco History: Discussed and provided handout on smoking cessation for <3 minutes. Discussed risks of Chantix including suicidal ideation. Patient expressed understanding. We will schedule CT lung screen at next visit.  Please follow-up in one month.  Caydan Mctavish Rodman Pickle, MD [facility] 06/01/2018 7:10 PM  Personal pager: [phone] If unanswered, please page [facility] On-call: [phone]    Current Outpatient Medications:  .  aspirin 81 MG tablet, Take 81 mg by mouth daily., Disp: , Rfl:  .  atenolol (TENORMIN) 25 MG tablet, Take 1 tablet (25 mg total) by mouth daily., Disp: 90 tablet, Rfl: 0 .  benzonatate (TESSALON PERLES) 100 MG capsule, Take 1 capsule (100 mg total) by mouth 3 (three) times daily as needed., Disp: 20 capsule, Rfl: 0 .  budesonide (ENTOCORT EC) 3 MG 24 hr capsule, Take 6 mg by mouth daily., Disp: , Rfl:  .  cholecalciferol (VITAMIN D) 1000 UNITS tablet, Take 1,000 Units by mouth daily., Disp: , Rfl:  .  citalopram  (CELEXA) 40 MG tablet, Take 1 tablet (40 mg total) by mouth daily. (Needs to be seen), Disp: 90 tablet,  Rfl: 3 .  diclofenac (VOLTAREN) 75 MG EC tablet, Take 75 mg by mouth daily., Disp: , Rfl:  .  diphenoxylate-atropine (LOMOTIL) 2.5-0.025 MG tablet, 1-2 tablets ac/hs prn, Disp: 180 tablet, Rfl: 1 .  gabapentin (NEURONTIN) 600 MG tablet, TAKE 2 TABLETS BY MOUTH TWICE DAILY, Disp: 120 tablet, Rfl: 2 .  HYDROcodone-acetaminophen (NORCO/VICODIN) 5-325 MG per tablet, Take 1 tablet by mouth every 6 (six) hours as needed. (Patient taking differently: Take 1 tablet by mouth every 6 (six) hours as needed for moderate pain. ), Disp: 60 tablet, Rfl: 0 .  omeprazole (PRILOSEC) 20 MG capsule, Take 1 capsule (20 mg total) by mouth daily., Disp: 90 capsule, Rfl: 0 .  simvastatin (ZOCOR) 40 MG tablet, Take 1 tablet (40 mg total) by mouth daily., Disp: 90 tablet, Rfl: 0 .  sulfaSALAzine (AZULFIDINE) 500 MG tablet, Take 1,000 mg by mouth 2 (two) times daily. Reported on 11/28/2015, Disp: , Rfl:  .  vitamin C (ASCORBIC ACID) 500 MG tablet, Take 500 mg by mouth daily., Disp: , Rfl:  .  albuterol (PROVENTIL HFA;VENTOLIN HFA) 108 (90 Base) MCG/ACT inhaler, Inhale 2 puffs into the lungs every 6 (six) hours as needed for wheezing or shortness of breath., Disp: 1 Inhaler, Rfl: 5 .  Tiotropium Bromide Monohydrate (SPIRIVA RESPIMAT) 2.5 MCG/ACT AERS, Inhale 2 puffs into the lungs daily., Disp: 2 Inhaler, Rfl: 0 .  varenicline (CHANTIX PAK) 0.5 MG X 11 & 1 MG X 42 tablet, Take one 0.5 mg tablet by mouth once daily for 3 days, then increase to one 0.5 mg tablet twice daily for 4 days, then increase to one 1 mg tablet twice daily., Disp: 53 tablet, Rfl: 0

## 2018-06-01 NOTE — Patient Instructions (Addendum)
Shortness of breath: We will order breathing test (full pulmonary function test) before you next visit in one month. Please start Spiriva Respimat 2.51mcg 2 puffs once a day Albuterol inhaler has been prescribed. Please continue your albuterol every 3-4 hours as needed for shortness of breath or wheezing.  If you experience worsening shortness of breath or wheezing that does not improve despite inhaler use, please seek medical attention immediately.  Tobacco History: Discussed and provided handout on smoking cessation. Discussed risks of Chantix including suicidal ideation. Patient expressed understanding. We will schedule CT lung screen at next visit.  Please follow-up in one month.

## 2018-06-30 ENCOUNTER — Ambulatory Visit (INDEPENDENT_AMBULATORY_CARE_PROVIDER_SITE_OTHER): Payer: Medicare Other | Admitting: Gastroenterology

## 2018-06-30 ENCOUNTER — Encounter: Payer: Self-pay | Admitting: Gastroenterology

## 2018-06-30 VITALS — BP 134/72 | HR 97 | Ht 60.63 in | Wt 206.0 lb

## 2018-06-30 DIAGNOSIS — K52832 Lymphocytic colitis: Secondary | ICD-10-CM | POA: Diagnosis not present

## 2018-06-30 NOTE — Progress Notes (Signed)
06/30/2018 CRUZ DEVILLA [id number] 1951/05/09   HISTORY OF PRESENT ILLNESS:  This is a 67 year old female who is a patient of [name].  She follows here for her lymphocytic colitis.  Was started on Entocort in 11/2017, which has been tapered since that time.  Was last seen here on 05/24/2018 at which time she was on 6 mg and was doing well so it was recommended that she taper it down to 3 mg daily.  Is here for follow-up again.  Is doing very well.  Says that she has about one BM every day and some days it is even a little hard for her to go.  She has been very satisfied with results.   Past Medical History:  Diagnosis Date  . Anemia   . Arthritis    rheumatoid  . Depression   . Diabetes ([place])   . Diabetes mellitus without complication ([place])   . GERD (gastroesophageal reflux disease)   . Headache   . Hyperlipidemia   . Hyperplastic rectal polyp 10/21/2015  . Hypertension    pt. denies  . Lymphocytic colitis   . Shortness of breath dyspnea    with exertion   Past Surgical History:  Procedure Laterality Date  . BREAST LUMPECTOMY Right 2018  . BREAST LUMPECTOMY WITH RADIOACTIVE SEED LOCALIZATION Left 03/03/2016   Procedure: BREAST LUMPECTOMY WITH RADIOACTIVE SEED LOCALIZATION;  Surgeon: Coralie Keens, MD;  Location: [facility];  Service: General;  Laterality: Left;  . COLONOSCOPY  08/2002   RMR: internal hemorrhoids  . COLONOSCOPY N/A 10/21/2015   Procedure: COLONOSCOPY;  Surgeon: Daneil Dolin, MD;  [facility];  Service: Endoscopy;  Laterality: N/A;  250 - moved to 2:35 - office to notify pt  . CYST REMOVAL TRUNK    . ESOPHAGOGASTRODUODENOSCOPY  08/2002   RMR: nonerosive reflux esophagitis, hiatal hernia  . TONSILLECTOMY      reports that she has been smoking cigarettes. She has a 18.00 pack-year smoking history. She uses smokeless tobacco. She reports that she does not drink alcohol or use drugs. family history includes Alzheimer's disease in her mother;  Diabetes in her father; Lung cancer in her mother. Allergies  Allergen Reactions  . Ciprofloxacin Hcl Rash      Outpatient Encounter Medications as of 06/30/2018  Medication Sig  . albuterol (PROVENTIL HFA;VENTOLIN HFA) 108 (90 Base) MCG/ACT inhaler Inhale 2 puffs into the lungs every 6 (six) hours as needed for wheezing or shortness of breath.  Marland Kitchen aspirin 81 MG tablet Take 81 mg by mouth daily.  Marland Kitchen atenolol (TENORMIN) 25 MG tablet Take 1 tablet (25 mg total) by mouth daily.  . benzonatate (TESSALON PERLES) 100 MG capsule Take 1 capsule (100 mg total) by mouth 3 (three) times daily as needed.  . budesonide (ENTOCORT EC) 3 MG 24 hr capsule Take 6 mg by mouth daily.  . cholecalciferol (VITAMIN D) 1000 UNITS tablet Take 1,000 Units by mouth daily.  . citalopram (CELEXA) 40 MG tablet Take 1 tablet (40 mg total) by mouth daily. (Needs to be seen)  . diclofenac (VOLTAREN) 75 MG EC tablet Take 75 mg by mouth daily.  . diphenoxylate-atropine (LOMOTIL) 2.5-0.025 MG tablet 1-2 tablets ac/hs prn  . gabapentin (NEURONTIN) 600 MG tablet TAKE 2 TABLETS BY MOUTH TWICE DAILY  . HYDROcodone-acetaminophen (NORCO/VICODIN) 5-325 MG per tablet Take 1 tablet by mouth every 6 (six) hours as needed. (Patient taking differently: Take 1 tablet by mouth every 6 (six) hours  as needed for moderate pain. )  . omeprazole (PRILOSEC) 20 MG capsule Take 1 capsule (20 mg total) by mouth daily.  . simvastatin (ZOCOR) 40 MG tablet Take 1 tablet (40 mg total) by mouth daily.  Marland Kitchen sulfaSALAzine (AZULFIDINE) 500 MG tablet Take 1,000 mg by mouth 2 (two) times daily. Reported on 11/28/2015  . Tiotropium Bromide Monohydrate (SPIRIVA RESPIMAT) 2.5 MCG/ACT AERS Inhale 2 puffs into the lungs daily.  . varenicline (CHANTIX PAK) 0.5 MG X 11 & 1 MG X 42 tablet Take one 0.5 mg tablet by mouth once daily for 3 days, then increase to one 0.5 mg tablet twice daily for 4 days, then increase to one 1 mg tablet twice daily.  . vitamin C (ASCORBIC  ACID) 500 MG tablet Take 500 mg by mouth daily.   No facility-administered encounter medications on file as of 06/30/2018.      REVIEW OF SYSTEMS  : All other systems reviewed and negative except where noted in the History of Present Illness.   PHYSICAL EXAM: BP 134/72   Pulse 97   Ht 5' 0.63" (1.54 m)   Wt 206 lb (93.4 kg)   SpO2 96%   BMI 39.40 kg/m  General: Well developed white female in no acute distress Head: Normocephalic and atraumatic Eyes:  Sclerae anicteric, conjunctiva pink. Ears: Normal auditory acuity Lungs: Clear throughout to auscultation; no increased WOB. Heart: Regular rate and rhythm; no M/R/G. Abdomen: Soft, non-distended.  BS present.  Non-tender. Musculoskeletal: Symmetrical with no gross deformities  Skin: No lesions on visible extremities Extremities: No edema  Neurological: Alert oriented x 4, grossly non-focal Psychological:  Alert and cooperative. Normal mood and affect  ASSESSMENT AND PLAN: *Lymphocytic colitis:  Doing well on entocort 3 mg daily.  Will try to discontinue it altogether and see how she does.  If diarrhea returns then she will call us back.  If returns quickly after discontinuation then may just need to resume 3 mg and keep her on that longer or possibly even chronically.   CC:  [name], DO

## 2018-06-30 NOTE — Patient Instructions (Signed)
If you are age 65 or older, your body mass index should be between 23-30. Your Body mass index is 39.4 kg/m. If this is out of the aforementioned range listed, please consider follow up with your Primary Care Provider.  If you are age 23 or younger, your body mass index should be between 19-25. Your Body mass index is 39.4 kg/m. If this is out of the aformentioned range listed, please consider follow up with your Primary Care Provider.   Stop taking the Entocort. Call back if symptoms return.  Follow up as needed.   It was a pleasure to see you today!

## 2018-07-08 ENCOUNTER — Ambulatory Visit (INDEPENDENT_AMBULATORY_CARE_PROVIDER_SITE_OTHER): Payer: Medicare Other | Admitting: Pulmonary Disease

## 2018-07-08 ENCOUNTER — Encounter: Payer: Self-pay | Admitting: Pulmonary Disease

## 2018-07-08 VITALS — BP 130/80 | Ht 61.5 in | Wt 200.2 lb

## 2018-07-08 DIAGNOSIS — R05 Cough: Secondary | ICD-10-CM | POA: Diagnosis not present

## 2018-07-08 DIAGNOSIS — J42 Unspecified chronic bronchitis: Secondary | ICD-10-CM | POA: Diagnosis not present

## 2018-07-08 DIAGNOSIS — R059 Cough, unspecified: Secondary | ICD-10-CM

## 2018-07-08 LAB — PULMONARY FUNCTION TEST
DL/VA % pred: 105 %
DL/VA: 4.65 ml/min/mmHg/L
DLCO COR % PRED: 92 %
DLCO COR: 18.81 ml/min/mmHg
DLCO UNC % PRED: 94 %
DLCO UNC: 19.26 ml/min/mmHg
FEF 25-75 POST: 2.71 L/s
FEF 25-75 PRE: 2.97 L/s
FEF2575-%Change-Post: -8 %
FEF2575-%PRED-POST: 137 %
FEF2575-%PRED-PRE: 151 %
FEV1-%CHANGE-POST: 0 %
FEV1-%PRED-POST: 97 %
FEV1-%Pred-Pre: 97 %
FEV1-Post: 2.1 L
FEV1-Pre: 2.1 L
FEV1FVC-%Change-Post: -1 %
FEV1FVC-%PRED-PRE: 113 %
FEV6-%Change-Post: 2 %
FEV6-%PRED-POST: 90 %
FEV6-%Pred-Pre: 88 %
FEV6-Post: 2.44 L
FEV6-Pre: 2.39 L
FEV6FVC-%Pred-Post: 104 %
FEV6FVC-%Pred-Pre: 104 %
FVC-%Change-Post: 1 %
FVC-%Pred-Post: 86 %
FVC-%Pred-Pre: 85 %
FVC-Post: 2.44 L
FVC-Pre: 2.4 L
POST FEV1/FVC RATIO: 86 %
PRE FEV1/FVC RATIO: 88 %
PRE FEV6/FVC RATIO: 100 %
Post FEV6/FVC ratio: 100 %
RV % PRED: 97 %
RV: 1.9 L
TLC % pred: 97 %
TLC: 4.5 L

## 2018-07-08 MED ORDER — TIOTROPIUM BROMIDE MONOHYDRATE 2.5 MCG/ACT IN AERS: 2.0000 | INHALATION_SPRAY | Freq: Every day | RESPIRATORY_TRACT | 3 refills | Status: DC

## 2018-07-08 NOTE — Progress Notes (Signed)
Synopsis: Referred in 2019 for chronic bronchitis >6 months in setting of active tobacco use. Symptoms worsened after influenza earlier this year. Not currently using bronchodilators.  Subjective:   PATIENT ID: Donna Rogers GENDER: female DOB: June 15, 1951, MRN: [id number]   HPI  Chief Complaint  Patient presents with  . Follow-up    stopped taking inhalers due to sore tongue.  Felt a little better with less cough for a short time then worse again.   Donna Rogers is a 67 year old active smoker who presents for follow-up of chronic cough.  Her last visit she was started on Spiriva and noticed market improvement of her productive cough with clear sputum, shortness of breath and wheezing however she noticed that the medication was coating her tongue and this felt uncomfortable so she discontinued the inhaler 2 weeks ago.  After discontinuation of the inhaler she began to have recurrent symptoms associated with fatigue and general weakness.  Her cough occurs during the day and night which interrupts her sleep and contributes to her tiredness.  Denies fevers, chills.  Has recently had to increase her omeprazole to twice a day for worsening reflux. She feels like her food feels stuck in her throat. Denies problems swallowing or choking on food. She is followed by GI for lymphocytic colitis. Believes she had a colonoscopy 4 years ago but not sure if she had EGD.   She continues to smoke.  During her last visit we prescribed Chantix however the price was over $100 which she cannot afford.  She was to discuss patches.   Self-reports she is UTD on her pneumonia and influenza vaccinations.  Social: Smokes for 40 years x 1ppd. Currently retired. No known environmental exposures  I have personally reviewed past medical history, social, family, medications and allergies.  Past Medical History:  Diagnosis Date  . Anemia   . Arthritis    rheumatoid  . Depression   . Diabetes ([place])   .  Diabetes mellitus without complication ([place])   . GERD (gastroesophageal reflux disease)   . Headache   . Hyperlipidemia   . Hyperplastic rectal polyp 10/21/2015  . Hypertension    pt. denies  . Lymphocytic colitis   . Shortness of breath dyspnea    with exertion     Family History  Problem Relation Age of Onset  . Alzheimer's disease Mother   . Lung cancer Mother   . Diabetes Father   . Colon cancer Neg Hx   . Stomach cancer Neg Hx      Social History   Socioeconomic History  . Marital status: Legally Separated    Spouse name: Not on file  . Number of children: 1  . Years of education: Not on file  . Highest education level: Not on file  Occupational History  . Occupation: taking care of parents  Social Needs  . Financial resource strain: Not on file  . Food insecurity:    Worry: Not on file    Inability: Not on file  . Transportation needs:    Medical: Not on file    Non-medical: Not on file  Tobacco Use  . Smoking status: Current Every Day Smoker    Packs/day: 0.50    Years: 36.00    Pack years: 18.00    Types: Cigarettes  . Smokeless tobacco: Current User  . Tobacco comment: less than a pack daily  Substance and Sexual Activity  . Alcohol use: No    Alcohol/week: 0.0 standard drinks  .  Drug use: No  . Sexual activity: Not Currently  Lifestyle  . Physical activity:    Days per week: Not on file    Minutes per session: Not on file  . Stress: Not on file  Relationships  . Social connections:    Talks on phone: Not on file    Gets together: Not on file    Attends religious service: Not on file    Active member of club or organization: Not on file    Attends meetings of clubs or organizations: Not on file    Relationship status: Not on file  . Intimate partner violence:    Fear of current or ex partner: Not on file    Emotionally abused: Not on file    Physically abused: Not on file    Forced sexual activity: Not on file  Other Topics Concern  .  Not on file  Social History Narrative   The patient is divorced she has 1 sonWho is married that she has a grandchild.   She is not employed though the chart indicates she has cared for her parents   She does not use alcohol or drugs she drinks 3-4 caffeinated beverages daily   She is a smoker     Allergies  Allergen Reactions  . Ciprofloxacin Hcl Rash     Outpatient Medications Prior to Visit  Medication Sig Dispense Refill  . aspirin 81 MG tablet Take 81 mg by mouth daily.    Marland Kitchen atenolol (TENORMIN) 25 MG tablet Take 1 tablet (25 mg total) by mouth daily. 90 tablet 0  . benzonatate (TESSALON PERLES) 100 MG capsule Take 1 capsule (100 mg total) by mouth 3 (three) times daily as needed. 20 capsule 0  . budesonide (ENTOCORT EC) 3 MG 24 hr capsule Take 6 mg by mouth daily.    . cholecalciferol (VITAMIN D) 1000 UNITS tablet Take 1,000 Units by mouth daily.    . citalopram (CELEXA) 40 MG tablet Take 1 tablet (40 mg total) by mouth daily. (Needs to be seen) 90 tablet 3  . diclofenac (VOLTAREN) 75 MG EC tablet Take 75 mg by mouth daily.    . diphenoxylate-atropine (LOMOTIL) 2.5-0.025 MG tablet 1-2 tablets ac/hs prn 180 tablet 1  . gabapentin (NEURONTIN) 600 MG tablet TAKE 2 TABLETS BY MOUTH TWICE DAILY 120 tablet 2  . HYDROcodone-acetaminophen (NORCO/VICODIN) 5-325 MG per tablet Take 1 tablet by mouth every 6 (six) hours as needed. (Patient taking differently: Take 1 tablet by mouth every 6 (six) hours as needed for moderate pain. ) 60 tablet 0  . omeprazole (PRILOSEC) 20 MG capsule Take 1 capsule (20 mg total) by mouth daily. 90 capsule 0  . simvastatin (ZOCOR) 40 MG tablet Take 1 tablet (40 mg total) by mouth daily. 90 tablet 0  . sulfaSALAzine (AZULFIDINE) 500 MG tablet Take 1,000 mg by mouth 2 (two) times daily. Reported on 11/28/2015    . vitamin C (ASCORBIC ACID) 500 MG tablet Take 500 mg by mouth daily.    Marland Kitchen albuterol (PROVENTIL HFA;VENTOLIN HFA) 108 (90 Base) MCG/ACT inhaler Inhale 2  puffs into the lungs every 6 (six) hours as needed for wheezing or shortness of breath. (Patient not taking: Reported on 07/08/2018) 1 Inhaler 5  . Tiotropium Bromide Monohydrate (SPIRIVA RESPIMAT) 2.5 MCG/ACT AERS Inhale 2 puffs into the lungs daily. (Patient not taking: Reported on 07/08/2018) 2 Inhaler 0  . varenicline (CHANTIX PAK) 0.5 MG X 11 & 1 MG X 42 tablet Take one  0.5 mg tablet by mouth once daily for 3 days, then increase to one 0.5 mg tablet twice daily for 4 days, then increase to one 1 mg tablet twice daily. 53 tablet 0   No facility-administered medications prior to visit.     Review of Systems  Constitutional: Positive for malaise/fatigue. Negative for chills, diaphoresis, fever and weight loss.  HENT: Positive for congestion. Negative for sore throat.   Eyes: Negative for blurred vision.  Respiratory: Positive for cough, sputum production, shortness of breath and wheezing. Negative for hemoptysis.   Cardiovascular: Positive for PND. Negative for chest pain and palpitations.  Gastrointestinal: Positive for heartburn. Negative for abdominal pain, constipation, diarrhea and nausea.  Genitourinary: Negative for frequency.  Musculoskeletal: Positive for myalgias.  Skin: Negative for rash.  Neurological: Positive for weakness. Negative for dizziness, loss of consciousness and headaches.  Endo/Heme/Allergies: Negative for environmental allergies.  Psychiatric/Behavioral: Positive for depression.    Objective:  Physical Exam  Constitutional: She is oriented to person, place, and time. She appears well-developed and well-nourished. No distress.  HENT:  Head: Normocephalic and atraumatic.  Nose: Nose normal.  Eyes: Conjunctivae and EOM are normal. No scleral icterus.  Neck: Normal range of motion. Neck supple. No JVD present. No tracheal deviation present.  Cardiovascular: Normal rate, regular rhythm, normal heart sounds and intact distal pulses. Exam reveals no gallop and no  friction rub.  No murmur heard. Pulmonary/Chest: Effort normal and breath sounds normal. No stridor. No respiratory distress. She has no wheezes. She has no rales.  Abdominal: Soft. Bowel sounds are normal. She exhibits no distension. There is no tenderness.  Musculoskeletal: Normal range of motion. She exhibits no edema or deformity.  Neurological: She is alert and oriented to person, place, and time. No cranial nerve deficit or sensory deficit.  Skin: Skin is warm and dry. No rash noted. She is not diaphoretic. No erythema. No pallor.  Psychiatric: She has a normal mood and affect. Her behavior is normal. Judgment and thought content normal.  Vitals reviewed.  Today's Vitals   07/08/18 1335  BP: 130/80  SpO2: 94%  Weight: 200 lb 3.2 oz (90.8 kg)  Height: 5' 1.5" (1.562 m)   Body mass index is 37.21 kg/m.   CBC    Component Value Date/Time   WBC 8.8 05/16/2018 1621   WBC 9.2 02/27/2016 1526   RBC 4.73 05/16/2018 1621   RBC 4.73 02/27/2016 1526   HGB 14.2 05/16/2018 1621   HCT 42.5 05/16/2018 1621   PLT 259 05/16/2018 1621   MCV 90 05/16/2018 1621   MCH 30.0 05/16/2018 1621   MCH 29.2 02/27/2016 1526   MCHC 33.4 05/16/2018 1621   MCHC 32.6 02/27/2016 1526   RDW 12.9 05/16/2018 1621   LYMPHSABS 4.2 (H) 05/16/2018 1621   EOSABS 0.1 05/16/2018 1621   BASOSABS 0.1 05/16/2018 1621     Chest imaging:  CXR 05/16/18: No acute abnormalities. Mildly elevated left diaphragm  PFT:  07/08/18 - Normal spirometry  Echo: No test on file     Assessment & Plan:   Chronic bronchitis, unspecified chronic bronchitis type ([place]) - Plan: Ambulatory Referral for Lung Cancer Scre  Discussion: 67 year old female presents for management of chronic bronchitis.  On this visit PFTs reviewed and normal.  Did report improvement of symptoms with bronchodilator use.  Still an active smoker however she wishes to quit.  Chronic cough PFTs without evidence of asthma. Uncontrolled reflux may be  contributing to her symptoms. Patient denies allergies. --INCREASE  omeprazole to 20 mg twice a day  Chronic bronchitis No obstructive defect on PFTs.  However may still benefit from bronchodilator use. --CONTINUE Spiriva Respimat 2.30mcg 2 puffs once a day as this may still provide symptomatic relief. Rinse mouth after use. --CONTINUE Albuterol inhaler 2 puffs every 4-6 hours as needed for shortness of breath or wheezing  Tobacco Abuse Patient is an active smoker. We discussed barriers to continued smoking and benefits of smoking cessation. Provided patient with information cessation techniques and interventions including [place] quitline. --Nicotine patches Patients smoking ?10 cigarettes/day: Begin with step 2 (14 mg/day) for 6 weeks, followed by step 3 (7 mg/day) for 2 weeks --Lozenges as directed --CT lung screen referral  Follow-up in 6-8 weeks  Chi Rodman Pickle, MD [facility] 07/10/2018 2:12 PM  Personal pager: [phone] If unanswered, please page [facility] On-call: [phone]    Current Outpatient Medications:  .  aspirin 81 MG tablet, Take 81 mg by mouth daily., Disp: , Rfl:  .  atenolol (TENORMIN) 25 MG tablet, Take 1 tablet (25 mg total) by mouth daily., Disp: 90 tablet, Rfl: 0 .  benzonatate (TESSALON PERLES) 100 MG capsule, Take 1 capsule (100 mg total) by mouth 3 (three) times daily as needed., Disp: 20 capsule, Rfl: 0 .  budesonide (ENTOCORT EC) 3 MG 24 hr capsule, Take 6 mg by mouth daily., Disp: , Rfl:  .  cholecalciferol (VITAMIN D) 1000 UNITS tablet, Take 1,000 Units by mouth daily., Disp: , Rfl:  .  citalopram (CELEXA) 40 MG tablet, Take 1 tablet (40 mg total) by mouth daily. (Needs to be seen), Disp: 90 tablet, Rfl: 3 .  diclofenac (VOLTAREN) 75 MG EC tablet, Take 75 mg by mouth daily., Disp: , Rfl:  .  diphenoxylate-atropine (LOMOTIL) 2.5-0.025 MG tablet, 1-2 tablets ac/hs prn, Disp: 180 tablet, Rfl: 1 .  gabapentin (NEURONTIN) 600 MG tablet,  TAKE 2 TABLETS BY MOUTH TWICE DAILY, Disp: 120 tablet, Rfl: 2 .  HYDROcodone-acetaminophen (NORCO/VICODIN) 5-325 MG per tablet, Take 1 tablet by mouth every 6 (six) hours as needed. (Patient taking differently: Take 1 tablet by mouth every 6 (six) hours as needed for moderate pain. ), Disp: 60 tablet, Rfl: 0 .  omeprazole (PRILOSEC) 20 MG capsule, Take 1 capsule (20 mg total) by mouth daily., Disp: 90 capsule, Rfl: 0 .  simvastatin (ZOCOR) 40 MG tablet, Take 1 tablet (40 mg total) by mouth daily., Disp: 90 tablet, Rfl: 0 .  sulfaSALAzine (AZULFIDINE) 500 MG tablet, Take 1,000 mg by mouth 2 (two) times daily. Reported on 11/28/2015, Disp: , Rfl:  .  vitamin C (ASCORBIC ACID) 500 MG tablet, Take 500 mg by mouth daily., Disp: , Rfl:  .  albuterol (PROVENTIL HFA;VENTOLIN HFA) 108 (90 Base) MCG/ACT inhaler, Inhale 2 puffs into the lungs every 6 (six) hours as needed for wheezing or shortness of breath. (Patient not taking: Reported on 07/08/2018), Disp: 1 Inhaler, Rfl: 5 .  Tiotropium Bromide Monohydrate (SPIRIVA RESPIMAT) 2.5 MCG/ACT AERS, Inhale 2 puffs into the lungs daily. (Patient not taking: Reported on 07/08/2018), Disp: 2 Inhaler, Rfl: 0 .  Tiotropium Bromide Monohydrate (SPIRIVA RESPIMAT) 2.5 MCG/ACT AERS, Inhale 2 puffs into the lungs daily., Disp: 1 Inhaler, Rfl: 3

## 2018-07-08 NOTE — Progress Notes (Signed)
PFT completed today. 07/08/18

## 2018-07-08 NOTE — Patient Instructions (Addendum)
For your chronic cough: --Your lung function tests are normal --CONTINUE Spiriva Respimat 2.22mcg 2 puffs once a day as this may still provide symptomatic relief --CONTINUE Albuterol every 4-6 hours as needed for shortness of breath or wheezing --INCREASE home Omeprazole to 20 mg every 12 hours --We will refer you to Lung Cancer Screening clinic  For your smoking history: We discussed barriers to continued smoking and benefits of smoking cessation. Provided patient with information cessation techniques and interventions including [place] quitline. --Nicotine patches Patients smoking ?10 cigarettes/day: Begin with step 2 (14 mg/day) for 6 weeks, followed by step 3 (7 mg/day) for 2 weeks --Lozenges as directed  Follow-up in 6-8 weeks

## 2018-07-13 ENCOUNTER — Telehealth: Payer: Self-pay | Admitting: Pulmonary Disease

## 2018-07-13 NOTE — Telephone Encounter (Signed)
Called patient, unable to reach left message to give us a call back. 

## 2018-07-15 ENCOUNTER — Other Ambulatory Visit: Payer: Self-pay | Admitting: *Deleted

## 2018-07-15 ENCOUNTER — Telehealth: Payer: Self-pay | Admitting: Pulmonary Disease

## 2018-07-15 ENCOUNTER — Telehealth: Payer: Self-pay | Admitting: Acute Care

## 2018-07-15 DIAGNOSIS — Z122 Encounter for screening for malignant neoplasm of respiratory organs: Secondary | ICD-10-CM

## 2018-07-15 DIAGNOSIS — Z87891 Personal history of nicotine dependence: Secondary | ICD-10-CM

## 2018-07-15 DIAGNOSIS — F17210 Nicotine dependence, cigarettes, uncomplicated: Principal | ICD-10-CM

## 2018-07-15 MED ORDER — NYSTATIN 100000 UNIT/ML MT SUSP: 5.0000 mL | Freq: Four times a day (QID) | OROMUCOSAL | 0 refills | Status: DC

## 2018-07-15 MED ORDER — ATENOLOL 25 MG PO TABS: 25.0000 mg | ORAL_TABLET | Freq: Every day | ORAL | 0 refills | Status: DC

## 2018-07-15 NOTE — Telephone Encounter (Signed)
Spoke with pt, she states the inhalers are making her mouth sore. She is currently taking albuterol and Spiriva and one of her friends stated the magic mouthwash would help with the soreness. I asked her if she thought she had trush, she stated she didn't think she had thrush and that there wasn't a white coating on her tongue. Please advise if we can send this in for pt.   [place]   Current Outpatient Medications on File Prior to Visit  Medication Sig Dispense Refill  . albuterol (PROVENTIL HFA;VENTOLIN HFA) 108 (90 Base) MCG/ACT inhaler Inhale 2 puffs into the lungs every 6 (six) hours as needed for wheezing or shortness of breath. (Patient not taking: Reported on 07/08/2018) 1 Inhaler 5  . aspirin 81 MG tablet Take 81 mg by mouth daily.    Marland Kitchen atenolol (TENORMIN) 25 MG tablet Take 1 tablet (25 mg total) by mouth daily. 90 tablet 0  . benzonatate (TESSALON PERLES) 100 MG capsule Take 1 capsule (100 mg total) by mouth 3 (three) times daily as needed. 20 capsule 0  . budesonide (ENTOCORT EC) 3 MG 24 hr capsule Take 6 mg by mouth daily.    . cholecalciferol (VITAMIN D) 1000 UNITS tablet Take 1,000 Units by mouth daily.    . citalopram (CELEXA) 40 MG tablet Take 1 tablet (40 mg total) by mouth daily. (Needs to be seen) 90 tablet 3  . diclofenac (VOLTAREN) 75 MG EC tablet Take 75 mg by mouth daily.    . diphenoxylate-atropine (LOMOTIL) 2.5-0.025 MG tablet 1-2 tablets ac/hs prn 180 tablet 1  . gabapentin (NEURONTIN) 600 MG tablet TAKE 2 TABLETS BY MOUTH TWICE DAILY 120 tablet 2  . HYDROcodone-acetaminophen (NORCO/VICODIN) 5-325 MG per tablet Take 1 tablet by mouth every 6 (six) hours as needed. (Patient taking differently: Take 1 tablet by mouth every 6 (six) hours as needed for moderate pain. ) 60 tablet 0  . omeprazole (PRILOSEC) 20 MG capsule Take 1 capsule (20 mg total) by mouth daily. 90 capsule 0  . simvastatin (ZOCOR) 40 MG tablet Take 1 tablet (40 mg total) by mouth daily. 90 tablet 0   . sulfaSALAzine (AZULFIDINE) 500 MG tablet Take 1,000 mg by mouth 2 (two) times daily. Reported on 11/28/2015    . Tiotropium Bromide Monohydrate (SPIRIVA RESPIMAT) 2.5 MCG/ACT AERS Inhale 2 puffs into the lungs daily. (Patient not taking: Reported on 07/08/2018) 2 Inhaler 0  . Tiotropium Bromide Monohydrate (SPIRIVA RESPIMAT) 2.5 MCG/ACT AERS Inhale 2 puffs into the lungs daily. 1 Inhaler 3  . vitamin C (ASCORBIC ACID) 500 MG tablet Take 500 mg by mouth daily.     No current facility-administered medications on file prior to visit.    Allergies  Allergen Reactions  . Ciprofloxacin Hcl Rash

## 2018-07-15 NOTE — Telephone Encounter (Signed)
Called and spoke with patient, she is aware of response and verbalized understanding. Order has been placed. Nothing further needed.

## 2018-07-15 NOTE — Telephone Encounter (Signed)
Sorry the patient is not feeling well.  Patient needs to rinse and make sure she is spitting out after using the Spiriva.  Patient can present to our office for further evaluation and potentially switching of her inhalers if she would like.  Can offer patient nystatin rinse.   Nystatin 500,000 units suspension >>>5 mL's every 6 hours for 7 days  Please place the order  If patient is unwilling to take nystatin.  Patient can be scheduled for an office visit for further evaluation of her inhaler therapies.  Wyn Quaker FNP

## 2018-07-15 NOTE — Telephone Encounter (Signed)
Patient returned call, it was Langley Gauss who called her for lung cancer screening.  May close this message.  I put in a new message for [facility] for lung nodule pool.

## 2018-07-18 NOTE — Telephone Encounter (Signed)
Routing encounter to Denise. 

## 2018-07-19 NOTE — Telephone Encounter (Signed)
Spoke to pt and scheduled SDMV 08/08/18 3:00 CT ordered Nothing further needed

## 2018-07-19 NOTE — Telephone Encounter (Signed)
LMTC x 1  

## 2018-07-26 ENCOUNTER — Encounter: Payer: Self-pay | Admitting: Family Medicine

## 2018-07-26 ENCOUNTER — Ambulatory Visit (INDEPENDENT_AMBULATORY_CARE_PROVIDER_SITE_OTHER): Payer: Medicare Other | Admitting: Family Medicine

## 2018-07-26 VITALS — BP 112/69 | HR 106 | Temp 97.9°F | Ht 61.0 in | Wt 197.0 lb

## 2018-07-26 DIAGNOSIS — B370 Candidal stomatitis: Secondary | ICD-10-CM

## 2018-07-26 DIAGNOSIS — R5383 Other fatigue: Secondary | ICD-10-CM

## 2018-07-26 DIAGNOSIS — R05 Cough: Secondary | ICD-10-CM | POA: Diagnosis not present

## 2018-07-26 DIAGNOSIS — R053 Chronic cough: Secondary | ICD-10-CM

## 2018-07-26 MED ORDER — CLOTRIMAZOLE 10 MG MT TROC: 10.0000 mg | Freq: Every day | OROMUCOSAL | 0 refills | Status: AC

## 2018-07-26 MED ORDER — LIDOCAINE VISCOUS HCL 2 % MT SOLN: OROMUCOSAL | 0 refills | Status: DC

## 2018-07-26 MED ORDER — GUAIFENESIN-CODEINE 100-10 MG/5ML PO SOLN: 5.0000 mL | Freq: Four times a day (QID) | ORAL | 0 refills | Status: DC | PRN

## 2018-07-26 NOTE — Progress Notes (Signed)
Subjective: CC: oral pain PCP: [name], DO CNO:[id number] [name] is a 67 y.o. female presenting to clinic today for:  1. Oral pain Patient reports a greater than 1 week history of oral pain.  She notes her entire mouth and tongue are sore.  She said this started after she started using a new inhaler which contained corticosteroids.  She had not been rinsing her mouth in think she developed thrush.  She contacted her pulmonologist who prescribed her nystatin but she notes that she was only able to use this for about 4 days because it was causing her to feel nauseous.  She reports a sensation of extremely dry throat and fatigue.  Her cough is persistent as well and refractory to [employer].  She is currently scheduled to have a low-dose CT scan for lung cancer screening.  She had pulmonary function tests done with her pulmonologist recently.  No fevers.  No hemoptysis.  No change in breathing.   ROS: Per HPI  Allergies  Allergen Reactions  . Ciprofloxacin Hcl Rash   Past Medical History:  Diagnosis Date  . Anemia   . Arthritis    rheumatoid  . Depression   . Diabetes ([place])   . Diabetes mellitus without complication ([place])   . GERD (gastroesophageal reflux disease)   . Headache   . Hyperlipidemia   . Hyperplastic rectal polyp 10/21/2015  . Hypertension    pt. denies  . Lymphocytic colitis   . Shortness of breath dyspnea    with exertion    Current Outpatient Medications:  .  albuterol (PROVENTIL HFA;VENTOLIN HFA) 108 (90 Base) MCG/ACT inhaler, Inhale 2 puffs into the lungs every 6 (six) hours as needed for wheezing or shortness of breath. (Patient not taking: Reported on 07/08/2018), Disp: 1 Inhaler, Rfl: 5 .  aspirin 81 MG tablet, Take 81 mg by mouth daily., Disp: , Rfl:  .  atenolol (TENORMIN) 25 MG tablet, Take 1 tablet (25 mg total) by mouth daily., Disp: 90 tablet, Rfl: 0 .  benzonatate (TESSALON PERLES) 100 MG capsule, Take 1 capsule (100 mg total) by  mouth 3 (three) times daily as needed., Disp: 20 capsule, Rfl: 0 .  budesonide (ENTOCORT EC) 3 MG 24 hr capsule, Take 6 mg by mouth daily., Disp: , Rfl:  .  cholecalciferol (VITAMIN D) 1000 UNITS tablet, Take 1,000 Units by mouth daily., Disp: , Rfl:  .  citalopram (CELEXA) 40 MG tablet, Take 1 tablet (40 mg total) by mouth daily. (Needs to be seen), Disp: 90 tablet, Rfl: 3 .  diclofenac (VOLTAREN) 75 MG EC tablet, Take 75 mg by mouth daily., Disp: , Rfl:  .  diphenoxylate-atropine (LOMOTIL) 2.5-0.025 MG tablet, 1-2 tablets ac/hs prn, Disp: 180 tablet, Rfl: 1 .  gabapentin (NEURONTIN) 600 MG tablet, TAKE 2 TABLETS BY MOUTH TWICE DAILY, Disp: 120 tablet, Rfl: 2 .  HYDROcodone-acetaminophen (NORCO/VICODIN) 5-325 MG per tablet, Take 1 tablet by mouth every 6 (six) hours as needed. (Patient taking differently: Take 1 tablet by mouth every 6 (six) hours as needed for moderate pain. ), Disp: 60 tablet, Rfl: 0 .  nystatin (MYCOSTATIN) 100000 UNIT/ML suspension, Take 5 mLs (500,000 Units total) by mouth 4 (four) times daily., Disp: 60 mL, Rfl: 0 .  omeprazole (PRILOSEC) 20 MG capsule, Take 1 capsule (20 mg total) by mouth daily., Disp: 90 capsule, Rfl: 0 .  simvastatin (ZOCOR) 40 MG tablet, Take 1 tablet (40 mg total) by mouth daily., Disp: 90 tablet, Rfl: 0 .  sulfaSALAzine (AZULFIDINE) 500 MG tablet, Take 1,000 mg by mouth 2 (two) times daily. Reported on 11/28/2015, Disp: , Rfl:  .  Tiotropium Bromide Monohydrate (SPIRIVA RESPIMAT) 2.5 MCG/ACT AERS, Inhale 2 puffs into the lungs daily. (Patient not taking: Reported on 07/08/2018), Disp: 2 Inhaler, Rfl: 0 .  Tiotropium Bromide Monohydrate (SPIRIVA RESPIMAT) 2.5 MCG/ACT AERS, Inhale 2 puffs into the lungs daily., Disp: 1 Inhaler, Rfl: 3 .  vitamin C (ASCORBIC ACID) 500 MG tablet, Take 500 mg by mouth daily., Disp: , Rfl:  Social History   Socioeconomic History  . Marital status: Legally Separated    Spouse name: Not on file  . Number of children: 1  .  Years of education: Not on file  . Highest education level: Not on file  Occupational History  . Occupation: taking care of parents  Social Needs  . Financial resource strain: Not on file  . Food insecurity:    Worry: Not on file    Inability: Not on file  . Transportation needs:    Medical: Not on file    Non-medical: Not on file  Tobacco Use  . Smoking status: Current Every Day Smoker    Packs/day: 0.50    Years: 36.00    Pack years: 18.00    Types: Cigarettes  . Smokeless tobacco: Current User  . Tobacco comment: less than a pack daily  Substance and Sexual Activity  . Alcohol use: No    Alcohol/week: 0.0 standard drinks  . Drug use: No  . Sexual activity: Not Currently  Lifestyle  . Physical activity:    Days per week: Not on file    Minutes per session: Not on file  . Stress: Not on file  Relationships  . Social connections:    Talks on phone: Not on file    Gets together: Not on file    Attends religious service: Not on file    Active member of club or organization: Not on file    Attends meetings of clubs or organizations: Not on file    Relationship status: Not on file  . Intimate partner violence:    Fear of current or ex partner: Not on file    Emotionally abused: Not on file    Physically abused: Not on file    Forced sexual activity: Not on file  Other Topics Concern  . Not on file  Social History Narrative   The patient is divorced she has 1 sonWho is married that she has a grandchild.   She is not employed though the chart indicates she has cared for her parents   She does not use alcohol or drugs she drinks 3-4 caffeinated beverages daily   She is a smoker   Family History  Problem Relation Age of Onset  . Alzheimer's disease Mother   . Lung cancer Mother   . Diabetes Father   . Colon cancer Neg Hx   . Stomach cancer Neg Hx     Objective: Office vital signs reviewed. BP 112/69   Pulse (!) 106   Temp 97.9 F (36.6 C) (Oral)   Ht 5' 1"   (1.549 m)   Wt 197 lb (89.4 kg)   BMI 37.22 kg/m   Physical Examination:  General: Awake, alert, nontoxic, No acute distress HEENT: Normal    Throat: tacky mucus membranes, moderate erythema and inflammation.  She has scant white plaques along bilateral buccal mucosa and of the back of her tongue. Cardio: regular rate and rhythm, S1S2  heard, no murmurs appreciated Pulm: clear to auscultation bilaterally, no wheezes, rhonchi or rales; normal work of breathing on room air; intermittently coughing during exam.  Assessment/ Plan: 67 y.o. female   1. Oropharyngeal candidiasis Start clotrimazole troches 5 times daily for the next 14 days.  Viscous Lidocaine given to swish and spit every 4 hours if needed for oral pain.  Home care instructions reviewed and handout was provided.  Follow-up PRN.  2. Other fatigue Possibly related to dehydration.  Mucous membranes were tacky.  Will check renal panel and CBC. - CMP14+EGFR - CBC  3. Chronic cough Chronic issue for patient.  She is currently under the care of pulmonology.  She is afebrile and nontoxic-appearing on today's exam.  Lung exam was unremarkable except for intermittent coughing during exam.  Per her report, symptoms are refractory to Tessalon Perles and inhalers.  She requested cough syrup.  I have prescribed her Robitussin-AC and discussed that this cannot be used in conjunction with Norco.  She gave a verbal contract that she would not use them in the same day.  The national narcotic database was reviewed and did demonstrate a November fill of Norco.  No red flags noted.  Follow-up PRN.  No orders of the defined types were placed in this encounter.  Meds ordered this encounter  Medications  . guaiFENesin-codeine 100-10 MG/5ML syrup    Sig: Take 5 mLs by mouth every 6 (six) hours as needed for cough.    Dispense:  100 mL    Refill:  0  . clotrimazole (MYCELEX) 10 MG troche    Sig: Take 1 tablet (10 mg total) by mouth 5 (five) times  daily for 14 days.    Dispense:  70 tablet    Refill:  0  . lidocaine (XYLOCAINE) 2 % solution    Sig: Swish and spit 71m every 4 hours as needed for oral pain    Dispense:  100 mL    Refill:  0      MWindell Moulding DO [facility]([phone]

## 2018-07-26 NOTE — Patient Instructions (Signed)
Oral Thrush, Adult Oral thrush, also called oral candidiasis, is a fungal infection that develops in the mouth and throat and on the tongue. It causes white patches to form on the mouth and tongue. Thrush is most common in older adults, but it can occur at any age. Many cases of thrush are mild, but this infection can also be serious. Thrush can be a repeated (recurrent) problem for certain people who have a weak body defense system (immune system). The weakness can be caused by chronic illnesses, or by taking medicines that limit the body's ability to fight infection. If a person has difficulty fighting infection, the fungus that causes thrush can spread through the body. This can cause life-threatening blood or organ infections. What are the causes? This condition is caused by a fungus (yeast) called Candida albicans.  This fungus is normally present in small amounts in the mouth and on other mucous membranes. It usually causes no harm.  If conditions are present that allow the fungus to grow without control, it invades surrounding tissues and becomes an infection.  Other Candida species can also lead to thrush (rare).  What increases the risk? This condition is more likely to develop in:  People with a weakened immune system.  Older adults.  People with HIV (human immunodeficiency virus).  People with diabetes.  People with dry mouth (xerostomia).  Pregnant women.  People with poor dental care, especially people who have false teeth.  People who use antibiotic medicines.  What are the signs or symptoms? Symptoms of this condition can vary from mild and moderate to severe and persistent. Symptoms may include:  A burning feeling in the mouth and throat. This can occur at the start of a thrush infection.  White patches that stick to the mouth and tongue. The tissue around the patches may be red, raw, and painful. If rubbed (during tooth brushing, for example), the patches and the  tissue of the mouth may bleed easily.  A bad taste in the mouth or difficulty tasting foods.  A cottony feeling in the mouth.  Pain during eating and swallowing.  Poor appetite.  Cracking at the corners of the mouth.  How is this diagnosed? This condition is diagnosed based on:  Physical exam. Your health care provider will look in your mouth.  Health history. Your health care provider will ask you questions about your health.  How is this treated? This condition is treated with medicines called antifungals, which prevent the growth of fungi. These medicines are either applied directly to the affected area (topical) or swallowed (oral). The treatment will depend on the severity of the condition. Mild thrush Mild cases of thrush may clear up with the use of an antifungal mouth rinse or lozenges. Treatment usually lasts about 14 days. Moderate to severe thrush  More severe thrush infections that have spread to the esophagus are treated with an oral antifungal medicine. A topical antifungal medicine may also be used.  For some severe infections, treatment may need to continue for more than 14 days.  Oral antifungal medicines are rarely used during pregnancy because they may be harmful to the unborn child. If you are pregnant, talk with your health care provider about options for treatment. Persistent or recurrent thrush For cases of thrush that do not go away or keep coming back:  Treatment may be needed twice as long as the symptoms last.  Treatment will include both oral and topical antifungal medicines.  People with a weakened immune   system can take an antifungal medicine on a continuous basis to prevent thrush infections.  It is important to treat conditions that make a person more likely to get thrush, such as diabetes or HIV. Follow these instructions at home: Medicines  Take over-the-counter and prescription medicines only as told by your health care provider.  Talk  with your health care provider about an over-the-counter medicine called gentian violet, which kills bacteria and fungi. Relieving soreness and discomfort To help reduce the discomfort of thrush:  Drink cold liquids such as water or iced tea.  Try flavored ice treats or frozen juices.  Eat foods that are easy to swallow, such as gelatin, ice cream, or custard.  Try drinking from a straw if the patches in your mouth are painful.  General instructions  Eat plain, unflavored yogurt as directed by your health care provider. Check the label to make sure the yogurt contains live cultures. This yogurt can help healthy bacteria to grow in the mouth and can stop the growth of the fungus that causes thrush.  If you wear dentures, remove the dentures before going to bed, brush them vigorously, and soak them in a cleaning solution as directed by your health care provider.  Rinse your mouth with a warm salt-water mixture several times a day. To make a salt-water mixture, completely dissolve 1/2-1 tsp of salt in 1 cup of warm water. Contact a health care provider if:  Your symptoms are getting worse or are not improving within 7 days of starting treatment.  You have symptoms of a spreading infection, such as white patches on the skin outside of the mouth. This information is not intended to replace advice given to you by your health care provider. Make sure you discuss any questions you have with your health care provider. Document Released: 04/28/2004 Document Revised: 04/27/2016 Document Reviewed: 04/27/2016 [employer] Interactive Patient Education  2017 [employer]  

## 2018-07-27 ENCOUNTER — Telehealth: Payer: Self-pay | Admitting: Family Medicine

## 2018-07-27 ENCOUNTER — Encounter (HOSPITAL_COMMUNITY): Payer: Self-pay | Admitting: Emergency Medicine

## 2018-07-27 ENCOUNTER — Other Ambulatory Visit: Payer: Self-pay

## 2018-07-27 ENCOUNTER — Emergency Department (HOSPITAL_COMMUNITY)
Admission: EM | Admit: 2018-07-27 | Discharge: 2018-07-27 | Disposition: A | Discharge status: Home / Self Care | Payer: Medicare Other | Attending: Emergency Medicine | Admitting: Emergency Medicine

## 2018-07-27 ENCOUNTER — Emergency Department (HOSPITAL_COMMUNITY): Payer: Medicare Other

## 2018-07-27 DIAGNOSIS — I10 Essential (primary) hypertension: Secondary | ICD-10-CM | POA: Diagnosis not present

## 2018-07-27 DIAGNOSIS — F17210 Nicotine dependence, cigarettes, uncomplicated: Secondary | ICD-10-CM | POA: Diagnosis not present

## 2018-07-27 DIAGNOSIS — Z7982 Long term (current) use of aspirin: Secondary | ICD-10-CM | POA: Insufficient documentation

## 2018-07-27 DIAGNOSIS — E1165 Type 2 diabetes mellitus with hyperglycemia: Secondary | ICD-10-CM | POA: Diagnosis not present

## 2018-07-27 DIAGNOSIS — R739 Hyperglycemia, unspecified: Secondary | ICD-10-CM

## 2018-07-27 DIAGNOSIS — Z79899 Other long term (current) drug therapy: Secondary | ICD-10-CM | POA: Diagnosis not present

## 2018-07-27 DIAGNOSIS — R05 Cough: Secondary | ICD-10-CM | POA: Diagnosis not present

## 2018-07-27 LAB — CBC WITH DIFFERENTIAL/PLATELET
Abs Immature Granulocytes: 0.04 10*3/uL (ref 0.00–0.07)
Basophils Absolute: 0.1 10*3/uL (ref 0.0–0.1)
Basophils Relative: 1 %
Eosinophils Absolute: 0.2 10*3/uL (ref 0.0–0.5)
Eosinophils Relative: 2 %
HCT: 45.1 % (ref 36.0–46.0)
Hemoglobin: 15.7 g/dL — ABNORMAL HIGH (ref 12.0–15.0)
Immature Granulocytes: 0 %
Lymphocytes Relative: 26 %
Lymphs Abs: 2.7 10*3/uL (ref 0.7–4.0)
MCH: 28.9 pg (ref 26.0–34.0)
MCHC: 34.8 g/dL (ref 30.0–36.0)
MCV: 83.1 fL (ref 80.0–100.0)
MONOS PCT: 8 %
Monocytes Absolute: 0.8 10*3/uL (ref 0.1–1.0)
Neutro Abs: 6.8 10*3/uL (ref 1.7–7.7)
Neutrophils Relative %: 63 %
Platelets: 245 10*3/uL (ref 150–400)
RBC: 5.43 MIL/uL — ABNORMAL HIGH (ref 3.87–5.11)
RDW: 11.8 % (ref 11.5–15.5)
WBC: 10.6 10*3/uL — ABNORMAL HIGH (ref 4.0–10.5)
nRBC: 0 % (ref 0.0–0.2)

## 2018-07-27 LAB — BLOOD GAS, VENOUS
Acid-Base Excess: 4.9 mmol/L — ABNORMAL HIGH (ref 0.0–2.0)
Bicarbonate: 27 mmol/L (ref 20.0–28.0)
Drawn by: 181601
FIO2: 21
O2 Saturation: 61.6 %
Patient temperature: 37
pCO2, Ven: 47.6 mmHg (ref 44.0–60.0)
pH, Ven: 7.407 (ref 7.250–7.430)
pO2, Ven: 32.8 mmHg (ref 32.0–45.0)

## 2018-07-27 LAB — URINALYSIS, ROUTINE W REFLEX MICROSCOPIC
Bacteria, UA: NONE SEEN
Bilirubin Urine: NEGATIVE
Glucose, UA: >=500 mg/dL — AB
Hgb urine dipstick: NEGATIVE
Ketones, ur: 5 mg/dL — AB
Leukocytes, UA: NEGATIVE
Nitrite: NEGATIVE
PROTEIN: NEGATIVE mg/dL
Specific Gravity, Urine: 1.027 (ref 1.005–1.030)
pH: 6 (ref 5.0–8.0)

## 2018-07-27 LAB — CMP14+EGFR
ALT: 48 IU/L — ABNORMAL HIGH (ref 0–32)
AST: 36 IU/L (ref 0–40)
Albumin/Globulin Ratio: 1.5 (ref 1.2–2.2)
Albumin: 4.4 g/dL (ref 3.6–4.8)
Alkaline Phosphatase: 142 IU/L — ABNORMAL HIGH (ref 39–117)
BUN/Creatinine Ratio: 15 (ref 12–28)
BUN: 19 mg/dL (ref 8–27)
Bilirubin Total: 0.6 mg/dL (ref 0.0–1.2)
CO2: 24 mmol/L (ref 20–29)
Calcium: 9.9 mg/dL (ref 8.7–10.3)
Chloride: 75 mmol/L — ABNORMAL LOW (ref 96–106)
Creatinine, Ser: 1.26 mg/dL — ABNORMAL HIGH (ref 0.57–1.00)
GFR calc Af Amer: 52 mL/min/{1.73_m2} — ABNORMAL LOW (ref >59)
GFR calc non Af Amer: 45 mL/min/{1.73_m2} — ABNORMAL LOW (ref >59)
Globulin, Total: 3 g/dL (ref 1.5–4.5)
Glucose: 726 mg/dL (ref 65–99)
Potassium: 4 mmol/L (ref 3.5–5.2)
SODIUM: 122 mmol/L — AB (ref 134–144)
Total Protein: 7.4 g/dL (ref 6.0–8.5)

## 2018-07-27 LAB — COMPREHENSIVE METABOLIC PANEL
ALT: 66 U/L — ABNORMAL HIGH (ref 0–44)
ANION GAP: 15 (ref 5–15)
AST: 72 U/L — ABNORMAL HIGH (ref 15–41)
Albumin: 4.5 g/dL (ref 3.5–5.0)
Alkaline Phosphatase: 124 U/L (ref 38–126)
BUN: 19 mg/dL (ref 8–23)
CO2: 27 mmol/L (ref 22–32)
Calcium: 9.3 mg/dL (ref 8.9–10.3)
Chloride: 80 mmol/L — ABNORMAL LOW (ref 98–111)
Creatinine, Ser: 1.08 mg/dL — ABNORMAL HIGH (ref 0.44–1.00)
GFR calc Af Amer: >60 mL/min (ref >60)
GFR calc non Af Amer: 54 mL/min — ABNORMAL LOW (ref >60)
Glucose, Bld: 585 mg/dL (ref 70–99)
Potassium: 3.7 mmol/L (ref 3.5–5.1)
Sodium: 122 mmol/L — ABNORMAL LOW (ref 135–145)
Total Bilirubin: 1.1 mg/dL (ref 0.3–1.2)
Total Protein: 7.9 g/dL (ref 6.5–8.1)

## 2018-07-27 LAB — CBC
Hematocrit: 44.9 % (ref 34.0–46.6)
Hemoglobin: 15.4 g/dL (ref 11.1–15.9)
MCH: 30.7 pg (ref 26.6–33.0)
MCHC: 34.3 g/dL (ref 31.5–35.7)
MCV: 89 fL (ref 79–97)
PLATELETS: 229 10*3/uL (ref 150–450)
RBC: 5.02 x10E6/uL (ref 3.77–5.28)
RDW: 11.7 % — ABNORMAL LOW (ref 12.3–15.4)
WBC: 10.2 10*3/uL (ref 3.4–10.8)

## 2018-07-27 LAB — CBG MONITORING, ED: Glucose-Capillary: 358 mg/dL — ABNORMAL HIGH (ref 70–99)

## 2018-07-27 MED ORDER — ONDANSETRON HCL 4 MG/2ML IJ SOLN
4.0000 mg | Freq: Once | INTRAMUSCULAR | Status: AC
  Administered 2018-07-27: 4 mg via INTRAVENOUS
  Filled 2018-07-27: qty 2

## 2018-07-27 MED ORDER — INSULIN ASPART 100 UNIT/ML ~~LOC~~ SOLN
6.0000 [IU] | Freq: Once | SUBCUTANEOUS | Status: AC
  Administered 2018-07-27: 6 [IU] via SUBCUTANEOUS
  Filled 2018-07-27: qty 1

## 2018-07-27 MED ORDER — SODIUM CHLORIDE 0.9 % IV BOLUS
1000.0000 mL | Freq: Once | INTRAVENOUS | Status: AC
  Administered 2018-07-27 (×2): 1000 mL via INTRAVENOUS

## 2018-07-27 MED ORDER — SODIUM CHLORIDE 0.9 % IV BOLUS
1000.0000 mL | Freq: Once | INTRAVENOUS | Status: AC
  Administered 2018-07-27: 1000 mL via INTRAVENOUS

## 2018-07-27 MED ORDER — GLIPIZIDE 5 MG PO TABS: 5.0000 mg | ORAL_TABLET | Freq: Every day | ORAL | 0 refills | Status: DC

## 2018-07-27 NOTE — ED Provider Notes (Signed)
[facility] EMERGENCY DEPARTMENT Provider Note   CSN: [id number] Arrival date & time: 07/27/18  1158     History   Chief Complaint Chief Complaint  Patient presents with  . Abnormal Lab    HPI Donna Rogers is a 67 y.o. female.  She was sent in by her primary care doctor for evaluation of high blood sugar.  She said she has been sick for about a week with pain in her mouth and feeling fatigued.  She said she vomits usually once or twice a day.  She has been urinating very frequently and she has some blurry vision.  She went to her PCPs office where they noted that she had thrush and they sent off some basic blood work.  Today she got a call that her blood sugar was over 700 and they wanted her to come to the ER.  As far as the pain in the mouth she relates it to a new steroid inhaler that her pulmonologist gave her and did not tell her she needed to rinse her mouth after using.  She denies any fever no chest pain no abdominal pain no rashes or swollen joints.  Her usual blood sugars are 120-140.  The history is provided by the patient.  Hyperglycemia  Blood sugar level PTA:  700 Severity:  Severe Onset quality:  Unable to specify Duration:  1 week Timing:  Unable to specify Progression:  Unchanged Chronicity:  New Diabetes status:  Controlled with diet Current diabetic therapy:  Was on glipizide but taken off few months ago for low sugars Relieved by:  Nothing Ineffective treatments:  None tried Associated symptoms: blurred vision, fatigue, malaise, nausea, polyuria, vomiting and weakness   Associated symptoms: no abdominal pain, no altered mental status, no chest pain, no confusion, no dysuria, no fever, no shortness of breath and no syncope     Past Medical History:  Diagnosis Date  . Anemia   . Arthritis    rheumatoid  . Depression   . Diabetes ([place])   . Diabetes mellitus without complication ([place])   . GERD (gastroesophageal reflux disease)   . Headache   .  Hyperlipidemia   . Hyperplastic rectal polyp 10/21/2015  . Hypertension    pt. denies  . Lymphocytic colitis   . Shortness of breath dyspnea    with exertion    Patient Active Problem List   Diagnosis Date Noted  . Tobacco abuse 04/10/2016  . Lymphocytic colitis 12/03/2015  . DM2 (diabetes mellitus, type 2) ([place]) 10/11/2015  . Depression 06/17/2015  . Fatigue 06/17/2015  . Weakness 06/03/2015  . Hypertension associated with diabetes ([place])   . OBESITY 01/21/2009  . EROSIVE ESOPHAGITIS 01/21/2009  . GASTROESOPHAGEAL REFLUX DISEASE, CHRONIC 01/21/2009  . ARTHRITIS, RHEUMATOID 01/21/2009  . TENDINITIS 01/21/2009  . EDEMA 01/21/2009    Past Surgical History:  Procedure Laterality Date  . BREAST LUMPECTOMY Right 2018  . BREAST LUMPECTOMY WITH RADIOACTIVE SEED LOCALIZATION Left 03/03/2016   Procedure: BREAST LUMPECTOMY WITH RADIOACTIVE SEED LOCALIZATION;  Surgeon: Coralie Keens, MD;  Location: [facility];  Service: General;  Laterality: Left;  . COLONOSCOPY  08/2002   RMR: internal hemorrhoids  . COLONOSCOPY N/A 10/21/2015   Procedure: COLONOSCOPY;  Surgeon: Daneil Dolin, MD;  Location: [facility];  Service: Endoscopy;  Laterality: N/A;  250 - moved to 2:35 - office to notify pt  . CYST REMOVAL TRUNK    . ESOPHAGOGASTRODUODENOSCOPY  08/2002   RMR: nonerosive reflux esophagitis, hiatal hernia  .  TONSILLECTOMY       OB History    Gravida  1   Para  1   Term  1   Preterm      AB      Living  1     SAB      TAB      Ectopic      Multiple      Live Births               Home Medications    Prior to Admission medications   Medication Sig Start Date End Date Taking? Authorizing Provider  albuterol (PROVENTIL HFA;VENTOLIN HFA) 108 (90 Base) MCG/ACT inhaler Inhale 2 puffs into the lungs every 6 (six) hours as needed for wheezing or shortness of breath. 06/01/18  Yes Margaretha Seeds, MD  aspirin 81 MG tablet Take 81 mg by mouth daily.   Yes [provider]  atenolol (TENORMIN) 25 MG tablet Take 1 tablet (25 mg total) by mouth daily. 07/15/18  Yes Ronnie Doss M, DO  cholecalciferol (VITAMIN D) 1000 UNITS tablet Take 1,000 Units by mouth daily.   Yes [provider]  citalopram (CELEXA) 40 MG tablet Take 1 tablet (40 mg total) by mouth daily. (Needs to be seen) 05/16/18  Yes Ronnie Doss M, DO  clotrimazole (MYCELEX) 10 MG troche Take 1 tablet (10 mg total) by mouth 5 (five) times daily for 14 days. 07/26/18 08/09/18 Yes Gottschalk, Ashly M, DO  gabapentin (NEURONTIN) 600 MG tablet TAKE 2 TABLETS BY MOUTH TWICE DAILY 05/16/18  Yes Gottschalk, Ashly M, DO  guaiFENesin-codeine 100-10 MG/5ML syrup Take 5 mLs by mouth every 6 (six) hours as needed for cough. 07/26/18  Yes Ronnie Doss M, DO  HYDROcodone-acetaminophen (NORCO/VICODIN) 5-325 MG per tablet Take 1 tablet by mouth every 6 (six) hours as needed. Patient taking differently: Take 1 tablet by mouth every 6 (six) hours as needed for moderate pain.  07/19/14  Yes Lysbeth Penner, FNP  lidocaine (XYLOCAINE) 2 % solution Swish and spit 2mL every 4 hours as needed for oral pain 07/26/18  Yes Ronnie Doss M, DO  nystatin (MYCOSTATIN) 100000 UNIT/ML suspension Take 5 mLs (500,000 Units total) by mouth 4 (four) times daily. 07/15/18  Yes Lauraine Rinne, NP  omeprazole (PRILOSEC) 20 MG capsule Take 1 capsule (20 mg total) by mouth daily. 05/16/18  Yes Gottschalk, Ashly M, DO  simvastatin (ZOCOR) 40 MG tablet Take 1 tablet (40 mg total) by mouth daily. 05/16/18  Yes Gottschalk, Leatrice Jewels M, DO  Tiotropium Bromide Monohydrate (SPIRIVA RESPIMAT) 2.5 MCG/ACT AERS Inhale 2 puffs into the lungs daily. 06/01/18  Yes Margaretha Seeds, MD  benzonatate (TESSALON PERLES) 100 MG capsule Take 1 capsule (100 mg total) by mouth 3 (three) times daily as needed. 05/16/18   Ronnie Doss M, DO  budesonide (ENTOCORT EC) 3 MG 24 hr capsule Take 6 mg by mouth daily.    [provider]  diclofenac (VOLTAREN) 75 MG EC tablet Take 75 mg by mouth daily.    [provider]  diphenoxylate-atropine (LOMOTIL) 2.5-0.025 MG tablet 1-2 tablets ac/hs prn 01/18/18   Gatha Mayer, MD  sulfaSALAzine (AZULFIDINE) 500 MG tablet Take 1,000 mg by mouth 2 (two) times daily. Reported on 11/28/2015    [provider]  Tiotropium Bromide Monohydrate (SPIRIVA RESPIMAT) 2.5 MCG/ACT AERS Inhale 2 puffs into the lungs daily. 07/08/18   Margaretha Seeds, MD  vitamin C (ASCORBIC ACID) 500 MG tablet  Take 500 mg by mouth daily.    [provider]    Family History Family History  Problem Relation Age of Onset  . Alzheimer's disease Mother   . Lung cancer Mother   . Diabetes Father   . Colon cancer Neg Hx   . Stomach cancer Neg Hx     Social History Social History   Tobacco Use  . Smoking status: Current Every Day Smoker    Packs/day: 0.50    Years: 36.00    Pack years: 18.00    Types: Cigarettes  . Smokeless tobacco: Current User  . Tobacco comment: less than a pack daily  Substance Use Topics  . Alcohol use: No    Alcohol/week: 0.0 standard drinks  . Drug use: No     Allergies   Ciprofloxacin hcl   Review of Systems Review of Systems  Constitutional: Positive for fatigue. Negative for fever.  HENT: Positive for sore throat.   Eyes: Positive for blurred vision and visual disturbance.  Respiratory: Negative for shortness of breath.   Cardiovascular: Negative for chest pain and syncope.  Gastrointestinal: Positive for nausea and vomiting. Negative for abdominal pain.  Endocrine: Positive for polyuria.  Genitourinary: Negative for dysuria.  Musculoskeletal: Negative for back pain.  Skin: Negative for rash.  Neurological: Positive for weakness.  Psychiatric/Behavioral: Negative for confusion.     Physical Exam Updated Vital Signs BP 106/67   Pulse 100   Temp 97.7 F (36.5 C) (Oral)   Resp (!) 24   Ht 5\' 1"  (1.549 m)   Wt 89.4 kg   SpO2  99%   BMI 37.22 kg/m   Physical Exam  Constitutional: She is oriented to person, place, and time. She appears well-developed and well-nourished. No distress.  HENT:  Head: Normocephalic and atraumatic.  Some white plaques in mouth  Eyes: Conjunctivae are normal.  Neck: Neck supple.  Cardiovascular: Normal rate, regular rhythm and normal heart sounds.  No murmur heard. Pulmonary/Chest: Effort normal and breath sounds normal. No respiratory distress.  Abdominal: Soft. There is no tenderness.  Musculoskeletal: She exhibits no edema, tenderness or deformity.  Neurological: She is alert and oriented to person, place, and time.  Skin: Skin is warm and dry. Capillary refill takes less than 2 seconds.  Psychiatric: She has a normal mood and affect.  Nursing note and vitals reviewed.    ED Treatments / Results  Labs (all labs ordered are listed, but only abnormal results are displayed) Labs Reviewed  CBC WITH DIFFERENTIAL/PLATELET - Abnormal; Notable for the following components:      Result Value   WBC 10.6 (*)    RBC 5.43 (*)    Hemoglobin 15.7 (*)    All other components within normal limits  COMPREHENSIVE METABOLIC PANEL - Abnormal; Notable for the following components:   Sodium 122 (*)    Chloride 80 (*)    Glucose, Bld 585 (*)    Creatinine, Ser 1.08 (*)    AST 72 (*)    ALT 66 (*)    GFR calc non Af Amer 54 (*)    All other components within normal limits  URINALYSIS, ROUTINE W REFLEX MICROSCOPIC - Abnormal; Notable for the following components:   Glucose, UA >=500 (*)    Ketones, ur 5 (*)    All other components within normal limits  BLOOD GAS, VENOUS - Abnormal; Notable for the following components:   Acid-Base Excess 4.9 (*)    All other components within normal limits  CBG MONITORING, ED - Abnormal; Notable for the following components:   Glucose-Capillary >600 (*)    All other components within normal limits  CBG MONITORING, ED - Abnormal; Notable for the  following components:   Glucose-Capillary 358 (*)    All other components within normal limits    EKG None  Radiology Dg Chest 2 View  Result Date: 07/27/2018 CLINICAL DATA:  Cough and weakness. Hyperglycemia. History of COPD, current smoker. EXAM: CHEST - 2 VIEW COMPARISON:  PA and lateral chest x-ray dated May 16, 2018 FINDINGS: Today's frontal view is obtained in a lordotic projection. There is no focal infiltrate. The heart is top-normal in size. The pulmonary vascularity is not engorged. There is no pleural effusion. The bony thorax exhibits multilevel degenerative disc disease. IMPRESSION: Chronic bronchitic changes, stable. No acute cardiopulmonary abnormality. Electronically Signed   By: David  Martinique M.D.   On: 07/27/2018 13:20    Procedures Procedures (including critical care time)  Medications Ordered in ED Medications  sodium chloride 0.9 % bolus 1,000 mL (0 mLs Intravenous Stopped 07/27/18 1438)  sodium chloride 0.9 % bolus 1,000 mL (0 mLs Intravenous Stopped 07/27/18 1439)  ondansetron (ZOFRAN) injection 4 mg (4 mg Intravenous Given 07/27/18 1250)  insulin aspart (novoLOG) injection 6 Units (6 Units Subcutaneous Given 07/27/18 1440)     Initial Impression / Assessment and Plan / ED Course  I have reviewed the triage vital signs and the nursing notes.  Pertinent labs & imaging results that were available during my care of the patient were reviewed by me and considered in my medical decision making (see chart for details).  Clinical Course as of Jul 27 2153  Wed Jul 27, 2018  1421 Patient's chemistries are come back and she has a glucose of 585 and a pseudo-natremia of 122.  Her renal function is better than yesterday.   [MB]  1521 No obvious infectious trigger to explain why the patient sugars have gotten so elevated.  She had been on an oral medication but that stopped a few months ago so possibly she just slowly increased and has not been following her blood  sugars until she was finally symptomatic.  I think ultimately she will need to be restarted on some oral medications but likely can be discharged.   [MB]  8675 Patient is asking for something to eat and drink now feeling better.   [MB]    Clinical Course User Index [MB] Hayden Rasmussen, MD     Final Clinical Impressions(s) / ED Diagnoses   Final diagnoses:  Hyperglycemia    ED Discharge Orders         Ordered    glipiZIDE (GLUCOTROL) 5 MG tablet  Daily before breakfast     07/27/18 1551           Hayden Rasmussen, MD 07/27/18 2155

## 2018-07-27 NOTE — ED Notes (Signed)
Pt was placed on cardiac monitor. Oxygen noted to be 86% on RA, did not improve with slow deep breaths. Placed on 2L nasal cannula. Sats increased to 95%.

## 2018-07-27 NOTE — ED Triage Notes (Signed)
Patient states she was sent to ER for possible DKA. States she had labs drawn yesterday and glucose was over 700. Also complaining of vomiting x 1 week.

## 2018-07-27 NOTE — ED Notes (Signed)
CRITICAL VALUE ALERT  Critical Value:  Glucose 585  Date & Time Notied:  07/27/18 @ 1833  Provider Notified: Dr Melina Copa  Orders Received/Actions taken: see orders.

## 2018-07-27 NOTE — ED Notes (Signed)
RT at bedside to collect VBG.

## 2018-07-27 NOTE — ED Notes (Signed)
Pt aware a urine specimen was needed. Pt will notify staff when one can be obtained.

## 2018-07-27 NOTE — ED Notes (Signed)
Patient transported to X-ray 

## 2018-07-27 NOTE — Discharge Instructions (Addendum)
You were seen in the emergency department for a few weeks of fatigue and blurry vision and were found to have a very elevated blood sugar.  Your symptoms and your sugar improved with some IV fluids and some insulin.  We are restarting you back on an oral diabetes medication.  It will be important for you to let your doctor know about the medication and arrange follow-up.  Please return if any worsening symptoms or concerns.

## 2018-07-27 NOTE — Telephone Encounter (Signed)
Advised pt she should go to ED for evaluation per result notes from DR Lajuana Ripple and pt voiced understanding.   Patient has an acute kidney injury likely related to substantially elevated glucose. Her corrected sodium is 132. I suspect that her fatigue is likely related to substantially elevated glucose. She needs evaluation emergency department. I worry about HHS or DKA in this patient. She will certainly need medications for diabetes.

## 2018-08-01 ENCOUNTER — Telehealth: Payer: Self-pay | Admitting: Family Medicine

## 2018-08-01 NOTE — Telephone Encounter (Signed)
Daily is fine for now.  I want her to be watching her BGs and follow up with me to check a1c.  We will likely need to increase to twice daily but I do not want to drop her sugar, this was a problem before which is why Wendi Snipes took her off of medication.

## 2018-08-01 NOTE — Telephone Encounter (Signed)
Pt aware of recommendations and states her fasting Blood Sugar this morning was 343. Pt states she has been drinking water but adding crystal light to it and black coffee. Pt states she is not eating any carbs but can't figure out why her blood sugars are so out of control. Scheduled with Gottschalk 12/18 at 1:15.

## 2018-08-01 NOTE — Telephone Encounter (Signed)
Pt states that Dr Wendi Snipes had her taking glipiZIDE (GLUCOTROL) 5 MG tablet one tablet twice a day, but the ER doctor prescribed her to take it one tablet once a day, can we re write the rx and send to [facility].

## 2018-08-01 NOTE — Telephone Encounter (Signed)
Did you want pt on Glipizide twice daily or once daily? It does look like she had been on twice daily from the history in her meds?

## 2018-08-03 ENCOUNTER — Ambulatory Visit (INDEPENDENT_AMBULATORY_CARE_PROVIDER_SITE_OTHER): Payer: Medicare Other | Admitting: Family Medicine

## 2018-08-03 VITALS — BP 129/72 | HR 90 | Temp 97.4°F | Ht 61.0 in | Wt 199.0 lb

## 2018-08-03 DIAGNOSIS — E1165 Type 2 diabetes mellitus with hyperglycemia: Secondary | ICD-10-CM

## 2018-08-03 DIAGNOSIS — D72829 Elevated white blood cell count, unspecified: Secondary | ICD-10-CM

## 2018-08-03 DIAGNOSIS — R945 Abnormal results of liver function studies: Secondary | ICD-10-CM | POA: Diagnosis not present

## 2018-08-03 DIAGNOSIS — Z114 Encounter for screening for human immunodeficiency virus [HIV]: Secondary | ICD-10-CM | POA: Diagnosis not present

## 2018-08-03 DIAGNOSIS — R7989 Other specified abnormal findings of blood chemistry: Secondary | ICD-10-CM

## 2018-08-03 DIAGNOSIS — R413 Other amnesia: Secondary | ICD-10-CM

## 2018-08-03 DIAGNOSIS — Z1159 Encounter for screening for other viral diseases: Secondary | ICD-10-CM

## 2018-08-03 LAB — BAYER DCA HB A1C WAIVED: HB A1C (BAYER DCA - WAIVED): 13.8 % — ABNORMAL HIGH (ref <7.0)

## 2018-08-03 MED ORDER — GLIPIZIDE 5 MG PO TABS: 5.0000 mg | ORAL_TABLET | Freq: Two times a day (BID) | ORAL | 2 refills | Status: DC

## 2018-08-03 MED ORDER — GUAIFENESIN-CODEINE 100-10 MG/5ML PO SOLN: 5.0000 mL | Freq: Four times a day (QID) | ORAL | 0 refills | Status: DC | PRN

## 2018-08-03 NOTE — Patient Instructions (Addendum)
See me in the next 2 weeks for your medicare wellness visit.  We will test your memory during that visit.  You had labs performed today.  You will be contacted with the results of the labs once they are available, usually in the next 3 business days for routine lab work.  If you had a pap smear or biopsy performed, expect to be contacted in about 7-10 days.  Carbohydrate Counting for Diabetes Mellitus, Adult  Carbohydrate counting is a method of keeping track of how many carbohydrates you eat. Eating carbohydrates naturally increases the amount of sugar (glucose) in the blood. Counting how many carbohydrates you eat helps keep your blood glucose within normal limits, which helps you manage your diabetes (diabetes mellitus). It is important to know how many carbohydrates you can safely have in each meal. This is different for every person. A diet and nutrition specialist (registered dietitian) can help you make a meal plan and calculate how many carbohydrates you should have at each meal and snack. Carbohydrates are found in the following foods:  Grains, such as breads and cereals.  Dried beans and soy products.  Starchy vegetables, such as potatoes, peas, and corn.  Fruit and fruit juices.  Milk and yogurt.  Sweets and snack foods, such as cake, cookies, candy, chips, and soft drinks. How do I count carbohydrates? There are two ways to count carbohydrates in food. You can use either of the methods or a combination of both. Reading "Nutrition Facts" on packaged food The "Nutrition Facts" list is included on the labels of almost all packaged foods and beverages in the [place] It includes:  The serving size.  Information about nutrients in each serving, including the grams (g) of carbohydrate per serving. To use the "Nutrition Facts":  Decide how many servings you will have.  Multiply the number of servings by the number of carbohydrates per serving.  The resulting number is the total  amount of carbohydrates that you will be having. Learning standard serving sizes of other foods When you eat carbohydrate foods that are not packaged or do not include "Nutrition Facts" on the label, you need to measure the servings in order to count the amount of carbohydrates:  Measure the foods that you will eat with a food scale or measuring cup, if needed.  Decide how many standard-size servings you will eat.  Multiply the number of servings by 15. Most carbohydrate-rich foods have about 15 g of carbohydrates per serving. ? For example, if you eat 8 oz (170 g) of strawberries, you will have eaten 2 servings and 30 g of carbohydrates (2 servings x 15 g = 30 g).  For foods that have more than one food mixed, such as soups and casseroles, you must count the carbohydrates in each food that is included. The following list contains standard serving sizes of common carbohydrate-rich foods. Each of these servings has about 15 g of carbohydrates:   hamburger bun or  English muffin.   oz (15 mL) syrup.   oz (14 g) jelly.  1 slice of bread.  1 six-inch tortilla.  3 oz (85 g) cooked rice or pasta.  4 oz (113 g) cooked dried beans.  4 oz (113 g) starchy vegetable, such as peas, corn, or potatoes.  4 oz (113 g) hot cereal.  4 oz (113 g) mashed potatoes or  of a large baked potato.  4 oz (113 g) canned or frozen fruit.  4 oz (120 mL) fruit juice.  4-6 crackers.  6 chicken nuggets.  6 oz (170 g) unsweetened dry cereal.  6 oz (170 g) plain fat-free yogurt or yogurt sweetened with artificial sweeteners.  8 oz (240 mL) milk.  8 oz (170 g) fresh fruit or one small piece of fruit.  24 oz (680 g) popped popcorn. Example of carbohydrate counting Sample meal  3 oz (85 g) chicken breast.  6 oz (170 g) brown rice.  4 oz (113 g) corn.  8 oz (240 mL) milk.  8 oz (170 g) strawberries with sugar-free whipped topping. Carbohydrate calculation 1. Identify the foods that  contain carbohydrates: ? Rice. ? Corn. ? Milk. ? Strawberries. 2. Calculate how many servings you have of each food: ? 2 servings rice. ? 1 serving corn. ? 1 serving milk. ? 1 serving strawberries. 3. Multiply each number of servings by 15 g: ? 2 servings rice x 15 g = 30 g. ? 1 serving corn x 15 g = 15 g. ? 1 serving milk x 15 g = 15 g. ? 1 serving strawberries x 15 g = 15 g. 4. Add together all of the amounts to find the total grams of carbohydrates eaten: ? 30 g + 15 g + 15 g + 15 g = 75 g of carbohydrates total. Summary  Carbohydrate counting is a method of keeping track of how many carbohydrates you eat.  Eating carbohydrates naturally increases the amount of sugar (glucose) in the blood.  Counting how many carbohydrates you eat helps keep your blood glucose within normal limits, which helps you manage your diabetes.  A diet and nutrition specialist (registered dietitian) can help you make a meal plan and calculate how many carbohydrates you should have at each meal and snack. This information is not intended to replace advice given to you by your health care provider. Make sure you discuss any questions you have with your health care provider. Document Released: 08/03/2005 Document Revised: 02/10/2017 Document Reviewed: 01/15/2016 Elsevier Interactive Patient Education  2019 [employer].

## 2018-08-03 NOTE — Progress Notes (Signed)
Subjective: CC: Hospital follow up hyperglycemia PCP: [name], DO KGU:[id number] Donna Rogers is Donna 67 y.o. female presenting to clinic today for:  1. T2DM w/ hyperglycemia Patient was sent to the emergency department on 07/27/2018 after she was found to have blood sugar greater than 700.  She was resuscitated with IV fluids.  It was determined that she had no evidence of diabetic ketoacidosis.  She was discharged home with glipizide 5 mg daily.  She follows up today for recheck.  She brings in Donna blood sugar log which notes that sugars initially were in the 400s but have since gone down into the mid to upper 200s.  She does note occasional blurred vision, malaise and occasional dizziness when the blood sugar is high.  She has been compliant with the glipizide 5 mg daily.  No low blood sugars observed.  She feels comfortable increasing to twice daily.  2.  Memory problems Additionally, she reports that she has had quite Donna bit of memory issues for over Donna year now.  She has trouble remembering things and often will bring her husband to office visits so that she can keep things straight.  ROS: Per HPI  Allergies  Allergen Reactions  . Ciprofloxacin Hcl Rash   Past Medical History:  Diagnosis Date  . Anemia   . Arthritis    rheumatoid  . Depression   . Diabetes ([place])   . Diabetes mellitus without complication ([place])   . GERD (gastroesophageal reflux disease)   . Headache   . Hyperlipidemia   . Hyperplastic rectal polyp 10/21/2015  . Hypertension    pt. denies  . Lymphocytic colitis   . Shortness of breath dyspnea    with exertion    Current Outpatient Medications:  .  albuterol (PROVENTIL HFA;VENTOLIN HFA) 108 (90 Base) MCG/ACT inhaler, Inhale 2 puffs into the lungs every 6 (six) hours as needed for wheezing or shortness of breath., Disp: 1 Inhaler, Rfl: 5 .  aspirin 81 MG tablet, Take 81 mg by mouth daily., Disp: , Rfl:  .  atenolol (TENORMIN) 25 MG tablet, Take 1 tablet  (25 mg total) by mouth daily., Disp: 90 tablet, Rfl: 0 .  budesonide (ENTOCORT EC) 3 MG 24 hr capsule, Take 6 mg by mouth daily., Disp: , Rfl:  .  cholecalciferol (VITAMIN D) 1000 UNITS tablet, Take 1,000 Units by mouth daily., Disp: , Rfl:  .  citalopram (CELEXA) 40 MG tablet, Take 1 tablet (40 mg total) by mouth daily. (Needs to be seen), Disp: 90 tablet, Rfl: 3 .  clotrimazole (MYCELEX) 10 MG troche, Take 1 tablet (10 mg total) by mouth 5 (five) times daily for 14 days., Disp: 70 tablet, Rfl: 0 .  diclofenac (VOLTAREN) 75 MG EC tablet, Take 75 mg by mouth daily., Disp: , Rfl:  .  diphenoxylate-atropine (LOMOTIL) 2.5-0.025 MG tablet, 1-2 tablets ac/hs prn, Disp: 180 tablet, Rfl: 1 .  gabapentin (NEURONTIN) 600 MG tablet, TAKE 2 TABLETS BY MOUTH TWICE DAILY, Disp: 120 tablet, Rfl: 2 .  glipiZIDE (GLUCOTROL) 5 MG tablet, Take 1 tablet (5 mg total) by mouth daily before breakfast., Disp: 30 tablet, Rfl: 0 .  guaiFENesin-codeine 100-10 MG/5ML syrup, Take 5 mLs by mouth every 6 (six) hours as needed for cough., Disp: 100 mL, Rfl: 0 .  HYDROcodone-acetaminophen (NORCO/VICODIN) 5-325 MG per tablet, Take 1 tablet by mouth every 6 (six) hours as needed. (Patient taking differently: Take 1 tablet by mouth every 6 (six) hours as needed for moderate  pain. ), Disp: 60 tablet, Rfl: 0 .  lidocaine (XYLOCAINE) 2 % solution, Swish and spit 88m every 4 hours as needed for oral pain, Disp: 100 mL, Rfl: 0 .  nystatin (MYCOSTATIN) 100000 UNIT/ML suspension, Take 5 mLs (500,000 Units total) by mouth 4 (four) times daily., Disp: 60 mL, Rfl: 0 .  omeprazole (PRILOSEC) 20 MG capsule, Take 1 capsule (20 mg total) by mouth daily., Disp: 90 capsule, Rfl: 0 .  simvastatin (ZOCOR) 40 MG tablet, Take 1 tablet (40 mg total) by mouth daily., Disp: 90 tablet, Rfl: 0 .  sulfaSALAzine (AZULFIDINE) 500 MG tablet, Take 1,000 mg by mouth 2 (two) times daily. Reported on 11/28/2015, Disp: , Rfl:  .  Tiotropium Bromide Monohydrate (SPIRIVA  RESPIMAT) 2.5 MCG/ACT AERS, Inhale 2 puffs into the lungs daily., Disp: 2 Inhaler, Rfl: 0 .  Tiotropium Bromide Monohydrate (SPIRIVA RESPIMAT) 2.5 MCG/ACT AERS, Inhale 2 puffs into the lungs daily., Disp: 1 Inhaler, Rfl: 3 .  vitamin C (ASCORBIC ACID) 500 MG tablet, Take 500 mg by mouth daily., Disp: , Rfl:  Social History   Socioeconomic History  . Marital status: Single    Spouse name: Not on file  . Number of children: 1  . Years of education: Not on file  . Highest education level: Not on file  Occupational History  . Occupation: taking care of parents  Social Needs  . Financial resource strain: Not on file  . Food insecurity:    Worry: Not on file    Inability: Not on file  . Transportation needs:    Medical: Not on file    Non-medical: Not on file  Tobacco Use  . Smoking status: Current Every Day Smoker    Packs/day: 0.50    Years: 36.00    Pack years: 18.00    Types: Cigarettes  . Smokeless tobacco: Current User  . Tobacco comment: less than Donna pack daily  Substance and Sexual Activity  . Alcohol use: No    Alcohol/week: 0.0 standard drinks  . Drug use: No  . Sexual activity: Not Currently  Lifestyle  . Physical activity:    Days per week: Not on file    Minutes per session: Not on file  . Stress: Not on file  Relationships  . Social connections:    Talks on phone: Not on file    Gets together: Not on file    Attends religious service: Not on file    Active member of club or organization: Not on file    Attends meetings of clubs or organizations: Not on file    Relationship status: Not on file  . Intimate partner violence:    Fear of current or ex partner: Not on file    Emotionally abused: Not on file    Physically abused: Not on file    Forced sexual activity: Not on file  Other Topics Concern  . Not on file  Social History Narrative   The patient is divorced she has 1 sonWho is married that she has Donna grandchild.   She is not employed though the chart  indicates she has cared for her parents   She does not use alcohol or drugs she drinks 3-4 caffeinated beverages daily   She is Donna smoker   Family History  Problem Relation Age of Onset  . Alzheimer's disease Mother   . Lung cancer Mother   . Diabetes Father   . Colon cancer Neg Hx   . Stomach cancer Neg  Hx     Objective: Office vital signs reviewed. BP 129/72   Pulse 90   Temp (!) 97.4 F (36.3 C) (Oral)   Ht 5' 1"  (1.549 m)   Wt 199 lb (90.3 kg)   BMI 37.60 kg/m   Physical Examination:  General: Awake, alert, well nourished, No acute distress HEENT: Normal, sclera white, MMM Cardio: regular rate and rhythm, S1S2 heard, no murmurs appreciated Pulm: clear to auscultation bilaterally, no wheezes, rhonchi or rales; normal work of breathing on room air Neuro: Looks to her husband during exam when she forgets.  She is able to follow commands.  She does ask me to repeat things intermittently.  Assessment/ Plan: 67 y.o. female   1. Type 2 diabetes mellitus with hyperglycemia, without long-term current use of insulin (HCC) Given persistently elevated blood sugars greater than 200 okay to increase glipizide to 5 mg p.o. twice daily.  We discussed carb counting and Donna handout was provided to patient today.  Check A1c.  Referred to medical nutrition for diabetes counseling/ dietary advisement.  She is to schedule her diabetic eye exam.  We will plan for urine microalbumin at next visit. - Bayer DCA Hb A1c Waived - Amb ref to Medical Nutrition Therapy-MNT  2. Elevated liver function tests Noted to be elevated in the emergency department.  Check these today. - CMP14+EGFR  3. Leukocytosis, unspecified type Noted to be mildly elevated in the emergency department.  We will recheck today. - CBC  4. Memory deficit Will obtain labs along with her labs today to further evaluate the memory deficit.  She will follow-up for her Medicare visit in the next 2 weeks and we will perform formal  memory testing.  May need to obtain MRI of the brain as well.  Because this is been ongoing for greater than 2 years, she was not sent to the emergency department for altered mentation. - RPR - HIV Antibody (routine testing w rflx) - Vitamin B12 - TSH  5. Screening for HIV (human immunodeficiency virus) - HIV Antibody (routine testing w rflx)  6. Encounter for hepatitis C screening test for low risk patient - Hepatitis C antibody   Orders Placed This Encounter  Procedures  . Bayer DCA Hb A1c Waived  . CMP14+EGFR  . CBC  . RPR  . HIV Antibody (routine testing w rflx)  . Vitamin B12  . TSH  . Hepatitis C antibody  . Amb ref to Medical Nutrition Therapy-MNT    Referral Priority:   Routine    Referral Type:   Consultation    Referral Reason:   Specialty Services Required    Requested Specialty:   Nutrition    Number of Visits Requested:   1   Meds ordered this encounter  Medications  . guaiFENesin-codeine 100-10 MG/5ML syrup    Sig: Take 5 mLs by mouth every 6 (six) hours as needed for cough.    Dispense:  100 mL    Refill:  0  . glipiZIDE (GLUCOTROL) 5 MG tablet    Sig: Take 1 tablet (5 mg total) by mouth 2 (two) times daily before Donna meal.    Dispense:  60 tablet    Refill:  2     Donna Dicenzo Windell Moulding, DO [place] [phone]

## 2018-08-05 LAB — CMP14+EGFR
ALT: 50 IU/L — ABNORMAL HIGH (ref 0–32)
AST: 34 IU/L (ref 0–40)
Albumin/Globulin Ratio: 1.5 (ref 1.2–2.2)
Albumin: 4.1 g/dL (ref 3.6–4.8)
Alkaline Phosphatase: 99 IU/L (ref 39–117)
BUN/Creatinine Ratio: 17 (ref 12–28)
BUN: 15 mg/dL (ref 8–27)
Bilirubin Total: 0.2 mg/dL (ref 0.0–1.2)
CO2: 25 mmol/L (ref 20–29)
CREATININE: 0.9 mg/dL (ref 0.57–1.00)
Calcium: 10.2 mg/dL (ref 8.7–10.3)
Chloride: 96 mmol/L (ref 96–106)
GFR calc Af Amer: 78 mL/min/{1.73_m2} (ref >59)
GFR, EST NON AFRICAN AMERICAN: 67 mL/min/{1.73_m2} (ref >59)
GLUCOSE: 186 mg/dL — AB (ref 65–99)
Globulin, Total: 2.7 g/dL (ref 1.5–4.5)
Potassium: 4.3 mmol/L (ref 3.5–5.2)
Sodium: 138 mmol/L (ref 134–144)
Total Protein: 6.8 g/dL (ref 6.0–8.5)

## 2018-08-05 LAB — CBC
HEMOGLOBIN: 14.3 g/dL (ref 11.1–15.9)
Hematocrit: 41.2 % (ref 34.0–46.6)
MCH: 30.5 pg (ref 26.6–33.0)
MCHC: 34.7 g/dL (ref 31.5–35.7)
MCV: 88 fL (ref 79–97)
Platelets: 242 10*3/uL (ref 150–450)
RBC: 4.69 x10E6/uL (ref 3.77–5.28)
RDW: 12.4 % (ref 12.3–15.4)
WBC: 8 10*3/uL (ref 3.4–10.8)

## 2018-08-05 LAB — HIV ANTIBODY (ROUTINE TESTING W REFLEX): HIV Screen 4th Generation wRfx: NONREACTIVE

## 2018-08-05 LAB — RPR: RPR Ser Ql: NONREACTIVE

## 2018-08-05 LAB — TSH: TSH: 0.706 u[IU]/mL (ref 0.450–4.500)

## 2018-08-05 LAB — VITAMIN B12: Vitamin B-12: 1008 pg/mL (ref 232–1245)

## 2018-08-05 LAB — HEPATITIS C ANTIBODY: Hep C Virus Ab: <0.1 s/co ratio (ref 0.0–0.9)

## 2018-08-08 ENCOUNTER — Encounter: Payer: Self-pay | Admitting: Acute Care

## 2018-08-08 ENCOUNTER — Other Ambulatory Visit: Payer: Self-pay | Admitting: Family Medicine

## 2018-08-08 ENCOUNTER — Ambulatory Visit (INDEPENDENT_AMBULATORY_CARE_PROVIDER_SITE_OTHER): Payer: Medicare Other | Admitting: Acute Care

## 2018-08-08 ENCOUNTER — Telehealth: Payer: Self-pay | Admitting: Family Medicine

## 2018-08-08 ENCOUNTER — Ambulatory Visit (INDEPENDENT_AMBULATORY_CARE_PROVIDER_SITE_OTHER)
Admission: RE | Admit: 2018-08-08 | Discharge: 2018-08-08 | Disposition: A | Discharge status: Home / Self Care | Payer: Medicare Other | Source: Ambulatory Visit | Attending: Acute Care | Admitting: Acute Care

## 2018-08-08 VITALS — BP 124/86 | HR 88 | Ht 61.0 in | Wt 200.0 lb

## 2018-08-08 DIAGNOSIS — F17210 Nicotine dependence, cigarettes, uncomplicated: Secondary | ICD-10-CM

## 2018-08-08 DIAGNOSIS — Z122 Encounter for screening for malignant neoplasm of respiratory organs: Secondary | ICD-10-CM

## 2018-08-08 DIAGNOSIS — Z87891 Personal history of nicotine dependence: Secondary | ICD-10-CM

## 2018-08-08 DIAGNOSIS — K2100 Gastro-esophageal reflux disease with esophagitis, without bleeding: Secondary | ICD-10-CM

## 2018-08-08 NOTE — Telephone Encounter (Signed)
Pt aware of results 

## 2018-08-08 NOTE — Progress Notes (Signed)
Shared Decision Making Visit Lung Cancer Screening Program [phone])   Eligibility:  Age 67 y.o.  Pack Years Smoking History Calculation 34 pack year smoking history (# packs/per year x # years smoked)  Recent History of coughing up blood  no  Unexplained weight loss? no ( >Than 15 pounds within the last 6 months )  Prior History Lung / other cancer no (Diagnosis within the last 5 years already requiring surveillance chest CT Scans).  Smoking Status Current Smoker  Former Smokers: Years since quit: NA  Quit Date: NA  Visit Components:  Discussion included one or more decision making aids. yes  Discussion included risk/benefits of screening. yes  Discussion included potential follow up diagnostic testing for abnormal scans. yes  Discussion included meaning and risk of over diagnosis. no  Discussion included meaning and risk of False Positives. yes  Discussion included meaning of total radiation exposure. yes  Counseling Included:  Importance of adherence to annual lung cancer LDCT screening. yes  Impact of comorbidities on ability to participate in the program. yes  Ability and willingness to under diagnostic treatment. yes  Smoking Cessation Counseling:  Current Smokers:   Discussed importance of smoking cessation. yes  Information about tobacco cessation classes and interventions provided to patient. yes  Patient provided with "ticket" for LDCT Scan. yes  Symptomatic Patient. no  Counseling  Diagnosis Code: Tobacco Use Z72.0  Asymptomatic Patient yes  Counseling (Intermediate counseling: > three minutes counseling) C1448  Former Smokers:   Discussed the importance of maintaining cigarette abstinence. yes  Diagnosis Code: Personal History of Nicotine Dependence. J85.631  Information about tobacco cessation classes and interventions provided to patient. Yes  Patient provided with "ticket" for LDCT Scan. yes  Written Order for Lung Cancer  Screening with LDCT placed in Epic. Yes (CT Chest Lung Cancer Screening Low Dose W/O CM) [id number] Z12.2-Screening of respiratory organs Z87.891-Personal history of nicotine dependence  I have spent 25 minutes of face to face time with Donna Rogers discussing the risks and benefits of lung cancer screening. We viewed a power point together that explained in detail the above noted topics. We paused at intervals to allow for questions to be asked and answered to ensure understanding.We discussed that the single most powerful action that she can take to decrease her risk of developing lung cancer is to quit smoking. We discussed whether or not she is ready to commit to setting a quit date. We discussed options for tools to aid in quitting smoking including nicotine replacement therapy, non-nicotine medications, support groups, Quit Smart classes, and behavior modification. We discussed that often times setting smaller, more achievable goals, such as eliminating 1 cigarette a day for a week and then 2 cigarettes a day for a week can be helpful in slowly decreasing the number of cigarettes smoked. This allows for a sense of accomplishment as well as providing a clinical benefit. I gave her the " Be Stronger Than Your Excuses" card with contact information for community resources, classes, free nicotine replacement therapy, and access to mobile apps, text messaging, and on-line smoking cessation help. I have also given her my card and contact information in the event she needs to contact me. We discussed the time and location of the scan, and that either Donna Glassman RN or I will call with the results within 24-48 hours of receiving them. I have offered her  a copy of the power point we viewed  as a resource in the event they need reinforcement of  the concepts we discussed today in the office. The patient verbalized understanding of all of  the above and had no further questions upon leaving the office. They have my  contact information in the event they have any further questions.  I spent 4 minutes counseling on smoking cessation and the health risks of continued tobacco abuse.  I explained to the patient that there has been a high incidence of coronary artery disease noted on these exams. I explained that this is a non-gated exam therefore degree or severity cannot be determined. This patient is currently on statin therapy. I have asked the patient to follow-up with their PCP regarding any incidental finding of coronary artery disease and management with diet or medication as their PCP  feels is clinically indicated. The patient verbalized understanding of the above and had no further questions upon completion of the visit.      [name], NP 08/08/2018 3:20 PM

## 2018-08-15 ENCOUNTER — Ambulatory Visit: Payer: Medicare Other | Admitting: Family Medicine

## 2018-08-19 ENCOUNTER — Other Ambulatory Visit: Payer: Self-pay | Admitting: Acute Care

## 2018-08-19 DIAGNOSIS — F17210 Nicotine dependence, cigarettes, uncomplicated: Principal | ICD-10-CM

## 2018-08-19 DIAGNOSIS — Z122 Encounter for screening for malignant neoplasm of respiratory organs: Secondary | ICD-10-CM

## 2018-08-19 DIAGNOSIS — Z87891 Personal history of nicotine dependence: Secondary | ICD-10-CM

## 2018-08-24 ENCOUNTER — Ambulatory Visit: Payer: Medicare Other | Admitting: Pulmonary Disease

## 2018-09-01 ENCOUNTER — Ambulatory Visit: Payer: PRIVATE HEALTH INSURANCE | Admitting: Pulmonary Disease

## 2018-09-06 ENCOUNTER — Other Ambulatory Visit: Payer: Self-pay | Admitting: Family Medicine

## 2018-09-06 DIAGNOSIS — G629 Polyneuropathy, unspecified: Secondary | ICD-10-CM

## 2018-09-12 DIAGNOSIS — M0579 Rheumatoid arthritis with rheumatoid factor of multiple sites without organ or systems involvement: Secondary | ICD-10-CM | POA: Diagnosis not present

## 2018-09-12 DIAGNOSIS — M8580 Other specified disorders of bone density and structure, unspecified site: Secondary | ICD-10-CM | POA: Diagnosis not present

## 2018-09-12 DIAGNOSIS — M5136 Other intervertebral disc degeneration, lumbar region: Secondary | ICD-10-CM | POA: Diagnosis not present

## 2018-09-12 DIAGNOSIS — M150 Primary generalized (osteo)arthritis: Secondary | ICD-10-CM | POA: Diagnosis not present

## 2018-09-12 DIAGNOSIS — R197 Diarrhea, unspecified: Secondary | ICD-10-CM | POA: Diagnosis not present

## 2018-09-13 ENCOUNTER — Ambulatory Visit: Payer: Medicare Other | Admitting: Nutrition

## 2018-09-16 ENCOUNTER — Other Ambulatory Visit: Payer: Self-pay | Admitting: Family Medicine

## 2018-09-16 ENCOUNTER — Other Ambulatory Visit: Payer: Medicare Other

## 2018-09-16 DIAGNOSIS — E876 Hypokalemia: Secondary | ICD-10-CM

## 2018-09-16 LAB — BASIC METABOLIC PANEL
BUN/Creatinine Ratio: 10 — ABNORMAL LOW (ref 12–28)
BUN: 9 mg/dL (ref 8–27)
CALCIUM: 8.8 mg/dL (ref 8.7–10.3)
CO2: 25 mmol/L (ref 20–29)
Chloride: 99 mmol/L (ref 96–106)
Creatinine, Ser: 0.91 mg/dL (ref 0.57–1.00)
GFR calc Af Amer: 76 mL/min/{1.73_m2} (ref >59)
GFR calc non Af Amer: 66 mL/min/{1.73_m2} (ref >59)
Glucose: 127 mg/dL — ABNORMAL HIGH (ref 65–99)
Potassium: 2.8 mmol/L — ABNORMAL LOW (ref 3.5–5.2)
Sodium: 142 mmol/L (ref 134–144)

## 2018-09-16 LAB — MAGNESIUM: Magnesium: 1.8 mg/dL (ref 1.6–2.3)

## 2018-09-16 MED ORDER — POTASSIUM CHLORIDE CRYS ER 20 MEQ PO TBCR: 20.0000 meq | EXTENDED_RELEASE_TABLET | Freq: Two times a day (BID) | ORAL | 0 refills | Status: DC

## 2018-09-16 NOTE — Progress Notes (Signed)
Results for orders placed or performed in visit on 09/16/18 (from the past 24 hour(s))  Magnesium     Status: None   Collection Time: 09/16/18 12:07 PM  Result Value Ref Range   Magnesium 1.8 1.6 - 2.3 mg/dL   Narrative   Performed at:  [address] [place], [place]  [id number] Lab Director: Rush Farmer MD, Phone:  [phone]  Basic Metabolic Panel     Status: Abnormal   Collection Time: 09/16/18 12:07 PM  Result Value Ref Range   Glucose 127 (H) 65 - 99 mg/dL   BUN 9 8 - 27 mg/dL   Creatinine, Ser 0.91 0.57 - 1.00 mg/dL   GFR calc non Af Amer 66 >59 mL/min/1.73   GFR calc Af Amer 76 >59 mL/min/1.73   BUN/Creatinine Ratio 10 (L) 12 - 28   Sodium 142 134 - 144 mmol/L   Potassium 2.8 (L) 3.5 - 5.2 mmol/L   Chloride 99 96 - 106 mmol/L   CO2 25 20 - 29 mmol/L   Calcium 8.8 8.7 - 10.3 mg/dL   Narrative   Performed at:  [address] [place], [place]  [id number] Lab Director: Rush Farmer MD, Phone:  [phone]    Patient came in today for repeat potassium level.  She was contacted by her rheumatologist who noted that her potassium was 2.9 on their lab.  She denies any lower extremity cramping or fatigue.  She does report she had diarrheal illness for several days it has since resolved.  She is hydrating normally.  She is not on any diuretics.  I reviewed her lab results with her today which noted her potassium to be 2.8 with a normal magnesium level of 1.8.  I have sent in a 3-day course of potassium for her to take twice daily.  We will recheck her potassium level on Monday.  She is aware of need for this and will come in for labs Monday morning.  Marshal Eskew M. Lajuana Ripple, [facility]

## 2018-09-19 ENCOUNTER — Other Ambulatory Visit: Payer: Medicare Other

## 2018-09-19 DIAGNOSIS — E876 Hypokalemia: Secondary | ICD-10-CM

## 2018-09-20 LAB — BASIC METABOLIC PANEL
BUN/Creatinine Ratio: 10 — ABNORMAL LOW (ref 12–28)
BUN: 8 mg/dL (ref 8–27)
CO2: 27 mmol/L (ref 20–29)
Calcium: 9.3 mg/dL (ref 8.7–10.3)
Chloride: 99 mmol/L (ref 96–106)
Creatinine, Ser: 0.82 mg/dL (ref 0.57–1.00)
GFR calc non Af Amer: 75 mL/min/{1.73_m2} (ref >59)
GFR, EST AFRICAN AMERICAN: 86 mL/min/{1.73_m2} (ref >59)
Glucose: 95 mg/dL (ref 65–99)
Potassium: 3.6 mmol/L (ref 3.5–5.2)
SODIUM: 143 mmol/L (ref 134–144)

## 2018-09-21 ENCOUNTER — Ambulatory Visit: Payer: PRIVATE HEALTH INSURANCE | Admitting: Pulmonary Disease

## 2018-10-07 ENCOUNTER — Other Ambulatory Visit: Payer: Self-pay | Admitting: Family Medicine

## 2018-10-07 DIAGNOSIS — G629 Polyneuropathy, unspecified: Secondary | ICD-10-CM

## 2018-11-02 ENCOUNTER — Ambulatory Visit: Payer: Medicare Other | Admitting: Family Medicine

## 2018-11-08 ENCOUNTER — Other Ambulatory Visit: Payer: Self-pay | Admitting: Family Medicine

## 2018-11-08 DIAGNOSIS — G629 Polyneuropathy, unspecified: Secondary | ICD-10-CM

## 2018-11-08 NOTE — Telephone Encounter (Signed)
Please advise 

## 2018-11-17 ENCOUNTER — Other Ambulatory Visit: Payer: Self-pay | Admitting: Family Medicine

## 2018-12-16 ENCOUNTER — Other Ambulatory Visit: Payer: Self-pay | Admitting: Family Medicine

## 2018-12-22 ENCOUNTER — Other Ambulatory Visit: Payer: Self-pay | Admitting: Family Medicine

## 2018-12-22 NOTE — Telephone Encounter (Signed)
Gottschalk. NTBS. Last A1C 08/03/18 30 days given 11/17/18

## 2018-12-23 NOTE — Telephone Encounter (Signed)
LM for pt to call for appt 5/8-jhb

## 2018-12-26 ENCOUNTER — Ambulatory Visit: Payer: Medicare Other | Admitting: Family Medicine

## 2018-12-27 ENCOUNTER — Other Ambulatory Visit: Payer: Self-pay

## 2018-12-28 ENCOUNTER — Encounter: Payer: Self-pay | Admitting: Family Medicine

## 2018-12-28 ENCOUNTER — Ambulatory Visit (INDEPENDENT_AMBULATORY_CARE_PROVIDER_SITE_OTHER): Payer: Medicare Other | Admitting: Family Medicine

## 2018-12-28 VITALS — BP 130/85 | HR 75 | Temp 97.3°F | Ht 61.0 in | Wt 206.0 lb

## 2018-12-28 DIAGNOSIS — R945 Abnormal results of liver function studies: Secondary | ICD-10-CM

## 2018-12-28 DIAGNOSIS — M25561 Pain in right knee: Secondary | ICD-10-CM | POA: Diagnosis not present

## 2018-12-28 DIAGNOSIS — G8929 Other chronic pain: Secondary | ICD-10-CM | POA: Diagnosis not present

## 2018-12-28 DIAGNOSIS — E785 Hyperlipidemia, unspecified: Secondary | ICD-10-CM

## 2018-12-28 DIAGNOSIS — I10 Essential (primary) hypertension: Secondary | ICD-10-CM | POA: Diagnosis not present

## 2018-12-28 DIAGNOSIS — E119 Type 2 diabetes mellitus without complications: Secondary | ICD-10-CM

## 2018-12-28 DIAGNOSIS — E1169 Type 2 diabetes mellitus with other specified complication: Secondary | ICD-10-CM | POA: Diagnosis not present

## 2018-12-28 DIAGNOSIS — G629 Polyneuropathy, unspecified: Secondary | ICD-10-CM

## 2018-12-28 DIAGNOSIS — E1159 Type 2 diabetes mellitus with other circulatory complications: Secondary | ICD-10-CM

## 2018-12-28 DIAGNOSIS — E1165 Type 2 diabetes mellitus with hyperglycemia: Secondary | ICD-10-CM | POA: Diagnosis not present

## 2018-12-28 DIAGNOSIS — Z23 Encounter for immunization: Secondary | ICD-10-CM

## 2018-12-28 DIAGNOSIS — R7989 Other specified abnormal findings of blood chemistry: Secondary | ICD-10-CM

## 2018-12-28 LAB — BAYER DCA HB A1C WAIVED: HB A1C (BAYER DCA - WAIVED): 7 % — ABNORMAL HIGH (ref <7.0)

## 2018-12-28 MED ORDER — GLIPIZIDE 5 MG PO TABS: 5.0000 mg | ORAL_TABLET | Freq: Two times a day (BID) | ORAL | 3 refills | Status: DC

## 2018-12-28 MED ORDER — GABAPENTIN 600 MG PO TABS: 1200.0000 mg | ORAL_TABLET | Freq: Three times a day (TID) | ORAL | 2 refills | Status: DC

## 2018-12-28 MED ORDER — METHYLPREDNISOLONE ACETATE 40 MG/ML IJ SUSP
40.0000 mg | Freq: Once | INTRAMUSCULAR | Status: AC
  Administered 2018-12-28: 16:00:00 40 mg via INTRA_ARTICULAR

## 2018-12-28 MED ORDER — ATENOLOL 25 MG PO TABS: 25.0000 mg | ORAL_TABLET | Freq: Every day | ORAL | 1 refills | Status: DC

## 2018-12-28 NOTE — Progress Notes (Signed)
Subjective: CC: Hospital follow up hyperglycemia PCP: [name], DO KDX:[id number] [name] is a 68 y.o. female presenting to clinic today for:  1. T2DM w/ HTN & HLD Patient reports compliance with her glipizide 5 mg twice daily.  She has been out for the last 2 days but otherwise have been very consistent in taking this.  She is taking her simvastatin daily as directed as well as her atenolol for blood pressure.  She does have quite a bit of breakthrough neuropathy and is wondering if we can increase her dose of gabapentin.  She is currently taking 1200 mg twice daily.  She notes that vision has improved quite a bit since her sugars have been coming down.  She no longer is having blurry vision.  No foot ulcerations.  No chest pain, shortness of breath.  No loss of consciousness or dizziness.  Last eye exam: Needs Last foot exam: UTD Last A1c:  Lab Results  Component Value Date   HGBA1C 13.8 (H) 08/03/2018   Nephropathy screen indicated?: Needs Last flu, zoster and/or pneumovax:  Immunization History  Administered Date(s) Administered  . Influenza,inj,Quad PF,6+ Mos 05/25/2013, 07/31/2015, 06/18/2016, 05/16/2018  . PPD Test 05/08/2013  . Pneumococcal Conjugate-13 05/16/2018    2.  Chronic knee pain Patient reports longstanding history of right-sided knee pain.  She notes that pain seems worse with ambulation.  She has taken Tylenol but this has not helped.  Denies any significant swelling or discoloration.  No falls.  No preceding injury.  ROS: Per HPI  Allergies  Allergen Reactions  . Ciprofloxacin Hcl Rash   Past Medical History:  Diagnosis Date  . Anemia   . Arthritis    rheumatoid  . Depression   . Diabetes ([place])   . Diabetes mellitus without complication ([place])   . GERD (gastroesophageal reflux disease)   . Headache   . Hyperlipidemia   . Hyperplastic rectal polyp 10/21/2015  . Hypertension    pt. denies  . Lymphocytic colitis   . Shortness of breath  dyspnea    with exertion    Current Outpatient Medications:  .  albuterol (PROVENTIL HFA;VENTOLIN HFA) 108 (90 Base) MCG/ACT inhaler, Inhale 2 puffs into the lungs every 6 (six) hours as needed for wheezing or shortness of breath., Disp: 1 Inhaler, Rfl: 5 .  aspirin 81 MG tablet, Take 81 mg by mouth daily., Disp: , Rfl:  .  atenolol (TENORMIN) 25 MG tablet, Take 1 tablet (25 mg total) by mouth daily., Disp: 90 tablet, Rfl: 0 .  budesonide (ENTOCORT EC) 3 MG 24 hr capsule, Take 6 mg by mouth daily., Disp: , Rfl:  .  cholecalciferol (VITAMIN D) 1000 UNITS tablet, Take 1,000 Units by mouth daily., Disp: , Rfl:  .  citalopram (CELEXA) 40 MG tablet, Take 1 tablet (40 mg total) by mouth daily. (Needs to be seen), Disp: 90 tablet, Rfl: 3 .  diclofenac (VOLTAREN) 75 MG EC tablet, Take 75 mg by mouth daily., Disp: , Rfl:  .  diphenoxylate-atropine (LOMOTIL) 2.5-0.025 MG tablet, 1-2 tablets ac/hs prn, Disp: 180 tablet, Rfl: 1 .  gabapentin (NEURONTIN) 600 MG tablet, TAKE 2 TABLETS BY MOUTH TWICE DAILY, Disp: 120 tablet, Rfl: 2 .  glipiZIDE (GLUCOTROL) 5 MG tablet, Take 1 tablet (5 mg total) by mouth 2 (two) times daily before a meal., Disp: 60 tablet, Rfl: 0 .  guaiFENesin-codeine 100-10 MG/5ML syrup, Take 5 mLs by mouth every 6 (six) hours as needed for cough., Disp: 100 mL,  Rfl: 0 .  HYDROcodone-acetaminophen (NORCO/VICODIN) 5-325 MG per tablet, Take 1 tablet by mouth every 6 (six) hours as needed. (Patient taking differently: Take 1 tablet by mouth every 6 (six) hours as needed for moderate pain. ), Disp: 60 tablet, Rfl: 0 .  lidocaine (XYLOCAINE) 2 % solution, Swish and spit 86m every 4 hours as needed for oral pain, Disp: 100 mL, Rfl: 0 .  nystatin (MYCOSTATIN) 100000 UNIT/ML suspension, Take 5 mLs (500,000 Units total) by mouth 4 (four) times daily., Disp: 60 mL, Rfl: 0 .  omeprazole (PRILOSEC) 20 MG capsule, TAKE (1) CAPSULE DAILY, Disp: 90 capsule, Rfl: 1 .  potassium chloride SA (K-DUR,KLOR-CON)  20 MEQ tablet, Take 1 tablet (20 mEq total) by mouth 2 (two) times daily for 3 days., Disp: 6 tablet, Rfl: 0 .  simvastatin (ZOCOR) 40 MG tablet, Take 1 tablet (40 mg total) by mouth daily. (Needs to be seen before next refill), Disp: 30 tablet, Rfl: 0 .  sulfaSALAzine (AZULFIDINE) 500 MG tablet, Take 1,000 mg by mouth 2 (two) times daily. Reported on 11/28/2015, Disp: , Rfl:  .  Tiotropium Bromide Monohydrate (SPIRIVA RESPIMAT) 2.5 MCG/ACT AERS, Inhale 2 puffs into the lungs daily., Disp: 2 Inhaler, Rfl: 0 .  Tiotropium Bromide Monohydrate (SPIRIVA RESPIMAT) 2.5 MCG/ACT AERS, Inhale 2 puffs into the lungs daily., Disp: 1 Inhaler, Rfl: 3 .  vitamin C (ASCORBIC ACID) 500 MG tablet, Take 500 mg by mouth daily., Disp: , Rfl:  Social History   Socioeconomic History  . Marital status: Single    Spouse name: Not on file  . Number of children: 1  . Years of education: Not on file  . Highest education level: Not on file  Occupational History  . Occupation: taking care of parents  Social Needs  . Financial resource strain: Not on file  . Food insecurity:    Worry: Not on file    Inability: Not on file  . Transportation needs:    Medical: Not on file    Non-medical: Not on file  Tobacco Use  . Smoking status: Current Every Day Smoker    Packs/day: 0.75    Years: 46.00    Pack years: 34.50    Types: Cigarettes  . Smokeless tobacco: Current User  . Tobacco comment: less than a pack daily. States she cannot afford Chantix.  Substance and Sexual Activity  . Alcohol use: No    Alcohol/week: 0.0 standard drinks  . Drug use: No  . Sexual activity: Not Currently  Lifestyle  . Physical activity:    Days per week: Not on file    Minutes per session: Not on file  . Stress: Not on file  Relationships  . Social connections:    Talks on phone: Not on file    Gets together: Not on file    Attends religious service: Not on file    Active member of club or organization: Not on file    Attends  meetings of clubs or organizations: Not on file    Relationship status: Not on file  . Intimate partner violence:    Fear of current or ex partner: Not on file    Emotionally abused: Not on file    Physically abused: Not on file    Forced sexual activity: Not on file  Other Topics Concern  . Not on file  Social History Narrative   The patient is divorced she has 1 sonWho is married that she has a grandchild.   She  is not employed though the chart indicates she has cared for her parents   She does not use alcohol or drugs she drinks 3-4 caffeinated beverages daily   She is a smoker   Family History  Problem Relation Age of Onset  . Alzheimer's disease Mother   . Lung cancer Mother   . Diabetes Father   . Colon cancer Neg Hx   . Stomach cancer Neg Hx     Objective: Office vital signs reviewed. BP 130/85   Pulse 75   Temp (!) 97.3 F (36.3 C) (Oral)   Ht 5' 1"  (1.549 m)   Wt 206 lb (93.4 kg)   BMI 38.92 kg/m   Physical Examination:  General: Awake, alert, well nourished, No acute distress HEENT: Normal, sclera white, MMM Cardio: regular rate and rhythm, S1S2 heard, no murmurs appreciated Pulm: clear to auscultation bilaterally, no wheezes, rhonchi or rales; normal work of breathing on room air MSK:   Right knee : patient has preserved active range of motion.  No tenderness palpation to the patella, patellar tendon, quad tendon or posterior popliteal fossa.  No ligamentous laxity.  No palpable crepitus.  Negative Thessaly. Neuro: Light touch and station grossly intact  JOINT INJECTION:  Patient denies allergy to antiseptics (including iodine) and anesthetic.  Patient has a h/o controlled diabetes. No frequent steroid use, use of blood thinners/ antiplatelets.  Patient was given informed consent and a signed copy has been placed in the chart. Appropriate time out was taken. Area prepped and draped in usual sterile fashion. Anatomic landmarks were identified and injection  site was marked.  Ethyl chloride spray was used to numb the area and 1 cc of methylprednisolone 40 mg/ml plus  3 cc of 1% lidocaine without epinephrine was injected into the right knee using a(n) anteriomedial approach. The patient tolerated the procedure well and there were no immediate complications. Estimated blood loss is less than 1 cc.  Post procedure instructions were reviewed and handout outlining these instructions were provided to patient.   Assessment/ Plan: 68 y.o. female   1. Controlled type 2 diabetes mellitus without complication, without long-term current use of insulin ([place]) Quite a bit of improvement in her blood sugar with A1c of 7.0 today.  This is down from 13.8 with glipizide 5 mg twice daily.  This is been renewed. - Bayer DCA Hb A1c Waived - Microalbumin / creatinine urine ratio  2. Hypertension associated with diabetes ([place]) Controlled. - CMP14+EGFR  3. Hyperlipidemia associated with type 2 diabetes mellitus ([place]) Plan for fasting lipid panel at next visit.  Continue statin  4. Elevated LFTs Check CMP - CMP14+EGFR  5. Chronic pain of right knee Corticosteroid injection was administered during today's visit.  She tolerated this procedure without difficulty.  Home care instructions reviewed with the patient in house provided.  She will follow-up PRN  6. Neuropathy Increase dose to max dose of Neurontin, 1200 mg 3 times daily if tolerated.  May need to consider changing to Lyrica. - gabapentin (NEURONTIN) 600 MG tablet; Take 2 tablets (1,200 mg total) by mouth 3 (three) times daily.  Dispense: 120 tablet; Refill: 2   Orders Placed This Encounter  Procedures  . Tdap vaccine greater than or equal to 7yo IM  . Bayer DCA Hb A1c Waived  . Microalbumin / creatinine urine ratio  . CMP14+EGFR   Meds ordered this encounter  Medications  . gabapentin (NEURONTIN) 600 MG tablet    Sig: Take 2 tablets (1,200 mg total)  by mouth 3 (three) times daily.    Dispense:  120  tablet    Refill:  2  . atenolol (TENORMIN) 25 MG tablet    Sig: Take 1 tablet (25 mg total) by mouth daily.    Dispense:  90 tablet    Refill:  1  . glipiZIDE (GLUCOTROL) 5 MG tablet    Sig: Take 1 tablet (5 mg total) by mouth 2 (two) times daily before a meal.    Dispense:  180 tablet    Refill:  3  . methylPREDNISolone acetate (DEPO-MEDROL) injection 40 mg     [name], DO [place] [phone]

## 2018-12-28 NOTE — Patient Instructions (Signed)
Your sugar looks great today!  Your A1c is down to 7.0. Keep up the good work.   Knee Injection A knee injection is a procedure to get medicine into your knee joint to relieve the pain, swelling, and stiffness of arthritis. Your health care provider uses a needle to inject medicine, which may also help to lubricate and cushion your knee joint. You may need more than one injection. Tell a health care provider about:  Any allergies you have.  All medicines you are taking, including vitamins, herbs, eye drops, creams, and over-the-counter medicines.  Any problems you or family members have had with anesthetic medicines.  Any blood disorders you have.  Any surgeries you have had.  Any medical conditions you have.  Whether you are pregnant or may be pregnant. What are the risks? Generally, this is a safe procedure. However, problems may occur, including:  Infection.  Bleeding.  Symptoms that get worse.  Damage to the area around your knee.  Allergic reaction to any of the medicines.  Skin reactions from repeated injections. What happens before the procedure?  Ask your health care provider about changing or stopping your regular medicines. This is especially important if you are taking diabetes medicines or blood thinners.  Plan to have someone take you home from the hospital or clinic. What happens during the procedure?   You will sit or lie down in a position for your knee to be treated.  The skin over your kneecap will be cleaned with a germ-killing soap.  You will be given a medicine that numbs the area (local anesthetic). You may feel some stinging.  The medicine will be injected into your knee. The needle is carefully placed between your kneecap and your knee. The medicine is injected into the joint space.  The needle will be removed at the end of the procedure.  A bandage (dressing) may be placed over the injection site. The procedure may vary among health care  providers and hospitals. What can I expect after the procedure?  Your blood pressure, heart rate, breathing rate, and blood oxygen level will be monitored until you leave the hospital or clinic.  You may have to move your knee through its full range of motion. This helps to get all the medicine into your joint space.  You will be watched to make sure that you do not have a reaction to the injected medicine.  You may feel more pain, swelling, and warmth than you did before the injection. This reaction may last about 1-2 days. Follow these instructions at home: Medicines  Take over-the-counter and prescription medicines only as told by your doctor.  Do not drive or use heavy machinery while taking prescription pain medicine.  Do not take medicines such as aspirin and ibuprofen unless your health care provider tells you to take them. Injection site care  Follow instructions from your health care provider about: ? How to take care of your puncture site. ? When and how you should change your dressing. ? When you should remove your dressing.  Check your injection area every day for signs of infection. Check for: ? More redness, swelling, or pain after 2 days. ? Fluid or blood. ? Pus or a bad smell. ? Warmth. Managing pain, stiffness, and swelling   If directed, put ice on the injection area: ? Put ice in a plastic bag. ? Place a towel between your skin and the bag. ? Leave the ice on for 20 minutes, 2-3 times  per day.  Do not apply heat to your knee.  Raise (elevate) the injection area above the level of your heart while you are sitting or lying down. General instructions  If you were given a dressing, keep it dry until your health care provider says it can be removed. Ask your health care provider when you can start showering or taking a bath.  Avoid strenuous activities for as long as directed by your health care provider. Ask your health care provider when you can return to  your normal activities.  Keep all follow-up visits as told by your health care provider. This is important. You may need more injections. Contact a health care provider if you have:  A fever.  Warmth in your injection area.  Fluid, blood, or pus coming from your injection site.  Symptoms at your injection site that last longer than 2 days after your procedure. Get help right away if:  Your knee: ? Turns very red. ? Becomes very swollen. ? Is in severe pain. Summary  A knee injection is a procedure to get medicine into your knee joint to relieve the pain, swelling, and stiffness of arthritis.  A needle is carefully placed between your kneecap and your knee to inject medicine into the joint space.  Before the procedure, ask your health care provider about changing or stopping your regular medicines, especially if you are taking diabetes medicines or blood thinners.  Contact your health care provider if you have any problems or questions after your procedure. This information is not intended to replace advice given to you by your health care provider. Make sure you discuss any questions you have with your health care provider. Document Released: 10/25/2006 Document Revised: 08/23/2017 Document Reviewed: 08/23/2017 Elsevier Interactive Patient Education  2019 [employer].

## 2018-12-29 LAB — CMP14+EGFR
ALT: 32 IU/L (ref 0–32)
AST: 33 IU/L (ref 0–40)
Albumin/Globulin Ratio: 1.4 (ref 1.2–2.2)
Albumin: 4.3 g/dL (ref 3.8–4.8)
Alkaline Phosphatase: 89 IU/L (ref 39–117)
BUN/Creatinine Ratio: 12 (ref 12–28)
BUN: 11 mg/dL (ref 8–27)
Bilirubin Total: 0.4 mg/dL (ref 0.0–1.2)
CO2: 24 mmol/L (ref 20–29)
Calcium: 9.5 mg/dL (ref 8.7–10.3)
Chloride: 98 mmol/L (ref 96–106)
Creatinine, Ser: 0.95 mg/dL (ref 0.57–1.00)
GFR calc Af Amer: 72 mL/min/{1.73_m2} (ref >59)
GFR calc non Af Amer: 63 mL/min/{1.73_m2} (ref >59)
Globulin, Total: 3 g/dL (ref 1.5–4.5)
Glucose: 133 mg/dL — ABNORMAL HIGH (ref 65–99)
Potassium: 4.2 mmol/L (ref 3.5–5.2)
Sodium: 138 mmol/L (ref 134–144)
Total Protein: 7.3 g/dL (ref 6.0–8.5)

## 2018-12-29 LAB — MICROALBUMIN / CREATININE URINE RATIO
Creatinine, Urine: 155.6 mg/dL
Microalb/Creat Ratio: 15 mg/g creat (ref 0–29)
Microalbumin, Urine: 23.4 ug/mL

## 2019-01-19 DIAGNOSIS — M5136 Other intervertebral disc degeneration, lumbar region: Secondary | ICD-10-CM | POA: Diagnosis not present

## 2019-01-19 DIAGNOSIS — M8580 Other specified disorders of bone density and structure, unspecified site: Secondary | ICD-10-CM | POA: Diagnosis not present

## 2019-01-19 DIAGNOSIS — R197 Diarrhea, unspecified: Secondary | ICD-10-CM | POA: Diagnosis not present

## 2019-01-19 DIAGNOSIS — M150 Primary generalized (osteo)arthritis: Secondary | ICD-10-CM | POA: Diagnosis not present

## 2019-01-19 DIAGNOSIS — M0579 Rheumatoid arthritis with rheumatoid factor of multiple sites without organ or systems involvement: Secondary | ICD-10-CM | POA: Diagnosis not present

## 2019-02-09 IMAGING — DX DG LUMBAR SPINE 2-3V
3 series · 3 of 3 positions shown · non-contrast
Comparison: 09/13/2006

CLINICAL DATA: Low back pain

EXAM:
LUMBAR SPINE - 3 VIEW

[l-spine ap (1 of 2)]
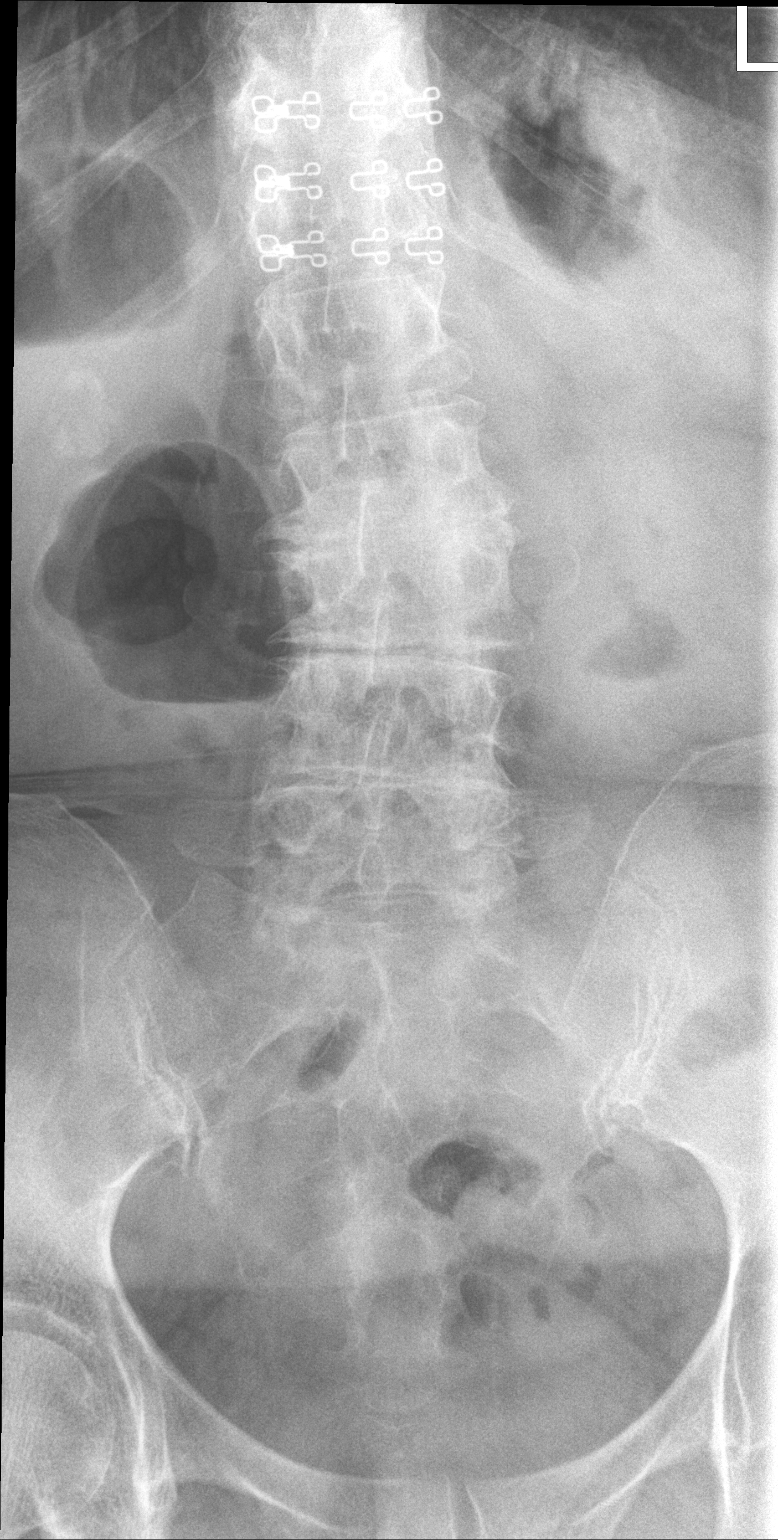

[l-spine lat]
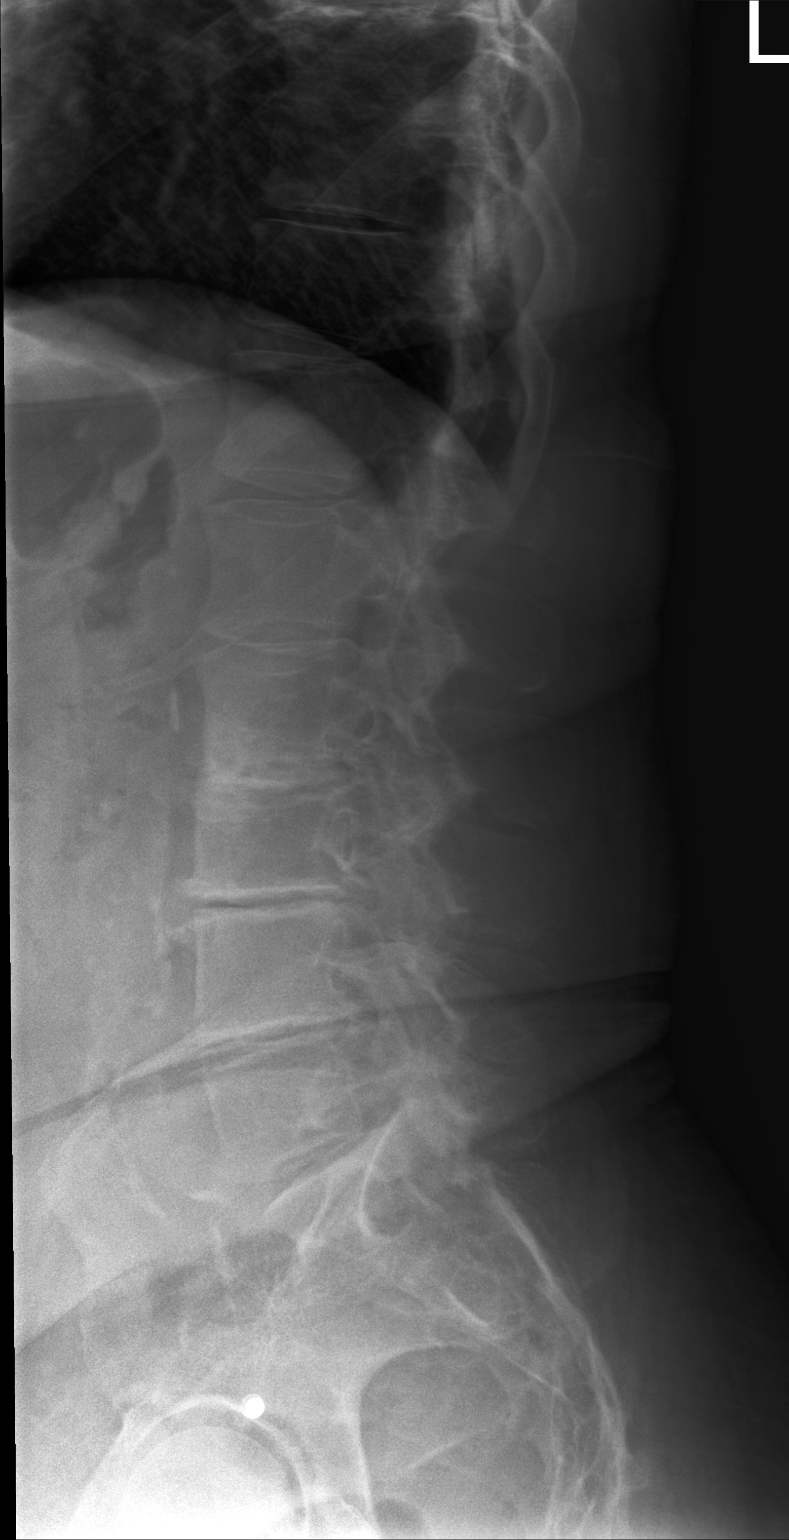

[l-spine ap (2 of 2)]
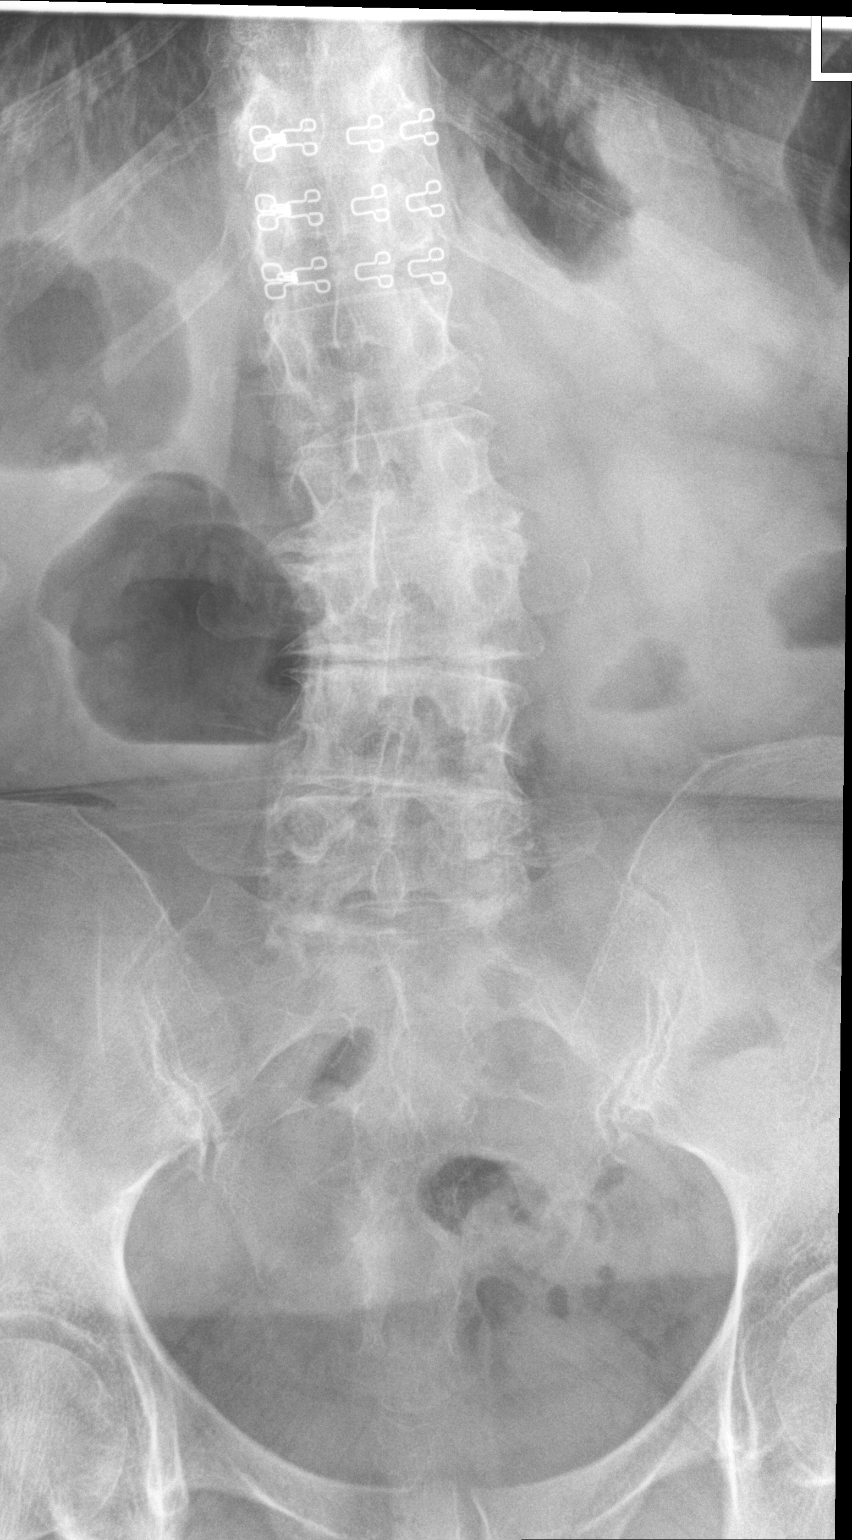

[3 of 3 positions shown; findings below may reference images not displayed]

FINDINGS: Five lumbar type vertebral bodies are well visualized. Vertebral
body height is well maintained. Multilevel disc space narrowing is
noted throughout the lumbar spine progressed in the interval from
the prior exam. Osteophytic changes are noted as well. No
anterolisthesis is seen. A somewhat rounded calcification is noted
in the right upper quadrant which may be related to cholelithiasis.
Clinical correlation is recommended.
IMPRESSION: Multilevel degenerative changes progressed when compared with the
prior exam of [DATE].

Rounded calcification in the right upper quadrant may represent
cholelithiasis.

## 2019-03-15 ENCOUNTER — Telehealth: Payer: Self-pay | Admitting: Internal Medicine

## 2019-03-15 ENCOUNTER — Other Ambulatory Visit: Payer: Self-pay | Admitting: Family Medicine

## 2019-03-15 DIAGNOSIS — K2100 Gastro-esophageal reflux disease with esophagitis, without bleeding: Secondary | ICD-10-CM

## 2019-03-15 NOTE — Telephone Encounter (Signed)
Pt reported that she is having chronic diarrhea and would like to start back on diphenoxylate.

## 2019-03-15 NOTE — Telephone Encounter (Signed)
Donna Rogers was last seen in November 2019 and was told to call back if her diarrhea came back which it has a week ago it started, no fever, no bleeding. She wants to know if she should start back on her Budesonide and Lomotil.

## 2019-03-16 MED ORDER — BUDESONIDE 3 MG PO CPEP: 9.0000 mg | ORAL_CAPSULE | Freq: Every day | ORAL | 1 refills | Status: DC

## 2019-03-16 MED ORDER — DIPHENOXYLATE-ATROPINE 2.5-0.025 MG PO TABS: ORAL_TABLET | ORAL | 0 refills | Status: DC

## 2019-03-16 NOTE — Telephone Encounter (Signed)
I informed Donna Rogers of the plan and made her a f/u appointment. Confirmed pharmacy and faxed the lomotil rx to the pharmacy, the budesonide went thru the system. She is aware to call back if no improvement before her appointment.

## 2019-03-16 NOTE — Telephone Encounter (Signed)
Resume budesonide 3mg  caps take 9 mg daily   #90  1 refill  also ok to refill Lomotil generic 1 to 2 qid prn # 90 no refill   RTC me in about 1 month  If she is not improving as she would expect call back sooner

## 2019-03-24 ENCOUNTER — Other Ambulatory Visit: Payer: Self-pay | Admitting: Family Medicine

## 2019-03-28 ENCOUNTER — Telehealth: Payer: Self-pay | Admitting: Family Medicine

## 2019-03-28 NOTE — Telephone Encounter (Signed)
Changed to televisit

## 2019-03-31 ENCOUNTER — Ambulatory Visit (INDEPENDENT_AMBULATORY_CARE_PROVIDER_SITE_OTHER): Payer: Medicare Other | Admitting: Family Medicine

## 2019-03-31 DIAGNOSIS — G629 Polyneuropathy, unspecified: Secondary | ICD-10-CM | POA: Diagnosis not present

## 2019-03-31 DIAGNOSIS — E1159 Type 2 diabetes mellitus with other circulatory complications: Secondary | ICD-10-CM

## 2019-03-31 DIAGNOSIS — K2100 Gastro-esophageal reflux disease with esophagitis, without bleeding: Secondary | ICD-10-CM

## 2019-03-31 DIAGNOSIS — E1169 Type 2 diabetes mellitus with other specified complication: Secondary | ICD-10-CM

## 2019-03-31 DIAGNOSIS — E119 Type 2 diabetes mellitus without complications: Secondary | ICD-10-CM | POA: Diagnosis not present

## 2019-03-31 DIAGNOSIS — E785 Hyperlipidemia, unspecified: Secondary | ICD-10-CM

## 2019-03-31 DIAGNOSIS — I10 Essential (primary) hypertension: Secondary | ICD-10-CM

## 2019-03-31 MED ORDER — CITALOPRAM HYDROBROMIDE 40 MG PO TABS: 40.0000 mg | ORAL_TABLET | Freq: Every day | ORAL | 0 refills | Status: DC

## 2019-03-31 MED ORDER — GABAPENTIN 600 MG PO TABS: 1200.0000 mg | ORAL_TABLET | Freq: Three times a day (TID) | ORAL | 2 refills | Status: DC

## 2019-03-31 NOTE — Progress Notes (Signed)
Telephone visit  Subjective: CC: f/u DM PCP: [name], DO EYC:[id number] A Lombardozzi is a 67 y.o. female calls for telephone consult today. Patient provides verbal consent for consult held via phone.  Location of patient: home Location of provider: Working remotely from home Others present for call: husband  1. Type 2 Diabetes w/ HTN, HLD:  Patient reports High at home: 160s; Low at home: 130,  FBG: 79-140s problems. Taking medication(s): Glipizide 5mg  BID, Zocor, Atenolol 25.  She reports that the neuropathy is improving w/ the max dose of Gabapentin and no adverse side effects.  Last eye exam: needs to schedule. Last foot exam: UTD Last A1c:  Lab Results  Component Value Date   HGBA1C 7.0 (H) 12/28/2018   Nephropathy screen indicated?: UTD Last flu, zoster and/or pneumovax:  Immunization History  Administered Date(s) Administered  . Influenza,inj,Quad PF,6+ Mos 05/25/2013, 07/31/2015, 06/18/2016, 05/16/2018  . PPD Test 05/08/2013  . Pneumococcal Conjugate-13 05/16/2018  . Tdap 12/28/2018    ROS: She has ongoing back pain but LE pain has greatly improved.  No chest pain, shortness of breath, dizziness or falls.    ROS: Per HPI  Allergies  Allergen Reactions  . Ciprofloxacin Hcl Rash   Past Medical History:  Diagnosis Date  . Anemia   . Arthritis    rheumatoid  . Depression   . Diabetes ([place])   . Diabetes mellitus without complication ([place])   . GERD (gastroesophageal reflux disease)   . Headache   . Hyperlipidemia   . Hyperplastic rectal polyp 10/21/2015  . Hypertension    pt. denies  . Lymphocytic colitis   . Shortness of breath dyspnea    with exertion    Current Outpatient Medications:  .  aspirin 81 MG tablet, Take 81 mg by mouth daily., Disp: , Rfl:  .  atenolol (TENORMIN) 25 MG tablet, Take 1 tablet (25 mg total) by mouth daily., Disp: 90 tablet, Rfl: 1 .  budesonide (ENTOCORT EC) 3 MG 24 hr capsule, Take 3 capsules (9 mg total) by mouth  daily., Disp: 90 capsule, Rfl: 1 .  cholecalciferol (VITAMIN D) 1000 UNITS tablet, Take 1,000 Units by mouth daily., Disp: , Rfl:  .  citalopram (CELEXA) 40 MG tablet, Take 1 tablet (40 mg total) by mouth daily. (Needs to be seen), Disp: 90 tablet, Rfl: 3 .  diclofenac (VOLTAREN) 75 MG EC tablet, Take 75 mg by mouth daily., Disp: , Rfl:  .  diphenoxylate-atropine (LOMOTIL) 2.5-0.025 MG tablet, Take 1 to 2 tablets four times a day as needed., Disp: 90 tablet, Rfl: 0 .  gabapentin (NEURONTIN) 600 MG tablet, Take 2 tablets (1,200 mg total) by mouth 3 (three) times daily., Disp: 120 tablet, Rfl: 2 .  glipiZIDE (GLUCOTROL) 5 MG tablet, Take 1 tablet (5 mg total) by mouth 2 (two) times daily before a meal., Disp: 180 tablet, Rfl: 3 .  HYDROcodone-acetaminophen (NORCO/VICODIN) 5-325 MG per tablet, Take 1 tablet by mouth every 6 (six) hours as needed. (Patient taking differently: Take 1 tablet by mouth every 6 (six) hours as needed for moderate pain. ), Disp: 60 tablet, Rfl: 0 .  omeprazole (PRILOSEC) 20 MG capsule, TAKE (1) CAPSULE DAILY, Disp: 90 capsule, Rfl: 0 .  simvastatin (ZOCOR) 40 MG tablet, TAKE 1 TABLET DAILY, Disp: 90 tablet, Rfl: 0 .  vitamin C (ASCORBIC ACID) 500 MG tablet, Take 500 mg by mouth daily., Disp: , Rfl:   Assessment/ Plan: 68 y.o. female   1. Controlled type 2 diabetes  mellitus without complication, without long-term current use of insulin ([place]) Plan for A1c check at next visit.  Her last A1c was at goal at 7.0.  Continue current regimen.  Appointment has been scheduled for November 16.  Patient aware of date and time.  In the interim, she will schedule her diabetic eye exam.  I have given her the telephone number to a local optometrist.  2. Hyperlipidemia associated with type 2 diabetes mellitus ([place]) Continue statin.  3. Hypertension associated with diabetes ([place]) Continue atenolol  4. Neuropathy Under much better control with increased dose of gabapentin.  She is having  no adverse side effects.  Continue current regimen - gabapentin (NEURONTIN) 600 MG tablet; Take 2 tablets (1,200 mg total) by mouth 3 (three) times daily.  Dispense: 120 tablet; Refill: 2   Start time: 1:08pm End time: 1:17pm  Total time spent on patient care (including telephone call/ virtual visit): 15 minutes  [place], [place] [phone]

## 2019-04-13 ENCOUNTER — Ambulatory Visit: Payer: PRIVATE HEALTH INSURANCE | Admitting: Internal Medicine

## 2019-05-17 DIAGNOSIS — M0579 Rheumatoid arthritis with rheumatoid factor of multiple sites without organ or systems involvement: Secondary | ICD-10-CM | POA: Diagnosis not present

## 2019-05-22 DIAGNOSIS — M150 Primary generalized (osteo)arthritis: Secondary | ICD-10-CM | POA: Diagnosis not present

## 2019-05-22 DIAGNOSIS — M0579 Rheumatoid arthritis with rheumatoid factor of multiple sites without organ or systems involvement: Secondary | ICD-10-CM | POA: Diagnosis not present

## 2019-05-22 DIAGNOSIS — M8580 Other specified disorders of bone density and structure, unspecified site: Secondary | ICD-10-CM | POA: Diagnosis not present

## 2019-05-22 DIAGNOSIS — R197 Diarrhea, unspecified: Secondary | ICD-10-CM | POA: Diagnosis not present

## 2019-05-22 DIAGNOSIS — M5136 Other intervertebral disc degeneration, lumbar region: Secondary | ICD-10-CM | POA: Diagnosis not present

## 2019-05-24 DIAGNOSIS — M48062 Spinal stenosis, lumbar region with neurogenic claudication: Secondary | ICD-10-CM | POA: Diagnosis not present

## 2019-05-24 DIAGNOSIS — M5416 Radiculopathy, lumbar region: Secondary | ICD-10-CM | POA: Diagnosis not present

## 2019-06-17 DIAGNOSIS — M5136 Other intervertebral disc degeneration, lumbar region: Secondary | ICD-10-CM | POA: Diagnosis not present

## 2019-06-18 ENCOUNTER — Other Ambulatory Visit: Payer: Self-pay | Admitting: Family Medicine

## 2019-06-18 DIAGNOSIS — K2100 Gastro-esophageal reflux disease with esophagitis, without bleeding: Secondary | ICD-10-CM

## 2019-06-28 ENCOUNTER — Other Ambulatory Visit: Payer: Self-pay | Admitting: Family Medicine

## 2019-06-30 DIAGNOSIS — M5136 Other intervertebral disc degeneration, lumbar region: Secondary | ICD-10-CM | POA: Diagnosis not present

## 2019-06-30 DIAGNOSIS — M545 Low back pain: Secondary | ICD-10-CM | POA: Diagnosis not present

## 2019-07-03 ENCOUNTER — Other Ambulatory Visit: Payer: Self-pay

## 2019-07-03 ENCOUNTER — Encounter: Payer: Self-pay | Admitting: Family Medicine

## 2019-07-03 ENCOUNTER — Ambulatory Visit (INDEPENDENT_AMBULATORY_CARE_PROVIDER_SITE_OTHER): Payer: Medicare Other | Admitting: Family Medicine

## 2019-07-03 VITALS — BP 137/83 | HR 70 | Temp 98.7°F | Ht 61.0 in | Wt 219.0 lb

## 2019-07-03 DIAGNOSIS — Z8349 Family history of other endocrine, nutritional and metabolic diseases: Secondary | ICD-10-CM | POA: Diagnosis not present

## 2019-07-03 DIAGNOSIS — R1310 Dysphagia, unspecified: Secondary | ICD-10-CM

## 2019-07-03 DIAGNOSIS — Z23 Encounter for immunization: Secondary | ICD-10-CM

## 2019-07-03 DIAGNOSIS — E119 Type 2 diabetes mellitus without complications: Secondary | ICD-10-CM | POA: Diagnosis not present

## 2019-07-03 DIAGNOSIS — E1165 Type 2 diabetes mellitus with hyperglycemia: Secondary | ICD-10-CM

## 2019-07-03 DIAGNOSIS — G629 Polyneuropathy, unspecified: Secondary | ICD-10-CM | POA: Diagnosis not present

## 2019-07-03 DIAGNOSIS — I10 Essential (primary) hypertension: Secondary | ICD-10-CM | POA: Diagnosis not present

## 2019-07-03 LAB — BAYER DCA HB A1C WAIVED: HB A1C (BAYER DCA - WAIVED): 8.1 % — ABNORMAL HIGH (ref <7.0)

## 2019-07-03 MED ORDER — GLIPIZIDE 10 MG PO TABS: 10.0000 mg | ORAL_TABLET | Freq: Two times a day (BID) | ORAL | 0 refills | Status: DC

## 2019-07-03 NOTE — Progress Notes (Signed)
Subjective: CC: DM2, obesity PCP: [name], DO QAS:UORVIFBP [name] is a 68 y.o. female presenting to clinic today for:  1. Type 2 Diabetes with neuropathy, hyperlipidemia and hypertension:  Patient reports that she is only intermittently checking blood sugars.  Blood sugars have been ranging in the upper 100s most days.  She is compliant with glipizide 5 mg twice daily.  She admits to not following a strict diet.  Her physical activity is limited due to chronic back pain, for which she is followed by orthopedics.  She is had several corticosteroid injection since her last visit and has subsequently seen increased blood sugars.  She continues to have quite a bit of difficulty losing weight and asked that we check thyroid panel on her today as mother had a history of thyroid issues that require thyroidectomy.  She would like diabetic shoes.  Last eye exam: home visit w/ insurance Last foot exam: needs Last A1c:  Lab Results  Component Value Date   HGBA1C 7.0 (H) 12/28/2018   Nephropathy screen indicated? UTD Last flu, zoster and/or pneumovax:  Immunization History  Administered Date(s) Administered  . Fluad Quad(high Dose 65+) 05/03/2019  . Influenza,inj,Quad PF,6+ Mos 05/25/2013, 07/31/2015, 06/18/2016, 05/16/2018  . Influenza-Unspecified 05/07/2019  . PPD Test 05/08/2013  . Pneumococcal Conjugate-13 05/16/2018  . Tdap 12/28/2018  . Zoster Recombinat (Shingrix) 06/06/2019    ROS: No chest pain, shortness of breath, dizziness or loss of consciousness  2.  Difficulty swallowing solids Patient reports several month history of difficulty swallowing solids.  She often has food that gets stuck in her throat and she has to get up to swallow totally.  She has no problem with liquids.  No nausea, vomiting, unplanned weight loss, blood in stool.  She is followed by [facility].  ROS: Per HPI  Allergies  Allergen Reactions  . Ciprofloxacin Hcl Rash   Past Medical History:   Diagnosis Date  . Anemia   . Arthritis    rheumatoid  . Depression   . Diabetes ([place])   . Diabetes mellitus without complication ([place])   . GERD (gastroesophageal reflux disease)   . Headache   . Hyperlipidemia   . Hyperplastic rectal polyp 10/21/2015  . Hypertension    pt. denies  . Lymphocytic colitis   . Shortness of breath dyspnea    with exertion    Current Outpatient Medications:  .  aspirin 81 MG tablet, Take 81 mg by mouth daily., Disp: , Rfl:  .  atenolol (TENORMIN) 25 MG tablet, Take 1 tablet (25 mg total) by mouth daily., Disp: 90 tablet, Rfl: 1 .  budesonide (ENTOCORT EC) 3 MG 24 hr capsule, Take 3 capsules (9 mg total) by mouth daily., Disp: 90 capsule, Rfl: 1 .  cholecalciferol (VITAMIN D) 1000 UNITS tablet, Take 1,000 Units by mouth daily., Disp: , Rfl:  .  citalopram (CELEXA) 40 MG tablet, Take 1 tablet (40 mg total) by mouth daily. (Needs to be seen), Disp: 90 tablet, Rfl: 0 .  diclofenac (VOLTAREN) 75 MG EC tablet, Take 75 mg by mouth daily., Disp: , Rfl:  .  diphenoxylate-atropine (LOMOTIL) 2.5-0.025 MG tablet, Take 1 to 2 tablets four times a day as needed., Disp: 90 tablet, Rfl: 0 .  etanercept (ENBREL) 50 MG/ML injection, Inject 50 mg into the skin once a week., Disp: , Rfl:  .  gabapentin (NEURONTIN) 600 MG tablet, Take 2 tablets (1,200 mg total) by mouth 3 (three) times daily., Disp: 120 tablet, Rfl:  2 .  glipiZIDE (GLUCOTROL) 5 MG tablet, Take 1 tablet (5 mg total) by mouth 2 (two) times daily before a meal., Disp: 180 tablet, Rfl: 3 .  HYDROcodone-acetaminophen (NORCO/VICODIN) 5-325 MG per tablet, Take 1 tablet by mouth every 6 (six) hours as needed. (Patient taking differently: Take 1 tablet by mouth every 6 (six) hours as needed for moderate pain. ), Disp: 60 tablet, Rfl: 0 .  influenza vaccine adjuvanted (FLUAD QUADRIVALENT) 0.5 ML injection, Fluad Quad 2020-2021(50yrup)(PF) 60 mcg (15 mcg x 4)/0.51mIM syringe, Disp: , Rfl:  .  omeprazole (PRILOSEC) 20 MG  capsule, TAKE (1) CAPSULE DAILY, Disp: 90 capsule, Rfl: 0 .  simvastatin (ZOCOR) 40 MG tablet, Take 1 tablet (40 mg total) by mouth daily. Needs visit for additional refills, Disp: 30 tablet, Rfl: 0 .  Tdap (BOOSTRIX) 5-2.5-18.5 LF-MCG/0.5 injection, Boostrix Tdap 2.5 Lf unit-8 mcg-5 Lf/0.5 mL intramuscular syringe, Disp: , Rfl:  .  vitamin C (ASCORBIC ACID) 500 MG tablet, Take 500 mg by mouth daily., Disp: , Rfl:  .  Zoster Vaccine Adjuvanted (SHINGRIX) injection, Shingrix (PF) 50 mcg/0.5 mL intramuscular suspension, kit, Disp: , Rfl:  Social History   Socioeconomic History  . Marital status: Single    Spouse name: Not on file  . Number of children: 1  . Years of education: Not on file  . Highest education level: Not on file  Occupational History  . Occupation: taking care of parents  Social Needs  . Financial resource strain: Not on file  . Food insecurity    Worry: Not on file    Inability: Not on file  . Transportation needs    Medical: Not on file    Non-medical: Not on file  Tobacco Use  . Smoking status: Current Every Day Smoker    Packs/day: 0.75    Years: 46.00    Pack years: 34.50    Types: Cigarettes  . Smokeless tobacco: Current User  . Tobacco comment: less than a pack daily. States she cannot afford Chantix.  Substance and Sexual Activity  . Alcohol use: No    Alcohol/week: 0.0 standard drinks  . Drug use: No  . Sexual activity: Not Currently  Lifestyle  . Physical activity    Days per week: Not on file    Minutes per session: Not on file  . Stress: Not on file  Relationships  . Social coHerbalistn phone: Not on file    Gets together: Not on file    Attends religious service: Not on file    Active member of club or organization: Not on file    Attends meetings of clubs or organizations: Not on file    Relationship status: Not on file  . Intimate partner violence    Fear of current or ex partner: Not on file    Emotionally abused: Not on file     Physically abused: Not on file    Forced sexual activity: Not on file  Other Topics Concern  . Not on file  Social History Narrative   The patient is divorced she has 1 sonWho is married that she has a grandchild.   She is not employed though the chart indicates she has cared for her parents   She does not use alcohol or drugs she drinks 3-4 caffeinated beverages daily   She is a smoker   Family History  Problem Relation Age of Onset  . Alzheimer's disease Mother   . Lung cancer  Mother   . Diabetes Father   . Colon cancer Neg Hx   . Stomach cancer Neg Hx     Objective: Office vital signs reviewed. BP (!) 171/92   Pulse 70   Temp 98.7 F (37.1 C) (Oral)   Ht 5' 1"  (1.549 m)   Wt 219 lb (99.3 kg)   SpO2 97%   BMI 41.38 kg/m   Physical Examination:  General: Awake, alert, obese, No acute distress HEENT: Normal; no exophthalmos.  No goiter.  No palpable thyroid nodules or thyromegaly. Cardio: regular rate and rhythm, S1S2 heard, no murmurs appreciated Pulm: clear to auscultation bilaterally, no wheezes, rhonchi or rales; normal work of breathing on room air Extremities: warm, well perfused, No edema, cyanosis or clubbing; +1 pulses bilaterally Skin: Onychomycotic changes Neuro: see below  Diabetic Foot Exam - Simple   Simple Foot Form Diabetic Foot exam was performed with the following findings: Yes 07/03/2019  1:37 PM  Visual Inspection See comments: Yes Sensation Testing See comments: Yes Pulse Check See comments: Yes Comments +1 pedal pulses.  She has onychomycotic changes throughout all nails of bilateral feet.  There are some skin changes as well.  No ulcerations or preulcerative calluses noted.  She does have decrease in station to monofilament throughout the right foot.  She has mild decrease in the left foot.    Results for orders placed or performed in visit on 07/03/19 (from the past 24 hour(s))  hgba1c     Status: Abnormal   Collection Time: 07/03/19  12:59 PM  Result Value Ref Range   HB A1C (BAYER DCA - WAIVED) 8.1 (H) <7.0 %   Narrative   Performed at:  [address] [place], [place]  [id number] Lab Director: Jackline Castilla [facility], Phone:  [phone]     Assessment/ Plan: 68 y.o. female   1. Uncontrolled type 2 diabetes mellitus with hyperglycemia ([place]) Sugar remains above goal at 8.1 today.  We are increasing her glipizide to 10 mg twice daily.  I do suspect that some of this has to do with the fact that she is had recent corticosteroid injections she had previously been controlled.  We discussed monitoring blood pressures and blood sugars very closely.  Low threshold to start decreasing the glipizide dose again once her blood sugars become better controlled.  We discussed goal fasting blood sugar of about 150 or less. - hgba1c  2. Morbid obesity ([place]) While she is having normal thyroid testing in the past, I would be glad to oblige rechecking this since this seems to be concern of other specialist and there is a family history of thyroid disease.  However, I do suspect that some of her difficulty losing weight lies in the fact that she does not keep a strict diet and drinks frequent sodas and sugary beverages.  The lack of physical activity also is contributing. - Thyroid Panel With TSH - Thyroid Peroxidase Antibody - Thyroglobulin antibody  3. Family history of thyroid disease - Thyroid Panel With TSH - Thyroid Peroxidase Antibody - Thyroglobulin antibody  4. Neuropathy Continue to follow-up with specialist as directed.  Continue gabapentin as prescribed.  5. Difficulty swallowing solids Uncertain etiology.  We discussed possibility of stricture versus uncontrolled GERD.  She is to continue her PPI as prescribed.  We discussed the risk of use of oral NSAIDs.  She voiced good understanding.  We will refer her back to her gastroenterologist for consideration of EGD and possible esophageal stretch  if  appropriate. - Ambulatory referral to Gastroenterology   Orders Placed This Encounter  Procedures  . hgba1c   No orders of the defined types were placed in this encounter.    [name], DO [place] [phone]

## 2019-07-03 NOTE — Patient Instructions (Addendum)
Sugar is high today.  I have changed your Glipizide to 10mg  twice daily. Monitor your blood sugar.  Goal is less than 150 but I don't want your sugar going below 80.  You had labs performed today.  You will be contacted with the results of the labs once they are available, usually in the next 3 business days for routine lab work.  If you have an active my chart account, they will be released to your MyChart.  If you prefer to have these labs released to you via telephone, please let us know.  If you had a pap smear or biopsy performed, expect to be contacted in about 7-10 days.

## 2019-07-05 ENCOUNTER — Ambulatory Visit: Payer: Medicare Other | Admitting: Physical Therapy

## 2019-07-05 LAB — THYROGLOBULIN ANTIBODY: Thyroglobulin Antibody: <1 IU/mL (ref 0.0–0.9)

## 2019-07-05 LAB — THYROID PEROXIDASE ANTIBODY: Thyroperoxidase Ab SerPl-aCnc: <9 IU/mL (ref 0–34)

## 2019-07-05 LAB — THYROID PANEL WITH TSH
Free Thyroxine Index: 2 (ref 1.2–4.9)
T3 Uptake Ratio: 31 % (ref 24–39)
T4, Total: 6.4 ug/dL (ref 4.5–12.0)
TSH: 1.79 u[IU]/mL (ref 0.450–4.500)

## 2019-07-06 ENCOUNTER — Other Ambulatory Visit: Payer: Self-pay | Admitting: Family Medicine

## 2019-07-06 DIAGNOSIS — G629 Polyneuropathy, unspecified: Secondary | ICD-10-CM

## 2019-07-10 ENCOUNTER — Ambulatory Visit: Payer: PRIVATE HEALTH INSURANCE | Attending: Chiropractic Medicine | Admitting: Physical Therapy

## 2019-07-19 ENCOUNTER — Ambulatory Visit: Payer: PRIVATE HEALTH INSURANCE | Admitting: Gastroenterology

## 2019-08-01 ENCOUNTER — Other Ambulatory Visit: Payer: Self-pay | Admitting: Family Medicine

## 2019-08-01 DIAGNOSIS — G629 Polyneuropathy, unspecified: Secondary | ICD-10-CM

## 2019-08-14 ENCOUNTER — Other Ambulatory Visit: Payer: Self-pay | Admitting: Internal Medicine

## 2019-08-21 ENCOUNTER — Other Ambulatory Visit: Payer: Self-pay | Admitting: Family Medicine

## 2019-08-22 NOTE — Telephone Encounter (Signed)
Ov 10/03/19 

## 2019-08-29 ENCOUNTER — Telehealth: Payer: Self-pay | Admitting: Family Medicine

## 2019-08-29 ENCOUNTER — Ambulatory Visit: Payer: Medicare Other | Admitting: Family

## 2019-08-29 ENCOUNTER — Emergency Department (HOSPITAL_COMMUNITY): Payer: Medicare Other

## 2019-08-29 ENCOUNTER — Encounter (HOSPITAL_COMMUNITY): Payer: Self-pay | Admitting: Emergency Medicine

## 2019-08-29 ENCOUNTER — Emergency Department (HOSPITAL_COMMUNITY)
Admission: EM | Admit: 2019-08-29 | Discharge: 2019-08-29 | Disposition: A | Discharge status: Home / Self Care | Payer: Medicare Other | Attending: Emergency Medicine | Admitting: Emergency Medicine

## 2019-08-29 ENCOUNTER — Other Ambulatory Visit: Payer: Self-pay

## 2019-08-29 DIAGNOSIS — R0600 Dyspnea, unspecified: Secondary | ICD-10-CM

## 2019-08-29 DIAGNOSIS — R0602 Shortness of breath: Secondary | ICD-10-CM | POA: Diagnosis not present

## 2019-08-29 DIAGNOSIS — Z7982 Long term (current) use of aspirin: Secondary | ICD-10-CM | POA: Diagnosis not present

## 2019-08-29 DIAGNOSIS — Z79899 Other long term (current) drug therapy: Secondary | ICD-10-CM | POA: Insufficient documentation

## 2019-08-29 DIAGNOSIS — F17210 Nicotine dependence, cigarettes, uncomplicated: Secondary | ICD-10-CM | POA: Diagnosis not present

## 2019-08-29 DIAGNOSIS — R0609 Other forms of dyspnea: Secondary | ICD-10-CM | POA: Diagnosis not present

## 2019-08-29 DIAGNOSIS — I10 Essential (primary) hypertension: Secondary | ICD-10-CM | POA: Diagnosis not present

## 2019-08-29 DIAGNOSIS — Z7984 Long term (current) use of oral hypoglycemic drugs: Secondary | ICD-10-CM | POA: Diagnosis not present

## 2019-08-29 DIAGNOSIS — E119 Type 2 diabetes mellitus without complications: Secondary | ICD-10-CM | POA: Insufficient documentation

## 2019-08-29 LAB — CBC WITH DIFFERENTIAL/PLATELET
Abs Immature Granulocytes: 0.04 10*3/uL (ref 0.00–0.07)
Basophils Absolute: 0.1 10*3/uL (ref 0.0–0.1)
Basophils Relative: 1 %
Eosinophils Absolute: 0.2 10*3/uL (ref 0.0–0.5)
Eosinophils Relative: 2 %
HCT: 43.9 % (ref 36.0–46.0)
Hemoglobin: 14.2 g/dL (ref 12.0–15.0)
Immature Granulocytes: 0 %
Lymphocytes Relative: 39 %
Lymphs Abs: 3.6 10*3/uL (ref 0.7–4.0)
MCH: 29.6 pg (ref 26.0–34.0)
MCHC: 32.3 g/dL (ref 30.0–36.0)
MCV: 91.6 fL (ref 80.0–100.0)
Monocytes Absolute: 0.7 10*3/uL (ref 0.1–1.0)
Monocytes Relative: 8 %
Neutro Abs: 4.7 10*3/uL (ref 1.7–7.7)
Neutrophils Relative %: 50 %
Platelets: 237 10*3/uL (ref 150–400)
RBC: 4.79 MIL/uL (ref 3.87–5.11)
RDW: 13.2 % (ref 11.5–15.5)
WBC: 9.4 10*3/uL (ref 4.0–10.5)
nRBC: 0 % (ref 0.0–0.2)

## 2019-08-29 LAB — URINALYSIS, ROUTINE W REFLEX MICROSCOPIC
Bilirubin Urine: NEGATIVE
Glucose, UA: NEGATIVE mg/dL
Hgb urine dipstick: NEGATIVE
Ketones, ur: NEGATIVE mg/dL
Leukocytes,Ua: NEGATIVE
Nitrite: NEGATIVE
Protein, ur: NEGATIVE mg/dL
Specific Gravity, Urine: 1.016 (ref 1.005–1.030)
pH: 6 (ref 5.0–8.0)

## 2019-08-29 LAB — BASIC METABOLIC PANEL
Anion gap: 9 (ref 5–15)
BUN: 12 mg/dL (ref 8–23)
CO2: 29 mmol/L (ref 22–32)
Calcium: 9 mg/dL (ref 8.9–10.3)
Chloride: 99 mmol/L (ref 98–111)
Creatinine, Ser: 0.88 mg/dL (ref 0.44–1.00)
GFR calc Af Amer: >60 mL/min (ref >60)
GFR calc non Af Amer: >60 mL/min (ref >60)
Glucose, Bld: 214 mg/dL — ABNORMAL HIGH (ref 70–99)
Potassium: 3.5 mmol/L (ref 3.5–5.1)
Sodium: 137 mmol/L (ref 135–145)

## 2019-08-29 LAB — TROPONIN I (HIGH SENSITIVITY): Troponin I (High Sensitivity): 6 ng/L (ref <18)

## 2019-08-29 LAB — BRAIN NATRIURETIC PEPTIDE: B Natriuretic Peptide: 92 pg/mL (ref 0.0–100.0)

## 2019-08-29 MED ORDER — ACETAMINOPHEN 500 MG PO TABS
1000.0000 mg | ORAL_TABLET | Freq: Once | ORAL | Status: AC
  Administered 2019-08-29: 1000 mg via ORAL
  Filled 2019-08-29: qty 2

## 2019-08-29 MED ORDER — METHYLPREDNISOLONE SODIUM SUCC 125 MG IJ SOLR
125.0000 mg | Freq: Once | INTRAMUSCULAR | Status: AC
  Administered 2019-08-29: 125 mg via INTRAVENOUS
  Filled 2019-08-29: qty 2

## 2019-08-29 MED ORDER — ALBUTEROL SULFATE HFA 108 (90 BASE) MCG/ACT IN AERS
2.0000 | INHALATION_SPRAY | Freq: Once | RESPIRATORY_TRACT | Status: AC
  Administered 2019-08-29: 2 via RESPIRATORY_TRACT
  Filled 2019-08-29: qty 6.7

## 2019-08-29 NOTE — ED Triage Notes (Signed)
Pt reports shortness of breath and bp high x3 days. Pt reports is currently taking bp medication but is "not working." pt denies chest pain, headache.

## 2019-08-29 NOTE — Telephone Encounter (Signed)
Pt needs to speak with someone regarding her BP. Says her BP this morning was 184/159 and the other day when she checked it, it was 214/104.

## 2019-08-29 NOTE — Telephone Encounter (Signed)
Per Epic - pt arrived at ER ( visit here today will be cancelled )

## 2019-08-29 NOTE — Discharge Instructions (Signed)
Continue taking all of your home medications as prescribed.  Check your blood pressure once daily, I typically recommend 1 to 2 hours after taking your blood pressure medication.  Use the inhaler 1 to 2 puffs every 4-6 hours as needed for shortness of breath.  I would recommend that you quit smoking.  Call your primary care provider today to schedule close follow-up appointment for reevaluation sometime this week or next week.  They may want to work you up for obstructive sleep apnea (OSA) which could be a reason why you are having difficulty sleeping and difficulty breathing as well as elevated blood pressures.  You can also ask them if it is okay for you to take extra doses of your blood pressure medication which you have been doing.  I would only do this under the recommendation.  Return to the emergency department if any concerning signs or symptoms develop such as fevers, severe shortness of breath or chest pains, persistent vomiting or loss of consciousness.

## 2019-08-29 NOTE — Telephone Encounter (Signed)
Appt made

## 2019-08-29 NOTE — ED Provider Notes (Signed)
[facility] EMERGENCY DEPARTMENT Provider Note   CSN: [id number] Arrival date & time: 08/29/19  1013     History Chief Complaint  Patient presents with  . Shortness of Breath    Donna Rogers is a 69 y.o. female with history of rheumatoid arthritis, depression, diabetes mellitus, GERD, hyperlipidemia, hypertension presents for evaluation of acute onset, progressively worsening shortness of breath for 3 weeks.  She notes significant dyspnea on exertion, orthopnea and PND.  Has experienced leg swelling intermittently over the last few years reports she has had some over the last couple of weeks.  Also reports 20 pound weight gain in the last couple of months.  Denies fever.  Has had dry cough over the last few weeks as well.  No known Covid exposures.  Denies abdominal pain, nausea, vomiting, or diarrhea.  No urinary symptoms.  She is primarily concerned as she has noticed over the last few days her blood pressure has been elevated.  States that her blood pressure is typically pretty well controlled with systolic in the 357S.  2 days ago she checked her blood pressure which was 214/104.  Today around 8:20 AM she checked her blood pressure before taking her medications and it was 184/159.  She rechecked it at 9:00 and it was 190/121.  She states that she took an extra dose of her metoprolol today out of concern for her hypertension.  Several days ago she had some substernal chest pain which she states resolved after forceful coughing.  She has not had any chest pain since then.  She is a current smoker of approximately a pack of cigarettes daily.  She denies recent travel or surgeries, no hemoptysis, no prior history of DVT or PE.  Not on hormone replacement therapy.  The history is provided by the patient.       Past Medical History:  Diagnosis Date  . Anemia   . Arthritis    rheumatoid  . Depression   . Diabetes ([place])   . Diabetes mellitus without complication ([place])   . GERD  (gastroesophageal reflux disease)   . Headache   . Hyperlipidemia   . Hyperplastic rectal polyp 10/21/2015  . Hypertension    pt. denies  . Lymphocytic colitis   . Shortness of breath dyspnea    with exertion    Patient Active Problem List   Diagnosis Date Noted  . Hyperlipidemia associated with type 2 diabetes mellitus ([place]) 12/28/2018  . Chronic pain of right knee 12/28/2018  . Tobacco abuse 04/10/2016  . Lymphocytic colitis 12/03/2015  . DM2 (diabetes mellitus, type 2) ([place]) 10/11/2015  . Depression 06/17/2015  . Fatigue 06/17/2015  . Weakness 06/03/2015  . Hypertension associated with diabetes ([place])   . OBESITY 01/21/2009  . EROSIVE ESOPHAGITIS 01/21/2009  . GASTROESOPHAGEAL REFLUX DISEASE, CHRONIC 01/21/2009  . ARTHRITIS, RHEUMATOID 01/21/2009  . TENDINITIS 01/21/2009  . EDEMA 01/21/2009    Past Surgical History:  Procedure Laterality Date  . BREAST LUMPECTOMY Right 2018  . BREAST LUMPECTOMY WITH RADIOACTIVE SEED LOCALIZATION Left 03/03/2016   Procedure: BREAST LUMPECTOMY WITH RADIOACTIVE SEED LOCALIZATION;  Surgeon: Coralie Keens, MD;  Location: [facility];  Service: General;  Laterality: Left;  . COLONOSCOPY  08/2002   RMR: internal hemorrhoids  . COLONOSCOPY N/A 10/21/2015   Procedure: COLONOSCOPY;  Surgeon: Daneil Dolin, MD;  [facility];  Service: Endoscopy;  Laterality: N/A;  250 - moved to 2:35 - office to notify pt  . CYST REMOVAL TRUNK    .  ESOPHAGOGASTRODUODENOSCOPY  08/2002   RMR: nonerosive reflux esophagitis, hiatal hernia  . TONSILLECTOMY       OB History    Gravida  1   Para  1   Term  1   Preterm      AB      Living  1     SAB      TAB      Ectopic      Multiple      Live Births              Family History  Problem Relation Age of Onset  . Alzheimer's disease Mother   . Lung cancer Mother   . Diabetes Father   . Colon cancer Neg Hx   . Stomach cancer Neg Hx     Social History   Tobacco Use  . Smoking  status: Current Every Day Smoker    Packs/day: 0.75    Years: 46.00    Pack years: 34.50    Types: Cigarettes  . Smokeless tobacco: Current User  . Tobacco comment: less than a pack daily. States she cannot afford Chantix.  Substance Use Topics  . Alcohol use: No    Alcohol/week: 0.0 standard drinks  . Drug use: No    Home Medications Prior to Admission medications   Medication Sig Start Date End Date Taking? Authorizing Provider  budesonide (ENTOCORT EC) 3 MG 24 hr capsule TAKE 3 CAPSULES ONCE DAILY 08/14/19   Gatha Mayer, MD  diphenoxylate-atropine (LOMOTIL) 2.5-0.025 MG tablet TAKE 1 OR 2 TABLETS FOUR TIMES DAILY AS NEEDED 08/14/19   Gatha Mayer, MD  aspirin 81 MG tablet Take 81 mg by mouth daily.    [provider]  atenolol (TENORMIN) 25 MG tablet TAKE 1 TABLET BY MOUTH DAILY. 08/22/19   Ronnie Doss M, DO  cholecalciferol (VITAMIN D) 1000 UNITS tablet Take 1,000 Units by mouth daily.    [provider]  citalopram (CELEXA) 40 MG tablet TAKE 1 TABLET EVERY DAY (Needs to be seen) 08/22/19   [name], DO  diclofenac (VOLTAREN) 75 MG EC tablet Take 75 mg by mouth daily.    [provider]  etanercept (ENBREL) 50 MG/ML injection Inject 50 mg into the skin once a week.    [provider]  gabapentin (NEURONTIN) 600 MG tablet TAKE (2) TABLETS THREE TIMES DAILY. 08/01/19   [name], DO  glipiZIDE (GLUCOTROL) 10 MG tablet Take 1 tablet (10 mg total) by mouth 2 (two) times daily before a meal. 07/03/19   Ronnie Doss M, DO  HYDROcodone-acetaminophen (NORCO/VICODIN) 5-325 MG per tablet Take 1 tablet by mouth every 6 (six) hours as needed. Patient taking differently: Take 1 tablet by mouth every 6 (six) hours as needed for moderate pain.  07/19/14   Lysbeth Penner, FNP  influenza vaccine adjuvanted (FLUAD QUADRIVALENT) 0.5 ML injection Fluad Quad 2020-2021(74yrup)(PF) 60 mcg (15 mcg x 4)/0.558mIM syringe    [provider]  omeprazole (PRILOSEC) 20 MG capsule TAKE (1) CAPSULE DAILY 06/19/19   GoRonnie Doss, DO  simvastatin (ZOCOR) 40 MG tablet Take 1 tablet (40 mg total) by mouth daily. Needs visit for additional refills 06/28/19   GoRonnie Doss, DO  Tdap (BOOSTRIX) 5-2.5-18.5 LF-MCG/0.5 injection Boostrix Tdap 2.5 Lf unit-8 mcg-5 Lf/0.5 mL intramuscular syringe    [provider]  vitamin C (ASCORBIC ACID) 500 MG tablet Take 500 mg by mouth daily.    [provider]  Zoster Vaccine Adjuvanted [facility]) injection Shingrix (PF) 50 mcg/0.5 mL intramuscular suspension, kit    [provider]    Allergies    Ciprofloxacin hcl  Review of Systems   Review of Systems  Constitutional: Negative for chills and fever.  Eyes: Negative for visual disturbance.  Respiratory: Positive for cough and shortness of breath.   Cardiovascular: Positive for chest pain (resolved) and leg swelling.  Gastrointestinal: Negative for abdominal pain, diarrhea, nausea and vomiting.  Neurological: Negative for headaches.  All other systems reviewed and are negative.   Physical Exam Updated Vital Signs BP (!) 158/73 (BP Location: Left Arm)   Pulse 66   Temp 98.4 F (36.9 C) (Oral)   Resp 18   Ht 5' 2"  (1.575 m)   Wt 101.1 kg   SpO2 96%   BMI 40.77 kg/m   Physical Exam Vitals and nursing note reviewed.  Constitutional:      General: She is not in acute distress.    Appearance: She is well-developed.     Comments: Appears anxious  HENT:     Head: Normocephalic and atraumatic.  Eyes:     General:        Right eye: No discharge.        Left eye: No discharge.     Conjunctiva/sclera: Conjunctivae normal.  Neck:     Vascular: No JVD.     Trachea: No tracheal deviation.  Cardiovascular:     Rate and Rhythm: Normal rate and regular rhythm.     Pulses: Normal pulses.     Comments: 2+ radial and DP/PT pulses bilaterally.  Bevelyn Buckles' sign absent bilaterally.  Trace pitting  edema of the bilateral lower extremities. Pulmonary:     Effort: Tachypnea present.     Comments: Speaking in full sentences without difficulty.  Globally diminished breath sounds, scattered expiratory wheezes. Abdominal:     General: Bowel sounds are normal. There is no distension.     Palpations: Abdomen is soft.     Tenderness: There is no abdominal tenderness. There is no guarding.  Musculoskeletal:     Cervical back: Normal range of motion and neck supple.     Right lower leg: No tenderness.     Left lower leg: No tenderness.  Skin:    General: Skin is dry.     Findings: No erythema.  Neurological:     Mental Status: She is alert.  Psychiatric:        Behavior: Behavior normal.     ED Results / Procedures / Treatments   Labs (all labs ordered are listed, but only abnormal results are displayed) Labs Reviewed  BASIC METABOLIC PANEL - Abnormal; Notable for the following components:      Result Value   Glucose, Bld 214 (*)    All other components within normal limits  URINALYSIS, ROUTINE W REFLEX MICROSCOPIC - Abnormal; Notable for the following components:   APPearance HAZY (*)    All other components within normal limits  CBC WITH DIFFERENTIAL/PLATELET  BRAIN NATRIURETIC PEPTIDE  TROPONIN I (HIGH SENSITIVITY)    EKG EKG Interpretation  Date/Time:  Tuesday August 29 2019 10:27:13 EST Ventricular Rate:  69 PR Interval:  168 QRS Duration: 74 QT Interval:  450 QTC Calculation: 482 R Axis:   -3 Text Interpretation: Normal sinus rhythm Normal ECG Confirmed by Nat Christen [phone]) on 08/29/2019 1:02:03 PM   Radiology DG Chest 2 View  Result Date: 08/29/2019 CLINICAL DATA:  Shortness of breath and  elevated blood pressure for 3 days. EXAM: CHEST - 2 VIEW COMPARISON:  PA and lateral chest 07/27/2018.  CT chest 07/09/2018. FINDINGS: The lungs are clear. Heart size is normal. Atherosclerosis noted. No pneumothorax or pleural fluid. No acute bony abnormality. Thoracic  spondylosis noted. IMPRESSION: No acute disease. Atherosclerosis. Electronically Signed   By: Inge Rise M.D.   On: 08/29/2019 10:46    Procedures Procedures (including critical care time)  Medications Ordered in ED Medications  methylPREDNISolone sodium succinate (SOLU-MEDROL) 125 mg/2 mL injection 125 mg (125 mg Intravenous Given 08/29/19 1150)  albuterol (VENTOLIN HFA) 108 (90 Base) MCG/ACT inhaler 2 puff (2 puffs Inhalation Given 08/29/19 1148)  acetaminophen (TYLENOL) tablet 1,000 mg (1,000 mg Oral Given 08/29/19 1219)    ED Course  I have reviewed the triage vital signs and the nursing notes.  Pertinent labs & imaging results that were available during my care of the patient were reviewed by me and considered in my medical decision making (see chart for details).    MDM Rules/Calculators/A&P                      Patient presenting for evaluation of hypertension for a few days, dyspnea on exertion for several weeks.  She is afebrile, initially hypertensive and mildly tachypneic but resting comfortably on initial assessment.  She is nontoxic overall anxious in appearance.  Soft expiratory wheezes noted on auscultation of the lungs.  Chest x-ray shows no acute cardiopulmonary abnormalities with no cardiomegaly, edema or consolidation.  No pneumothorax.  No evidence of pneumonia.  EKG shows normal sinus rhythm with no acute ischemic abnormalities.  She had transient atypical chest pain a few days ago and none since then.  A single troponin was obtained which was negative.  Given her pain was several days ago I do not feel she requires serial troponins and I doubt ACS/MI.  She was ambulated in the ED with O2 saturations 96%.  Doubt PE, cardiac tamponade, dissection, esophageal rupture.  BNP is within normal limits, low suspicion of CHF at this time given no concerning x-ray findings and only mild peripheral edema.  She has no history of heart failure.  Remainder of lab work reviewed by me  shows no leukocytosis, no anemia, no renal insufficiency, no metabolic derangements.  Her glucose is elevated at 214 but no evidence of DKA.  She does note to me that she has been having difficulty sleeping for several months, could be OSA and I encouraged her to follow-up with her PCP for evaluation of this.  She was given albuterol and Solu-Medrol in the ED on reevaluation reports that she is feeling better.  We will hold off on sending her home with a course of prednisone due to her history of diabetes and hyperglycemia today on blood work.  Her blood pressure has improved on reevaluation without any interventions in the ED.  No evidence of hypertensive emergency.  She will call her PCP today to schedule close follow-up.  Offered outpatient Covid testing but she declined which I think is reasonable as she does not have any infectious symptoms.  Discussed strict ED return precautions. Patient verbalized understanding of and agreement with plan and is safe for discharge home at this time.  Discussed with Dr. Lacinda Axon who agrees with assessment and plan at this time. Final Clinical Impression(s) / ED Diagnoses Final diagnoses:  DOE (dyspnea on exertion)  Hypertension, unspecified type    Rx / DC Orders ED Discharge Orders  None       Debroah Baller 08/29/19 1316    Nat Christen, MD 08/30/19 1001

## 2019-08-29 NOTE — Telephone Encounter (Signed)
Called pt - bp while talking was 190/121  Only takes Atenolol 25 one a day = no other BP meds ( taking last night at 5- 6 pm)  -- switched to morning - this morning and took last 8:20  She is having a horrible HA. Spoke with on-call provider: Since her BP is this high and not coming down AND she is symptomatic ( HA)  She was advised to to straight to the ER for work up

## 2019-08-31 ENCOUNTER — Ambulatory Visit (HOSPITAL_COMMUNITY)
Admission: RE | Admit: 2019-08-31 | Discharge: 2019-08-31 | Disposition: A | Discharge status: Home / Self Care | Payer: Medicare Other | Source: Ambulatory Visit | Attending: Acute Care | Admitting: Acute Care

## 2019-08-31 ENCOUNTER — Other Ambulatory Visit: Payer: Self-pay

## 2019-08-31 ENCOUNTER — Telehealth: Payer: Self-pay | Admitting: Pulmonary Disease

## 2019-08-31 DIAGNOSIS — Z87891 Personal history of nicotine dependence: Secondary | ICD-10-CM | POA: Diagnosis not present

## 2019-08-31 DIAGNOSIS — Z122 Encounter for screening for malignant neoplasm of respiratory organs: Secondary | ICD-10-CM | POA: Insufficient documentation

## 2019-08-31 DIAGNOSIS — F17210 Nicotine dependence, cigarettes, uncomplicated: Secondary | ICD-10-CM

## 2019-08-31 DIAGNOSIS — F17200 Nicotine dependence, unspecified, uncomplicated: Secondary | ICD-10-CM | POA: Insufficient documentation

## 2019-08-31 NOTE — Telephone Encounter (Signed)
Attempted to call pt but unable to reach. Left message for pt to return call. 

## 2019-08-31 NOTE — Telephone Encounter (Signed)
Received a call from [facility] with [facility] in regards to pt's CT results.  IMPRESSION: 1. Lung-RADS 4B, suspicious. Enlarging clustered solid nodularity in the peripheral right upper lobe, which could be due to a slow growing neoplasm or worsening chronic infection such as MAI. Consider PET-CT for further characterization. Additional imaging evaluation or consultation with Pulmonology or Thoracic Surgery recommended. 2. Small hiatal hernia.  Due to Judson Roch not being at office, routing to [place].

## 2019-08-31 NOTE — Telephone Encounter (Signed)
Please schedule the patient with an APP or Dr. Loanne Drilling to further review these results.  We will also route this message to Eric Form as FYI as patient is in the lung cancer screening program as well as to Dr. Loanne Drilling is this is a patient of hers.  Wyn Quaker, FNP

## 2019-09-01 ENCOUNTER — Ambulatory Visit (INDEPENDENT_AMBULATORY_CARE_PROVIDER_SITE_OTHER): Payer: Medicare Other | Admitting: Family Medicine

## 2019-09-01 ENCOUNTER — Encounter: Payer: Self-pay | Admitting: Family Medicine

## 2019-09-01 VITALS — BP 155/80 | HR 70 | Temp 98.7°F | Ht 62.0 in | Wt 221.0 lb

## 2019-09-01 DIAGNOSIS — F410 Panic disorder [episodic paroxysmal anxiety] without agoraphobia: Secondary | ICD-10-CM | POA: Diagnosis not present

## 2019-09-01 DIAGNOSIS — F411 Generalized anxiety disorder: Secondary | ICD-10-CM | POA: Diagnosis not present

## 2019-09-01 DIAGNOSIS — I10 Essential (primary) hypertension: Secondary | ICD-10-CM

## 2019-09-01 DIAGNOSIS — F329 Major depressive disorder, single episode, unspecified: Secondary | ICD-10-CM

## 2019-09-01 DIAGNOSIS — F32A Depression, unspecified: Secondary | ICD-10-CM

## 2019-09-01 MED ORDER — BUSPIRONE HCL 5 MG PO TABS: ORAL_TABLET | ORAL | 0 refills | Status: AC

## 2019-09-01 MED ORDER — LOSARTAN POTASSIUM 50 MG PO TABS: 50.0000 mg | ORAL_TABLET | Freq: Every day | ORAL | 0 refills | Status: DC

## 2019-09-01 NOTE — Telephone Encounter (Signed)
Thank you both so much for handling this!

## 2019-09-01 NOTE — Patient Instructions (Signed)
We will start losartan 50 mg daily for your blood pressure.  Keep an eye on your blood pressures and record these.  Bring this to your next visit in 1 week.  We will check your kidney function at your next visit in 1 week.  I have also sent in buspirone.  This will help with anxiety and depression.  Start with just 1 tablet twice daily for 1 week.  After 1 week you can increase to 1-1/2 tablets twice daily.  If needed after another week you can increase to 2 tablets twice daily.  We will readdress your anxiety and depression at our visit in February.  I am also going to have a counselor reach out to you via telephone.

## 2019-09-01 NOTE — Telephone Encounter (Signed)
Thank you for handling this and getting the patient scheduled.Aaron Edelman

## 2019-09-01 NOTE — Telephone Encounter (Signed)
LMTCB

## 2019-09-01 NOTE — Telephone Encounter (Signed)
Spoke with patient. She preferred the 12pm slot. She has scheduled for 09/06/19 at 12pm. She is aware of our address and location.  Will route to Aaron Edelman and Judson Roch so they are aware.

## 2019-09-01 NOTE — Progress Notes (Signed)
Subjective: CC: Emergency department follow-up PCP: [name], DO EXB:[id number] Donna Rogers is Donna 69 y.o. female presenting to clinic today for:  1.  ED follow-up for accelerated hypertension Patient was evaluated emergency department on 08/29/2019 for dyspnea on exertion.  She was noted to be hypertensive.  Eval for hypertensive emergency was pursued.  Chest x-ray was negative.  She was given IV steroids and an albuterol inhaler.  Oxygen remained within normal limits.  She had no other infectious symptoms and therefore no Covid testing was performed.  She reports increased anxiety and depression.  She has had some shortness of breath during these episodes.  She notes that blood pressures have consistently been above 010U systolic.  She has not had such severe hypertension as she did prior to going to the emergency department though.  She reports compliance with Celexa and atenolol.   ROS: Per HPI  Allergies  Allergen Reactions  . Ciprofloxacin Hcl Rash   Past Medical History:  Diagnosis Date  . Anemia   . Arthritis    rheumatoid  . Depression   . Diabetes ([place])   . Diabetes mellitus without complication ([place])   . GERD (gastroesophageal reflux disease)   . Headache   . Hyperlipidemia   . Hyperplastic rectal polyp 10/21/2015  . Hypertension    pt. denies  . Lymphocytic colitis   . Shortness of breath dyspnea    with exertion    Current Outpatient Medications:  .  budesonide (ENTOCORT EC) 3 MG 24 hr capsule, TAKE 3 CAPSULES ONCE DAILY, Disp: 90 capsule, Rfl: 0 .  diphenoxylate-atropine (LOMOTIL) 2.5-0.025 MG tablet, TAKE 1 OR 2 TABLETS FOUR TIMES DAILY AS NEEDED, Disp: 90 tablet, Rfl: 0 .  aspirin 81 MG tablet, Take 81 mg by mouth daily., Disp: , Rfl:  .  atenolol (TENORMIN) 25 MG tablet, TAKE 1 TABLET BY MOUTH DAILY., Disp: 90 tablet, Rfl: 0 .  cholecalciferol (VITAMIN D) 1000 UNITS tablet, Take 1,000 Units by mouth daily., Disp: , Rfl:  .  citalopram (CELEXA) 40 MG  tablet, TAKE 1 TABLET EVERY DAY (Needs to be seen), Disp: 90 tablet, Rfl: 0 .  diclofenac (VOLTAREN) 75 MG EC tablet, Take 75 mg by mouth daily., Disp: , Rfl:  .  etanercept (ENBREL) 50 MG/ML injection, Inject 50 mg into the skin once Donna week., Disp: , Rfl:  .  gabapentin (NEURONTIN) 600 MG tablet, TAKE (2) TABLETS THREE TIMES DAILY., Disp: 120 tablet, Rfl: 1 .  glipiZIDE (GLUCOTROL) 10 MG tablet, Take 1 tablet (10 mg total) by mouth 2 (two) times daily before Donna meal., Disp: 180 tablet, Rfl: 0 .  HYDROcodone-acetaminophen (NORCO/VICODIN) 5-325 MG per tablet, Take 1 tablet by mouth every 6 (six) hours as needed. (Patient taking differently: Take 1 tablet by mouth every 6 (six) hours as needed for moderate pain. ), Disp: 60 tablet, Rfl: 0 .  influenza vaccine adjuvanted (FLUAD QUADRIVALENT) 0.5 ML injection, Fluad Quad 2020-2021(55yrup)(PF) 60 mcg (15 mcg x 4)/0.528mIM syringe, Disp: , Rfl:  .  omeprazole (PRILOSEC) 20 MG capsule, TAKE (1) CAPSULE DAILY, Disp: 90 capsule, Rfl: 0 .  simvastatin (ZOCOR) 40 MG tablet, Take 1 tablet (40 mg total) by mouth daily. Needs visit for additional refills, Disp: 30 tablet, Rfl: 0 .  Tdap (BOOSTRIX) 5-2.5-18.5 LF-MCG/0.5 injection, Boostrix Tdap 2.5 Lf unit-8 mcg-5 Lf/0.5 mL intramuscular syringe, Disp: , Rfl:  .  vitamin C (ASCORBIC ACID) 500 MG tablet, Take 500 mg by mouth daily., Disp: , Rfl:  .  Zoster Vaccine Adjuvanted (SHINGRIX) injection, Shingrix (PF) 50 mcg/0.5 mL intramuscular suspension, kit, Disp: , Rfl:  Social History   Socioeconomic History  . Marital status: Single    Spouse name: Not on file  . Number of children: 1  . Years of education: Not on file  . Highest education level: Not on file  Occupational History  . Occupation: taking care of parents  Tobacco Use  . Smoking status: Current Every Day Smoker    Packs/day: 0.75    Years: 46.00    Pack years: 34.50    Types: Cigarettes  . Smokeless tobacco: Current User  . Tobacco comment:  less than Donna pack daily. States she cannot afford Chantix.  Substance and Sexual Activity  . Alcohol use: No    Alcohol/week: 0.0 standard drinks  . Drug use: No  . Sexual activity: Not Currently  Other Topics Concern  . Not on file  Social History Narrative   The patient is divorced she has 1 sonWho is married that she has Donna grandchild.   She is not employed though the chart indicates she has cared for her parents   She does not use alcohol or drugs she drinks 3-4 caffeinated beverages daily   She is Donna smoker   Social Determinants of Radio broadcast assistant Strain:   . Difficulty of Paying Living Expenses: Not on file  Food Insecurity:   . Worried About Charity fundraiser in the Last Year: Not on file  . Ran Out of Food in the Last Year: Not on file  Transportation Needs:   . Lack of Transportation (Medical): Not on file  . Lack of Transportation (Non-Medical): Not on file  Physical Activity:   . Days of Exercise per Week: Not on file  . Minutes of Exercise per Session: Not on file  Stress:   . Feeling of Stress : Not on file  Social Connections:   . Frequency of Communication with Friends and Family: Not on file  . Frequency of Social Gatherings with Friends and Family: Not on file  . Attends Religious Services: Not on file  . Active Member of Clubs or Organizations: Not on file  . Attends Archivist Meetings: Not on file  . Marital Status: Not on file  Intimate Partner Violence:   . Fear of Current or Ex-Partner: Not on file  . Emotionally Abused: Not on file  . Physically Abused: Not on file  . Sexually Abused: Not on file   Family History  Problem Relation Age of Onset  . Alzheimer's disease Mother   . Lung cancer Mother   . Diabetes Father   . Colon cancer Neg Hx   . Stomach cancer Neg Hx     Objective: Office vital signs reviewed. BP (!) 155/80   Pulse 70   Temp 98.7 F (37.1 C) (Temporal)   Ht 5' 2"  (1.575 m)   Wt 221 lb (100.2 kg)    SpO2 96%   BMI 40.42 kg/m   Physical Examination:  General: Awake, alert, well nourished, No acute distress HEENT: Normal, sclera somewhat injected Cardio: regular rate and rhythm, S1S2 heard, no murmurs appreciated Pulm: clear to auscultation bilaterally, no wheezes, rhonchi or rales; normal work of breathing on room air Extremities: warm, well perfused, No edema, cyanosis or clubbing; +2 pulses bilaterally Psych: Mood depressed, patient tearful, appears anxious Depression screen [facility] 2/9 09/01/2019 07/03/2019 12/28/2018  Decreased Interest 1 0 0  Down, Depressed, Hopeless 1 0  0  PHQ - 2 Score 2 0 0  Altered sleeping 1 0 3  Tired, decreased energy 1 0 3  Change in appetite 0 0 0  Feeling bad or failure about yourself  1 0 0  Trouble concentrating 1 0 1  Moving slowly or fidgety/restless 0 0 0  Suicidal thoughts 0 0 0  PHQ-9 Score 6 0 7  Difficult doing work/chores - - Somewhat difficult  Some recent data might be hidden   GAD 7 : Generalized Anxiety Score 09/01/2019 05/16/2018 10/10/2015  Nervous, Anxious, on Edge 3 0 3  Control/stop worrying 2 0 3  Worry too much - different things 2 0 3  Trouble relaxing 1 3 3   Restless 0 0 0  Easily annoyed or irritable 2 0 1  Afraid - awful might happen 1 0 3  Total GAD 7 Score 11 3 16   Anxiety Difficulty Somewhat difficult Not difficult at all Extremely difficult     Assessment/ Plan: 69 y.o. female   1. Uncontrolled hypertension Blood pressure persistently elevated.  We discussed this may be impacted by ongoing anxiety and panic.  Her previous blood pressures have been controlled.  However, given ongoing hypertension, will initiate losartan 50 mg daily.  We will see her back in 1 week for repeat blood pressure and labs.  We will plan to perform Donna corticosteroid injection at that time of her knee on the right. - losartan (COZAAR) 50 MG tablet; Take 1 tablet (50 mg total) by mouth daily.  Dispense: 90 tablet; Refill: 0  2. Generalized  anxiety disorder with panic attacks Not well controlled.  Will add buspirone to Celexa.  I discussed with her how to increase this each week if needed.  She will follow-up with me regarding this in about 4 weeks. - busPIRone (BUSPAR) 5 MG tablet; Take 1 tablet (5 mg total) by mouth 2 (two) times daily for 7 days, THEN 1.5 tablets (7.5 mg total) 2 (two) times daily for 7 days, THEN 2 tablets (10 mg total) 2 (two) times daily for 21 days.  Dispense: 120 tablet; Refill: 0  3. Depressive disorder - busPIRone (BUSPAR) 5 MG tablet; Take 1 tablet (5 mg total) by mouth 2 (two) times daily for 7 days, THEN 1.5 tablets (7.5 mg total) 2 (two) times daily for 7 days, THEN 2 tablets (10 mg total) 2 (two) times daily for 21 days.  Dispense: 120 tablet; Refill: 0   No orders of the defined types were placed in this encounter.  No orders of the defined types were placed in this encounter.    [name], DO [place] [phone]

## 2019-09-06 ENCOUNTER — Other Ambulatory Visit: Payer: Self-pay

## 2019-09-06 ENCOUNTER — Encounter: Payer: Self-pay | Admitting: Acute Care

## 2019-09-06 ENCOUNTER — Ambulatory Visit (INDEPENDENT_AMBULATORY_CARE_PROVIDER_SITE_OTHER): Payer: PRIVATE HEALTH INSURANCE | Admitting: Acute Care

## 2019-09-06 DIAGNOSIS — R519 Headache, unspecified: Secondary | ICD-10-CM | POA: Diagnosis not present

## 2019-09-06 DIAGNOSIS — R9389 Abnormal findings on diagnostic imaging of other specified body structures: Secondary | ICD-10-CM

## 2019-09-06 DIAGNOSIS — R0600 Dyspnea, unspecified: Secondary | ICD-10-CM | POA: Diagnosis not present

## 2019-09-06 DIAGNOSIS — R1310 Dysphagia, unspecified: Secondary | ICD-10-CM | POA: Diagnosis not present

## 2019-09-06 DIAGNOSIS — F17210 Nicotine dependence, cigarettes, uncomplicated: Secondary | ICD-10-CM | POA: Diagnosis not present

## 2019-09-06 DIAGNOSIS — Z720 Tobacco use: Secondary | ICD-10-CM

## 2019-09-06 DIAGNOSIS — I159 Secondary hypertension, unspecified: Secondary | ICD-10-CM

## 2019-09-06 DIAGNOSIS — K449 Diaphragmatic hernia without obstruction or gangrene: Secondary | ICD-10-CM

## 2019-09-06 DIAGNOSIS — R0609 Other forms of dyspnea: Secondary | ICD-10-CM

## 2019-09-06 DIAGNOSIS — J440 Chronic obstructive pulmonary disease with acute lower respiratory infection: Secondary | ICD-10-CM

## 2019-09-06 MED ORDER — DOXYCYCLINE HYCLATE 100 MG PO TABS: 100.0000 mg | ORAL_TABLET | Freq: Two times a day (BID) | ORAL | 0 refills | Status: DC

## 2019-09-06 NOTE — Patient Instructions (Addendum)
  It was good talking with you today We will prescribe Doxycycline 100 mg twice daily with a full glass of water for 7 days Follow up CT Chest Scan in 6-8 weeks to re-evaluate the area. Someone will call you to get this scheduled We will call you with the results Keep working on quitting smoking Use rescue inhaler provided in ED as prescribed Referral to Pulmonology Follow up with PCP about swallow difficulties and high blood pressure   as is scheduled 09/08/2019 We will call you with the results of the follow up scan, and we will plan any follow up care at that time.  Please contact office for sooner follow up if symptoms do not improve or worsen or seek emergency care

## 2019-09-06 NOTE — Progress Notes (Signed)
Pt. Is scheduled for an appointment with me today to review these results and to plan follow up treatment/ imaging. Please place on tickle list for continued follow up based on results of diagnostics. Thanks so much

## 2019-09-06 NOTE — Progress Notes (Addendum)
Virtual Visit via Telephone Note  I connected with Donna Rogers on 09/06/19 at 12:00 PM EST by telephone and verified that I am speaking with the correct person using two identifiers.  Location: Patient: At home Provider: [place], [place], [place], [address]   I discussed the limitations, risks, security and privacy concerns of performing an evaluation and management service by telephone and the availability of in person appointments. I also discussed with the patient that there may be a patient responsible charge related to this service. The patient expressed understanding and agreed to proceed.  Synopsis 69 year old current every day smoker ( 34.5 pack year smoking history) followed by the lung screening program. She is  here for follow up of a Lung RADS 4 B scan .    History of Present Illness: Pt presents for follow up. I explained that her low dose CT was read as a Lung RADS 4 B wich  indicates suspicious findings for which additional diagnostic testing and or tissue sampling is recommended.I reviewed her scan with Dr. Erskine Emery. We agreed that the area of concern could be infectious / inflammatory. Plan is to treat with Doxycycline x 1 week and then re-scan in 6-8 weeks. Subsequently she had a recent ED visit at [facility] on 08/29/2019. She was sent by her PCP for BP of 190/121 and head ache. She was also noted to have DOE . She was treated with Solumedrol IV, Given a ventolin rescue inhaler, and tylenol. She was asked to follow up as an outpatient with her PCP. She has an appointment this Friday 09/08/2019. Pt. States she is coughing up clear to gray secretions, her baseline  She denies any fever but does note a slight  worsening in her breathing. She continues to smoke daily. She states smoking is her one remaining pleasure. She states she has noted that she has trouble swallowing. I have asked her to let her PCP know, so that she can order a swallow study if she feels this  is appropriate.CT does show a small hiatal hernia. Additionally, I feel she would benefit from an OSA evaluation, and referral to pulmonology.  She denies any COVID contacts or exposures    Observations/Objective: 08/31/2019 Low Dose CT Chest Lung-RADS 4B, suspicious. Enlarging clustered solid nodularity in the peripheral right upper lobe, which could be due to a slow growing neoplasm or worsening chronic infection such as MAI. Consider PET-CT for further characterization. Additional imaging evaluation or consultation with Pulmonology or Thoracic Surgery recommended. 2. Small hiatal hernia.  Assessment and Plan: Lung RADS 4 B Low Dose CT Chest Doxycycline 100 mg twice daily with a full glass of water for 7 days Follow up CT Chest Scan in 6-8 weeks to re-evaluate the area.  Tobacco Abuse Keep working on quitting smoking  Dyspnea on Exertion Use rescue inhaler provided in ED as prescribed Referral to Pulmonology  Difficulty swallowing  Hiatal hernia per CT Follow up with PCP about swallow eval as is scheduled  HTN Follow up with PCP regarding BP as is scheduled  AM Headaches/ HTN Needs Pulmonary consult and evaluation for OSA.   Follow Up Instructions: Follow up CT in 6-8 weeks  Follow up with PCP 09/08/2019 as scheduled   I discussed the assessment and treatment plan with the patient. The patient was provided an opportunity to ask questions and all were answered. The patient agreed with the plan and demonstrated an understanding of the instructions.   The patient  was advised to call back or seek an in-person evaluation if the symptoms worsen or if the condition fails to improve as anticipated.  40 minutes of care was provided today   [name], NP 09/06/2019 1:17 PM

## 2019-09-07 ENCOUNTER — Other Ambulatory Visit: Payer: Self-pay | Admitting: *Deleted

## 2019-09-07 ENCOUNTER — Telehealth: Payer: Self-pay | Admitting: Family Medicine

## 2019-09-07 DIAGNOSIS — Z87891 Personal history of nicotine dependence: Secondary | ICD-10-CM

## 2019-09-07 DIAGNOSIS — R9389 Abnormal findings on diagnostic imaging of other specified body structures: Secondary | ICD-10-CM

## 2019-09-07 DIAGNOSIS — F17210 Nicotine dependence, cigarettes, uncomplicated: Secondary | ICD-10-CM

## 2019-09-08 ENCOUNTER — Ambulatory Visit (INDEPENDENT_AMBULATORY_CARE_PROVIDER_SITE_OTHER): Payer: Medicare Other | Admitting: Family Medicine

## 2019-09-08 ENCOUNTER — Encounter: Payer: Self-pay | Admitting: Family Medicine

## 2019-09-08 ENCOUNTER — Other Ambulatory Visit: Payer: Self-pay

## 2019-09-08 VITALS — BP 154/72 | HR 73 | Temp 98.9°F | Ht 62.0 in | Wt 221.0 lb

## 2019-09-08 DIAGNOSIS — G8929 Other chronic pain: Secondary | ICD-10-CM | POA: Diagnosis not present

## 2019-09-08 DIAGNOSIS — M25561 Pain in right knee: Secondary | ICD-10-CM

## 2019-09-08 DIAGNOSIS — F329 Major depressive disorder, single episode, unspecified: Secondary | ICD-10-CM

## 2019-09-08 DIAGNOSIS — E1159 Type 2 diabetes mellitus with other circulatory complications: Secondary | ICD-10-CM

## 2019-09-08 DIAGNOSIS — I10 Essential (primary) hypertension: Secondary | ICD-10-CM

## 2019-09-08 DIAGNOSIS — I152 Hypertension secondary to endocrine disorders: Secondary | ICD-10-CM

## 2019-09-08 DIAGNOSIS — F32A Depression, unspecified: Secondary | ICD-10-CM

## 2019-09-08 LAB — BASIC METABOLIC PANEL
BUN/Creatinine Ratio: 12 (ref 12–28)
BUN: 12 mg/dL (ref 8–27)
CO2: 23 mmol/L (ref 20–29)
Calcium: 9.8 mg/dL (ref 8.7–10.3)
Chloride: 99 mmol/L (ref 96–106)
Creatinine, Ser: 0.98 mg/dL (ref 0.57–1.00)
GFR calc Af Amer: 69 mL/min/{1.73_m2} (ref >59)
GFR calc non Af Amer: 60 mL/min/{1.73_m2} (ref >59)
Glucose: 255 mg/dL — ABNORMAL HIGH (ref 65–99)
Potassium: 4.1 mmol/L (ref 3.5–5.2)
Sodium: 137 mmol/L (ref 134–144)

## 2019-09-08 MED ORDER — BUPROPION HCL ER (XL) 150 MG PO TB24: 150.0000 mg | ORAL_TABLET | Freq: Every day | ORAL | 0 refills | Status: DC

## 2019-09-08 MED ORDER — METHYLPREDNISOLONE ACETATE 40 MG/ML IJ SUSP
40.0000 mg | Freq: Once | INTRAMUSCULAR | Status: AC
  Administered 2019-09-08: 14:00:00 40 mg via INTRA_ARTICULAR

## 2019-09-08 NOTE — Progress Notes (Signed)
Subjective: CC: f/u HTN, knee pain PCP: Donna Norlander, DO XTA:VWPVXYIA [name] is a 69 y.o. female presenting to clinic today for:  1.  Hypertension She reports compliance with losartan 50 mg daily which was added at last visit.  She has been keeping an eye on blood pressures and they have been ranging anywhere from 165V systolic to 374M.  She has very erratic diastolic blood pressure readings which range anywhere from 80-144.  No chest pain, shortness of breath.  She does report daily mild headache.  Does not report any visual disturbance.   2. Right knee pain Ongoing chronic right-sided knee pain.  Last corticosteroid injection was 12/28/2018.  She does report feeling instability of that right knee and has a walker at home but did not bring that to her appointment today.  She would like repeat corticosteroid injection today as this did seem helpful.  3.  Depressive disorder Patient with ongoing uncontrolled depression.  She notes that depression seem to start after the passing of her parents a couple of years ago.  She reports difficulty with sleeping, excessive daytime sleepiness, poor concentration and low motivation.  She is compliant with Celexa and buspirone was added at last visit for anxiety symptoms.  ROS: Per HPI  Allergies  Allergen Reactions  . Ciprofloxacin Hcl Rash   Past Medical History:  Diagnosis Date  . Anemia   . Arthritis    rheumatoid  . Depression   . Diabetes ([place])   . Diabetes mellitus without complication ([place])   . GERD (gastroesophageal reflux disease)   . Headache   . Hyperlipidemia   . Hyperplastic rectal polyp 10/21/2015  . Hypertension    pt. denies  . Lymphocytic colitis   . Shortness of breath dyspnea    with exertion    Current Outpatient Medications:  .  budesonide (ENTOCORT EC) 3 MG 24 hr capsule, TAKE 3 CAPSULES ONCE DAILY, Disp: 90 capsule, Rfl: 0 .  diphenoxylate-atropine (LOMOTIL) 2.5-0.025 MG tablet, TAKE 1 OR 2 TABLETS FOUR  TIMES DAILY AS NEEDED, Disp: 90 tablet, Rfl: 0 .  aspirin 81 MG tablet, Take 81 mg by mouth daily., Disp: , Rfl:  .  atenolol (TENORMIN) 25 MG tablet, TAKE 1 TABLET BY MOUTH DAILY., Disp: 90 tablet, Rfl: 0 .  busPIRone (BUSPAR) 5 MG tablet, Take 1 tablet (5 mg total) by mouth 2 (two) times daily for 7 days, THEN 1.5 tablets (7.5 mg total) 2 (two) times daily for 7 days, THEN 2 tablets (10 mg total) 2 (two) times daily for 21 days., Disp: 120 tablet, Rfl: 0 .  cholecalciferol (VITAMIN D) 1000 UNITS tablet, Take 1,000 Units by mouth daily., Disp: , Rfl:  .  citalopram (CELEXA) 40 MG tablet, TAKE 1 TABLET EVERY DAY (Needs to be seen), Disp: 90 tablet, Rfl: 0 .  diclofenac (VOLTAREN) 75 MG EC tablet, Take 75 mg by mouth daily., Disp: , Rfl:  .  doxycycline (VIBRA-TABS) 100 MG tablet, Take 1 tablet (100 mg total) by mouth 2 (two) times daily., Disp: 14 tablet, Rfl: 0 .  etanercept (ENBREL) 50 MG/ML injection, Inject 50 mg into the skin once a week., Disp: , Rfl:  .  gabapentin (NEURONTIN) 600 MG tablet, TAKE (2) TABLETS THREE TIMES DAILY., Disp: 120 tablet, Rfl: 1 .  glipiZIDE (GLUCOTROL) 10 MG tablet, Take 1 tablet (10 mg total) by mouth 2 (two) times daily before a meal., Disp: 180 tablet, Rfl: 0 .  HYDROcodone-acetaminophen (NORCO/VICODIN) 5-325 MG per tablet, Take  1 tablet by mouth every 6 (six) hours as needed. (Patient taking differently: Take 1 tablet by mouth every 6 (six) hours as needed for moderate pain. ), Disp: 60 tablet, Rfl: 0 .  influenza vaccine adjuvanted (FLUAD QUADRIVALENT) 0.5 ML injection, Fluad Quad 2020-2021(11yrup)(PF) 60 mcg (15 mcg x 4)/0.567mIM syringe, Disp: , Rfl:  .  losartan (COZAAR) 50 MG tablet, Take 1 tablet (50 mg total) by mouth daily., Disp: 90 tablet, Rfl: 0 .  omeprazole (PRILOSEC) 20 MG capsule, TAKE (1) CAPSULE DAILY, Disp: 90 capsule, Rfl: 0 .  simvastatin (ZOCOR) 40 MG tablet, Take 1 tablet (40 mg total) by mouth daily. Needs visit for additional refills, Disp:  30 tablet, Rfl: 0 .  Tdap (BOOSTRIX) 5-2.5-18.5 LF-MCG/0.5 injection, Boostrix Tdap 2.5 Lf unit-8 mcg-5 Lf/0.5 mL intramuscular syringe, Disp: , Rfl:  .  vitamin C (ASCORBIC ACID) 500 MG tablet, Take 500 mg by mouth daily., Disp: , Rfl:  .  Zoster Vaccine Adjuvanted (SHINGRIX) injection, Shingrix (PF) 50 mcg/0.5 mL intramuscular suspension, kit, Disp: , Rfl:  Social History   Socioeconomic History  . Marital status: Single    Spouse name: Not on file  . Number of children: 1  . Years of education: Not on file  . Highest education level: Not on file  Occupational History  . Occupation: taking care of parents  Tobacco Use  . Smoking status: Current Every Day Smoker    Packs/day: 0.75    Years: 46.00    Pack years: 34.50    Types: Cigarettes  . Smokeless tobacco: Current User  . Tobacco comment: less than a pack daily. States she cannot afford Chantix.  Substance and Sexual Activity  . Alcohol use: No    Alcohol/week: 0.0 standard drinks  . Drug use: No  . Sexual activity: Not Currently  Other Topics Concern  . Not on file  Social History Narrative   The patient is divorced she has 1 sonWho is married that she has a grandchild.   She is not employed though the chart indicates she has cared for her parents   She does not use alcohol or drugs she drinks 3-4 caffeinated beverages daily   She is a smoker   Social Determinants of HeRadio broadcast assistanttrain:   . Difficulty of Paying Living Expenses: Not on file  Food Insecurity:   . Worried About RuCharity fundraisern the Last Year: Not on file  . Ran Out of Food in the Last Year: Not on file  Transportation Needs:   . Lack of Transportation (Medical): Not on file  . Lack of Transportation (Non-Medical): Not on file  Physical Activity:   . Days of Exercise per Week: Not on file  . Minutes of Exercise per Session: Not on file  Stress:   . Feeling of Stress : Not on file  Social Connections:   . Frequency of  Communication with Friends and Family: Not on file  . Frequency of Social Gatherings with Friends and Family: Not on file  . Attends Religious Services: Not on file  . Active Member of Clubs or Organizations: Not on file  . Attends ClArchivisteetings: Not on file  . Marital Status: Not on file  Intimate Partner Violence:   . Fear of Current or Ex-Partner: Not on file  . Emotionally Abused: Not on file  . Physically Abused: Not on file  . Sexually Abused: Not on file   Family History  Problem  Relation Age of Onset  . Alzheimer's disease Mother   . Lung cancer Mother   . Diabetes Father   . Colon cancer Neg Hx   . Stomach cancer Neg Hx     Objective: Office vital signs reviewed. BP (!) 154/72   Pulse 73   Temp 98.9 F (37.2 C) (Temporal)   Ht 5' 2"  (1.575 m)   Wt 221 lb (100.2 kg)   SpO2 95%   BMI 40.42 kg/m   Physical Examination:  General: Awake, alert, well nourished, No acute distress Cardio: regular rate and rhythm  Pulm: Normal work of breathing on room air Extremities: warm, well perfused, No edema, cyanosis or clubbing; +2 pulses bilaterally MSK: Ambulates with a limp.  Gait is slow.  Right knee: Limited active range of motion secondary to pain.  No tenderness palpation to the patella, patellar tendon, quad tendon.  She does have mild joint effusion noted.  No ligamentous laxity. Psych: Mood depressed, patient tearful  Depression screen [facility] 2/9 09/01/2019 07/03/2019 12/28/2018  Decreased Interest 1 0 0  Down, Depressed, Hopeless 1 0 0  PHQ - 2 Score 2 0 0  Altered sleeping 1 0 3  Tired, decreased energy 1 0 3  Change in appetite 0 0 0  Feeling bad or failure about yourself  1 0 0  Trouble concentrating 1 0 1  Moving slowly or fidgety/restless 0 0 0  Suicidal thoughts 0 0 0  PHQ-9 Score 6 0 7  Difficult doing work/chores - - Somewhat difficult  Some recent data might be hidden   GAD 7 : Generalized Anxiety Score 09/01/2019 05/16/2018 10/10/2015    Nervous, Anxious, on Edge 3 0 3  Control/stop worrying 2 0 3  Worry too much - different things 2 0 3  Trouble relaxing 1 3 3   Restless 0 0 0  Easily annoyed or irritable 2 0 1  Afraid - awful might happen 1 0 3  Total GAD 7 Score 11 3 16   Anxiety Difficulty Somewhat difficult Not difficult at all Extremely difficult    JOINT INJECTION:  Patient denies allergy to antiseptics (including iodine) and anesthetics.  Patient has a h/o diabetes.  Denies frequent steroid use, use of blood thinners/ antiplatelets.  Patient was given informed consent and a signed copy has been placed in the chart. Appropriate time out was taken. Area prepped and draped in usual sterile fashion. Anatomic landmarks were identified and injection site was marked.  Ethyl chloride spray was used to numb the area and 1 cc of methylprednisolone 40 mg/ml plus  3 cc of 1% lidocaine without epinephrine was injected into the right knee using a(n) anteriolateral approach. The patient tolerated the procedure well and there were no immediate complications. Estimated blood loss is less than 1 cc.  Post procedure instructions were reviewed and handout outlining these instructions were provided to patient.  Assessment/ Plan: 69 y.o. female   1. Hypertension associated with diabetes ([place]) Still not controlled.  However, it may be due to being on the medication for less than 1 week.  I also think that chronic pain and current mental health is also impacting blood pressure.  Check BMP given initiation of ARB.  I referred her to CCM RN and clinical social worker. - Basic Metabolic Panel - Referral to Chronic Care Management Services - Referral to Chronic Care Management Services  2. Chronic pain of right knee Treated with corticosteroid injection today.  She tolerated procedure without difficulty.  Home care instructions  reviewed the patient.  She is to monitor blood sugars closely.  3. Depressive disorder Not well controlled.  Will  refer to counseling services through CCM.  Wellbutrin added.  No history of seizure disorder or other contraindications.  Continue Celexa and buspirone.  Follow-up in 4 weeks - Referral to Chronic Care Management Services - Referral to Chronic Care Management Services - buPROPion (WELLBUTRIN XL) 150 MG 24 hr tablet; Take 1 tablet (150 mg total) by mouth daily.  Dispense: 30 tablet; Refill: 0    No orders of the defined types were placed in this encounter.  No orders of the defined types were placed in this encounter.    Donna Norlander, DO [place] [phone]

## 2019-09-08 NOTE — Patient Instructions (Signed)
Melatonin at bedtime for sleep.  Start with 1 or 3 mg tablets at bedtime.  We've added Wellbutrin for depression  I have ordered counseling to call you.

## 2019-09-08 NOTE — Addendum Note (Signed)
Addended byCarrolyn Leigh on: 09/08/2019 01:40 PM   Modules accepted: Orders

## 2019-09-11 ENCOUNTER — Other Ambulatory Visit: Payer: Self-pay | Admitting: Family Medicine

## 2019-09-11 DIAGNOSIS — K2100 Gastro-esophageal reflux disease with esophagitis, without bleeding: Secondary | ICD-10-CM

## 2019-09-21 DIAGNOSIS — M8580 Other specified disorders of bone density and structure, unspecified site: Secondary | ICD-10-CM | POA: Diagnosis not present

## 2019-09-21 DIAGNOSIS — M150 Primary generalized (osteo)arthritis: Secondary | ICD-10-CM | POA: Diagnosis not present

## 2019-09-21 DIAGNOSIS — M0579 Rheumatoid arthritis with rheumatoid factor of multiple sites without organ or systems involvement: Secondary | ICD-10-CM | POA: Diagnosis not present

## 2019-09-21 DIAGNOSIS — M5136 Other intervertebral disc degeneration, lumbar region: Secondary | ICD-10-CM | POA: Diagnosis not present

## 2019-09-26 ENCOUNTER — Ambulatory Visit (INDEPENDENT_AMBULATORY_CARE_PROVIDER_SITE_OTHER): Payer: Medicare Other | Admitting: *Deleted

## 2019-09-26 DIAGNOSIS — E1159 Type 2 diabetes mellitus with other circulatory complications: Secondary | ICD-10-CM | POA: Diagnosis not present

## 2019-09-26 DIAGNOSIS — I10 Essential (primary) hypertension: Secondary | ICD-10-CM

## 2019-09-26 DIAGNOSIS — F329 Major depressive disorder, single episode, unspecified: Secondary | ICD-10-CM | POA: Diagnosis not present

## 2019-09-26 DIAGNOSIS — F32A Depression, unspecified: Secondary | ICD-10-CM

## 2019-09-26 NOTE — Chronic Care Management (AMB) (Signed)
Chronic Care Management   Initial Visit Note  09/26/2019 Name: Donna Rogers MRN: [id number] DOB: 10/20/1950  Referred by: [name], DO Reason for referral : Chronic Care Management (RN Initial Outreach)   Donna Rogers is a 69 y.o. year old female who is a primary care patient of [name], DO. The CCM team was consulted for assistance with chronic disease management and care coordination needs related to HTN, HLD, depression, DM.  Review of patient status, including review of consultants reports, relevant laboratory and other test results, and collaboration with appropriate care team members and the patient's provider was performed as part of comprehensive patient evaluation and provision of chronic care management services.    SDOH (Social Determinants of Health) screening performed today: Depression   Tobacco Use Stress Physical Activity. See Care Plan for related entries.   Subjective: I spoke with patient by telephone today and she is interested in working with the CCM team to assist with management of her chronic medical conditions.   Outpatient Encounter Medications as of 09/26/2019  Medication Sig  . aspirin 81 MG tablet Take 81 mg by mouth daily.  Marland Kitchen atenolol (TENORMIN) 25 MG tablet TAKE 1 TABLET BY MOUTH DAILY.  . budesonide (ENTOCORT EC) 3 MG 24 hr capsule TAKE 3 CAPSULES ONCE DAILY  . buPROPion (WELLBUTRIN XL) 150 MG 24 hr tablet Take 1 tablet (150 mg total) by mouth daily.  . busPIRone (BUSPAR) 5 MG tablet Take 1 tablet (5 mg total) by mouth 2 (two) times daily for 7 days, THEN 1.5 tablets (7.5 mg total) 2 (two) times daily for 7 days, THEN 2 tablets (10 mg total) 2 (two) times daily for 21 days.  . cholecalciferol (VITAMIN D) 1000 UNITS tablet Take 1,000 Units by mouth daily.  . citalopram (CELEXA) 40 MG tablet TAKE 1 TABLET EVERY DAY (Needs to be seen)  . diclofenac (VOLTAREN) 75 MG EC tablet Take 75 mg by mouth daily.  . diphenoxylate-atropine  (LOMOTIL) 2.5-0.025 MG tablet TAKE 1 OR 2 TABLETS FOUR TIMES DAILY AS NEEDED  . doxycycline (VIBRA-TABS) 100 MG tablet Take 1 tablet (100 mg total) by mouth 2 (two) times daily.  Marland Kitchen etanercept (ENBREL) 50 MG/ML injection Inject 50 mg into the skin once a week.  . gabapentin (NEURONTIN) 600 MG tablet TAKE (2) TABLETS THREE TIMES DAILY.  Marland Kitchen glipiZIDE (GLUCOTROL) 10 MG tablet Take 1 tablet (10 mg total) by mouth 2 (two) times daily before a meal.  . HYDROcodone-acetaminophen (NORCO/VICODIN) 5-325 MG per tablet Take 1 tablet by mouth every 6 (six) hours as needed. (Patient taking differently: Take 1 tablet by mouth every 6 (six) hours as needed for moderate pain. )  . influenza vaccine adjuvanted (FLUAD QUADRIVALENT) 0.5 ML injection Fluad Quad 2020-2021(84yrup)(PF) 60 mcg (15 mcg x 4)/0.58mIM syringe  . losartan (COZAAR) 50 MG tablet Take 1 tablet (50 mg total) by mouth daily.  . Marland Kitchenmeprazole (PRILOSEC) 20 MG capsule TAKE (1) CAPSULE DAILY  . simvastatin (ZOCOR) 40 MG tablet Take 1 tablet (40 mg total) by mouth daily. Needs visit for additional refills  . Tdap (BOOSTRIX) 5-2.5-18.5 LF-MCG/0.5 injection Boostrix Tdap 2.5 Lf unit-8 mcg-5 Lf/0.5 mL intramuscular syringe  . vitamin C (ASCORBIC ACID) 500 MG tablet Take 500 mg by mouth daily.  . Marland Kitchenoster Vaccine Adjuvanted ([facility] Shingrix (PF) 50 mcg/0.5 mL intramuscular suspension, kit   No facility-administered encounter medications on file as of 09/26/2019.    BP Readings from Last 3 Encounters:  09/08/19 (!) 154/72  09/01/19 (!) 155/80  08/29/19 (!) 172/72     RN Care Plan   . Chronic Disease Management Needs (pt-stated)       Current Barriers:  . Chronic Disease Management support, education, and care coordination needs related to HTN, HLD, depression, DM  Clinical Goal(s) related to HTN, HLD, depression, DM:  Over the next 30 days, patient will:  . Work with the care management team to address educational, disease management, and  care coordination needs  . Begin or continue self health monitoring activities as directed today Measure and record blood pressure 7 times per week . Call provider office for new or worsened signs and symptoms Blood pressure findings outside established parameters . Call care management team with questions or concerns . Verbalize basic understanding of patient centered plan of care established today  Interventions related to HTN, HLD, depression, DM:  . Evaluation of current treatment plans and patient's adherence to plan as established by provider . Assessed patient understanding of disease states . Assessed patient's education and care coordination needs . Provided disease specific education to patient  . Collaborated with appropriate clinical care team members regarding patient needs  Patient Self Care Activities related to HTN, HLD, depression, DM:  . Patient is unable to independently self-manage chronic health conditions  Initial goal documentation     . Blood Pressure Management       Current Barriers:  . Chronic Disease Management support and education needs related to hypertension  Nurse Case Manager Clinical Goal(s):  Marland Kitchen Over the next 30 days, patient will work with Consulting civil engineer to address needs related to hypertension  Interventions:  . Evaluation of current treatment plan related to hypertension and patient's adherence to plan as established by provider. . Reviewed medications with patient and discussed atenolol and losartan . Discussed plans with patient for ongoing care management follow up and provided patient with direct contact information for care management team . Advised patient, providing education and rationale, to monitor blood pressure daily and record, calling [phone] for findings outside established parameters.  . Discussed HPI o Having some headaches and dizziness o Has blood pressure monitor but has trouble using it alone. Has not checked it since  last office visit  Patient Self Care Activities:  . Performs ADL's independently . Performs IADL's independently   Initial goal documentation     . Fall Prevention       Current Barriers:  Marland Kitchen Knowledge Deficits related to fall precautions . Decreased adherence to prescribed treatment for fall prevention  Nurse Case Manager Clinical Goal(s):  Marland Kitchen Over the next 30 days, patient will demonstrate improved adherence to prescribed treatment plan for decreasing falls as evidenced by patient reporting and review of EMR . Over the next 14 days, patient will verbalize using fall risk reduction strategies discussed . Over the next 90 days, patient will not experience additional falls  Interventions:  . Provided written and verbal education re: Potential causes of falls and Fall prevention strategies . Reviewed medications and discussed potential side effects of medications such as dizziness and frequent urination . Assessed for s/s of orthostatic hypotension o Has some lightheadedness with position change . Assessed for falls since last encounter. . Assessed patients knowledge of fall risk prevention secondary to previously provided education. . Advised to move purposefully and change positions slowly  Patient Self Care Activities:  . Utilize walker (assistive device) appropriately with all ambulation . De-clutter walkways . Change positions slowly . Wear  secure fitting shoes at all times with ambulation . Utilize home lighting for dim lit areas . Have self and pet awareness at all times  Plan: . CCM RN CM will follow up in 30   Initial goal documentation         Ms. Brethauer was given information about Chronic Care Management services today including:  1. CCM service includes personalized support from designated clinical staff supervised by her physician, including individualized plan of care and coordination with other care providers 2. 24/7 contact phone numbers for assistance for  urgent and routine care needs. 3. Service will only be billed when office clinical staff spend 20 minutes or more in a month to coordinate care. 4. Only one practitioner may furnish and bill the service in a calendar month. 5. The patient may stop CCM services at any time (effective at the end of the month) by phone call to the office staff. 6. The patient will be responsible for cost sharing (co-pay) of up to 20% of the service fee (after annual deductible is met).  Patient agreed to services and verbal consent obtained.   Plan:   The care management team will reach out to the patient again over the next 14 days.   Chong Sicilian, BSN, RN-BC Embedded Chronic Care Manager [facility] [facility] Direct Dial: [phone]

## 2019-09-26 NOTE — Patient Instructions (Signed)
Visit Information  Goals Addressed            This Visit's Progress     Patient Stated   . Chronic Disease Management Needs (pt-stated)       Current Barriers:  . Chronic Disease Management support, education, and care coordination needs related to HTN, HLD, depression, DM  Clinical Goal(s) related to HTN, HLD, depression, DM:  Over the next 30 days, patient will:  . Work with the care management team to address educational, disease management, and care coordination needs  . Begin or continue self health monitoring activities as directed today Measure and record blood pressure 7 times per week . Call provider office for new or worsened signs and symptoms Blood pressure findings outside established parameters . Call care management team with questions or concerns . Verbalize basic understanding of patient centered plan of care established today  Interventions related to HTN, HLD, depression, DM:  . Evaluation of current treatment plans and patient's adherence to plan as established by provider . Assessed patient understanding of disease states . Assessed patient's education and care coordination needs . Provided disease specific education to patient  . Collaborated with appropriate clinical care team members regarding patient needs  Patient Self Care Activities related to HTN, HLD, depression, DM:  . Patient is unable to independently self-manage chronic health conditions  Initial goal documentation       Other   . Blood Pressure Management       Current Barriers:  . Chronic Disease Management support and education needs related to hypertension  Nurse Case Manager Clinical Goal(s):  Marland Kitchen Over the next 30 days, patient will work with Consulting civil engineer to address needs related to hypertension  Interventions:  . Evaluation of current treatment plan related to hypertension and patient's adherence to plan as established by provider. . Reviewed medications with patient and discussed  atenolol and losartan . Discussed plans with patient for ongoing care management follow up and provided patient with direct contact information for care management team . Advised patient, providing education and rationale, to monitor blood pressure daily and record, calling [phone] for findings outside established parameters.  . Discussed HPI o Having some headaches and dizziness o Has blood pressure monitor but has trouble using it alone. Has not checked it since last office visit  Patient Self Care Activities:  . Performs ADL's independently . Performs IADL's independently   Initial goal documentation     . Fall Prevention       Current Barriers:  Marland Kitchen Knowledge Deficits related to fall precautions . Decreased adherence to prescribed treatment for fall prevention  Nurse Case Manager Clinical Goal(s):  Marland Kitchen Over the next 30 days, patient will demonstrate improved adherence to prescribed treatment plan for decreasing falls as evidenced by patient reporting and review of EMR . Over the next 14 days, patient will verbalize using fall risk reduction strategies discussed . Over the next 90 days, patient will not experience additional falls  Interventions:  . Provided written and verbal education re: Potential causes of falls and Fall prevention strategies . Reviewed medications and discussed potential side effects of medications such as dizziness and frequent urination . Assessed for s/s of orthostatic hypotension o Has some lightheadedness with position change . Assessed for falls since last encounter. . Assessed patients knowledge of fall risk prevention secondary to previously provided education. . Advised to move purposefully and change positions slowly  Patient Self Care Activities:  . Utilize walker (assistive device) appropriately with  all ambulation . De-clutter walkways . Change positions slowly . Wear secure fitting shoes at all times with ambulation . Utilize home  lighting for dim lit areas . Have self and pet awareness at all times  Plan: . CCM RN CM will follow up in 30   Initial goal documentation        The care management team will reach out to the patient again over the next 14 days.    Ms. Bonds was given information about Chronic Care Management services today including:  1. CCM service includes personalized support from designated clinical staff supervised by her physician, including individualized plan of care and coordination with other care providers 2. 24/7 contact phone numbers for assistance for urgent and routine care needs. 3. Service will only be billed when office clinical staff spend 20 minutes or more in a month to coordinate care. 4. Only one practitioner may furnish and bill the service in a calendar month. 5. The patient may stop CCM services at any time (effective at the end of the month) by phone call to the office staff. 6. The patient will be responsible for cost sharing (co-pay) of up to 20% of the service fee (after annual deductible is met).  Patient agreed to services and verbal consent obtained.   Chong Sicilian, BSN, RN-BC Embedded Chronic Care Manager [facility] [place] [facility] Direct Dial: [phone]   The patient verbalized understanding of instructions provided today and declined a print copy of patient instruction materials.

## 2019-09-28 ENCOUNTER — Other Ambulatory Visit: Payer: Self-pay | Admitting: Family Medicine

## 2019-09-28 DIAGNOSIS — G629 Polyneuropathy, unspecified: Secondary | ICD-10-CM

## 2019-10-03 ENCOUNTER — Ambulatory Visit: Payer: Medicare Other | Admitting: Family Medicine

## 2019-10-06 ENCOUNTER — Other Ambulatory Visit: Payer: Self-pay | Admitting: Family Medicine

## 2019-10-06 DIAGNOSIS — F329 Major depressive disorder, single episode, unspecified: Secondary | ICD-10-CM

## 2019-10-06 DIAGNOSIS — F32A Depression, unspecified: Secondary | ICD-10-CM

## 2019-10-07 ENCOUNTER — Other Ambulatory Visit: Payer: Self-pay | Admitting: Family Medicine

## 2019-10-07 DIAGNOSIS — F411 Generalized anxiety disorder: Secondary | ICD-10-CM

## 2019-10-07 DIAGNOSIS — F329 Major depressive disorder, single episode, unspecified: Secondary | ICD-10-CM

## 2019-10-07 DIAGNOSIS — F410 Panic disorder [episodic paroxysmal anxiety] without agoraphobia: Secondary | ICD-10-CM

## 2019-10-07 DIAGNOSIS — F32A Depression, unspecified: Secondary | ICD-10-CM

## 2019-10-09 ENCOUNTER — Other Ambulatory Visit: Payer: Self-pay | Admitting: Family Medicine

## 2019-10-09 MED ORDER — BUSPIRONE HCL 10 MG PO TABS: 10.0000 mg | ORAL_TABLET | Freq: Two times a day (BID) | ORAL | 0 refills | Status: DC

## 2019-10-09 NOTE — Telephone Encounter (Signed)
10mg  BID tablet sent.  Please inform patient of change in tablet.  Will only need to take 1 tablet BID of this.  Also, please have her schedule OV.  She was supposed to have a 1 m follow up

## 2019-10-10 ENCOUNTER — Telehealth: Payer: Self-pay | Admitting: Pulmonary Disease

## 2019-10-10 ENCOUNTER — Other Ambulatory Visit: Payer: Self-pay

## 2019-10-10 ENCOUNTER — Ambulatory Visit: Payer: Medicare Other | Admitting: *Deleted

## 2019-10-10 NOTE — Telephone Encounter (Signed)
LVM for patient to advise she may have been contacted regarding future testing and the message would be forwarded to Doroteo Glassman, Therapist, sports.  Langley Gauss, did not see where anyone from our office called. But she has a CT cancer screen for 10/20/19.

## 2019-10-10 NOTE — Telephone Encounter (Signed)
Left message informing pt of medication change of Buspar to 10mg  BID. Informed that Dr. Lajuana Ripple sent in the medication yesterday. Pt made aware that she does need a follow up. Pt is scheduled for tomorrow. Pt to return call with concerns.

## 2019-10-10 NOTE — Chronic Care Management (AMB) (Signed)
  Chronic Care Management   Outreach Note  10/10/2019 Name: Donna Rogers MRN: [id number] DOB: 07-12-1951  Referred by: [name], DO Reason for referral : Chronic Care Management (RN follow up)   An unsuccessful telephone follow-up was attempted today. The patient was referred to the case management team for assistance with care management and care coordination.   Follow Up Plan: A HIPPA compliant phone message was left for the patient providing contact information and requesting a return call.  The care management team will reach out to the patient again over the next 14 days.   Chong Sicilian, BSN, RN-BC Embedded Chronic Care Manager [facility] [place] [facility] Direct Dial: [phone]

## 2019-10-11 ENCOUNTER — Encounter: Payer: Self-pay | Admitting: Family Medicine

## 2019-10-11 ENCOUNTER — Ambulatory Visit (INDEPENDENT_AMBULATORY_CARE_PROVIDER_SITE_OTHER): Payer: Medicare Other | Admitting: Family Medicine

## 2019-10-11 VITALS — BP 163/85 | HR 71 | Temp 96.8°F | Ht 62.0 in | Wt 221.0 lb

## 2019-10-11 DIAGNOSIS — F5104 Psychophysiologic insomnia: Secondary | ICD-10-CM | POA: Diagnosis not present

## 2019-10-11 DIAGNOSIS — E1165 Type 2 diabetes mellitus with hyperglycemia: Secondary | ICD-10-CM

## 2019-10-11 DIAGNOSIS — I10 Essential (primary) hypertension: Secondary | ICD-10-CM

## 2019-10-11 LAB — BAYER DCA HB A1C WAIVED: HB A1C (BAYER DCA - WAIVED): 8.7 % — ABNORMAL HIGH (ref <7.0)

## 2019-10-11 MED ORDER — JARDIANCE 10 MG PO TABS: 10.0000 mg | ORAL_TABLET | Freq: Every day | ORAL | 2 refills | Status: DC

## 2019-10-11 MED ORDER — ZOLPIDEM TARTRATE 5 MG PO TABS: 2.5000 mg | ORAL_TABLET | Freq: Every evening | ORAL | 1 refills | Status: DC | PRN

## 2019-10-11 MED ORDER — LOSARTAN POTASSIUM 100 MG PO TABS: 100.0000 mg | ORAL_TABLET | Freq: Every day | ORAL | 3 refills | Status: DC

## 2019-10-11 NOTE — Progress Notes (Signed)
Subjective: CC: DM2, HTN PCP: [name], DO NUU:[id number] Donna Rogers is Donna 69 y.o. female presenting to clinic today for:  1. Type 2 Diabetes with neuropathy, hyperlipidemia and hypertension:  Patient reports compliance with her glipizide 10 mg twice daily, Cozaar 50 mg daily, Zocor 40 mg daily.  She brings Donna blood pressure log today and blood pressures have remained in the 150s to 190s over 70s to 100s.  She continues to have intermittent headache and dizzy spells.  No nausea, vomiting, balance issues, change in vision, speech.  Last eye exam: home visit w/ insurance Last foot exam: Up-to-date Last A1c:  Lab Results  Component Value Date   HGBA1C 8.1 (H) 07/03/2019   Nephropathy screen indicated? UTD Last flu, zoster and/or pneumovax:  Immunization History  Administered Date(s) Administered  . Fluad Quad(high Dose 65+) 05/03/2019  . Influenza,inj,Quad PF,6+ Mos 05/25/2013, 07/31/2015, 06/18/2016, 05/16/2018  . Influenza-Unspecified 05/07/2019  . PPD Test 05/08/2013  . Pneumococcal Conjugate-13 05/16/2018  . Pneumococcal Polysaccharide-23 07/03/2019  . Tdap 12/28/2018  . Zoster Recombinat (Shingrix) 06/06/2019, 06/30/2019    2.  Anxiety disorder/sleep disturbance Patient reports that anxiety seems to be getting somewhat better.  She thinks that the Wellbutrin is helping.  She continues to take Celexa as well.  She does admit that she has quite Donna bit of difficulty falling asleep despite good sleep hygiene.  Denies any daytime naps, caffeine intake, sugar intake.  Room is ambient.  She has been using up to 10 mg of melatonin at nighttime but this is not helping.  ROS: Per HPI  Allergies  Allergen Reactions  . Ciprofloxacin Hcl Rash   Past Medical History:  Diagnosis Date  . Anemia   . Arthritis    rheumatoid  . Depression   . Diabetes ([place])   . Diabetes mellitus without complication ([place])   . GERD (gastroesophageal reflux disease)   . Headache   .  Hyperlipidemia   . Hyperplastic rectal polyp 10/21/2015  . Hypertension    pt. denies  . Lymphocytic colitis   . Shortness of breath dyspnea    with exertion    Current Outpatient Medications:  .  aspirin 81 MG tablet, Take 81 mg by mouth daily., Disp: , Rfl:  .  atenolol (TENORMIN) 25 MG tablet, TAKE 1 TABLET BY MOUTH DAILY., Disp: 90 tablet, Rfl: 0 .  budesonide (ENTOCORT EC) 3 MG 24 hr capsule, TAKE 3 CAPSULES ONCE DAILY, Disp: 90 capsule, Rfl: 0 .  buPROPion (WELLBUTRIN XL) 150 MG 24 hr tablet, Take 1 tablet (150 mg total) by mouth daily., Disp: 30 tablet, Rfl: 1 .  busPIRone (BUSPAR) 10 MG tablet, Take 1 tablet (10 mg total) by mouth 2 (two) times daily. *Note change in dose/ tablet. Needs OV, Disp: 60 tablet, Rfl: 0 .  cholecalciferol (VITAMIN D) 1000 UNITS tablet, Take 1,000 Units by mouth daily., Disp: , Rfl:  .  citalopram (CELEXA) 40 MG tablet, TAKE 1 TABLET EVERY DAY (Needs to be seen), Disp: 90 tablet, Rfl: 0 .  diclofenac (VOLTAREN) 75 MG EC tablet, Take 75 mg by mouth daily., Disp: , Rfl:  .  diphenoxylate-atropine (LOMOTIL) 2.5-0.025 MG tablet, TAKE 1 OR 2 TABLETS FOUR TIMES DAILY AS NEEDED, Disp: 90 tablet, Rfl: 0 .  etanercept (ENBREL) 50 MG/ML injection, Inject 50 mg into the skin once Donna week., Disp: , Rfl:  .  gabapentin (NEURONTIN) 600 MG tablet, TAKE (2) TABLETS THREE TIMES DAILY., Disp: 120 tablet, Rfl: 0 .  glipiZIDE (  GLUCOTROL) 10 MG tablet, Take 1 tablet (10 mg total) by mouth 2 (two) times daily before Donna meal., Disp: 180 tablet, Rfl: 0 .  HYDROcodone-acetaminophen (NORCO/VICODIN) 5-325 MG per tablet, Take 1 tablet by mouth every 6 (six) hours as needed. (Patient taking differently: Take 1 tablet by mouth every 6 (six) hours as needed for moderate pain. ), Disp: 60 tablet, Rfl: 0 .  influenza vaccine adjuvanted (FLUAD QUADRIVALENT) 0.5 ML injection, Fluad Quad 2020-2021(46yrup)(PF) 60 mcg (15 mcg x 4)/0.566mIM syringe, Disp: , Rfl:  .  losartan (COZAAR) 50 MG tablet,  Take 1 tablet (50 mg total) by mouth daily., Disp: 90 tablet, Rfl: 0 .  omeprazole (PRILOSEC) 20 MG capsule, TAKE (1) CAPSULE DAILY, Disp: 90 capsule, Rfl: 0 .  simvastatin (ZOCOR) 40 MG tablet, TAKE 1 TABLET DAILY, Disp: 90 tablet, Rfl: 0 .  Tdap (BOOSTRIX) 5-2.5-18.5 LF-MCG/0.5 injection, Boostrix Tdap 2.5 Lf unit-8 mcg-5 Lf/0.5 mL intramuscular syringe, Disp: , Rfl:  .  vitamin C (ASCORBIC ACID) 500 MG tablet, Take 500 mg by mouth daily., Disp: , Rfl:  .  Zoster Vaccine Adjuvanted (SHINGRIX) injection, Shingrix (PF) 50 mcg/0.5 mL intramuscular suspension, kit, Disp: , Rfl:  Social History   Socioeconomic History  . Marital status: Single    Spouse name: Not on file  . Number of children: 1  . Years of education: Not on file  . Highest education level: Not on file  Occupational History  . Occupation: taking care of parents  Tobacco Use  . Smoking status: Current Every Day Smoker    Packs/day: 0.75    Years: 46.00    Pack years: 34.50    Types: Cigarettes  . Smokeless tobacco: Current User  . Tobacco comment: less than Donna pack daily. States she cannot afford Chantix.  Substance and Sexual Activity  . Alcohol use: No    Alcohol/week: 0.0 standard drinks  . Drug use: No  . Sexual activity: Not Currently  Other Topics Concern  . Not on file  Social History Narrative   The patient is divorced she has 1 sonWho is married that she has Donna grandchild.   She is not employed though the chart indicates she has cared for her parents   She does not use alcohol or drugs she drinks 3-4 caffeinated beverages daily   She is Donna smoker   Social Determinants of [employer] broadcast assistanttrain: Medium Risk  . Difficulty of Paying Living Expenses: Somewhat hard  Food Insecurity: No Food Insecurity  . Worried About Ru[employer]ity fundraisern the Last Year: Never true  . Ran Out of Food in the Last Year: Never true  Transportation Needs: No Transportation Needs  . Lack of Transportation (Medical):  No  . Lack of Transportation (Non-Medical): No  Physical Activity: Inactive  . Days of Exercise per Week: 0 days  . Minutes of Exercise per Session: 0 min  Stress: Stress Concern Present  . Feeling of Stress : To some extent  Social Connections: Unknown  . Frequency of Communication with Friends and Family: More than three times Donna week  . Frequency of Social Gatherings with Friends and Family: Not on file  . Attends Religious Services: Not on file  . Active Member of Clubs or Organizations: No  . Attends ClArchivisteetings: Never  . Marital Status: Not on file  Intimate Partner Violence: Not At Risk  . Fear of Current or Ex-Partner: No  . Emotionally Abused: No  .  Physically Abused: No  . Sexually Abused: No   Family History  Problem Relation Age of Onset  . Alzheimer's disease Mother   . Lung cancer Mother   . Diabetes Father   . Colon cancer Neg Hx   . Stomach cancer Neg Hx     Objective: Office vital signs reviewed. BP (!) 163/85   Pulse 71   Temp (!) 96.8 F (36 C) (Temporal)   Ht 5' 2" (1.575 m)   Wt 221 lb (100.2 kg)   SpO2 94%   BMI 40.42 kg/m   Physical Examination:  General: Awake, alert, obese, No acute distress HEENT: Normal; sclera white, MMM Cardio: regular rate and rhythm, S1S2 heard, no murmurs appreciated Pulm: clear to auscultation bilaterally, no wheezes, rhonchi or rales; normal work of breathing on room air Extremities: warm, well perfused, No edema, cyanosis or clubbing; +1 pulses bilaterally Psych: Appears somewhat anxious.  Mood is stable.  Good eye contact.  Does not appear to be responding to internal stimuli. Neuro: No focal neurologic deficits.  Alert and oriented x3.  Follows all commands. Depression screen [facility] 2/9 10/11/2019 09/08/2019 09/01/2019  Decreased Interest 1 - 1  Down, Depressed, Hopeless _0 PHQ - 2 Score _1 Altered sleeping _2 Tired, decreased energy _3 Change in appetite 0 0 0  Feeling bad or  failure about yourself  _4 Trouble concentrating _5 Moving slowly or fidgety/restless 0 0 0  Suicidal thoughts 0 0 0  PHQ-9 Score _6 Difficult doing work/chores Somewhat difficult Somewhat difficult -  Some recent data might be hidden   GAD 7 : Generalized Anxiety Score 10/11/2019 09/01/2019 05/16/2018 10/10/2015  Nervous, Anxious, on Edge 2 3 0 3  Control/stop worrying 2 2 0 3  Worry too much - different things 2 2 0 3  Trouble relaxing _7 Restless 0 0 0 0  Easily annoyed or irritable 1 2 0 1  Afraid - awful might happen 1 1 0 3  Total GAD 7 Score _8 Anxiety Difficulty Somewhat difficult Somewhat difficult Not difficult at all Extremely difficult    Assessment/ Plan: 69 y.o. female   1. Uncontrolled type 2 diabetes mellitus with hyperglycemia (HCC) A1c continues to elevated 8.7 today.  I have added Jardiance to her regimen.  We discussed the potential side effects of this medication.  We will plan to follow-up in 3 months, sooner if needed. - Bayer DCA Hb A1c Waived - empagliflozin (JARDIANCE) 10 MG TABS tablet; Take 10 mg by mouth daily.  Dispense: 30 tablet; Refill: 2  2. Uncontrolled hypertension Blood pressure remains uncontrolled and I think this is probably the causation of her headaches.  I have increased her losartan to 100 mg daily.  She is to continue monitoring her blood pressures at home.  I would like her to come back in for Donna blood pressure recheck with the nurse in 2 weeks.  She understands red flag signs and symptoms warranting evaluation emergency department.  If remains uncontrolled, may need to consider addition of CCB versus referral to resistant hypertension clinic. - losartan (COZAAR) 100 MG tablet; Take 1 tablet (100 mg total) by mouth daily.  Dispense: 90 tablet; Refill: 3  3. Psychophysiological insomnia We discussed the various options including Belsomra.  Because of possible high cost she wants to pursue generic Ambien.  We discussed  the risks of this medication I am recommending that she take no more than 2.5 mg nightly as needed.  We discussed this is not intended for daily use.  The national narcotic database was reviewed and there were no red flags.  She is prescribed chronic pain medication and Lomotil by her specialists.  If we continue this medication, we will plan for controlled substance contract and UDS as per office policy. - zolpidem (AMBIEN) 5 MG tablet; Take 0.5 tablets (2.5 mg total) by mouth at bedtime as needed for sleep.  Dispense: 15 tablet; Refill: 1   Orders Placed This Encounter  Procedures  . Bayer DCA Hb A1c Waived   No orders of the defined types were placed in this encounter.    [name], DO [place] [phone]

## 2019-10-11 NOTE — Patient Instructions (Addendum)
I have prescribed Jardiance for your blood sugar, which was unfortunately going up since your last visit.  You may continue the glipizide 10 mg twice daily as prescribed.  If this medication is not covered, please let me know and we will try prescribing Farxiga instead.  I have increased your dose of losartan because your blood pressure remains uncontrolled.  You may take 2 of your losartan 50 mg to equal 100 mg daily.  I have sent in the 100 mg tablet so when you finish your current bottle, please switch over to just 1 tablet daily.  Goal blood pressure is less than 150/90.  I have added zolpidem (Ambien) to use IF needed at bedtime for sleep.  You will take 1/2 tablet.  We discussed the risk of this medication.  Take this very sparingly.  It is not intended to be used every single night.

## 2019-10-12 NOTE — Telephone Encounter (Signed)
LMTC x 1  

## 2019-10-13 NOTE — Telephone Encounter (Signed)
Patient is returning [place] phone call.  Patient phone number is [phone].

## 2019-10-17 ENCOUNTER — Telehealth: Payer: Medicare Other | Admitting: *Deleted

## 2019-10-17 NOTE — Telephone Encounter (Signed)
LMTC x 1  

## 2019-10-20 ENCOUNTER — Ambulatory Visit (HOSPITAL_COMMUNITY)
Admission: RE | Admit: 2019-10-20 | Discharge: 2019-10-20 | Disposition: A | Discharge status: Home / Self Care | Payer: Medicare Other | Source: Ambulatory Visit | Attending: Acute Care | Admitting: Acute Care

## 2019-10-20 ENCOUNTER — Other Ambulatory Visit: Payer: Self-pay

## 2019-10-20 DIAGNOSIS — R9389 Abnormal findings on diagnostic imaging of other specified body structures: Secondary | ICD-10-CM | POA: Diagnosis not present

## 2019-10-20 DIAGNOSIS — Z87891 Personal history of nicotine dependence: Secondary | ICD-10-CM | POA: Insufficient documentation

## 2019-10-20 DIAGNOSIS — F17210 Nicotine dependence, cigarettes, uncomplicated: Secondary | ICD-10-CM | POA: Insufficient documentation

## 2019-10-20 DIAGNOSIS — R918 Other nonspecific abnormal finding of lung field: Secondary | ICD-10-CM | POA: Diagnosis not present

## 2019-10-20 NOTE — Telephone Encounter (Signed)
Spoke with pt and advised that I couldn't see where someone from our office had called her. I clarified pt that her appt for f/u Chest Ct is today at 11:00. Pt verbalized understanding of this appt. Nothing further needed.

## 2019-10-24 ENCOUNTER — Other Ambulatory Visit: Payer: Self-pay | Admitting: Family Medicine

## 2019-10-24 DIAGNOSIS — G629 Polyneuropathy, unspecified: Secondary | ICD-10-CM

## 2019-10-25 ENCOUNTER — Ambulatory Visit: Payer: Medicare Other

## 2019-10-25 ENCOUNTER — Other Ambulatory Visit: Payer: Self-pay

## 2019-10-25 VITALS — BP 195/91 | HR 83

## 2019-10-25 DIAGNOSIS — I10 Essential (primary) hypertension: Secondary | ICD-10-CM

## 2019-10-25 NOTE — Progress Notes (Signed)
Blood pressure checked at:  159/92, pulse 85  Patient sat for 5 more minutes and blood pressure reading was:  195/91, pulse 83

## 2019-10-26 ENCOUNTER — Telehealth: Payer: Self-pay | Admitting: Acute Care

## 2019-10-26 DIAGNOSIS — Z87891 Personal history of nicotine dependence: Secondary | ICD-10-CM

## 2019-10-26 DIAGNOSIS — F17210 Nicotine dependence, cigarettes, uncomplicated: Secondary | ICD-10-CM

## 2019-10-26 NOTE — Telephone Encounter (Signed)
See result note 10/26/19.

## 2019-10-26 NOTE — Progress Notes (Signed)
I have called the patient with the results of her low dose CT. I explained that her scan was read as a Lung  RADS 3, nodules that are probably benign findings, short term follow up suggested: includes nodules with a low likelihood of becoming a clinically active cancer. Radiology recommends a 6 month repeat LDCT follow up. This is an improvement over the scan done 08/2019 which was read as a Lung RADS 4 B . I explained that we will do a 6 month follow up scan, and that we will call her to schedule closer to the time. She verbalized understanding.  Langley Gauss , please order 6 month follow up and fax results top PCP. Thanks so much.

## 2019-10-31 ENCOUNTER — Ambulatory Visit (INDEPENDENT_AMBULATORY_CARE_PROVIDER_SITE_OTHER): Payer: Medicare Other | Admitting: *Deleted

## 2019-10-31 DIAGNOSIS — Z0000 Encounter for general adult medical examination without abnormal findings: Secondary | ICD-10-CM

## 2019-10-31 MED ORDER — AMLODIPINE BESYLATE 5 MG PO TABS: 5.0000 mg | ORAL_TABLET | Freq: Every day | ORAL | 0 refills | Status: DC

## 2019-10-31 NOTE — Progress Notes (Addendum)
MEDICARE ANNUAL WELLNESS VISIT  10/31/2019  Telephone Visit Disclaimer This Medicare AWV was conducted by telephone due to national recommendations for restrictions regarding the COVID-19 Pandemic (e.g. social distancing).  I verified, using two identifiers, that I am speaking with Donna Rogers or their authorized healthcare agent. I discussed the limitations, risks, security, and privacy concerns of performing an evaluation and management service by telephone and the potential availability of an in-person appointment in the future. The patient expressed understanding and agreed to proceed.   Subjective:  Donna Rogers is a 69 y.o. female patient of Donna Norlander, DO who had a Medicare Annual Wellness Visit today via telephone. Donna Rogers is Retired and lives with an adult companion. she has 1 children. she reports that she is socially active and does interact with friends/family regularly. she is not physically active and enjoys playing games on her tablet, and spending time with her friends and family.  Patient Care Team: Donna Norlander, DO as PCP - General (Family Medicine) Gala Romney, Cristopher Estimable, MD as Consulting Physician (Gastroenterology) Ilean China, RN as Case Manager  Advanced Directives 10/31/2019 08/29/2019 07/27/2018 03/03/2016 02/03/2016 10/21/2015  Does Patient Have a Medical Advance Directive? No No No No No No  Would patient like information on creating a medical advance directive? No - Patient declined - - No - patient declined information Yes - Scientist, clinical (histocompatibility and immunogenetics) given Yes - Educational materials given    Hospital Utilization Over the Past 12 Months: # of hospitalizations or ER visits: 2 # of surgeries: 0  Review of Systems    Patient reports that her overall health is worse compared to last year. Due to hurting more, neuropathy, and bilateral foot pain.  History obtained from chart review and the patient ENT ROS: positive for -  Dizziness Musculoskeletal ROS: positive for - pain in back - bilateral, foot - bilateral, knee - bilateral and shoulder - bilateral  Patient Reported Readings (BP, Pulse, CBG, Weight, etc) none  Pain Assessment Pain : No/denies pain     Current Medications & Allergies (verified) Allergies as of 10/31/2019       Reactions   Ciprofloxacin Hcl Rash        Medication List        Accurate as of October 31, 2019  2:41 PM. If you have any questions, ask your nurse or doctor.          STOP taking these medications    Boostrix 5-2.5-18.5 LF-MCG/0.5 injection Generic drug: Tdap   diclofenac 75 MG EC tablet Commonly known as: VOLTAREN   Fluad Quadrivalent 0.5 ML injection Generic drug: influenza vaccine adjuvanted   Shingrix injection Generic drug: Zoster Vaccine Adjuvanted       TAKE these medications    aspirin 81 MG tablet Take 81 mg by mouth daily.   atenolol 25 MG tablet Commonly known as: TENORMIN TAKE 1 TABLET BY MOUTH DAILY.   budesonide 3 MG 24 hr capsule Commonly known as: ENTOCORT EC TAKE 3 CAPSULES ONCE DAILY   buPROPion 150 MG 24 hr tablet Commonly known as: WELLBUTRIN XL Take 1 tablet (150 mg total) by mouth daily.   busPIRone 10 MG tablet Commonly known as: BUSPAR Take 1 tablet (10 mg total) by mouth 2 (two) times daily. *Note change in dose/ tablet. Needs OV   cholecalciferol 1000 units tablet Commonly known as: VITAMIN D Take 1,000 Units by mouth daily.   citalopram 40 MG tablet Commonly known as: CELEXA TAKE 1  TABLET EVERY DAY (Needs to be seen)   diphenoxylate-atropine 2.5-0.025 MG tablet Commonly known as: LOMOTIL TAKE 1 OR 2 TABLETS FOUR TIMES DAILY AS NEEDED   Enbrel 50 MG/ML injection Generic drug: etanercept Inject 50 mg into the skin once a week.   gabapentin 600 MG tablet Commonly known as: NEURONTIN TAKE (2) TABLETS THREE TIMES DAILY.   glipiZIDE 10 MG tablet Commonly known as: GLUCOTROL Take 1 tablet (10 mg total)  by mouth 2 (two) times daily before a meal.   HYDROcodone-acetaminophen 5-325 MG tablet Commonly known as: NORCO/VICODIN Take 1 tablet by mouth every 6 (six) hours as needed. What changed: reasons to take this   Jardiance 10 MG Tabs tablet Generic drug: empagliflozin Take 10 mg by mouth daily.   losartan 100 MG tablet Commonly known as: COZAAR Take 1 tablet (100 mg total) by mouth daily.   omeprazole 20 MG capsule Commonly known as: PRILOSEC TAKE (1) CAPSULE DAILY   simvastatin 40 MG tablet Commonly known as: ZOCOR TAKE 1 TABLET DAILY   vitamin C 500 MG tablet Commonly known as: ASCORBIC ACID Take 500 mg by mouth daily.   zolpidem 5 MG tablet Commonly known as: AMBIEN Take 0.5 tablets (2.5 mg total) by mouth at bedtime as needed for sleep.        History (reviewed): Past Medical History:  Diagnosis Date   Anemia    Arthritis    rheumatoid   Depression    Diabetes ([place])    Diabetes mellitus without complication (HCC)    GERD (gastroesophageal reflux disease)    Headache    Hyperlipidemia    Hyperplastic rectal polyp 10/21/2015   Hypertension    pt. denies   Lymphocytic colitis    Shortness of breath dyspnea    with exertion   Past Surgical History:  Procedure Laterality Date   BREAST LUMPECTOMY Right 2018   BREAST LUMPECTOMY WITH RADIOACTIVE SEED LOCALIZATION Left 03/03/2016   Procedure: BREAST LUMPECTOMY WITH RADIOACTIVE SEED LOCALIZATION;  Surgeon: Coralie Keens, MD;  Location: [facility];  Service: General;  Laterality: Left;   COLONOSCOPY  08/2002   RMR: internal hemorrhoids   COLONOSCOPY N/A 10/21/2015   Procedure: COLONOSCOPY;  Surgeon: Daneil Dolin, MD;  [facility];  Service: Endoscopy;  Laterality: N/A;  250 - moved to 2:35 - office to notify pt   CYST REMOVAL TRUNK     ESOPHAGOGASTRODUODENOSCOPY  08/2002   RMR: nonerosive reflux esophagitis, hiatal hernia   TONSILLECTOMY     Family History  Problem Relation Age of Onset    Alzheimer's disease Mother    Lung cancer Mother    Diabetes Father    Heart disease Father    Arthritis Sister    Diabetes Sister    Diabetes Brother    Hypertension Brother    Colon cancer Neg Hx    Stomach cancer Neg Hx    Social History   Socioeconomic History   Marital status: Divorced    Spouse name: Not on file   Number of children: 1   Years of education: 11th   Highest education level: 11th grade  Occupational History   Occupation: retired  Tobacco Use   Smoking status: Current Every Day Smoker    Packs/day: 0.75    Years: 46.00    Pack years: 34.50    Types: Cigarettes   Smokeless tobacco: Current User   Tobacco comment: less than a pack daily. States she cannot afford Chantix.  Substance and Sexual Activity  Alcohol use: No    Alcohol/week: 0.0 standard drinks   Drug use: No   Sexual activity: Not Currently  Other Topics Concern   Not on file  Social History Narrative   The patient is divorced she has 1 sonWho is married that she has a grandchild.   She does not use alcohol or drugs she drinks 3-4 caffeinated beverages daily   She is a smoker   Social Determinants of Radio broadcast assistant Strain: Medium Risk   Difficulty of Paying Living Expenses: Somewhat hard  Food Insecurity: No Food Insecurity   Worried About Charity fundraiser in the Last Year: Never true   Ran Out of Food in the Last Year: Never true  Transportation Needs: No Transportation Needs   Lack of Transportation (Medical): No   Lack of Transportation (Non-Medical): No  Physical Activity: Inactive   Days of Exercise per Week: 0 days   Minutes of Exercise per Session: 0 min  Stress: Stress Concern Present   Feeling of Stress : To some extent  Social Connections: Unknown   Frequency of Communication with Friends and Family: More than three times a week   Frequency of Social Gatherings with Friends and Family: Not on file   Attends Religious Services: Not on file   Active  Member of Clubs or Organizations: No   Attends Archivist Meetings: Never   Marital Status: Not on file    Activities of Daily Living In your present state of health, do you have any difficulty performing the following activities: 10/31/2019  Hearing? N  Vision? N  Comment wears reading glasses  Difficulty concentrating or making decisions? Y  Comment trouble remembering  Walking or climbing stairs? N  Dressing or bathing? N  Doing errands, shopping? Y  Comment When shopping she has to have something to hold onto  Preparing Food and eating ? Y  Comment At times due to not being able to stand for long periods of time preparing food  Using the Toilet? N  In the past six months, have you accidently leaked urine? N  Do you have problems with loss of bowel control? Y  Comment at times in the middle of the night  Managing your Medications? N  Managing your Finances? N  Housekeeping or managing your Housekeeping? Y  Comment Due to not being able to stand for long period of times  Some recent data might be hidden    Patient Education/ Literacy How often do you need to have someone help you when you read instructions, pamphlets, or other written materials from your doctor or pharmacy?: 1 - Never What is the last grade level you completed in school?: 11th  Exercise Current Exercise Habits: The patient does not participate in regular exercise at present, Exercise limited by: orthopedic condition(s)  Diet Patient reports consuming 2 meals a day and 1 snack(s) a day Patient reports that her primary diet is: Regular Patient reports that she does have regular access to food.   Depression Screen PHQ 2/9 Scores 10/31/2019 10/11/2019 09/08/2019 09/01/2019 07/03/2019 12/28/2018 08/03/2018  PHQ - 2 Score 2 2 2 2  0 0 4  PHQ- 9 Score 9 9 8 6  0 7 10     Fall Risk Fall Risk  10/31/2019 09/08/2019 07/03/2019 08/03/2018 07/26/2018  Falls in the past year? 1 0 0 0 0  Number falls in past yr:  0 - - - -  Injury with Fall? 0 - - - -  Follow up Falls prevention discussed - - - -     Objective:  Donna Rogers seemed alert and oriented and she participated appropriately during our telephone visit.  Blood Pressure Weight BMI  BP Readings from Last 3 Encounters:  10/25/19 (!) 195/91  10/11/19 (!) 163/85  09/08/19 (!) 154/72   Wt Readings from Last 3 Encounters:  10/11/19 221 lb (100.2 kg)  09/08/19 221 lb (100.2 kg)  09/01/19 221 lb (100.2 kg)   BMI Readings from Last 1 Encounters:  10/11/19 40.42 kg/m    *Unable to obtain current vital signs, weight, and BMI due to telephone visit type  Hearing/Vision  Pippa did not seem to have difficulty with hearing/understanding during the telephone conversation Reports that she has not had a formal eye exam by an eye care professional within the past year Reports that she has not had a formal hearing evaluation within the past year *Unable to fully assess hearing and vision during telephone visit type  Cognitive Function: 6CIT Screen 10/31/2019  What Year? 0 points  What month? 0 points  What time? 0 points  Count back from 20 0 points  Months in reverse 0 points  Repeat phrase 0 points  Total Score 0   (Normal:0-7, Significant for Dysfunction: >8)  Normal Cognitive Function Screening: Yes   Immunization & Health Maintenance Record Immunization History  Administered Date(s) Administered   Fluad Quad(high Dose 65+) 05/03/2019   Influenza,inj,Quad PF,6+ Mos 05/25/2013, 07/31/2015, 06/18/2016, 05/16/2018   Influenza-Unspecified 05/07/2019   PPD Test 05/08/2013   Pneumococcal Conjugate-13 05/16/2018   Pneumococcal Polysaccharide-23 07/03/2019   Tdap 12/28/2018   Zoster Recombinat (Shingrix) 06/06/2019, 06/30/2019    Health Maintenance  Topic Date Due   OPHTHALMOLOGY EXAM  Never done   DEXA SCAN  Never done   MAMMOGRAM  12/19/2017   HEMOGLOBIN A1C  04/09/2020   FOOT EXAM  07/02/2020   COLONOSCOPY  10/20/2025    TETANUS/TDAP  12/27/2028   INFLUENZA VACCINE  Completed   Hepatitis C Screening  Completed   PNA vac Low Risk Adult  Completed       Assessment  This is a routine wellness examination for [employer].  Health Maintenance: Due or Overdue Health Maintenance Due  Topic Date Due   OPHTHALMOLOGY EXAM  Never done   DEXA SCAN  Never done   MAMMOGRAM  12/19/2017    Donna Rogers does not need a referral for Community Assistance: Care Management:   no Social Work:    no Prescription Assistance:  no Nutrition/Diabetes Education:  no   Plan:  Personalized Goals Goals Addressed   None    Personalized Health Maintenance & Screening Recommendations  Screening mammography Bone densitometry screening Diabetic eye exam  Lung Cancer Screening Recommended: yes (Low Dose CT Chest recommended if Age 63-80 years, 30 pack-year currently smoking OR have quit w/in past 15 years) Hepatitis C Screening recommended: no HIV Screening recommended: no  Advanced Directives: Written information was not prepared per patient's request.  Referrals & Orders No orders of the defined types were placed in this encounter.   Follow-up Plan Follow-up with Donna Norlander, DO as planned Schedule yearly eye exam Keep appointment for 2 week nurse BP check  Keep Mammogram appointment on 01/10/2020   I have personally reviewed and noted the following in the patient's chart:   Medical and social history Use of alcohol, tobacco or illicit drugs  Current medications and supplements Functional ability and status Nutritional status Physical activity  Advanced directives List of other physicians Hospitalizations, surgeries, and ER visits in previous 12 months Vitals Screenings to include cognitive, depression, and falls Referrals and appointments  In addition, I have reviewed and discussed with Donna Rogers certain preventive protocols, quality metrics, and best practice  recommendations. A written personalized care plan for preventive services as well as general preventive health recommendations is available and can be mailed to the patient at her request.      Lynnea Ferrier, LPN  [id number]  I have reviewed and agree with the above AWV documentation.   Evelina Dun, FNP

## 2019-10-31 NOTE — Patient Instructions (Signed)
[place] Maintenance Summary and Written Plan of Care  Donna Rogers ,  Thank you for allowing me to perform your Medicare Annual Wellness Visit and for your ongoing commitment to your health.   Health Maintenance & Immunization History Health Maintenance  Topic Date Due  . OPHTHALMOLOGY EXAM  Never done  . DEXA SCAN  Never done  . MAMMOGRAM  12/19/2017  . HEMOGLOBIN A1C  04/09/2020  . FOOT EXAM  07/02/2020  . COLONOSCOPY  10/20/2025  . TETANUS/TDAP  12/27/2028  . INFLUENZA VACCINE  Completed  . Hepatitis C Screening  Completed  . PNA vac Low Risk Adult  Completed   Immunization History  Administered Date(s) Administered  . Fluad Quad(high Dose 65+) 05/03/2019  . Influenza,inj,Quad PF,6+ Mos 05/25/2013, 07/31/2015, 06/18/2016, 05/16/2018  . Influenza-Unspecified 05/07/2019  . PPD Test 05/08/2013  . Pneumococcal Conjugate-13 05/16/2018  . Pneumococcal Polysaccharide-23 07/03/2019  . Tdap 12/28/2018  . Zoster Recombinat (Shingrix) 06/06/2019, 06/30/2019    These are the patient goals that we discussed: Goals Addressed            This Visit's Progress   . AWV       10/31/2019 AWV Goal: Fall Prevention  . Over the next year, patient will decrease their risk for falls by: o Using assistive devices, such as a cane or walker, as needed o Identifying fall risks within their home and correcting them by: - Removing throw rugs - Adding handrails to stairs or ramps - Removing clutter and keeping a clear pathway throughout the home - Increasing light, especially at night - Adding shower handles/bars - Raising toilet seat o Identifying potential personal risk factors for falls: - Medication side effects - Incontinence/urgency - Vestibular dysfunction - Hearing loss - Musculoskeletal disorders - Neurological disorders - Orthostatic hypotension  10/31/2019 AWV Goal: Tobacco Cessation  Smoking cessation instruction/counseling given:  counseled  patient on the dangers of tobacco use, advised patient to stop smoking, and reviewed strategies to maximize success   Patient will verbalize understanding of the health risks associated with smoking/tobacco use  Lung cancer or lung disease, such as COPD  Heart disease.  Stroke.  Heart attack  Infertility  Osteoporosis and bone fractures. . Patient will create a plan to quit smoking/using tobacco  Pick a date to quit.   Write down the reasons why you are quitting and put it where you will see it often.  Identify the people, places, things, and activities that make you want to smoke (triggers) and avoid them. Make sure to take these actions: ? Throw away all cigarettes at home, at work, and in your car. ? Throw away smoking accessories, such as Scientist, research (medical). ? Clean your car and make sure to empty the ashtray. ? Clean your home, including curtains and carpets.  Tell your family, friends, and coworkers that you are quitting. Support from your loved ones can make quitting easier.  Talk with your health care provider about your options for quitting smoking.  Find out what treatment options are covered by your health insurance. . Patient will be able to demonstrate knowledge of tobacco cessation strategies that may maximize success  Quitting "cold [place]" is more successful than gradually quitting.  Attending in-person counseling to help you build problem-solving skills.   Finding resources and support systems that can help you to quit smoking and remain smoke-free after you quit. These resources are most helpful when you use them often. They can include: ? Online chats with  a Social worker. ? Telephone quitlines. ? Careers information officer. ? Support groups or group counseling. ? Text messaging programs. ? Mobile phone applications.  Taking medicines to help you quit smoking: ? Nicotine patches, gum, or lozenges. ? Nicotine inhalers or sprays. ? Non-nicotine  medicine that is taken by mouth. . Patient will note get discouraged if the process is difficult . Over the next year, patient will stop smoking or using other forms of tobacco  Smoking cessation instruction/counseling given:  counseled patient on the dangers of tobacco use, advised patient to stop smoking, and reviewed strategies to maximize success          This is a list of Health Maintenance Items that are overdue or due now: Health Maintenance Due  Topic Date Due  . OPHTHALMOLOGY EXAM  Never done  . DEXA SCAN  Never done  . MAMMOGRAM  12/19/2017     Orders/Referrals Placed Today: No orders of the defined types were placed in this encounter.  (Contact our referral department at [phone] if you have not spoken with someone about your referral appointment within the next 5 days)

## 2019-11-08 ENCOUNTER — Other Ambulatory Visit: Payer: Self-pay | Admitting: Family Medicine

## 2019-11-08 DIAGNOSIS — I10 Essential (primary) hypertension: Secondary | ICD-10-CM

## 2019-11-14 ENCOUNTER — Ambulatory Visit: Payer: Medicare Other | Admitting: *Deleted

## 2019-11-14 ENCOUNTER — Other Ambulatory Visit: Payer: Self-pay

## 2019-11-14 VITALS — BP 131/74 | HR 71

## 2019-11-14 DIAGNOSIS — I10 Essential (primary) hypertension: Secondary | ICD-10-CM

## 2019-11-14 NOTE — Progress Notes (Signed)
Pt came in today for BP check 1st reading was 151/67 so she sat for about 5 minutes and 2nd reading was 131/74. Pt is taking all of her medications as prescribed.

## 2019-12-05 ENCOUNTER — Other Ambulatory Visit: Payer: Self-pay | Admitting: Family Medicine

## 2019-12-05 DIAGNOSIS — F32A Depression, unspecified: Secondary | ICD-10-CM

## 2019-12-05 DIAGNOSIS — K2100 Gastro-esophageal reflux disease with esophagitis, without bleeding: Secondary | ICD-10-CM

## 2019-12-05 DIAGNOSIS — F329 Major depressive disorder, single episode, unspecified: Secondary | ICD-10-CM

## 2019-12-30 ENCOUNTER — Other Ambulatory Visit: Payer: Self-pay | Admitting: Family Medicine

## 2019-12-30 DIAGNOSIS — F32A Depression, unspecified: Secondary | ICD-10-CM

## 2020-01-09 ENCOUNTER — Other Ambulatory Visit: Payer: Self-pay | Admitting: Family Medicine

## 2020-01-09 DIAGNOSIS — E1165 Type 2 diabetes mellitus with hyperglycemia: Secondary | ICD-10-CM

## 2020-01-10 ENCOUNTER — Ambulatory Visit: Payer: Medicare Other | Admitting: Family Medicine

## 2020-01-16 ENCOUNTER — Other Ambulatory Visit: Payer: Self-pay | Admitting: Family Medicine

## 2020-01-16 DIAGNOSIS — G629 Polyneuropathy, unspecified: Secondary | ICD-10-CM

## 2020-01-26 ENCOUNTER — Other Ambulatory Visit: Payer: Self-pay | Admitting: Family Medicine

## 2020-02-12 ENCOUNTER — Other Ambulatory Visit: Payer: Self-pay

## 2020-02-12 ENCOUNTER — Encounter: Payer: Self-pay | Admitting: Family Medicine

## 2020-02-12 ENCOUNTER — Ambulatory Visit (INDEPENDENT_AMBULATORY_CARE_PROVIDER_SITE_OTHER): Payer: Medicare Other | Admitting: Family Medicine

## 2020-02-12 ENCOUNTER — Other Ambulatory Visit: Payer: Self-pay | Admitting: Family Medicine

## 2020-02-12 VITALS — BP 119/70 | HR 66 | Temp 97.7°F | Ht 62.0 in | Wt 208.0 lb

## 2020-02-12 DIAGNOSIS — F329 Major depressive disorder, single episode, unspecified: Secondary | ICD-10-CM

## 2020-02-12 DIAGNOSIS — Z0289 Encounter for other administrative examinations: Secondary | ICD-10-CM

## 2020-02-12 DIAGNOSIS — E1159 Type 2 diabetes mellitus with other circulatory complications: Secondary | ICD-10-CM

## 2020-02-12 DIAGNOSIS — F5104 Psychophysiologic insomnia: Secondary | ICD-10-CM

## 2020-02-12 DIAGNOSIS — E1169 Type 2 diabetes mellitus with other specified complication: Secondary | ICD-10-CM

## 2020-02-12 DIAGNOSIS — I10 Essential (primary) hypertension: Secondary | ICD-10-CM

## 2020-02-12 DIAGNOSIS — M0579 Rheumatoid arthritis with rheumatoid factor of multiple sites without organ or systems involvement: Secondary | ICD-10-CM

## 2020-02-12 DIAGNOSIS — G629 Polyneuropathy, unspecified: Secondary | ICD-10-CM

## 2020-02-12 DIAGNOSIS — E1165 Type 2 diabetes mellitus with hyperglycemia: Secondary | ICD-10-CM

## 2020-02-12 DIAGNOSIS — F32A Depression, unspecified: Secondary | ICD-10-CM

## 2020-02-12 DIAGNOSIS — Z79891 Long term (current) use of opiate analgesic: Secondary | ICD-10-CM | POA: Diagnosis not present

## 2020-02-12 DIAGNOSIS — E785 Hyperlipidemia, unspecified: Secondary | ICD-10-CM

## 2020-02-12 LAB — BAYER DCA HB A1C WAIVED: HB A1C (BAYER DCA - WAIVED): 8.1 % — ABNORMAL HIGH (ref <7.0)

## 2020-02-12 MED ORDER — EMPAGLIFLOZIN 25 MG PO TABS: 25.0000 mg | ORAL_TABLET | Freq: Every day | ORAL | 2 refills | Status: DC

## 2020-02-12 MED ORDER — ZOLPIDEM TARTRATE 5 MG PO TABS: 5.0000 mg | ORAL_TABLET | Freq: Every evening | ORAL | 1 refills | Status: DC | PRN

## 2020-02-12 NOTE — Patient Instructions (Signed)
A counselor is going to call you on the phone.  I have increased the Jardiance to 25 mg daily. (you may take 2 of the 10mg  to use up your current supply before switching over to the new pill)  Make sure your are ONLY taking Losartan 100mg  NOT the 50mg  tablets.

## 2020-02-12 NOTE — Progress Notes (Signed)
Subjective: CC: DM2, HTN PCP: [name], DO DVV:[id number] Donna Rogers is Donna 69 y.o. female presenting to clinic today for:  1. Type 2 Diabetes with neuropathy, hyperlipidemia and hypertension:  Patient reports compliance with her glipizide 10 mg twice daily, Jardiance 10mg  (added last visit), Cozaar 100 mg daily (increased last visit), Zocor 40 mg daily.  She has been tolerating the medication changes without difficulty.  She denies any vaginal irritation, dysuria.  Blood sugars have been running anywhere between 180 and 233.  No dizziness.  Last eye exam: home visit w/ insurance  Last foot exam: Up-to-date Last A1c:  Lab Results  Component Value Date   HGBA1C 8.7 (H) 10/11/2019   Nephropathy screen indicated? On ARB Last flu, zoster and/or pneumovax:  Immunization History  Administered Date(s) Administered  . Fluad Quad(high Dose 65+) 05/03/2019  . Influenza,inj,Quad PF,6+ Mos 05/25/2013, 07/31/2015, 06/18/2016, 05/16/2018  . Influenza-Unspecified 05/07/2019  . PPD Test 05/08/2013  . Pneumococcal Conjugate-13 05/16/2018  . Pneumococcal Polysaccharide-23 07/03/2019  . Tdap 12/28/2018  . Zoster Recombinat (Shingrix) 06/06/2019, 06/30/2019, 10/27/2019    2.  Insomnia/rheumatoid arthritis At last visit, we added low-dose Ambien 2.5 mg nightly as needed for refractory symptoms despite good sleep hygiene.  She unfortunately was not able to afford Belsomra due to insurance issues.  During that visit she reported that her anxiety was getting better.  She continues to take Wellbutrin and Celexa.  She notes that the 2-1/2 was insufficient so she increased to 5 mg.  She continues to use this sparingly and has only just refilled the additional refill given last visit in February.  Denies any excessive daytime sedation, falls, visual or auditory hallucinations.  She does report increased depressive symptoms and fatigue.  She is upset because she "cannot do the things that she used to".   She gets upset with her body because she has multiple joint pains from degenerative and rheumatoid arthritic changes.  She was recently taken off of her Enbrel secondary to adverse side effects.  She has follow-up with Dr. Amil Amen, her rheumatologist, soon for discussion of alternatives.  She is not currently seeing Donna counselor but would be willing to see 1.  She reports compliance with her Celexa and Wellbutrin.  ROS: Per HPI  Allergies  Allergen Reactions  . Ciprofloxacin Hcl Rash   Past Medical History:  Diagnosis Date  . Anemia   . Arthritis    rheumatoid  . Depression   . Diabetes ([place])   . Diabetes mellitus without complication ([place])   . GERD (gastroesophageal reflux disease)   . Headache   . Hyperlipidemia   . Hyperplastic rectal polyp 10/21/2015  . Hypertension    pt. denies  . Lymphocytic colitis   . Neuropathy   . Shortness of breath dyspnea    with exertion    Current Outpatient Medications:  .  amLODipine (NORVASC) 5 MG tablet, TAKE 1 TABLET BY MOUTH DAILY., Disp: 90 tablet, Rfl: 0 .  aspirin 81 MG tablet, Take 81 mg by mouth daily., Disp: , Rfl:  .  atenolol (TENORMIN) 25 MG tablet, TAKE 1 TABLET BY MOUTH DAILY., Disp: 90 tablet, Rfl: 0 .  budesonide (ENTOCORT EC) 3 MG 24 hr capsule, TAKE 3 CAPSULES ONCE DAILY, Disp: 90 capsule, Rfl: 0 .  buPROPion (WELLBUTRIN XL) 150 MG 24 hr tablet, TAKE 1 TABLET EVERY DAY, Disp: 30 tablet, Rfl: 0 .  busPIRone (BUSPAR) 10 MG tablet, TAKE  (1)  TABLET TWICE Donna DAY., Disp: 60 tablet,  Rfl: 2 .  cholecalciferol (VITAMIN D) 1000 UNITS tablet, Take 1,000 Units by mouth daily., Disp: , Rfl:  .  citalopram (CELEXA) 40 MG tablet, TAKE 1 TABLET EVERY DAY (Needs to be seen), Disp: 90 tablet, Rfl: 0 .  diphenoxylate-atropine (LOMOTIL) 2.5-0.025 MG tablet, TAKE 1 OR 2 TABLETS FOUR TIMES DAILY AS NEEDED, Disp: 90 tablet, Rfl: 0 .  etanercept (ENBREL) 50 MG/ML injection, Inject 50 mg into the skin once Donna week., Disp: , Rfl:  .  gabapentin  (NEURONTIN) 600 MG tablet, Take 1 tablet (600 mg total) by mouth 3 (three) times daily. Needs to be seen for further refills., Disp: 120 tablet, Rfl: 0 .  glipiZIDE (GLUCOTROL) 10 MG tablet, TAKE (1) TABLET TWICE Donna DAY BEFORE MEALS., Disp: 180 tablet, Rfl: 0 .  HYDROcodone-acetaminophen (NORCO/VICODIN) 5-325 MG per tablet, Take 1 tablet by mouth every 6 (six) hours as needed. (Patient taking differently: Take 1 tablet by mouth every 6 (six) hours as needed for moderate pain. ), Disp: 60 tablet, Rfl: 0 .  JARDIANCE 10 MG TABS tablet, TAKE 1 TABLET DAILY, Disp: 30 tablet, Rfl: 0 .  losartan (COZAAR) 100 MG tablet, Take 1 tablet (100 mg total) by mouth daily., Disp: 90 tablet, Rfl: 3 .  omeprazole (PRILOSEC) 20 MG capsule, TAKE (1) CAPSULE DAILY, Disp: 90 capsule, Rfl: 0 .  simvastatin (ZOCOR) 40 MG tablet, TAKE 1 TABLET DAILY, Disp: 90 tablet, Rfl: 0 .  vitamin C (ASCORBIC ACID) 500 MG tablet, Take 500 mg by mouth daily., Disp: , Rfl:  .  zolpidem (AMBIEN) 5 MG tablet, Take 0.5 tablets (2.5 mg total) by mouth at bedtime as needed for sleep., Disp: 15 tablet, Rfl: 1 Social History   Socioeconomic History  . Marital status: Divorced    Spouse name: Not on file  . Number of children: 1  . Years of education: 11th  . Highest education level: 11th grade  Occupational History  . Occupation: retired  Tobacco Use  . Smoking status: Current Every Day Smoker    Packs/day: 0.75    Years: 46.00    Pack years: 34.50    Types: Cigarettes  . Smokeless tobacco: Current User  . Tobacco comment: less than Donna pack daily. States she cannot afford Chantix.  Vaping Use  . Vaping Use: Never used  Substance and Sexual Activity  . Alcohol use: No    Alcohol/week: 0.0 standard drinks  . Drug use: No  . Sexual activity: Not Currently  Other Topics Concern  . Not on file  Social History Narrative   The patient is divorced she has 1 sonWho is married that she has Donna grandchild.   She does not use alcohol or  drugs she drinks 3-4 caffeinated beverages daily   She is Donna smoker   Social Determinants of Radio broadcast assistant Strain: Medium Risk  . Difficulty of Paying Living Expenses: Somewhat hard  Food Insecurity: No Food Insecurity  . Worried About Charity fundraiser in the Last Year: Never true  . Ran Out of Food in the Last Year: Never true  Transportation Needs: No Transportation Needs  . Lack of Transportation (Medical): No  . Lack of Transportation (Non-Medical): No  Physical Activity: Inactive  . Days of Exercise per Week: 0 days  . Minutes of Exercise per Session: 0 min  Stress: Stress Concern Present  . Feeling of Stress : To some extent  Social Connections: Unknown  . Frequency of Communication with Friends and Family:  More than three times Donna week  . Frequency of Social Gatherings with Friends and Family: Not on file  . Attends Religious Services: Not on file  . Active Member of Clubs or Organizations: No  . Attends Archivist Meetings: Never  . Marital Status: Not on file  Intimate Partner Violence: Not At Risk  . Fear of Current or Ex-Partner: No  . Emotionally Abused: No  . Physically Abused: No  . Sexually Abused: No   Objective: Office vital signs reviewed. BP 119/70   Pulse 66   Temp 97.7 F (36.5 C)   Ht 5\' 2"  (1.575 m)   Wt 208 lb (94.3 kg)   SpO2 93%   BMI 38.04 kg/m   Physical Examination:  General: Awake, alert, obese HEENT: Normal; sclera white, MMM Cardio: regular rate and rhythm, S1S2 heard, no murmurs appreciated Pulm: clear to auscultation bilaterally, no wheezes, rhonchi or rales; normal work of breathing on room air Extremities: warm, well perfused, No edema, cyanosis or clubbing; +1 pulses bilaterally Psych: Mood is depressed.  Patient is intermittently tearful.  She does not appear to be responding to internal stimuli.  She is pleasant and interactive. Depression screen [facility] 2/9 02/12/2020 10/31/2019 10/11/2019  Decreased Interest  0 1 1  Down, Depressed, Hopeless 3 1 1   PHQ - 2 Score 3 2 2   Altered sleeping 2 3 3   Tired, decreased energy 3 2 2   Change in appetite 0 0 0  Feeling bad or failure about yourself  2 1 1   Trouble concentrating 0 1 1  Moving slowly or fidgety/restless 3 0 0  Suicidal thoughts 0 0 0  PHQ-9 Score 13 9 9   Difficult doing work/chores Extremely dIfficult Somewhat difficult Somewhat difficult  Some recent data might be hidden   GAD 7 : Generalized Anxiety Score 02/12/2020 10/11/2019 09/01/2019 05/16/2018  Nervous, Anxious, on Edge 1 2 3  0  Control/stop worrying 2 2 2  0  Worry too much - different things 2 2 2  0  Trouble relaxing 2 2 1 3   Restless 1 0 0 0  Easily annoyed or irritable 3 1 2  0  Afraid - awful might happen 0 1 1 0  Total GAD 7 Score 11 10 11 3   Anxiety Difficulty Very difficult Somewhat difficult Somewhat difficult Not difficult at all    Assessment/ Plan: 69 y.o. female  1. Uncontrolled type 2 diabetes mellitus with hyperglycemia (HCC) Sugar improving some but not quite at goal.  Increase to 25 mg daily of Jardiance.  Plan to follow-up in 3 months. - Bayer DCA Hb A1c Waived - empagliflozin (JARDIANCE) 25 MG TABS tablet; Take 1 tablet (25 mg total) by mouth daily before breakfast.  Dispense: 30 tablet; Refill: 2 - Referral to Chronic Care Management Services  2. Hyperlipidemia associated with type 2 diabetes mellitus ([place]) Continue statin  3. Hypertension associated with diabetes ([place]) Controlled.  Continue current regimen  4. Rheumatoid arthritis involving multiple sites with positive rheumatoid factor ([place]) Unfortunately not controlled currently because she had to come off of Enbrel.  She has follow-up with Dr. Amil Amen.  Will await their recommendations - DG [facility] DEXA; Future - Referral to Chronic Care Management Services  5. Depressive disorder I placed Donna referral to both virtual behavioral health and clinical social work for assistance with counseling services.   Patient was very tearful.  She is currently on dual therapies but symptoms are refractory to this.  I suspect that this is more situational in nature  given the above issues. - Referral to Chronic Care Management Services  6. Psychophysiological insomnia Stable with 5 mg.  We discussed ongoing as needed and sparing use of this medicine.  The national narcotic database was reviewed and there were no red flags.  Donna controlled substance contract and UDS were obtained per office policy. - zolpidem (AMBIEN) 5 MG tablet; Take 1 tablet (5 mg total) by mouth at bedtime as needed for sleep (use sparingly).  Dispense: 20 tablet; Refill: 1  7. Medication management contract signed - ToxASSURE Select 13 (MW), Urine   Orders Placed This Encounter  Procedures  . DG [facility] DEXA    Standing Status:   Future    Standing Expiration Date:   02/11/2021    Order Specific Question:   Reason for Exam (SYMPTOM  OR DIAGNOSIS REQUIRED)    Answer:   screen osteoporosis  . Bayer DCA Hb A1c Waived  . ToxASSURE Select 13 (MW), Urine  . Referral to Chronic Care Management Services    Referral Priority:   Routine    Referral Type:   Consultation    Referral Reason:   Care Coordination    Number of Visits Requested:   1   Meds ordered this encounter  Medications  . zolpidem (AMBIEN) 5 MG tablet    Sig: Take 1 tablet (5 mg total) by mouth at bedtime as needed for sleep (use sparingly).    Dispense:  20 tablet    Refill:  1    Put on file  . empagliflozin (JARDIANCE) 25 MG TABS tablet    Sig: Take 1 tablet (25 mg total) by mouth daily before breakfast.    Dispense:  30 tablet    Refill:  2     Donna Mas Windell Moulding, DO [place] [phone]

## 2020-02-13 ENCOUNTER — Telehealth: Payer: Self-pay | Admitting: Family Medicine

## 2020-02-13 NOTE — Chronic Care Management (AMB) (Signed)
  Care Management   Note  02/13/2020 Name: Donna Rogers MRN: [id number] DOB: 1951/04/12  Donna Rogers is a 69 y.o. year old female who is a primary care patient of [name], DO and is actively engaged with the care management team. I reached out to [name] by phone today to assist with scheduling an initial visit with the Licensed Clinical Social Worker  Follow up plan: Unsuccessful telephone outreach attempt made. A HIPPA compliant phone message was left for the patient providing contact information and requesting a return call.  The care management team will reach out to the patient again over the next 7 days.  If patient returns call to provider office, please advise to call [place] at [phone].  [place], [place] [address] Direct Dial: [phone] [email or web address]: [email or web address]

## 2020-02-13 NOTE — Chronic Care Management (AMB) (Signed)
  Chronic Care Management   Note  02/13/2020 Name: [name] MRN: [id number] DOB: May 25, 1951  Donna Rogers is a 69 y.o. year old female who is a primary care patient of [name], DO and is actively engaged with the care management team. I reached out to [name] by phone today to assist with scheduling an initial visit with the Licensed Clinical Education officer, museum.  Follow up plan: Unsuccessful telephone outreach attempt made. The care management team will reach out to the patient again over the next 7 days. If patient returns call to provider office, please advise to call [place] at [phone].  [place], [place] [address] Direct Dial: [phone] [email or web address] Website: [email or web address]

## 2020-02-14 ENCOUNTER — Telehealth (INDEPENDENT_AMBULATORY_CARE_PROVIDER_SITE_OTHER): Payer: Medicare Other | Admitting: Licensed Clinical Social Worker

## 2020-02-14 DIAGNOSIS — F329 Major depressive disorder, single episode, unspecified: Secondary | ICD-10-CM

## 2020-02-14 DIAGNOSIS — M8580 Other specified disorders of bone density and structure, unspecified site: Secondary | ICD-10-CM | POA: Diagnosis not present

## 2020-02-14 DIAGNOSIS — M5136 Other intervertebral disc degeneration, lumbar region: Secondary | ICD-10-CM | POA: Diagnosis not present

## 2020-02-14 DIAGNOSIS — M0579 Rheumatoid arthritis with rheumatoid factor of multiple sites without organ or systems involvement: Secondary | ICD-10-CM | POA: Diagnosis not present

## 2020-02-14 DIAGNOSIS — F32A Depression, unspecified: Secondary | ICD-10-CM

## 2020-02-14 DIAGNOSIS — M150 Primary generalized (osteo)arthritis: Secondary | ICD-10-CM | POA: Diagnosis not present

## 2020-02-14 DIAGNOSIS — R197 Diarrhea, unspecified: Secondary | ICD-10-CM | POA: Diagnosis not present

## 2020-02-14 NOTE — Telephone Encounter (Signed)
Writer left message encouraging contact °

## 2020-02-15 ENCOUNTER — Other Ambulatory Visit: Payer: Self-pay | Admitting: Family Medicine

## 2020-02-15 DIAGNOSIS — Z1231 Encounter for screening mammogram for malignant neoplasm of breast: Secondary | ICD-10-CM

## 2020-02-16 ENCOUNTER — Ambulatory Visit: Payer: Self-pay | Admitting: Licensed Clinical Social Worker

## 2020-02-16 DIAGNOSIS — E785 Hyperlipidemia, unspecified: Secondary | ICD-10-CM

## 2020-02-16 DIAGNOSIS — F32A Depression, unspecified: Secondary | ICD-10-CM

## 2020-02-16 DIAGNOSIS — E1165 Type 2 diabetes mellitus with hyperglycemia: Secondary | ICD-10-CM

## 2020-02-16 DIAGNOSIS — F411 Generalized anxiety disorder: Secondary | ICD-10-CM

## 2020-02-16 DIAGNOSIS — I152 Hypertension secondary to endocrine disorders: Secondary | ICD-10-CM

## 2020-02-16 NOTE — Patient Instructions (Addendum)
Licensed Clinical Social Worker Visit Information  Materials Provided: No  02/16/2020  Name: KENYONNA MICEK       MRN: [id number]       DOB: 1951-04-20  KRISTIANNE ALBIN is a 69 y.o. year old female who is a primary care patient of [name], DO. The CCM team was consulted for assistance with [employer] .   Review of patient status, including review of consultants reports, other relevant assessments, and collaboration with appropriate care team members and the patient's provider was performed as part of comprehensive patient evaluation and provision of chronic care management services.    SDOH (Social Determinants of Health) assessments performed: No   [name] had received referral for client from Dr. Lajuana Ripple. [name] called client phone number several times today but was not able to speak via phone with client. However, [name] did leave phone message for [place] asking her to please return call to [name] at [phone] to discuss current client needs   Follow Up Plan:  [name] to call client in next 4 weeks to talk with her about CCM program support services  [name] was not able to speak via phone with client today; thus, the patient was not able to verbalize understanding of instructions provided today and was not able to accept or decline a print copy of patient instruction materials.   Norva Riffle.Hazaiah [place] MSW, [name] Licensed Clinical Social Worker [facility] Care Management [phone]

## 2020-02-16 NOTE — Chronic Care Management (AMB) (Signed)
°  Chronic Care Management    Clinical Social Work Follow Up Note  02/16/2020 Name: Donna Rogers MRN: [id number] DOB: 1951-05-19  Donna Rogers is a 69 y.o. year old female who is a primary care patient of [name], DO. The CCM team was consulted for assistance with [employer] .   Review of patient status, including review of consultants reports, other relevant assessments, and collaboration with appropriate care team members and the patient's provider was performed as part of comprehensive patient evaluation and provision of chronic care management services.    SDOH (Social Determinants of Health) assessments performed: No   Outpatient Encounter Medications as of 02/16/2020  Medication Sig   amLODipine (NORVASC) 5 MG tablet TAKE 1 TABLET BY MOUTH DAILY.   aspirin 81 MG tablet Take 81 mg by mouth daily.   atenolol (TENORMIN) 25 MG tablet TAKE 1 TABLET BY MOUTH DAILY.   budesonide (ENTOCORT EC) 3 MG 24 hr capsule TAKE 3 CAPSULES ONCE DAILY   buPROPion (WELLBUTRIN XL) 150 MG 24 hr tablet TAKE 1 TABLET EVERY DAY   busPIRone (BUSPAR) 10 MG tablet TAKE  (1)  TABLET TWICE A DAY.   cholecalciferol (VITAMIN D) 1000 UNITS tablet Take 1,000 Units by mouth daily.   citalopram (CELEXA) 40 MG tablet TAKE 1 TABLET EVERY DAY (Needs to be seen)   diphenoxylate-atropine (LOMOTIL) 2.5-0.025 MG tablet TAKE 1 OR 2 TABLETS FOUR TIMES DAILY AS NEEDED   empagliflozin (JARDIANCE) 25 MG TABS tablet Take 1 tablet (25 mg total) by mouth daily before breakfast.   etanercept (ENBREL) 50 MG/ML injection Inject 50 mg into the skin once a week.   gabapentin (NEURONTIN) 600 MG tablet TAKE (1) TABLET THREE TIMES DAILY.   glipiZIDE (GLUCOTROL) 10 MG tablet TAKE (1) TABLET TWICE A DAY BEFORE MEALS.   HYDROcodone-acetaminophen (NORCO/VICODIN) 5-325 MG per tablet Take 1 tablet by mouth every 6 (six) hours as needed. (Patient taking differently: Take 1 tablet by mouth every 6 (six) hours as  needed for moderate pain. )   JARDIANCE 10 MG TABS tablet TAKE 1 TABLET DAILY   losartan (COZAAR) 100 MG tablet Take 1 tablet (100 mg total) by mouth daily.   omeprazole (PRILOSEC) 20 MG capsule TAKE (1) CAPSULE DAILY   simvastatin (ZOCOR) 40 MG tablet TAKE 1 TABLET DAILY   vitamin C (ASCORBIC ACID) 500 MG tablet Take 500 mg by mouth daily.   zolpidem (AMBIEN) 5 MG tablet Take 1 tablet (5 mg total) by mouth at bedtime as needed for sleep (use sparingly).   No facility-administered encounter medications on file as of 02/16/2020.     [name] had received referral for client from Dr. Lajuana Ripple. [name] called client phone number several times today but was not able to speak via phone with client. However, [name] did leave phone message for Donna Rogers asking her to please return call to [name] at [phone] to discuss current client needs   Follow Up Plan:  [name] to call client in next 4 weeks to talk with her about CCM program support services  Norva Riffle.Karlina Suares MSW, [name] Licensed Clinical Social Worker [facility] Care Management [phone]

## 2020-02-19 LAB — TOXASSURE SELECT 13 (MW), URINE

## 2020-02-21 ENCOUNTER — Telehealth (HOSPITAL_COMMUNITY): Payer: Self-pay | Admitting: Licensed Clinical Social Worker

## 2020-02-21 ENCOUNTER — Other Ambulatory Visit: Payer: Self-pay | Admitting: Family Medicine

## 2020-02-21 DIAGNOSIS — F410 Panic disorder [episodic paroxysmal anxiety] without agoraphobia: Secondary | ICD-10-CM

## 2020-02-21 NOTE — Telephone Encounter (Signed)
Left message encouraging contact 

## 2020-02-22 ENCOUNTER — Telehealth: Payer: Self-pay | Admitting: Family Medicine

## 2020-02-22 ENCOUNTER — Telehealth (HOSPITAL_COMMUNITY): Payer: Self-pay | Admitting: Licensed Clinical Social Worker

## 2020-02-22 DIAGNOSIS — F410 Panic disorder [episodic paroxysmal anxiety] without agoraphobia: Secondary | ICD-10-CM

## 2020-02-22 NOTE — Telephone Encounter (Signed)
°  Prescription Request  02/22/2020  What is the name of the medication or equipment? Diclofenac 75mg   Have you contacted your pharmacy to request a refill? (if applicable) No  Which pharmacy would you like this sent to? [facility]   Patient notified that their request is being sent to the clinical staff for review and that they should receive a response within 2 business days.   Gottschalk's pt.  Please call pt.

## 2020-02-22 NOTE — Telephone Encounter (Signed)
Left message encouraging contact 

## 2020-02-22 NOTE — Telephone Encounter (Signed)
Diclofenac isn't on her current med list.  I don't see that we have ever filled it. I think it may be from her Rheumatologist.  Should she call them for refill?

## 2020-02-23 MED ORDER — DICLOFENAC SODIUM 75 MG PO TBEC
75.0000 mg | DELAYED_RELEASE_TABLET | Freq: Two times a day (BID) | ORAL | 1 refills | Status: DC | PRN
Start: 2020-02-23 — End: 2020-06-05

## 2020-02-23 NOTE — Chronic Care Management (AMB) (Signed)
  Chronic Care Management   Outreach Note  02/23/2020 Name: Donna Rogers MRN: [id number] DOB: May 15, 1951  Donna Rogers is a 69 y.o. year old female who is a primary care patient of [name], DO. I reached out to Donna Rogers by phone today in response to a referral sent by Donna Rogers PCP, [name], DO.     A second unsuccessful telephone outreach was attempted today. The patient was referred to the case management team for assistance with care management and care coordination.   Follow Up Plan: A HIPPA compliant phone message was left for the patient providing contact information and requesting a return call.  If patient returns call to provider office, please advise to call [place]* at [phone].*  [place], [place],  [address] Direct Dial: [email or web address]

## 2020-02-24 ENCOUNTER — Other Ambulatory Visit: Payer: Self-pay | Admitting: Family Medicine

## 2020-02-26 ENCOUNTER — Telehealth (INDEPENDENT_AMBULATORY_CARE_PROVIDER_SITE_OTHER): Payer: Medicare Other | Admitting: Licensed Clinical Social Worker

## 2020-02-26 NOTE — Telephone Encounter (Signed)
Left message encouraging contact 

## 2020-02-28 NOTE — Chronic Care Management (AMB) (Signed)
  Chronic Care Management   Outreach Note  02/28/2020 Name: DEJAI SCHUBACH MRN: [id number] DOB: 1951/02/13  KRYSIA ZAHRADNIK is a 69 y.o. year old female who is a primary care patient of [name], DO. I reached out to [name] by phone today in response to a referral sent by Ms. Winifred Olive Quesnel's PCP, Gottscalk, Ashly M, DO.     Third unsuccessful telephone outreach was attempted today.   Follow Up Plan: Unable to make contact on outreach attempts X3 PCP Ronnie Doss M, DO and [name] [place] Nan notified of failed attempts.   [place], [place] [address] Direct Dial: [phone] [email or web address]: [email or web address]

## 2020-03-01 NOTE — Telephone Encounter (Signed)
Left message encouraging contact 

## 2020-03-04 ENCOUNTER — Encounter: Payer: Self-pay | Admitting: Licensed Clinical Social Worker

## 2020-03-04 ENCOUNTER — Telehealth: Payer: Self-pay | Admitting: Licensed Clinical Social Worker

## 2020-03-04 NOTE — Telephone Encounter (Signed)
Left message encouraging contact 

## 2020-03-06 ENCOUNTER — Ambulatory Visit (INDEPENDENT_AMBULATORY_CARE_PROVIDER_SITE_OTHER): Payer: Medicare Other | Admitting: Licensed Clinical Social Worker

## 2020-03-06 DIAGNOSIS — F329 Major depressive disorder, single episode, unspecified: Secondary | ICD-10-CM

## 2020-03-06 DIAGNOSIS — F411 Generalized anxiety disorder: Secondary | ICD-10-CM

## 2020-03-06 DIAGNOSIS — F32A Depression, unspecified: Secondary | ICD-10-CM

## 2020-03-06 DIAGNOSIS — E785 Hyperlipidemia, unspecified: Secondary | ICD-10-CM

## 2020-03-06 DIAGNOSIS — E1169 Type 2 diabetes mellitus with other specified complication: Secondary | ICD-10-CM | POA: Diagnosis not present

## 2020-03-06 DIAGNOSIS — E1165 Type 2 diabetes mellitus with hyperglycemia: Secondary | ICD-10-CM | POA: Diagnosis not present

## 2020-03-06 NOTE — Chronic Care Management (AMB) (Signed)
Chronic Care Management    Clinical Social Work Follow Up Note  03/06/2020 Name: Donna Rogers MRN: [id number] DOB: Aug 19, 1950  Donna Rogers is a 69 y.o. year old female who is a primary care patient of [name], DO. The CCM team was consulted for assistance with [employer] .   Review of patient status, including review of consultants reports, other relevant assessments, and collaboration with appropriate care team members and the patient's provider was performed as part of comprehensive patient evaluation and provision of chronic care management services.    SDOH (Social Determinants of Health) assessments performed: Yes; risk for tobacco use; risk for stress; risk for depression ;risk for physical inactivity  SDOH Interventions     Most Recent Value  SDOH Interventions  Depression Interventions/Treatment  Medication        Chronic Care Management from 03/06/2020 in [place]  PHQ-9 Total Score 6     GAD 7 : Generalized Anxiety Score 03/06/2020 02/12/2020 10/11/2019 09/01/2019  Nervous, Anxious, on Edge 0 1 2 3   Control/stop worrying 1 2 2 2   Worry too much - different things 0 2 2 2   Trouble relaxing 0 2 2 1   Restless 0 1 0 0  Easily annoyed or irritable 0 3 1 2   Afraid - awful might happen 0 0 1 1  Total GAD 7 Score 1 11 10 11   Anxiety Difficulty Somewhat difficult Very difficult Somewhat difficult Somewhat difficult    Outpatient Encounter Medications as of 03/06/2020  Medication Sig  . amLODipine (NORVASC) 5 MG tablet TAKE 1 TABLET BY MOUTH DAILY.  Marland Kitchen aspirin 81 MG tablet Take 81 mg by mouth daily.  Marland Kitchen atenolol (TENORMIN) 25 MG tablet TAKE 1 TABLET BY MOUTH DAILY.  . budesonide (ENTOCORT EC) 3 MG 24 hr capsule TAKE 3 CAPSULES ONCE DAILY  . buPROPion (WELLBUTRIN XL) 150 MG 24 hr tablet TAKE 1 TABLET EVERY DAY  . busPIRone (BUSPAR) 10 MG tablet TAKE  (1)  TABLET TWICE A DAY.  . cholecalciferol (VITAMIN D) 1000 UNITS tablet  Take 1,000 Units by mouth daily.  . citalopram (CELEXA) 40 MG tablet TAKE 1 TABLET EVERY DAY (Needs to be seen)  . diclofenac (VOLTAREN) 75 MG EC tablet Take 1 tablet (75 mg total) by mouth 2 (two) times daily as needed for moderate pain (use sparingly).  Marland Kitchen diphenoxylate-atropine (LOMOTIL) 2.5-0.025 MG tablet TAKE 1 OR 2 TABLETS FOUR TIMES DAILY AS NEEDED  . empagliflozin (JARDIANCE) 25 MG TABS tablet Take 1 tablet (25 mg total) by mouth daily before breakfast.  . etanercept (ENBREL) 50 MG/ML injection Inject 50 mg into the skin once a week.  . gabapentin (NEURONTIN) 600 MG tablet TAKE (1) TABLET THREE TIMES DAILY.  Marland Kitchen glipiZIDE (GLUCOTROL) 10 MG tablet TAKE (1) TABLET TWICE A DAY BEFORE MEALS.  . HYDROcodone-acetaminophen (NORCO/VICODIN) 5-325 MG per tablet Take 1 tablet by mouth every 6 (six) hours as needed. (Patient taking differently: Take 1 tablet by mouth every 6 (six) hours as needed for moderate pain. )  . JARDIANCE 10 MG TABS tablet TAKE 1 TABLET DAILY  . losartan (COZAAR) 100 MG tablet Take 1 tablet (100 mg total) by mouth daily.  Marland Kitchen omeprazole (PRILOSEC) 20 MG capsule TAKE (1) CAPSULE DAILY  . simvastatin (ZOCOR) 40 MG tablet TAKE 1 TABLET DAILY  . vitamin C (ASCORBIC ACID) 500 MG tablet Take 500 mg by mouth daily.  Marland Kitchen zolpidem (AMBIEN) 5 MG tablet Take 1 tablet (5 mg total)  by mouth at bedtime as needed for sleep (use sparingly).   No facility-administered encounter medications on file as of 03/06/2020.     Goals Addressed              This Visit's Progress   .  Client will talk with [name] in next 30 days to discuss health needs of client and managing her health needs (pt-stated)        CARE PLAN ENTRY   Current Barriers:  . Pain needs (feet,back) of client with chronic diagnoses of GERD, Edema, HTN, Depression, DM Type 2, HLD, GAD . Mobility issues   Clinical Social Work Clinical Goal(s):  Marland Kitchen [name] to call client in next 30 days to talk about health needs of client and  managing health needs of client  Interventions: . Talked with client about pain issues of client . Talked with client about mobility issues of client (uses a walker as needed) . Encouraged client to call RNCM as needed for nursing support . Talked with client about upcoming client medical appointments . Talked with client about transport needs of client . Talked with client about support from Gastroenterologist  . Talked with client about relaxation techniques (watches TV, take a nap, visiting with son who lives nearby, sits on porch) . Talked with client about her difficulty cooking . Provided counseling support for client . Talked with [name] about her managing arthritis . Talked with client about grief issues faced by client . Talked with client about memory challenges of client (client said she has challenges with recent memory but can recall memories from the past) . Talked with [name] about medication administration (she said she uses a pill box to help with taking her meds ) . Talked with [name] about expense of obtaining Jardiance monthly   Patient Self Care Activities:   Completes ADLs as needed Attends medical appointments   Patient Self Care Deficits:  . Pain issues . Difficulty in mobility  Initial goal documentation        Follow Up Plan: [name] to call client in next 4 weeks to talk with client about health needs of client and her management of health needs faced  Norva Riffle.Maddix Heinz MSW, [name] Licensed Clinical Social Worker [facility] [phone]

## 2020-03-06 NOTE — Patient Instructions (Addendum)
Licensed Clinical Social Worker Visit Information  Goals we discussed today:  Goals Addressed              This Visit's Progress   .  Client will talk with [name] in next 30 days to discuss health needs of client and managing her health needs (pt-stated)        CARE PLAN ENTRY   Current Barriers:  . Pain needs (feet,back) . Mobility issues   Clinical Social Work Clinical Goal(s):  Marland Kitchen [name] to call client in next 30 days to talk about health needs of client and managing health needs of client  Interventions: . Talked with client about pain issues of client . Talked with client about mobility issues of client (uses a walker as needed) . Encouraged client to call RNCM as needed for nursing support . Talked with client about upcoming client medical appointments . Talked with client about transport needs of client . Talked with client about support from Gastroenterologist  . Talked with client about relaxation techniques (watches TV, take a nap, visiting with son who lives nearby, sits on porch) . Talked with client about her difficulty cooking . Provided counseling support for client . Talked with [name] about her managing arthritis . Talked with client about grief issues faced by client . Talked with client about memory challenges of client (client said she has challenges with recent memory but can recall memories from the past) . Talked with [name] about medication administration (she said she uses a pill box to help with taking her meds ) . Talked with [name] about expense of obtaining Jardiance monthly   Patient Self Care Activities:   Completes ADLs as needed Attends medical appointments   Patient Self Care Deficits:  . Pain issues . Difficulty in mobility  Initial goal documentation     Materials Provided: No   Follow Up Plan: [name] to call client in next 4 weeks to talk with client about health needs of client and her management of health needs faced  The patient  verbalized understanding of instructions provided today and declined a print copy of patient instruction materials.   Norva Riffle.Dante Roudebush MSW, [name] Licensed Clinical Social Worker [facility] [phone]

## 2020-03-07 ENCOUNTER — Other Ambulatory Visit: Payer: Self-pay | Admitting: Family Medicine

## 2020-03-07 DIAGNOSIS — K2100 Gastro-esophageal reflux disease with esophagitis, without bleeding: Secondary | ICD-10-CM

## 2020-03-19 ENCOUNTER — Other Ambulatory Visit: Payer: Self-pay | Admitting: Family Medicine

## 2020-03-27 ENCOUNTER — Encounter: Payer: Self-pay | Admitting: Gastroenterology

## 2020-03-27 ENCOUNTER — Other Ambulatory Visit: Payer: Medicare Other

## 2020-03-27 ENCOUNTER — Ambulatory Visit (INDEPENDENT_AMBULATORY_CARE_PROVIDER_SITE_OTHER): Payer: Medicare Other | Admitting: Gastroenterology

## 2020-03-27 VITALS — BP 140/80 | HR 82 | Ht 61.0 in | Wt 205.0 lb

## 2020-03-27 DIAGNOSIS — R197 Diarrhea, unspecified: Secondary | ICD-10-CM

## 2020-03-27 DIAGNOSIS — R1310 Dysphagia, unspecified: Secondary | ICD-10-CM | POA: Diagnosis not present

## 2020-03-27 DIAGNOSIS — K52832 Lymphocytic colitis: Secondary | ICD-10-CM

## 2020-03-27 MED ORDER — BUDESONIDE 3 MG PO CPEP: 9.0000 mg | ORAL_CAPSULE | Freq: Every day | ORAL | 2 refills | Status: DC

## 2020-03-27 MED ORDER — DIPHENOXYLATE-ATROPINE 2.5-0.025 MG PO TABS: 1.0000 | ORAL_TABLET | Freq: Three times a day (TID) | ORAL | 1 refills | Status: DC | PRN

## 2020-03-27 NOTE — Patient Instructions (Signed)
If you are age 67 or older, your body mass index should be between 23-30. Your Body mass index is 38.73 kg/m. If this is out of the aforementioned range listed, please consider follow up with your Primary Care Provider.  If you are age 43 or younger, your body mass index should be between 19-25. Your Body mass index is 38.73 kg/m. If this is out of the aformentioned range listed, please consider follow up with your Primary Care Provider.   Your provider has requested that you go to the basement level for lab work before leaving today. Press "B" on the elevator. The lab is located at the first door on the left as you exit the elevator.  Restart Budesonide 9 mg daily.   Lomotil every 8 hours as needed.  You have been scheduled for a Barium Esophogram at [facility] (1st floor of the hospital) on Wednesday 04/03/20 at 11 am. Please arrive 15 minutes prior to your appointment for registration. Make certain not to have anything to eat or drink 3 hours prior to your test. If you need to reschedule for any reason, please contact radiology at [phone] to do so. __________________________________________________________________ A barium swallow is an examination that concentrates on views of the esophagus. This tends to be a double contrast exam (barium and two liquids which, when combined, create a gas to distend the wall of the oesophagus) or single contrast (non-ionic iodine based). The study is usually tailored to your symptoms so a good history is essential. Attention is paid during the study to the form, structure and configuration of the esophagus, looking for functional disorders (such as aspiration, dysphagia, achalasia, motility and reflux) EXAMINATION You may be asked to change into a gown, depending on the type of swallow being performed. A radiologist and radiographer will perform the procedure. The radiologist will advise you of the type of contrast selected for your procedure and  direct you during the exam. You will be asked to stand, sit or lie in several different positions and to hold a small amount of fluid in your mouth before being asked to swallow while the imaging is performed .In some instances you may be asked to swallow barium coated marshmallows to assess the motility of a solid food bolus. The exam can be recorded as a digital or video fluoroscopy procedure. POST PROCEDURE It will take 1-2 days for the barium to pass through your system. To facilitate this, it is important, unless otherwise directed, to increase your fluids for the next 24-48hrs and to resume your normal diet.  This test typically takes about 30 minutes to perform. _________________________________________________________________

## 2020-03-27 NOTE — Progress Notes (Signed)
03/27/2020 Donna Rogers [id number] December 10, 1950   HISTORY OF PRESENT ILLNESS: This is a pleasant 69 year old female who is a patient of [name].  She has a history of lymphocytic colitis, seen on biopsies during colonoscopy in 2017.  Has been treated on a few occasions in the past, the last one was about a year ago.  She improves with budesonide and uses Lomotil as needed as well.  She has been having diarrhea again now for the past several months, 7 or more times daily with nocturnal stools as well.  No abdominal pain or bleeding.  While she is here she also reports intermittent issues with swallowing solid foods for the past couple of months.  She says that on occasion it will feel like something will get stuck when she swallows it.  This is not even happening once a week.  No issues with swallowing liquids.  Is on omeprazole 20 mg daily.   Past Medical History:  Diagnosis Date  . Anemia   . Arthritis    rheumatoid  . Depression   . Diabetes ([place])   . Diabetes mellitus without complication ([place])   . GERD (gastroesophageal reflux disease)   . Headache   . Hyperlipidemia   . Hyperplastic rectal polyp 10/21/2015  . Hypertension    pt. denies  . Lymphocytic colitis   . Neuropathy   . Shortness of breath dyspnea    with exertion   Past Surgical History:  Procedure Laterality Date  . BREAST LUMPECTOMY Right 2018  . BREAST LUMPECTOMY WITH RADIOACTIVE SEED LOCALIZATION Left 03/03/2016   Procedure: BREAST LUMPECTOMY WITH RADIOACTIVE SEED LOCALIZATION;  Surgeon: Coralie Keens, MD;  Location: [facility];  Service: General;  Laterality: Left;  . COLONOSCOPY  08/2002   RMR: internal hemorrhoids  . COLONOSCOPY N/A 10/21/2015   Procedure: COLONOSCOPY;  Surgeon: Daneil Dolin, MD;  [facility];  Service: Endoscopy;  Laterality: N/A;  250 - moved to 2:35 - office to notify pt  . CYST REMOVAL TRUNK    . ESOPHAGOGASTRODUODENOSCOPY  08/2002   RMR: nonerosive reflux  esophagitis, hiatal hernia  . TONSILLECTOMY      reports that she has been smoking cigarettes. She has a 34.50 pack-year smoking history. She has never used smokeless tobacco. She reports that she does not drink alcohol and does not use drugs. family history includes Alzheimer's disease in her mother; Arthritis in her sister; Diabetes in her brother, father, and sister; Heart disease in her father; Hypertension in her brother; Lung cancer in her mother. Allergies  Allergen Reactions  . Ciprofloxacin Hcl Rash      Outpatient Encounter Medications as of 03/27/2020  Medication Sig  . amLODipine (NORVASC) 5 MG tablet TAKE 1 TABLET BY MOUTH DAILY.  Marland Kitchen aspirin 81 MG tablet Take 81 mg by mouth daily.  Marland Kitchen atenolol (TENORMIN) 25 MG tablet TAKE 1 TABLET BY MOUTH DAILY.  . budesonide (ENTOCORT EC) 3 MG 24 hr capsule TAKE 3 CAPSULES ONCE DAILY  . buPROPion (WELLBUTRIN XL) 150 MG 24 hr tablet TAKE 1 TABLET EVERY DAY  . busPIRone (BUSPAR) 10 MG tablet TAKE  (1)  TABLET TWICE A DAY.  . cholecalciferol (VITAMIN D) 1000 UNITS tablet Take 1,000 Units by mouth daily.  . citalopram (CELEXA) 40 MG tablet TAKE 1 TABLET EVERY DAY (Needs to be seen)  . diclofenac (VOLTAREN) 75 MG EC tablet Take 1 tablet (75 mg total) by mouth 2 (two) times daily as needed for moderate pain (  use sparingly).  Marland Kitchen diphenoxylate-atropine (LOMOTIL) 2.5-0.025 MG tablet TAKE 1 OR 2 TABLETS FOUR TIMES DAILY AS NEEDED  . empagliflozin (JARDIANCE) 25 MG TABS tablet Take 1 tablet (25 mg total) by mouth daily before breakfast.  . gabapentin (NEURONTIN) 600 MG tablet TAKE (1) TABLET THREE TIMES DAILY.  Marland Kitchen glipiZIDE (GLUCOTROL) 10 MG tablet TAKE (1) TABLET TWICE A DAY BEFORE MEALS.  . HYDROcodone-acetaminophen (NORCO/VICODIN) 5-325 MG per tablet Take 1 tablet by mouth every 6 (six) hours as needed. (Patient taking differently: Take 1 tablet by mouth every 6 (six) hours as needed for moderate pain. )  . JARDIANCE 10 MG TABS tablet TAKE 1 TABLET DAILY   . losartan (COZAAR) 100 MG tablet Take 1 tablet (100 mg total) by mouth daily.  Marland Kitchen omeprazole (PRILOSEC) 20 MG capsule TAKE (1) CAPSULE DAILY  . simvastatin (ZOCOR) 40 MG tablet TAKE 1 TABLET DAILY  . vitamin C (ASCORBIC ACID) 500 MG tablet Take 500 mg by mouth daily.  Marland Kitchen zolpidem (AMBIEN) 5 MG tablet Take 1 tablet (5 mg total) by mouth at bedtime as needed for sleep (use sparingly).  . [DISCONTINUED] etanercept (ENBREL) 50 MG/ML injection Inject 50 mg into the skin once a week.   No facility-administered encounter medications on file as of 03/27/2020.    REVIEW OF SYSTEMS  : All other systems reviewed and negative except where noted in the History of Present Illness.   PHYSICAL EXAM: BP 140/80   Pulse 82   Ht 5\' 1"  (1.549 m)   Wt 205 lb (93 kg)   BMI 38.73 kg/m  General: Well developed white female in no acute distress Head: Normocephalic and atraumatic Eyes:  Sclerae anicteric, conjunctiva pink. Ears: Normal auditory acuity Lungs: Clear throughout to auscultation; no increased WOB. Heart: Regular rate and rhythm; no M/R/G. Abdomen: Soft, non-distended.  BS present.  Non-tender. Musculoskeletal: Symmetrical with no gross deformities  Skin: No lesions on visible extremities Extremities: No edema  Neurological: Alert oriented x 4, grossly non-focal Psychological:  Alert and cooperative. Normal mood and affect  ASSESSMENT AND PLAN: *Recurrent diarrhea: Has several year history of issues with recurrent lymphocytic colitis.  Likely this is a recurrent issue at this time as well.  I am going to restart budesonide at 9 mg daily.  Prescription sent to the pharmacy.  I am also sending her a prescription for Lomotil, which she is used in the past.  I would like her to submit a stool study for C. difficile, however, just to be sure there is nothing else going on here especially since she is planning to start Humira via her rheumatologist in the near future. *Dysphagia:  New for about the past  few months.  Intermittent dysphagia to solid food, not even occurring once a week.  We will start with a barium esophagram with tablet.  Question if this is just a motility disorder.   CC:  [name], DO

## 2020-03-28 ENCOUNTER — Telehealth: Payer: Self-pay | Admitting: Family Medicine

## 2020-03-29 ENCOUNTER — Other Ambulatory Visit: Payer: Self-pay

## 2020-03-29 DIAGNOSIS — G629 Polyneuropathy, unspecified: Secondary | ICD-10-CM

## 2020-03-29 MED ORDER — GABAPENTIN 600 MG PO TABS: 1200.0000 mg | ORAL_TABLET | Freq: Three times a day (TID) | ORAL | 3 refills | Status: DC

## 2020-03-29 NOTE — Telephone Encounter (Signed)
This appears to be a clerical error in the refill pools.  Please change to 2 tablets TID as this is what patient had been on. #180 w/ 3 rf

## 2020-03-29 NOTE — Telephone Encounter (Signed)
Gabapentin 600mg    Two tabs TID #180 with 3 refills sent to [facility]

## 2020-03-30 ENCOUNTER — Other Ambulatory Visit: Payer: Self-pay | Admitting: Family Medicine

## 2020-04-02 ENCOUNTER — Other Ambulatory Visit: Payer: Medicare Other

## 2020-04-02 DIAGNOSIS — K52832 Lymphocytic colitis: Secondary | ICD-10-CM

## 2020-04-02 DIAGNOSIS — R197 Diarrhea, unspecified: Secondary | ICD-10-CM | POA: Diagnosis not present

## 2020-04-03 ENCOUNTER — Ambulatory Visit (HOSPITAL_COMMUNITY)
Admission: RE | Admit: 2020-04-03 | Discharge: 2020-04-03 | Disposition: A | Discharge status: Home / Self Care | Payer: Medicare Other | Source: Ambulatory Visit | Attending: Gastroenterology | Admitting: Gastroenterology

## 2020-04-03 ENCOUNTER — Other Ambulatory Visit: Payer: Self-pay

## 2020-04-03 DIAGNOSIS — R1310 Dysphagia, unspecified: Secondary | ICD-10-CM | POA: Insufficient documentation

## 2020-04-03 DIAGNOSIS — K222 Esophageal obstruction: Secondary | ICD-10-CM | POA: Diagnosis not present

## 2020-04-03 LAB — CLOSTRIDIUM DIFFICILE TOXIN B, QUALITATIVE, REAL-TIME PCR: Toxigenic C. Difficile by PCR: NOT DETECTED

## 2020-04-09 ENCOUNTER — Telehealth: Payer: Self-pay | Admitting: Pharmacist

## 2020-04-09 ENCOUNTER — Ambulatory Visit (INDEPENDENT_AMBULATORY_CARE_PROVIDER_SITE_OTHER): Payer: Medicare Other | Admitting: Licensed Clinical Social Worker

## 2020-04-09 DIAGNOSIS — F32A Depression, unspecified: Secondary | ICD-10-CM

## 2020-04-09 DIAGNOSIS — I10 Essential (primary) hypertension: Secondary | ICD-10-CM

## 2020-04-09 DIAGNOSIS — E1159 Type 2 diabetes mellitus with other circulatory complications: Secondary | ICD-10-CM

## 2020-04-09 DIAGNOSIS — E785 Hyperlipidemia, unspecified: Secondary | ICD-10-CM | POA: Diagnosis not present

## 2020-04-09 DIAGNOSIS — I152 Hypertension secondary to endocrine disorders: Secondary | ICD-10-CM

## 2020-04-09 DIAGNOSIS — E1165 Type 2 diabetes mellitus with hyperglycemia: Secondary | ICD-10-CM | POA: Diagnosis not present

## 2020-04-09 DIAGNOSIS — R1310 Dysphagia, unspecified: Secondary | ICD-10-CM

## 2020-04-09 DIAGNOSIS — F329 Major depressive disorder, single episode, unspecified: Secondary | ICD-10-CM

## 2020-04-09 DIAGNOSIS — E1169 Type 2 diabetes mellitus with other specified complication: Secondary | ICD-10-CM

## 2020-04-09 DIAGNOSIS — F410 Panic disorder [episodic paroxysmal anxiety] without agoraphobia: Secondary | ICD-10-CM

## 2020-04-09 NOTE — Chronic Care Management (AMB) (Signed)
Chronic Care Management    Clinical Social Work Follow Up Note  04/09/2020 Name: Donna Rogers MRN: [id number] DOB: July 08, 1951  Donna Rogers is a 69 y.o. year old female who is a primary care patient of [name], DO. The CCM team was consulted for assistance with [employer] .   Review of patient status, including review of consultants reports, other relevant assessments, and collaboration with appropriate care team members and the patient's provider was performed as part of comprehensive patient evaluation and provision of chronic care management services.    SDOH (Social Determinants of Health) assessments performed: No; risk for tobacco use; risk for depression; risk for stress; risk for physical inactivity    Chronic Care Management from 03/06/2020 in [place]  PHQ-9 Total Score 6       GAD 7 : Generalized Anxiety Score 03/06/2020 02/12/2020 10/11/2019 09/01/2019  Nervous, Anxious, on Edge 0 1 2 3   Control/stop worrying 1 2 2 2   Worry too much - different things 0 2 2 2   Trouble relaxing 0 2 2 1   Restless 0 1 0 0  Easily annoyed or irritable 0 3 1 2   Afraid - awful might happen 0 0 1 1  Total GAD 7 Score 1 11 10 11   Anxiety Difficulty Somewhat difficult Very difficult Somewhat difficult Somewhat difficult    Outpatient Encounter Medications as of 04/09/2020  Medication Sig  . amLODipine (NORVASC) 5 MG tablet TAKE 1 TABLET BY MOUTH DAILY.  Marland Kitchen aspirin 81 MG tablet Take 81 mg by mouth daily.  Marland Kitchen atenolol (TENORMIN) 25 MG tablet TAKE 1 TABLET BY MOUTH DAILY.  . budesonide (ENTOCORT EC) 3 MG 24 hr capsule Take 3 capsules (9 mg total) by mouth daily.  Marland Kitchen buPROPion (WELLBUTRIN XL) 150 MG 24 hr tablet TAKE 1 TABLET EVERY DAY  . busPIRone (BUSPAR) 10 MG tablet TAKE  (1)  TABLET TWICE A DAY.  . cholecalciferol (VITAMIN D) 1000 UNITS tablet Take 1,000 Units by mouth daily.  . citalopram (CELEXA) 40 MG tablet TAKE 1 TABLET EVERY DAY (Needs to  be seen)  . diclofenac (VOLTAREN) 75 MG EC tablet Take 1 tablet (75 mg total) by mouth 2 (two) times daily as needed for moderate pain (use sparingly).  Marland Kitchen diphenoxylate-atropine (LOMOTIL) 2.5-0.025 MG tablet Take 1 tablet by mouth every 8 (eight) hours as needed for diarrhea or loose stools. TAKE 1 OR 2 TABLETS FOUR TIMES DAILY AS NEEDED  . empagliflozin (JARDIANCE) 25 MG TABS tablet Take 1 tablet (25 mg total) by mouth daily before breakfast.  . gabapentin (NEURONTIN) 600 MG tablet Take 2 tablets (1,200 mg total) by mouth 3 (three) times daily.  Marland Kitchen glipiZIDE (GLUCOTROL) 10 MG tablet TAKE (1) TABLET TWICE A DAY BEFORE MEALS.  . HYDROcodone-acetaminophen (NORCO/VICODIN) 5-325 MG per tablet Take 1 tablet by mouth every 6 (six) hours as needed. (Patient taking differently: Take 1 tablet by mouth every 6 (six) hours as needed for moderate pain. )  . JARDIANCE 10 MG TABS tablet TAKE 1 TABLET DAILY  . losartan (COZAAR) 100 MG tablet Take 1 tablet (100 mg total) by mouth daily.  Marland Kitchen omeprazole (PRILOSEC) 20 MG capsule TAKE (1) CAPSULE DAILY  . simvastatin (ZOCOR) 40 MG tablet TAKE 1 TABLET DAILY  . vitamin C (ASCORBIC ACID) 500 MG tablet Take 500 mg by mouth daily.  Marland Kitchen zolpidem (AMBIEN) 5 MG tablet Take 1 tablet (5 mg total) by mouth at bedtime as needed for sleep (use sparingly).  No facility-administered encounter medications on file as of 04/09/2020.    Goals Addressed              This Visit's Progress   .  Client will talk with [name] in next 30 days to discuss health needs of client and managing her health needs (pt-stated)        CARE PLAN ENTRY   Current Barriers:  . Pain needs (feet,back) . Mobility issues of client with chronic diagnoses of Dysphagia, GAD, HLD, DM Type 2, Depressive Disorder, GERD, Obesity , Edema, HTN  Clinical Social Work Clinical Goal(s):  Marland Kitchen [name] to call client in next 30 days to talk about health needs of client and managing health needs of  client  Interventions: . Talked with client about pain issues of client . Talked with client about mobility issues of client (uses a walker as needed) . Encouraged client to call RNCM as needed for nursing support . Talked with client about upcoming client medical appointments . Talked with client about transport needs of client . Talked with client about support from Gastroenterologist  . Talked with client about relaxation techniques (watches TV, take a nap, visiting with son who lives nearby, sits on porch) . Talked with client about her difficulty cooking . Provided counseling support for client . Talked with [name] about her managing arthritis . Talked with client about memory challenges of client (client said she has challenges with recent memory but can recall memories from the past) . Talked with [name] about medication administration (she said she uses a pill box to help with taking her meds ) . Talked with [name] about expense of obtaining Jardiance monthly . Collaborated with Dr. Lottie Dawson, pharmacist, regarding medication needs of client (client said Vania Rea was expensive and she had some difficulty paying for Jardiance monthly)  Patient Self Care Activities:   Completes ADLs as needed Attends medical appointments   Patient Self Care Deficits:  . Pain issues . Difficulty in mobility  Initial goal documentation    Follow Up Plan: [name] to call client in next 4 weeks to talk with client about health needs of client and her management of health needs faced  Norva Riffle.Makani Seckman MSW, [name] Licensed Clinical Social Worker [facility] [phone]

## 2020-04-09 NOTE — Telephone Encounter (Signed)
Jardiance is >$47 /month Appt scheduled with PharmD tomorrow

## 2020-04-09 NOTE — Patient Instructions (Addendum)
Licensed Clinical Social Worker Visit Information  Goals we discussed today:  Goals Addressed              This Visit's Progress   .  Client will talk with [name] in next 30 days to discuss health needs of client and managing her health needs (pt-stated)        CARE PLAN ENTRY   Current Barriers:  . Pain needs (feet,back) . Mobility issues of client with chronic diagnoses of Dysphagia, GAD, HLD, DM Type 2, Depressive Disorder, GERD, Obesity , Edema, HTN  Clinical Social Work Clinical Goal(s):  Marland Kitchen [name] to call client in next 30 days to talk about health needs of client and managing health needs of client  Interventions: . Talked with client about pain issues of client . Talked with client about mobility issues of client (uses a walker as needed) . Encouraged client to call RNCM as needed for nursing support . Talked with client about upcoming client medical appointments . Talked with client about transport needs of client . Talked with client about support from Gastroenterologist  . Talked with client about relaxation techniques (watches TV, take a nap, visiting with son who lives nearby, sits on porch) . Talked with client about her difficulty cooking . Provided counseling support for client . Talked with [name] about her managing arthritis . Talked with client about memory challenges of client (client said she has challenges with recent memory but can recall memories from the past) . Talked with [name] about medication administration (she said she uses a pill box to help with taking her meds )  .  Talked with [name] about expense of obtaining Jardiance monthly     Collaborated with Dr. Lottie Dawson, pharmacist, regarding medication needs of client (client said Vania Rea was expensive and she had some difficulty paying for Jardiance monthly)  Patient Self Care Activities:   Completes ADLs as needed Attends medical appointments   Patient Self Care Deficits:  . Pain  issues . Difficulty in mobility  Initial goal documentation        Materials Provided: No  Follow Up Plan: [name] to call client in next 4 weeks to talk with client about health needs of client and her management of health needs faced  The patient verbalized understanding of instructions provided today and declined a print copy of patient instruction materials.   Norva Riffle.Tulsi Crossett MSW, [name] Licensed Clinical Social Worker [facility] [phone]

## 2020-04-10 ENCOUNTER — Ambulatory Visit: Payer: Self-pay | Admitting: Pharmacist

## 2020-04-11 ENCOUNTER — Ambulatory Visit (INDEPENDENT_AMBULATORY_CARE_PROVIDER_SITE_OTHER): Payer: Medicare Other | Admitting: Pharmacist

## 2020-04-11 ENCOUNTER — Other Ambulatory Visit: Payer: Self-pay

## 2020-04-11 DIAGNOSIS — E119 Type 2 diabetes mellitus without complications: Secondary | ICD-10-CM | POA: Diagnosis not present

## 2020-04-11 NOTE — Progress Notes (Signed)
    04/11/2020 Name: Donna Rogers MRN: [id number] DOB: 01/14/1951   S:  41 yoF presents for diabetes evaluation, education, and management Patient was referred and last seen by Primary Care Provider on 02/12/20.  Insurance coverage/medication affordability: [employer] medicare  Patient reports adherence with medications. . Current diabetes medications include: jardiance (cost issues), glipizide . Current hypertension medications include: losartan Goal 130/80 . Current hyperlipidemia medications include: simvastatin   Patient denies hypoglycemic events.   Patient reported dietary habits: Eats 2-3 Discussed meal planning options and Plate method for healthy eating . Avoid sugary drinks and desserts . Incorporate balanced protein, non starchy veggies, 1 serving of carbohydrate with each meal . Increase water intake . Increase physical activity as able  meals/day The patient is asked to make an attempt to improve diet and exercise patterns to aid in medical management of this problem.  O:  Lab Results  Component Value Date   HGBA1C 8.1 (H) 02/12/2020    BP 170/85  Lipid Panel     Component Value Date/Time   CHOL 116 08/26/2017 1123   TRIG 114 08/26/2017 1123   HDL 27 (L) 08/26/2017 1123   CHOLHDL 4.3 08/26/2017 1123   LDLCALC 66 08/26/2017 1123     Home fasting blood sugars: 138  2 hour post-meal/random blood sugars: N/A.    A/P:  Diabetes T2DM currently uncontrolled.  Patient has been unable to take jardiance consistently due to cost  -Continued SGLT2-I Jardiance 25mg  (generic name dapagliflozin) . Patient has been unable to take due to cost Counseled Sick day rules: if sick, vomiting, have diarrhea, or  cannot drink enough fluids, you should stop taking SGLT-2 inhibitors until your symptoms go away. In rare cases, these medicines can cause diabetic ketoacidosis (DKA). DKA is acid buildup in the blood.  -patient assistance application submitted for BI for free  jardiance  -samples given [id number], 9/23  -Extensively discussed pathophysiology of diabetes, recommended lifestyle interventions, dietary effects on blood sugar control  -Counseled on s/sx of and management of hypoglycemia  -Next A1C anticipated 05/13/20.  Written patient instructions provided.  Total time in face to face counseling 28 minutes.   Follow up PCP Clinic Visit ON 05/13/20  Regina Eck, PharmD, BCPS Clinical Pharmacist, [place]  II Phone [phone]

## 2020-04-12 NOTE — Progress Notes (Signed)
Looks like Ba swallow on 8/18 indicates a stricture so should set up an EGD  Thanks

## 2020-04-15 ENCOUNTER — Telehealth: Payer: Self-pay | Admitting: Internal Medicine

## 2020-04-15 DIAGNOSIS — Z1159 Encounter for screening for other viral diseases: Secondary | ICD-10-CM

## 2020-04-15 NOTE — Telephone Encounter (Signed)
[name], [name], PA-C  Timothy Lasso, RN Please let her know that her C. difficile stool study was negative. Has she been taking the budesonide? How has her diarrhea been?   Her esophagram showed a smooth narrowing in the lower part of the esophagus just where it meets the stomach where the barium tablet got hung up for several minutes before continuing into the stomach. This is likely a benign narrowing/ring/stricture in that area. Please schedule for endoscopy with dilation with Dr. Carlean Purl so that he can dilate and stretch that out and resolve her swallowing issues.   Thank you,   Jess

## 2020-04-15 NOTE — Telephone Encounter (Signed)
Pt is requesting a call back from a nurse regarding her esophagus test results.

## 2020-04-16 NOTE — Telephone Encounter (Signed)
Left message on machine to call back  

## 2020-04-16 NOTE — Telephone Encounter (Signed)
The pt has been advised and EGD scheduled with Dr Carlean Purl for 9/17.  Previsit scheduled for tomorrow due to availability.  The pt verbalized understanding.  She does state she is doing very well since starting the budesonide.  No diarrhea to note.

## 2020-04-17 ENCOUNTER — Other Ambulatory Visit: Payer: Self-pay

## 2020-04-17 ENCOUNTER — Ambulatory Visit (AMBULATORY_SURGERY_CENTER): Payer: Medicare Other

## 2020-04-17 ENCOUNTER — Encounter: Payer: Self-pay | Admitting: Internal Medicine

## 2020-04-17 VITALS — Ht 61.0 in | Wt 205.0 lb

## 2020-04-17 DIAGNOSIS — R1310 Dysphagia, unspecified: Secondary | ICD-10-CM

## 2020-04-17 DIAGNOSIS — Z01818 Encounter for other preprocedural examination: Secondary | ICD-10-CM

## 2020-04-17 NOTE — Progress Notes (Signed)
Today's pre visit was completed via phone, patient's name, DOB, and address were verified;  No egg or soy allergy known to patient  No issues with past sedation with any surgeries or procedures No intubation problems in the past  No FH of Malignant Hyperthermia No diet pills per patient No home 02 use per patient  No blood thinners per patient  Pt denies issues with constipation  No A fib or A flutter  EMMI video via [facility] 19 guidelines implemented in PV today with Pt and RN  COVID screening scheduled on 05/01/2020 at 10:30 am-patient aware of appt date/time; Due to the COVID-19 pandemic we are asking patients to follow these guidelines. Please only bring one care partner. Please be aware that your care partner may wait in the car in the parking lot or if they feel like they will be too hot to wait in the car, they may wait in the lobby on the 4th floor. All care partners are required to wear a mask the entire time (we do not have any that we can provide them), they need to practice social distancing, and we will do a Covid check for all patient's and care partners when you arrive. Also we will check their temperature and your temperature. If the care partner waits in their car they need to stay in the parking lot the entire time and we will call them on their cell phone when the patient is ready for discharge so they can bring the car to the front of the building. Also all patient's will need to wear a mask into building.

## 2020-05-01 ENCOUNTER — Other Ambulatory Visit: Payer: Self-pay | Admitting: Internal Medicine

## 2020-05-01 ENCOUNTER — Ambulatory Visit (INDEPENDENT_AMBULATORY_CARE_PROVIDER_SITE_OTHER): Payer: Medicare HMO

## 2020-05-01 DIAGNOSIS — Z1159 Encounter for screening for other viral diseases: Secondary | ICD-10-CM

## 2020-05-01 LAB — SARS CORONAVIRUS 2 (TAT 6-24 HRS): SARS Coronavirus 2: NEGATIVE

## 2020-05-03 ENCOUNTER — Ambulatory Visit (AMBULATORY_SURGERY_CENTER): Payer: Medicare HMO | Admitting: Gastroenterology

## 2020-05-03 ENCOUNTER — Other Ambulatory Visit: Payer: Self-pay

## 2020-05-03 ENCOUNTER — Encounter: Payer: Self-pay | Admitting: Gastroenterology

## 2020-05-03 VITALS — BP 98/52 | HR 66 | Temp 97.5°F | Resp 13 | Ht 61.0 in | Wt 205.0 lb

## 2020-05-03 DIAGNOSIS — K449 Diaphragmatic hernia without obstruction or gangrene: Secondary | ICD-10-CM

## 2020-05-03 DIAGNOSIS — M069 Rheumatoid arthritis, unspecified: Secondary | ICD-10-CM | POA: Diagnosis not present

## 2020-05-03 DIAGNOSIS — R1310 Dysphagia, unspecified: Secondary | ICD-10-CM

## 2020-05-03 DIAGNOSIS — E119 Type 2 diabetes mellitus without complications: Secondary | ICD-10-CM | POA: Diagnosis not present

## 2020-05-03 DIAGNOSIS — K219 Gastro-esophageal reflux disease without esophagitis: Secondary | ICD-10-CM | POA: Diagnosis not present

## 2020-05-03 DIAGNOSIS — R933 Abnormal findings on diagnostic imaging of other parts of digestive tract: Secondary | ICD-10-CM

## 2020-05-03 DIAGNOSIS — I10 Essential (primary) hypertension: Secondary | ICD-10-CM | POA: Diagnosis not present

## 2020-05-03 MED ORDER — SODIUM CHLORIDE 0.9 % IV SOLN: 500.0000 mL | Freq: Once | INTRAVENOUS | Status: DC

## 2020-05-03 NOTE — Progress Notes (Signed)
Pt. Very poor historian as far as her medication review.  Frequently did not recognize med names, and was unsure, so often selected "unknown" .

## 2020-05-03 NOTE — Patient Instructions (Signed)
HANDOUTS PROVIDED ON: HIATAL HERNIA & POST DILATION DIET  The biopsies taken today have been sent for pathology.  The results can take 1-3 weeks to receive.    You may resume your previous medication schedule.  Your diet for today should be clear liquids only until 11:30 and then a soft diet the rest of the day.  Thank you for allowing [place] to care for you today!!!   YOU HAD AN ENDOSCOPIC PROCEDURE TODAY AT [facility]:   Refer to the procedure report that was given to you for any specific questions about what was found during the examination.  If the procedure report does not answer your questions, please call your gastroenterologist to clarify.  If you requested that your care partner not be given the details of your procedure findings, then the procedure report has been included in a sealed envelope for you to review at your convenience later.  YOU SHOULD EXPECT: Some feelings of bloating in the abdomen. Passage of more gas than usual.  Walking can help get rid of the air that was put into your GI tract during the procedure and reduce the bloating.   Please Note:  You might notice some irritation and congestion in your nose or some drainage.  This is from the oxygen used during your procedure.  There is no need for concern and it should clear up in a day or so.  SYMPTOMS TO REPORT IMMEDIATELY:   Following upper endoscopy (EGD)  Vomiting of blood or coffee ground material  New chest pain or pain under the shoulder blades  Painful or persistently difficult swallowing  New shortness of breath  Fever of 100F or higher  Black, tarry-looking stools  For urgent or emergent issues, a gastroenterologist can be reached at any hour by calling [phone]. Do not use MyChart messaging for urgent concerns.    DIET:  We do recommend a post dilation diet we discussed and tomorrow you may start a small meal at first, but then you may proceed to your regular diet.  Drink plenty  of fluids but you should avoid alcoholic beverages for 24 hours.  ACTIVITY:  You should plan to take it easy for the rest of today and you should NOT DRIVE or use heavy machinery until tomorrow (because of the sedation medicines used during the test).    FOLLOW UP: Our staff will call the number listed on your records 48-72 hours following your procedure to check on you and address any questions or concerns that you may have regarding the information given to you following your procedure. If we do not reach you, we will leave a message.  We will attempt to reach you two times.  During this call, we will ask if you have developed any symptoms of COVID 19. If you develop any symptoms (ie: fever, flu-like symptoms, shortness of breath, cough etc.) before then, please call [phone].  If you test positive for Covid 19 in the 2 weeks post procedure, please call and report this information to [place].    If any biopsies were taken you will be contacted by phone or by letter within the next 1-3 weeks.  Please call us at [phone] if you have not heard about the biopsies in 3 weeks.    SIGNATURES/CONFIDENTIALITY: You and/or your care partner have signed paperwork which will be entered into your electronic medical record.  These signatures attest to the fact that that the information above on your After Visit  Summary has been reviewed and is understood.  Full responsibility of the confidentiality of this discharge information lies with you and/or your care-partner. 

## 2020-05-03 NOTE — Progress Notes (Signed)
Dr Fuller Plan aware of lido and robinul

## 2020-05-03 NOTE — Op Note (Signed)
[facility] Patient Name: Sierrah Luevano Procedure Date: 05/03/2020 9:03 AM MRN: [id number] Endoscopist: Ladene Artist , MD Age: 69 Referring MD:  Date of Birth: [birth year] Gender: Female Account #: [id number] Procedure:                Upper GI endoscopy Indications:              Dysphagia, Abnormal esophagram Medicines:                Monitored Anesthesia Care Procedure:                Pre-Anesthesia Assessment:                           - Prior to the procedure, a History and Physical                            was performed, and patient medications and                            allergies were reviewed. The patient's tolerance of                            previous anesthesia was also reviewed. The risks                            and benefits of the procedure and the sedation                            options and risks were discussed with the patient.                            All questions were answered, and informed consent                            was obtained. Prior Anticoagulants: The patient has                            taken no previous anticoagulant or antiplatelet                            agents. ASA Grade Assessment: III - A patient with                            severe systemic disease. After reviewing the risks                            and benefits, the patient was deemed in                            satisfactory condition to undergo the procedure.                           After obtaining informed consent, the endoscope was  passed under direct vision. Throughout the                            procedure, the patient's blood pressure, pulse, and                            oxygen saturations were monitored continuously. The                            Endoscope was introduced through the mouth, and                            advanced to the second part of duodenum. The upper                            GI endoscopy was  accomplished without difficulty.                            The patient tolerated the procedure well. Scope In: Scope Out: Findings:                 No endoscopic abnormality was evident in the                            esophagus to explain the patient's complaint of                            dysphagia. It was decided, however, to proceed with                            dilation of the entire esophagus. A guidewire was                            placed and the scope was withdrawn. Dilation was                            performed with Savary dilators with mild resistance                            at 15 mm, 16 mm.                           The exam of the esophagus was otherwise normal.                           A small hiatal hernia was present.                           Patchy mildly erythematous mucosa without bleeding                            was found in the gastric body, in the gastric  antrum and in the prepyloric region of the stomach.                            Biopsies were taken with a cold forceps for                            histology.                           The exam of the stomach was otherwise normal.                           The duodenal bulb and second portion of the                            duodenum were normal. Complications:            No immediate complications. Estimated Blood Loss:     Estimated blood loss was minimal. Impression:               - No endoscopic esophageal abnormality to explain                            patient's dysphagia. Esophagus dilated.                           - Small hiatal hernia.                           - Erythematous mucosa in the gastric body, antrum                            and prepyloric region of the stomach. Biopsied.                           - Normal duodenal bulb and second portion of the                            duodenum. Recommendation:           - Patient has a contact number  available for                            emergencies. The signs and symptoms of potential                            delayed complications were discussed with the                            patient. Return to normal activities tomorrow.                            Written discharge instructions were provided to the                            patient.                           -  Clear liquid diet for 2 hours, then advance as                            tolerated to soft diet today.                           - Antireflux measures.                           - Continue present medications.                           - Await pathology results.                           - Return to GI office in 6 weeks with Dr. Carlean Purl                            or APP. Ladene Artist, MD 05/03/2020 9:21:09 AM This report has been signed electronically.

## 2020-05-03 NOTE — Progress Notes (Signed)
Report to PACU, RN, vss, BBS= Clear.  

## 2020-05-03 NOTE — Progress Notes (Signed)
Pt. Reports her allergies are bothering her, has infrequent dry cough. Pt. Every day smoker 1 ppd.

## 2020-05-03 NOTE — Progress Notes (Signed)
Called to room to assist during endoscopic procedure.  Patient ID and intended procedure confirmed with present staff. Received instructions for my participation in the procedure from the performing physician.  

## 2020-05-07 ENCOUNTER — Other Ambulatory Visit: Payer: Self-pay | Admitting: Family Medicine

## 2020-05-07 ENCOUNTER — Telehealth: Payer: Self-pay | Admitting: *Deleted

## 2020-05-07 ENCOUNTER — Telehealth: Payer: Self-pay

## 2020-05-07 NOTE — Telephone Encounter (Signed)
1st follow up call made.  NALM 

## 2020-05-07 NOTE — Telephone Encounter (Signed)
°  Follow up Call-  Call back number 05/03/2020  Post procedure Call Back phone  # [phone]  Permission to leave phone message Yes  Some recent data might be hidden    LMOM to call back with any questions or concerns.  Also, call back if patient has developed fever, respiratory issues or been dx with COVID or had any family members or close contacts diagnosed since her procedure.

## 2020-05-13 ENCOUNTER — Encounter: Payer: Self-pay | Admitting: Gastroenterology

## 2020-05-13 ENCOUNTER — Ambulatory Visit: Payer: Medicare Other | Admitting: Family Medicine

## 2020-05-13 ENCOUNTER — Other Ambulatory Visit: Payer: Medicare Other

## 2020-05-13 ENCOUNTER — Other Ambulatory Visit: Payer: Self-pay | Admitting: Family Medicine

## 2020-05-15 ENCOUNTER — Ambulatory Visit (INDEPENDENT_AMBULATORY_CARE_PROVIDER_SITE_OTHER): Payer: Medicare HMO | Admitting: Licensed Clinical Social Worker

## 2020-05-15 DIAGNOSIS — I10 Essential (primary) hypertension: Secondary | ICD-10-CM

## 2020-05-15 DIAGNOSIS — E1159 Type 2 diabetes mellitus with other circulatory complications: Secondary | ICD-10-CM | POA: Diagnosis not present

## 2020-05-15 DIAGNOSIS — E785 Hyperlipidemia, unspecified: Secondary | ICD-10-CM | POA: Diagnosis not present

## 2020-05-15 DIAGNOSIS — I152 Hypertension secondary to endocrine disorders: Secondary | ICD-10-CM

## 2020-05-15 DIAGNOSIS — E119 Type 2 diabetes mellitus without complications: Secondary | ICD-10-CM

## 2020-05-15 DIAGNOSIS — F329 Major depressive disorder, single episode, unspecified: Secondary | ICD-10-CM | POA: Diagnosis not present

## 2020-05-15 DIAGNOSIS — E1169 Type 2 diabetes mellitus with other specified complication: Secondary | ICD-10-CM

## 2020-05-15 DIAGNOSIS — F32A Depression, unspecified: Secondary | ICD-10-CM

## 2020-05-15 DIAGNOSIS — F410 Panic disorder [episodic paroxysmal anxiety] without agoraphobia: Secondary | ICD-10-CM

## 2020-05-15 NOTE — Patient Instructions (Addendum)
Licensed Clinical Social Worker Visit Information  Goals we discussed today:    .  Client will talk with [name] in next 30 days to discuss health needs of client and managing her health needs (pt-stated)        CARE PLAN ENTRY   Current Barriers:   Pain needs (feet,back)  Mobility issues of client with chronic diagnoses of Dysphagia, GAD, HLD, DM Type 2, Depressive Disorder, GERD, Obesity , Edema, HTN  Clinical Social Work Clinical Goal(s):   [name] to call client in next 30 days to talk about health needs of client and managing health needs of client  Interventions:  Talked with [name], significant other to client , about pain issues of client  Talked with Milbert Coulter about mobility issues of client (uses a walker as needed)  Encouraged client or Milbert Coulter to call RNCM as needed for nursing support  Talked with Milbert Coulter about upcoming client medical appointments  Talked with Milbert Coulter about transport needs of client  Talked with Milbert Coulter about support for client from Gastroenterologist   Talked with Milbert Coulter about relaxation techniques of client (watches TV, take a nap, visiting with son who lives nearby, sits on porch)  Talked with Milbert Coulter about client's ,,management of arthritis  Talked with Milbert Coulter about grief issues faced by client  Talked with Milbert Coulter about memory challenges of client (client said she has challenges with recent memory but can recall memories from the past)  Talked with Milbert Coulter about medication administration for client  Talked with Milbert Coulter about client expense of obtaining Jardiance monthly   Patient Self Care Activities:   Completes ADLs as needed Attends medical appointments   Patient Self Care Deficits:   Pain issues  Difficulty in mobility  Initial goal documentation     Follow Up Plan: [name] to call client [name] in next 4 weeks to talk with client/[name] about health needs of client and her management of health needs faced  Materials Provided:  No  The patient/[name] Lissa Merlin, significant other to patient, verbalized understanding of instructions provided today and declined a print copy of patient instruction materials.   Norva Riffle.Drue Camera MSW, [name] Licensed Clinical Social Worker [facility] [phone]

## 2020-05-15 NOTE — Chronic Care Management (AMB) (Signed)
Chronic Care Management    Clinical Social Work Follow Up Note  05/15/2020 Name: Donna Rogers MRN: [id number] DOB: 31-Dec-1950  Donna Rogers is a 69 y.o. year old female who is a primary care patient of [name], DO. The CCM team was consulted for assistance with [employer].   Review of patient status, including review of consultants reports, other relevant assessments, and collaboration with appropriate care team members and the patient's provider was performed as part of comprehensive patient evaluation and provision of chronic care management services.    SDOH (Social Determinants of Health) assessments performed: No;risk for depression; risk for tobacco use; risk for financial strain; risk for stress; risk for physical inactivity    Chronic Care Management from 03/06/2020 in [place]  PHQ-9 Total Score 6       GAD 7 : Generalized Anxiety Score 03/06/2020 02/12/2020 10/11/2019 09/01/2019  Nervous, Anxious, on Edge 0 1 2 3   Control/stop worrying 1 2 2 2   Worry too much - different things 0 2 2 2   Trouble relaxing 0 2 2 1   Restless 0 1 0 0  Easily annoyed or irritable 0 3 1 2   Afraid - awful might happen 0 0 1 1  Total GAD 7 Score 1 11 10 11   Anxiety Difficulty Somewhat difficult Very difficult Somewhat difficult Somewhat difficult    Outpatient Encounter Medications as of 05/15/2020  Medication Sig  . amLODipine (NORVASC) 5 MG tablet TAKE 1 TABLET BY MOUTH DAILY.  Marland Kitchen aspirin 81 MG tablet Take 81 mg by mouth daily.  Marland Kitchen atenolol (TENORMIN) 25 MG tablet TAKE 1 TABLET BY MOUTH DAILY.  Marland Kitchen azelastine (OPTIVAR) 0.05 % ophthalmic solution in the morning and at bedtime.  . budesonide (ENTOCORT EC) 3 MG 24 hr capsule Take 3 capsules (9 mg total) by mouth daily.  Marland Kitchen buPROPion (WELLBUTRIN XL) 150 MG 24 hr tablet TAKE 1 TABLET EVERY DAY  . busPIRone (BUSPAR) 10 MG tablet TAKE  (1)  TABLET TWICE A DAY.  . cholecalciferol (VITAMIN D) 1000 UNITS  tablet Take 1,000 Units by mouth daily.  . citalopram (CELEXA) 40 MG tablet TAKE 1 TABLET EVERY DAY (Needs to be seen)  . Cyanocobalamin (VITAMIN B 12 PO) Take by mouth.  . diclofenac (VOLTAREN) 75 MG EC tablet Take 1 tablet (75 mg total) by mouth 2 (two) times daily as needed for moderate pain (use sparingly).  Marland Kitchen diphenoxylate-atropine (LOMOTIL) 2.5-0.025 MG tablet Take 1 tablet by mouth every 8 (eight) hours as needed for diarrhea or loose stools. TAKE 1 OR 2 TABLETS FOUR TIMES DAILY AS NEEDED  . empagliflozin (JARDIANCE) 25 MG TABS tablet Take 1 tablet (25 mg total) by mouth daily before breakfast.  . gabapentin (NEURONTIN) 600 MG tablet Take 2 tablets (1,200 mg total) by mouth 3 (three) times daily.  Marland Kitchen glipiZIDE (GLUCOTROL) 10 MG tablet TAKE (1) TABLET TWICE A DAY BEFORE MEALS.  . HYDROcodone-acetaminophen (NORCO/VICODIN) 5-325 MG per tablet Take 1 tablet by mouth every 6 (six) hours as needed. (Patient not taking: Reported on 05/03/2020)  . losartan (COZAAR) 100 MG tablet Take 1 tablet (100 mg total) by mouth daily.  Marland Kitchen omeprazole (PRILOSEC) 20 MG capsule TAKE (1) CAPSULE DAILY  . Pyridoxine HCl (VITAMIN B-6 PO) Take by mouth.  . RESTASIS 0.05 % ophthalmic emulsion in the morning and at bedtime. (Patient not taking: Reported on 05/03/2020)  . simvastatin (ZOCOR) 40 MG tablet TAKE 1 TABLET DAILY  . SULFASALAZINE PO Take by  mouth.  . vitamin C (ASCORBIC ACID) 500 MG tablet Take 500 mg by mouth daily. (Patient not taking: Reported on 05/03/2020)  . zolpidem (AMBIEN) 5 MG tablet Take 1 tablet (5 mg total) by mouth at bedtime as needed for sleep (use sparingly). (Patient not taking: Reported on 05/03/2020)   No facility-administered encounter medications on file as of 05/15/2020.     Goals    .  Client will talk with [name] in next 30 days to discuss health needs of client and managing her health needs (pt-stated)      CARE PLAN ENTRY   Current Barriers:  . Pain needs (feet,back) . Mobility  issues of client with chronic diagnoses of Dysphagia, GAD, HLD, DM Type 2, Depressive Disorder, GERD, Obesity , Edema, HTN  Clinical Social Work Clinical Goal(s):  Marland Kitchen [name] to call client in next 30 days to talk about health needs of client and managing health needs of client  Interventions: . Talked with [name], significant other to client , about pain issues of client . Talked with Milbert Coulter about mobility issues of client (uses a walker as needed) . Encouraged client or Milbert Coulter to call RNCM as needed for nursing support . Talked with Milbert Coulter about upcoming client medical appointments . Talked with Milbert Coulter about transport needs of client . Talked with Milbert Coulter about support for client from Copywriter, advertising  . Talked with Milbert Coulter about relaxation techniques of client (watches TV, take a nap, visiting with son who lives nearby, sits on porch) . Talked with Milbert Coulter about client's ,,management of arthritis . Talked with Milbert Coulter about grief issues faced by client . Talked with Milbert Coulter about memory challenges of client (client said she has challenges with recent memory but can recall memories from the past) . Talked with Milbert Coulter about medication administration for client . Talked with Milbert Coulter about client expense of obtaining Jardiance monthly   Patient Self Care Activities:   Completes ADLs as needed Attends medical appointments   Patient Self Care Deficits:  . Pain issues . Difficulty in mobility  Initial goal documentation     Follow Up Plan: [name] to call client [name] in next 4 weeks to talk with client/[name] about health needs of client and her management of health needs faced  Norva Riffle.Theora Vankirk MSW, [name] Licensed Clinical Social Worker [facility] [phone]

## 2020-05-20 ENCOUNTER — Other Ambulatory Visit: Payer: Medicare HMO

## 2020-05-22 ENCOUNTER — Other Ambulatory Visit: Payer: Self-pay | Admitting: Family Medicine

## 2020-05-22 DIAGNOSIS — F32A Depression, unspecified: Secondary | ICD-10-CM

## 2020-06-04 ENCOUNTER — Other Ambulatory Visit: Payer: Self-pay | Admitting: Family Medicine

## 2020-06-04 DIAGNOSIS — K2100 Gastro-esophageal reflux disease with esophagitis, without bleeding: Secondary | ICD-10-CM

## 2020-06-18 ENCOUNTER — Encounter: Payer: Self-pay | Admitting: Internal Medicine

## 2020-06-18 ENCOUNTER — Other Ambulatory Visit (HOSPITAL_COMMUNITY): Payer: Self-pay

## 2020-06-18 ENCOUNTER — Ambulatory Visit: Payer: Medicare HMO | Admitting: Internal Medicine

## 2020-06-18 VITALS — BP 144/72 | HR 68 | Ht 60.0 in | Wt 210.0 lb

## 2020-06-18 DIAGNOSIS — K219 Gastro-esophageal reflux disease without esophagitis: Secondary | ICD-10-CM | POA: Diagnosis not present

## 2020-06-18 DIAGNOSIS — R1314 Dysphagia, pharyngoesophageal phase: Secondary | ICD-10-CM

## 2020-06-18 DIAGNOSIS — K529 Noninfective gastroenteritis and colitis, unspecified: Secondary | ICD-10-CM | POA: Diagnosis not present

## 2020-06-18 DIAGNOSIS — R1310 Dysphagia, unspecified: Secondary | ICD-10-CM

## 2020-06-18 DIAGNOSIS — K52832 Lymphocytic colitis: Secondary | ICD-10-CM

## 2020-06-18 MED ORDER — PANTOPRAZOLE SODIUM 40 MG PO TBEC: 40.0000 mg | DELAYED_RELEASE_TABLET | Freq: Every day | ORAL | 3 refills | Status: DC

## 2020-06-18 MED ORDER — CHOLESTYRAMINE LIGHT 4 G PO PACK: 4.0000 g | PACK | Freq: Every day | ORAL | 1 refills | Status: DC

## 2020-06-18 NOTE — Patient Instructions (Addendum)
We have sent the following medications to your pharmacy for you to pick up at your convenience: Pantoprazole and cholestyramine  You have been scheduled for a modified barium swallow on 06/24/2020 at 11:30AM. Please arrive 15 minutes prior to your test for registration. You will go to [facility] (1st Floor) for your appointment. Should you need to cancel or reschedule your appointment, please contact [phone] [place] [place]) or [phone] [place]). _____________________________________________________________________ A Modified Barium Swallow Study, or MBS, is a special x-ray that is taken to check swallowing skills. It is carried out by a Stage manager and a Psychologist, clinical (SLP). During this test, yourmouth, throat, and esophagus, a muscular tube which connects your mouth to your stomach, is checked. The test will help you, your doctor, and the SLP plan what types of foods and liquids are easier for you to swallow. The SLP will also identify positions and ways to help you swallow more easily and safely. What will happen during an MBS? You will be taken to an x-ray room and seated comfortably. You will be asked to swallow small amounts of food and liquid mixed with barium. Barium is a liquid or paste that allows images of your mouth, throat and esophagus to be seen on x-ray. The x-ray captures moving images of the food you are swallowing as it travels from your mouth through your throat and into your esophagus. This test helps identify whether food or liquid is entering your lungs (aspiration). The test also shows which part of your mouth or throat lacks strength or coordination to move the food or liquid in the right direction. This test typically takes 30 minutes to 1 hour to complete. _______________________________________________________________________    Please follow up with Dr Carlean Purl in December.   I appreciate the opportunity to care for you. Silvano Rusk,  MD, [facility]

## 2020-06-18 NOTE — Progress Notes (Signed)
Donna Rogers 69 y.o. 1951/05/20 [id number]  Assessment & Plan:   Encounter Diagnoses  Name Primary?  Marland Kitchen Dysphagia, pharyngoesophageal phase Yes  . Chronic diarrhea   . Lymphocytic colitis   . Gastroesophageal reflux disease without esophagitis     Orders Placed This Encounter  Procedures  . SLP modified barium swallow    Meds ordered this encounter  Medications  . pantoprazole (PROTONIX) 40 MG tablet    Sig: Take 1 tablet (40 mg total) by mouth daily before breakfast.    Dispense:  90 tablet    Refill:  3  . cholestyramine light (PREVALITE) 4 g packet    Sig: Take 1 packet (4 g total) by mouth at bedtime.    Dispense:  90 packet    Refill:  1   Medications Discontinued During This Encounter  Medication Reason  . omeprazole (PRILOSEC) 20 MG capsule   . budesonide (ENTOCORT EC) 3 MG 24 hr capsule    I will evaluate this dysphagia with a modified barium swallow.  Sounds pharyngoesophageal perhaps.  Wonder about some esophageal dysmotility as well.  Stricture was not identified on her EGD so I suspect she had spasm in the distal esophagus on prior barium swallow.  She is not responding to treatment for GERD with her omeprazole so we will discontinue that and put her on pantoprazole 40 mg daily.  Discontinue budesonide and try cholestyramine powder for diarrhea.  Return to see me in December.   I appreciate the opportunity to care for this patient. CC: [name], DO  Subjective:   Chief Complaint:  HPI 69 year old white woman with rheumatoid arthritis, diabetes and neuropathy, and prior lymphocytic colitis returns after upper GI endoscopy with dilation to 16 mm Savary performed for dysphagia, in September by Dr . Fuller Plan.  Prior to that she had undergone a barium swallow after seeing one of our advanced practitioners, and there was a distal stenosis found.  She reports not much change with the esophageal dilation.  She is still having some heartburn  symptomatology.  Today she tells me she "wants to swallow but cannot".  This occurs just when sitting not necessarily eating.  She is not having any food dysphagia.  Maybe sometimes with pills.  She was restarted on budesonide for her lymphocytic colitis but says it is not making a big difference.  Other complaints are some mild epigastric discomfort at times. Allergies  Allergen Reactions  . Ciprofloxacin Hcl Rash   Current Meds  Medication Sig  . amLODipine (NORVASC) 5 MG tablet TAKE 1 TABLET BY MOUTH DAILY.  Marland Kitchen aspirin 81 MG tablet Take 81 mg by mouth daily.  Marland Kitchen atenolol (TENORMIN) 25 MG tablet TAKE 1 TABLET BY MOUTH DAILY.  Marland Kitchen azelastine (OPTIVAR) 0.05 % ophthalmic solution in the morning and at bedtime.  . budesonide (ENTOCORT EC) 3 MG 24 hr capsule Take 3 capsules (9 mg total) by mouth daily.  Marland Kitchen buPROPion (WELLBUTRIN XL) 150 MG 24 hr tablet Take 1 tablet (150 mg total) by mouth daily. (Needs to be seen before next refill)  . busPIRone (BUSPAR) 10 MG tablet TAKE  (1)  TABLET TWICE A DAY.  . cholecalciferol (VITAMIN D) 1000 UNITS tablet Take 1,000 Units by mouth daily.  . citalopram (CELEXA) 40 MG tablet Take 1 tablet (40 mg total) by mouth daily. (Needs to be seen before next refill)  . Cyanocobalamin (VITAMIN B 12 PO) Take by mouth.  . diclofenac (VOLTAREN) 75 MG EC tablet TAKE 1  TABLET 2 TIMES A DAY AS NEEDED FOR MODERATE PAIN (use sparingly).  Marland Kitchen diphenoxylate-atropine (LOMOTIL) 2.5-0.025 MG tablet Take 1 tablet by mouth every 8 (eight) hours as needed for diarrhea or loose stools. TAKE 1 OR 2 TABLETS FOUR TIMES DAILY AS NEEDED  . empagliflozin (JARDIANCE) 25 MG TABS tablet Take 1 tablet (25 mg total) by mouth daily before breakfast.  . gabapentin (NEURONTIN) 600 MG tablet Take 2 tablets (1,200 mg total) by mouth 3 (three) times daily.  Marland Kitchen glipiZIDE (GLUCOTROL) 10 MG tablet TAKE (1) TABLET TWICE A DAY BEFORE MEALS.  . HYDROcodone-acetaminophen (NORCO/VICODIN) 5-325 MG per tablet Take 1  tablet by mouth every 6 (six) hours as needed.  Marland Kitchen losartan (COZAAR) 100 MG tablet Take 1 tablet (100 mg total) by mouth daily.  Marland Kitchen omeprazole (PRILOSEC) 20 MG capsule TAKE (1) CAPSULE DAILY  . Pyridoxine HCl (VITAMIN B-6 PO) Take by mouth.  . RESTASIS 0.05 % ophthalmic emulsion in the morning and at bedtime.   . simvastatin (ZOCOR) 40 MG tablet TAKE 1 TABLET DAILY  . SULFASALAZINE PO Take by mouth.  . vitamin C (ASCORBIC ACID) 500 MG tablet Take 500 mg by mouth daily.   Marland Kitchen zolpidem (AMBIEN) 5 MG tablet Take 1 tablet (5 mg total) by mouth at bedtime as needed for sleep (use sparingly).   Past Medical History:  Diagnosis Date  . Allergy    seasonal allergies  . Anemia    hx of  . Arthritis    rheumatoid  . Depression    on meds  . Diabetes ([place])   . Diabetic peripheral neuropathy ([place])   . GERD (gastroesophageal reflux disease)    on meds  . Headache   . Hyperlipidemia    on meds  . Hyperplastic rectal polyp 10/21/2015  . Hypertension    pt. denies  . Lymphocytic colitis   . Neuropathy   . Rheumatoid arthritis ([place])   . Shortness of breath dyspnea    with exertion   Past Surgical History:  Procedure Laterality Date  . BREAST LUMPECTOMY Right 2018  . BREAST LUMPECTOMY WITH RADIOACTIVE SEED LOCALIZATION Left 03/03/2016   Procedure: BREAST LUMPECTOMY WITH RADIOACTIVE SEED LOCALIZATION;  Surgeon: Coralie Keens, MD;  Location: [facility];  Service: General;  Laterality: Left;  . COLONOSCOPY  08/2002   RMR: internal hemorrhoids  . COLONOSCOPY N/A 10/21/2015   Procedure: COLONOSCOPY;  Surgeon: Daneil Dolin, MD;  [facility];  Service: Endoscopy;  Laterality: N/A;  250 - moved to 2:35 - office to notify pt  . CYST REMOVAL TRUNK    . ESOPHAGOGASTRODUODENOSCOPY  08/2002   RMR: nonerosive reflux esophagitis, hiatal hernia  . TONSILLECTOMY    . WISDOM TOOTH EXTRACTION     Social History   Social History Narrative   The patient is divorced she has 1 sonWho is married that  she has a grandchild.   She does not use alcohol or drugs she drinks 3-4 caffeinated beverages daily   She is a smoker   family history includes Alzheimer's disease in her mother; Arthritis in her sister; Diabetes in her brother, father, and sister; Heart disease in her father; Hypertension in her brother; Lung cancer (age of onset: 76) in her mother.   Review of Systems  As per HPI Objective:   Physical Exam BP (!) 144/72 (BP Location: Left Arm, Patient Position: Sitting, Cuff Size: Normal)   Pulse 68   Ht 5' (1.524 m) Comment: height measured without shoes  Wt 210  lb (95.3 kg)   BMI 41.01 kg/m   Elderly obese white woman no acute distress but appearing chronically ill

## 2020-06-19 ENCOUNTER — Other Ambulatory Visit: Payer: Self-pay | Admitting: Family Medicine

## 2020-06-19 DIAGNOSIS — F329 Major depressive disorder, single episode, unspecified: Secondary | ICD-10-CM

## 2020-06-19 DIAGNOSIS — F32A Depression, unspecified: Secondary | ICD-10-CM

## 2020-06-19 NOTE — Telephone Encounter (Signed)
[place]. NTBS 30 days given 05/22/20

## 2020-06-19 NOTE — Telephone Encounter (Signed)
Attempted to contact patient - NA °

## 2020-06-20 ENCOUNTER — Ambulatory Visit: Payer: Medicare HMO | Admitting: Licensed Clinical Social Worker

## 2020-06-20 DIAGNOSIS — E1169 Type 2 diabetes mellitus with other specified complication: Secondary | ICD-10-CM

## 2020-06-20 DIAGNOSIS — F32A Depression, unspecified: Secondary | ICD-10-CM

## 2020-06-20 DIAGNOSIS — F410 Panic disorder [episodic paroxysmal anxiety] without agoraphobia: Secondary | ICD-10-CM

## 2020-06-20 DIAGNOSIS — I152 Hypertension secondary to endocrine disorders: Secondary | ICD-10-CM

## 2020-06-20 DIAGNOSIS — E119 Type 2 diabetes mellitus without complications: Secondary | ICD-10-CM

## 2020-06-20 DIAGNOSIS — F329 Major depressive disorder, single episode, unspecified: Secondary | ICD-10-CM

## 2020-06-20 DIAGNOSIS — R1310 Dysphagia, unspecified: Secondary | ICD-10-CM

## 2020-06-20 NOTE — Patient Instructions (Addendum)
Licensed Clinical Social Worker Visit Information  Goals we discussed today:   .  Client will talk with [name] in next 30 days to discuss health needs of client and managing her health needs (pt-stated)        CARE PLAN ENTRY   Current Barriers:   Pain needs (feet,back)  Mobility issues of client with chronic diagnoses of Dysphagia, GAD, HLD, DM Type 2, Depressive Disorder, GERD, Obesity , Edema, HTN  Clinical Social Work Clinical Goal(s):   [name] to call client in next 30 days to talk about health needs of client and managing health needs of client  Interventions:  Talked with [name], significant other to client, about pain issues of client  Talked with Milbert Coulter about mobility issues of client (uses a walker as needed)  Encouraged client /[name] to call RNCM as needed for nursing support for client  Talked with Milbert Coulter about upcoming client medical appointments  Talked with Milbert Coulter about transport needs of client  Talked with client/[name] about support from Gastroenterologist   Talked with Milbert Coulter about relaxation techniques of client (watches TV, take a nap, visiting with son who lives nearby, sits on porch)  Talked with Milbert Coulter about client's difficulty cooking  Talked with Milbert Coulter  about client management of arthritis challenges  Talked with Milbert Coulter about memory challenges of client   Talked with Milbert Coulter about sleeping challenges of client  Talked with Milbert Coulter about vision of client  Patient Self Care Activities:   Completes ADLs as needed Attends medical appointments   Patient Self Care Deficits:   Pain issues  Difficulty in mobility  Initial goal documentation     Follow Up Plan: [name] to call client/[name] next 4 weeks to talk with client/Leonabout health needs of client and her management of health needs faced  Materials Provided: No  The patient/[name] Lissa Merlin, significant other,  verbalized understanding of instructions provided today and declined a print  copy of patient instruction materials.   Norva Riffle.Jaelyn Cloninger MSW, [name] Licensed Clinical Social Worker [facility] [phone]

## 2020-06-20 NOTE — Chronic Care Management (AMB) (Signed)
Chronic Care Management    Clinical Social Work Follow Up Note  06/20/2020 Name: KOLBEE STALLMAN MRN: [id number] DOB: [birth year]  ISOLA MEHLMAN is a 69 y.o. year old female who is a primary care patient of [name], DO. The CCM team was consulted for assistance with [employer] .   Review of patient status, including review of consultants reports, other relevant assessments, and collaboration with appropriate care team members and the patient's provider was performed as part of comprehensive patient evaluation and provision of chronic care management services.    SDOH (Social Determinants of Health) assessments performed: No; risk for depression; risk for financial strain; risk for tobacco use; risk for stress; risk for physical inactivity;      Chronic Care Management from 03/06/2020 in [place]  PHQ-9 Total Score 6      GAD 7 : Generalized Anxiety Score 03/06/2020 02/12/2020 10/11/2019 09/01/2019  Nervous, Anxious, on Edge 0 1 2 3   Control/stop worrying 1 2 2 2   Worry too much - different things 0 2 2 2   Trouble relaxing 0 2 2 1   Restless 0 1 0 0  Easily annoyed or irritable 0 3 1 2   Afraid - awful might happen 0 0 1 1  Total GAD 7 Score 1 11 10 11   Anxiety Difficulty Somewhat difficult Very difficult Somewhat difficult Somewhat difficult    Outpatient Encounter Medications as of 06/20/2020  Medication Sig  . amLODipine (NORVASC) 5 MG tablet TAKE 1 TABLET BY MOUTH DAILY.  Marland Kitchen aspirin 81 MG tablet Take 81 mg by mouth daily.  Marland Kitchen atenolol (TENORMIN) 25 MG tablet TAKE 1 TABLET BY MOUTH DAILY.  Marland Kitchen azelastine (OPTIVAR) 0.05 % ophthalmic solution in the morning and at bedtime.  Marland Kitchen buPROPion (WELLBUTRIN XL) 150 MG 24 hr tablet Take 1 tablet (150 mg total) by mouth daily. (Needs to be seen before next refill)  . busPIRone (BUSPAR) 10 MG tablet TAKE  (1)  TABLET TWICE A DAY.  . cholecalciferol (VITAMIN D) 1000 UNITS tablet Take 1,000 Units by mouth  daily.  . cholestyramine light (PREVALITE) 4 g packet Take 1 packet (4 g total) by mouth at bedtime.  . citalopram (CELEXA) 40 MG tablet Take 1 tablet (40 mg total) by mouth daily. (Needs to be seen before next refill)  . Cyanocobalamin (VITAMIN B 12 PO) Take by mouth.  . diclofenac (VOLTAREN) 75 MG EC tablet TAKE 1 TABLET 2 TIMES A DAY AS NEEDED FOR MODERATE PAIN (use sparingly).  Marland Kitchen diphenoxylate-atropine (LOMOTIL) 2.5-0.025 MG tablet Take 1 tablet by mouth every 8 (eight) hours as needed for diarrhea or loose stools. TAKE 1 OR 2 TABLETS FOUR TIMES DAILY AS NEEDED  . empagliflozin (JARDIANCE) 25 MG TABS tablet Take 1 tablet (25 mg total) by mouth daily before breakfast.  . gabapentin (NEURONTIN) 600 MG tablet Take 2 tablets (1,200 mg total) by mouth 3 (three) times daily.  Marland Kitchen glipiZIDE (GLUCOTROL) 10 MG tablet TAKE (1) TABLET TWICE A DAY BEFORE MEALS.  . HYDROcodone-acetaminophen (NORCO/VICODIN) 5-325 MG per tablet Take 1 tablet by mouth every 6 (six) hours as needed.  Marland Kitchen losartan (COZAAR) 100 MG tablet Take 1 tablet (100 mg total) by mouth daily.  . pantoprazole (PROTONIX) 40 MG tablet Take 1 tablet (40 mg total) by mouth daily before breakfast.  . Pyridoxine HCl (VITAMIN B-6 PO) Take by mouth.  . RESTASIS 0.05 % ophthalmic emulsion in the morning and at bedtime.   . simvastatin (ZOCOR) 40 MG  tablet TAKE 1 TABLET DAILY  . SULFASALAZINE PO Take by mouth.  . vitamin C (ASCORBIC ACID) 500 MG tablet Take 500 mg by mouth daily.   Marland Kitchen zolpidem (AMBIEN) 5 MG tablet Take 1 tablet (5 mg total) by mouth at bedtime as needed for sleep (use sparingly).   No facility-administered encounter medications on file as of 06/20/2020.    Goals    .  Client will talk with [name] in next 30 days to discuss health needs of client and managing her health needs (pt-stated)      CARE PLAN ENTRY   Current Barriers:  . Pain needs (feet,back) . Mobility issues of client with chronic diagnoses of Dysphagia, GAD, HLD, DM  Type 2, Depressive Disorder, GERD, Obesity , Edema, HTN  Clinical Social Work Clinical Goal(s):  Marland Kitchen [name] to call client in next 30 days to talk about health needs of client and managing health needs of client  Interventions: . Talked with [name], significant other to client, about pain issues of client . Talked with Milbert Coulter about mobility issues of client (uses a walker as needed) . Encouraged client Milbert Coulter to call RNCM as needed for nursing support for client . Talked with Milbert Coulter about upcoming client medical appointments . Talked with Milbert Coulter about transport needs of client . Talked with client/[name] about support from Gastroenterologist  . Talked with Milbert Coulter about relaxation techniques of client (watches TV, take a nap, visiting with son who lives nearby, sits on porch) . Talked with Milbert Coulter about client's difficulty cooking . Talked with Milbert Coulter  about client management of arthritis challenges . Talked with Milbert Coulter about memory challenges of client   Talked with Milbert Coulter about sleeping challenges of client  Talked with Milbert Coulter about vision of client  Patient Self Care Activities:   Completes ADLs as needed Attends medical appointments   Patient Self Care Deficits:  . Pain issues . Difficulty in mobility  Initial goal documentation     Follow Up Plan: [name] to call client [name] in next 4 weeks to talk with client/[name] about health needs of client and her management of health needs faced  Norva Riffle.Yuvia Plant MSW, [name] Licensed Clinical Social Worker [facility] [phone]

## 2020-06-24 ENCOUNTER — Ambulatory Visit (HOSPITAL_COMMUNITY): Payer: Medicare HMO

## 2020-06-24 ENCOUNTER — Ambulatory Visit (HOSPITAL_COMMUNITY)
Admission: RE | Admit: 2020-06-24 | Discharge: 2020-06-24 | Disposition: A | Discharge status: Home / Self Care | Payer: Medicare HMO | Source: Ambulatory Visit | Attending: Internal Medicine | Admitting: Internal Medicine

## 2020-06-24 DIAGNOSIS — R1314 Dysphagia, pharyngoesophageal phase: Secondary | ICD-10-CM

## 2020-06-27 ENCOUNTER — Other Ambulatory Visit (HOSPITAL_COMMUNITY): Payer: Self-pay

## 2020-06-27 DIAGNOSIS — R1310 Dysphagia, unspecified: Secondary | ICD-10-CM

## 2020-06-28 ENCOUNTER — Other Ambulatory Visit: Payer: Self-pay | Admitting: Family Medicine

## 2020-06-28 ENCOUNTER — Telehealth: Payer: Self-pay | Admitting: Internal Medicine

## 2020-06-28 DIAGNOSIS — F32A Depression, unspecified: Secondary | ICD-10-CM

## 2020-06-28 MED ORDER — DIPHENOXYLATE-ATROPINE 2.5-0.025 MG PO TABS
ORAL_TABLET | ORAL | 1 refills | Status: DC
Start: 2020-06-28 — End: 2022-08-05

## 2020-06-28 NOTE — Addendum Note (Signed)
Addended by: Gatha Mayer on: 06/28/2020 01:18 PM   Modules accepted: Orders

## 2020-06-28 NOTE — Telephone Encounter (Signed)
Called patient and let her know Dr. Carlean Purl would like her to stop the Prevalite and start back on the Lomotil. I gave instructions for taking.

## 2020-06-28 NOTE — Telephone Encounter (Signed)
Called patient back, and she states she has been taking the Prevalite since 06/18/20 and her diarrhea has only gotten worse. She held last nights dose, but she was still up till 3am with diarrhea. Please advise

## 2020-06-28 NOTE — Telephone Encounter (Addendum)
OK  1) I have refilled lomotil - she should use that instead and stop prevalite  2) the lomotil Rx is written as needed but she will probably need to take it 1-2 x a day scheduled to control her diarrhea and may take up to 8 total in a aday otherwise

## 2020-07-01 ENCOUNTER — Other Ambulatory Visit: Payer: Self-pay | Admitting: Family Medicine

## 2020-07-09 ENCOUNTER — Ambulatory Visit (HOSPITAL_COMMUNITY): Payer: Medicare HMO

## 2020-07-09 ENCOUNTER — Encounter (HOSPITAL_COMMUNITY): Payer: Medicare HMO

## 2020-07-15 ENCOUNTER — Telehealth: Payer: Self-pay | Admitting: Internal Medicine

## 2020-07-15 NOTE — Telephone Encounter (Signed)
Patient informed and will try that.

## 2020-07-15 NOTE — Telephone Encounter (Signed)
Have her take two  20 mg omeprazole every AM for now and I will sort out things more after we see what happens with the diarrhea

## 2020-07-15 NOTE — Telephone Encounter (Signed)
I left her a detailed message and told her to call me back so I will know she got it.

## 2020-07-15 NOTE — Telephone Encounter (Signed)
Patient informed and wants to know if she stops her pantoprazole what can she take for her GERD Sir?

## 2020-07-15 NOTE — Telephone Encounter (Signed)
Donna Rogers has stopped her budesonide as instructed at her last office visit. The cholestyramine powder caused her to go more. She said the lomotil is causing her feet to swell and it doesn't work. She is going 7-8 times a day , diarrhea, no blood, no fever. She has a follow up appointment for 07/30/2020. Please advise?

## 2020-07-15 NOTE — Telephone Encounter (Signed)
While I believe her symptoms right now it does not make sense that the medications made things worse in my mind.   Looking back at things I wonder if it is not the pantoprazole I put her on causing diarrhea.  Have her stop that and see if that makes a difference.

## 2020-07-15 NOTE — Telephone Encounter (Signed)
Pt is requesting a call back from a nurse to discuss the LOMOTIL medication, pt states this medication is not helping her and would like to know if Dr Carlean Purl could prescribe something else.

## 2020-07-16 ENCOUNTER — Other Ambulatory Visit: Payer: Self-pay | Admitting: Family Medicine

## 2020-07-17 DIAGNOSIS — M25531 Pain in right wrist: Secondary | ICD-10-CM | POA: Diagnosis not present

## 2020-07-17 DIAGNOSIS — G5601 Carpal tunnel syndrome, right upper limb: Secondary | ICD-10-CM | POA: Insufficient documentation

## 2020-07-22 DIAGNOSIS — G5601 Carpal tunnel syndrome, right upper limb: Secondary | ICD-10-CM | POA: Diagnosis not present

## 2020-07-23 DIAGNOSIS — M5136 Other intervertebral disc degeneration, lumbar region: Secondary | ICD-10-CM | POA: Diagnosis not present

## 2020-07-23 DIAGNOSIS — K52832 Lymphocytic colitis: Secondary | ICD-10-CM | POA: Diagnosis not present

## 2020-07-23 DIAGNOSIS — R197 Diarrhea, unspecified: Secondary | ICD-10-CM | POA: Diagnosis not present

## 2020-07-23 DIAGNOSIS — R5382 Chronic fatigue, unspecified: Secondary | ICD-10-CM | POA: Diagnosis not present

## 2020-07-23 DIAGNOSIS — M150 Primary generalized (osteo)arthritis: Secondary | ICD-10-CM | POA: Diagnosis not present

## 2020-07-23 DIAGNOSIS — M8580 Other specified disorders of bone density and structure, unspecified site: Secondary | ICD-10-CM | POA: Diagnosis not present

## 2020-07-23 DIAGNOSIS — M0579 Rheumatoid arthritis with rheumatoid factor of multiple sites without organ or systems involvement: Secondary | ICD-10-CM | POA: Diagnosis not present

## 2020-07-23 DIAGNOSIS — Z6838 Body mass index (BMI) 38.0-38.9, adult: Secondary | ICD-10-CM | POA: Diagnosis not present

## 2020-07-23 DIAGNOSIS — E669 Obesity, unspecified: Secondary | ICD-10-CM | POA: Diagnosis not present

## 2020-07-24 ENCOUNTER — Ambulatory Visit: Payer: Medicare HMO | Admitting: Licensed Clinical Social Worker

## 2020-07-24 DIAGNOSIS — R1310 Dysphagia, unspecified: Secondary | ICD-10-CM

## 2020-07-24 DIAGNOSIS — E1159 Type 2 diabetes mellitus with other circulatory complications: Secondary | ICD-10-CM

## 2020-07-24 DIAGNOSIS — F410 Panic disorder [episodic paroxysmal anxiety] without agoraphobia: Secondary | ICD-10-CM

## 2020-07-24 DIAGNOSIS — F329 Major depressive disorder, single episode, unspecified: Secondary | ICD-10-CM

## 2020-07-24 DIAGNOSIS — E119 Type 2 diabetes mellitus without complications: Secondary | ICD-10-CM

## 2020-07-24 DIAGNOSIS — E785 Hyperlipidemia, unspecified: Secondary | ICD-10-CM

## 2020-07-24 DIAGNOSIS — E1169 Type 2 diabetes mellitus with other specified complication: Secondary | ICD-10-CM

## 2020-07-24 DIAGNOSIS — F32A Depression, unspecified: Secondary | ICD-10-CM

## 2020-07-24 DIAGNOSIS — F411 Generalized anxiety disorder: Secondary | ICD-10-CM

## 2020-07-24 NOTE — Chronic Care Management (AMB) (Signed)
Chronic Care Management    Clinical Social Work Follow Up Note  07/24/2020 Name: LINDEY RENZULLI MRN: [id number] DOB: April 03, 1951  AZILEE PIRRO is a 69 y.o. year old female who is a primary care patient of [name], DO. The CCM team was consulted for assistance with [employer] .   Review of patient status, including review of consultants reports, other relevant assessments, and collaboration with appropriate care team members and the patient's provider was performed as part of comprehensive patient evaluation and provision of chronic care management services.    SDOH (Social Determinants of Health) assessments performed: No;risk for tobacco use; risk for depression; risk for stress; risk for physical inactivity    Chronic Care Management from 03/06/2020 in [facility]  PHQ-9 Total Score 6       GAD 7 : Generalized Anxiety Score 03/06/2020 02/12/2020 10/11/2019 09/01/2019  Nervous, Anxious, on Edge 0 1 2 3   Control/stop worrying 1 2 2 2   Worry too much - different things 0 2 2 2   Trouble relaxing 0 2 2 1   Restless 0 1 0 0  Easily annoyed or irritable 0 3 1 2   Afraid - awful might happen 0 0 1 1  Total GAD 7 Score 1 11 10 11   Anxiety Difficulty Somewhat difficult Very difficult Somewhat difficult Somewhat difficult    Outpatient Encounter Medications as of 07/24/2020  Medication Sig  . amLODipine (NORVASC) 5 MG tablet TAKE 1 TABLET BY MOUTH DAILY.  Marland Kitchen aspirin 81 MG tablet Take 81 mg by mouth daily.  Marland Kitchen atenolol (TENORMIN) 25 MG tablet Take 1 tablet (25 mg total) by mouth daily. (Needs to be seen before next refill)  . azelastine (OPTIVAR) 0.05 % ophthalmic solution in the morning and at bedtime.  Marland Kitchen buPROPion (WELLBUTRIN XL) 150 MG 24 hr tablet Take 1 tablet (150 mg total) by mouth daily. (Needs to be seen before next refill)  . busPIRone (BUSPAR) 10 MG tablet TAKE  (1)  TABLET TWICE A DAY.  . cholecalciferol (VITAMIN D) 1000 UNITS tablet  Take 1,000 Units by mouth daily.  . cholestyramine light (PREVALITE) 4 g packet Take 1 packet (4 g total) by mouth at bedtime.  . citalopram (CELEXA) 40 MG tablet Take 1 tablet (40 mg total) by mouth daily. (Needs to be seen before next refill)  . Cyanocobalamin (VITAMIN B 12 PO) Take by mouth.  . diclofenac (VOLTAREN) 75 MG EC tablet TAKE 1 TABLET 2 TIMES A DAY AS NEEDED FOR MODERATE PAIN (use sparingly).  Marland Kitchen diphenoxylate-atropine (LOMOTIL) 2.5-0.025 MG tablet TAKE 1 OR 2 TABLETS FOUR TIMES DAILY AS NEEDED  . empagliflozin (JARDIANCE) 25 MG TABS tablet Take 1 tablet (25 mg total) by mouth daily before breakfast.  . gabapentin (NEURONTIN) 600 MG tablet Take 2 tablets (1,200 mg total) by mouth 3 (three) times daily.  Marland Kitchen glipiZIDE (GLUCOTROL) 10 MG tablet TAKE (1) TABLET TWICE A DAY BEFORE MEALS.  . HYDROcodone-acetaminophen (NORCO/VICODIN) 5-325 MG per tablet Take 1 tablet by mouth every 6 (six) hours as needed.  Marland Kitchen losartan (COZAAR) 100 MG tablet Take 1 tablet (100 mg total) by mouth daily.  . pantoprazole (PROTONIX) 40 MG tablet Take 1 tablet (40 mg total) by mouth daily before breakfast.  . Pyridoxine HCl (VITAMIN B-6 PO) Take by mouth.  . RESTASIS 0.05 % ophthalmic emulsion in the morning and at bedtime.   . simvastatin (ZOCOR) 40 MG tablet TAKE 1 TABLET DAILY  . SULFASALAZINE PO Take by mouth.  Marland Kitchen  vitamin C (ASCORBIC ACID) 500 MG tablet Take 500 mg by mouth daily.   Marland Kitchen zolpidem (AMBIEN) 5 MG tablet Take 1 tablet (5 mg total) by mouth at bedtime as needed for sleep (use sparingly).   No facility-administered encounter medications on file as of 07/24/2020.    Goals    .  Client will talk with [name] in next 30 days to discuss health needs of client and managing her health needs (pt-stated)      CARE PLAN ENTRY   Current Barriers:  . Pain needs (feet,back) . Mobility issues of client with chronic diagnoses of Dysphagia, GAD, HLD, DM Type 2, Depressive Disorder, GERD, Obesity , Edema,  HTN  Clinical Social Work Clinical Goal(s):  Marland Kitchen [name] to call client in next 30 days to talk about health needs of client and managing health needs of client  Interventions: Talked with client about her recent test on her right hand . Talked with client about pain issues of client . Talked with client about mobility issues of client (uses a walker as needed) . Encouraged client to call RNCM as needed for nursing support . Talked with client about upcoming client medical appointments . Talked with client about transport needs of client . Talked with client about support from Gastroenterologist (she said she has an appointment with Dr. Carlean Purl in December of 2021) . Talked with client about relaxation techniques (watches TV, take a nap, visiting with son who lives nearby, sits on porch) . Talked with client about her difficulty cooking . Provided counseling support for client . Talked with client about memory challenges of client (client said she has challenges with recent memory but can recall memories from the past) . Talked with [name] about medication administration (she said she uses a pill box to help with taking her meds ) . Talked with [name] about expense of obtaining Jardiance monthly (she said now her Vania Rea is more affordable) . Talked with client about her use of her left hand as needed   Patient Self Care Activities:   Completes ADLs as needed Attends medical appointments   Patient Self Care Deficits:  . Pain issues . Difficulty in mobility  Initial goal documentation     Follow Up Plan: [name] to call client/[name] next 4 weeks to talk with client/Leonabout health needs of client and her management of health needs faced  Norva Riffle.Titan Karner MSW, [name] Licensed Clinical Social Worker [facility] [phone]

## 2020-07-24 NOTE — Patient Instructions (Addendum)
Licensed Clinical Social Worker Visit Information  Goals we discussed today:    .  Client will talk with [name] in next 30 days to discuss health needs of client and managing her health needs (pt-stated)        CARE PLAN ENTRY   Current Barriers:   Pain needs (feet,back)  Mobility issues of client with chronic diagnoses of Dysphagia, GAD, HLD, DM Type 2, Depressive Disorder, GERD, Obesity , Edema, HTN  Clinical Social Work Clinical Goal(s):   [name] to call client in next 30 days to talk about health needs of client and managing health needs of client  Interventions: Talked with client about her recent test on her right hand  Talked with client about pain issues of client  Talked with client about mobility issues of client (uses a walker as needed)  Encouraged client to call RNCM as needed for nursing support  Talked with client about upcoming client medical appointments  Talked with client about transport needs of client  Talked with client about support from Gastroenterologist (she said she has an appointment with Dr. Carlean Purl in December of 2021)  Talked with client about relaxation techniques (watches TV, take a nap, visiting with son who lives nearby, sits on porch)  Talked with client about her difficulty cooking  Provided counseling support for client  Talked with client about memory challenges of client (client said she has challenges with recent memory but can recall memories from the past)  Talked with Donna Rogers about medication administration (she said she uses a pill box to help with taking her meds )  Talked with Donna Rogers about expense of obtaining Jardiance monthly (she said now her Vania Rea is more affordable)  Talked with client about her use of her left hand as needed   Patient Self Care Activities:   Completes ADLs as needed Attends medical appointments   Patient Self Care Deficits:   Pain issues  Difficulty in mobility  Initial goal  documentation     Follow Up Plan:[name] to call client/[name] next 4 weeks to talk with client/Leonabout health needs of client and her management of health needs faced  Materials Provided: No  The patient verbalized understanding of instructions provided today and declined a print copy of patient instruction materials.   Norva Riffle.Jesusita Jocelyn MSW, [name] Licensed Clinical Social Worker [facility] [phone]

## 2020-07-30 ENCOUNTER — Ambulatory Visit: Payer: Medicare HMO | Admitting: Internal Medicine

## 2020-07-31 DIAGNOSIS — G5601 Carpal tunnel syndrome, right upper limb: Secondary | ICD-10-CM | POA: Diagnosis not present

## 2020-07-31 DIAGNOSIS — M25531 Pain in right wrist: Secondary | ICD-10-CM | POA: Diagnosis not present

## 2020-07-31 DIAGNOSIS — M13849 Other specified arthritis, unspecified hand: Secondary | ICD-10-CM | POA: Diagnosis not present

## 2020-08-05 ENCOUNTER — Other Ambulatory Visit: Payer: Self-pay | Admitting: Family Medicine

## 2020-08-15 ENCOUNTER — Other Ambulatory Visit: Payer: Self-pay | Admitting: Family Medicine

## 2020-08-29 ENCOUNTER — Ambulatory Visit: Payer: Medicare Other | Admitting: Licensed Clinical Social Worker

## 2020-08-29 DIAGNOSIS — F410 Panic disorder [episodic paroxysmal anxiety] without agoraphobia: Secondary | ICD-10-CM

## 2020-08-29 DIAGNOSIS — E1159 Type 2 diabetes mellitus with other circulatory complications: Secondary | ICD-10-CM

## 2020-08-29 DIAGNOSIS — R1310 Dysphagia, unspecified: Secondary | ICD-10-CM

## 2020-08-29 DIAGNOSIS — E119 Type 2 diabetes mellitus without complications: Secondary | ICD-10-CM

## 2020-08-29 DIAGNOSIS — F32A Depression, unspecified: Secondary | ICD-10-CM

## 2020-08-29 DIAGNOSIS — I152 Hypertension secondary to endocrine disorders: Secondary | ICD-10-CM

## 2020-08-29 DIAGNOSIS — F329 Major depressive disorder, single episode, unspecified: Secondary | ICD-10-CM

## 2020-08-29 NOTE — Patient Instructions (Addendum)
Licensed Clinical Social Worker Visit Information  Goals we discussed today:   .  Client will talk with [name] in next 30 days to discuss health needs of client and managing her health needs (pt-stated)        CARE PLAN ENTRY   Current Barriers:   Pain needs (feet,back)  Mobility issues of client with chronic diagnoses of Dysphagia, GAD, HLD, DM Type 2, Depressive Disorder, GERD, Obesity , Edema, HTN  Clinical Social Work Clinical Goal(s):   [name] to call client in next 30 days to talk about health needs of client and managing health needs of client  Interventions:   Talked with client about her upcoming surgery on her right hand scheduled for 09/03/2020.  Talked with client about pain issues of client (spoke of pain issues in her right hand)  Talked with client about mobility issues of client (uses a walker as needed)  Encouraged client to call RNCM as needed for nursing support  Talked with client about upcoming client medical appointments  Talked with client about transport needs of client  Talked with client about support from Gastroenterologist   Talked with client about relaxation techniques (watches TV, take a nap, visiting with son who lives nearby, sits on porch)  Talked with client about her difficulty cooking  Provided counseling support for client  Talked with Donna Rogers about her managing arthritis  Talked with client about vision of client  (she has a prescription for new glasses)  Talked with client about memory challenges of client (client said she has challenges with recent memory but can recall memories from the past)  Talked with Donna Rogers about medication administration (she said she uses a pill box to help with taking her meds )  Client and [name] spoke of her challenges related to Diarrhea. She said she has talked with Gastroenterologist about this issue  Talked with Donna Rogers about her feet and she said she plans to talk with Dr. Lajuana Ripple about Diabetic  Shoes at client appointment tomorrow with Dr. Lajuana Ripple   Patient Self Care Activities:   Completes ADLs as needed Attends medical appointments   Patient Self Care Deficits:   Pain issues  Difficulty in mobility  Initial goal documentation     Follow Up Plan: [name] to call client/Donna Rogers next 4 weeks to talk with client/Leonabout health needs of client and about her management of health needs faced  Materials Provided: No  The patient verbalized understanding of instructions provided today and declined a print copy of patient instruction materials.   Donna Rogers MSW, [name] Licensed Clinical Social Worker [facility] [phone]

## 2020-08-29 NOTE — Chronic Care Management (AMB) (Signed)
Chronic Care Management    Clinical Social Work Follow Up Note  08/29/2020 Name: Donna Rogers MRN: [id number] DOB: 1950/09/03  Donna Rogers is a 70 y.o. year old female who is a primary care patient of Donna Norlander, DO. The CCM team was consulted for assistance with [employer] .   Review of patient status, including review of consultants reports, other relevant assessments, and collaboration with appropriate care team members and the patient's provider was performed as part of comprehensive patient evaluation and provision of chronic care management services.    SDOH (Social Determinants of Health) assessments performed: No; risk for tobacco use; risk for financial strain; risk for depression; risk for stress; risk for physical inactivity  Flowsheet Row Chronic Care Management from 03/06/2020 in [facility]  PHQ-9 Total Score 6       GAD 7 : Generalized Anxiety Score 03/06/2020 02/12/2020 10/11/2019 09/01/2019  Nervous, Anxious, on Edge 0 1 2 3   Control/stop worrying 1 2 2 2   Worry too much - different things 0 2 2 2   Trouble relaxing 0 2 2 1   Restless 0 1 0 0  Easily annoyed or irritable 0 3 1 2   Afraid - awful might happen 0 0 1 1  Total GAD 7 Score 1 11 10 11   Anxiety Difficulty Somewhat difficult Very difficult Somewhat difficult Somewhat difficult    Outpatient Encounter Medications as of 08/29/2020  Medication Sig  . amLODipine (NORVASC) 5 MG tablet TAKE 1 TABLET BY MOUTH DAILY.  Marland Kitchen aspirin 81 MG tablet Take 81 mg by mouth daily.  Marland Kitchen atenolol (TENORMIN) 25 MG tablet Take 1 tablet (25 mg total) by mouth daily. (Needs to be seen before next refill)  . azelastine (OPTIVAR) 0.05 % ophthalmic solution in the morning and at bedtime.  Marland Kitchen buPROPion (WELLBUTRIN XL) 150 MG 24 hr tablet Take 1 tablet (150 mg total) by mouth daily. (Needs to be seen before next refill)  . busPIRone (BUSPAR) 10 MG tablet TAKE  (1)  TABLET TWICE A DAY.  .  cholecalciferol (VITAMIN D) 1000 UNITS tablet Take 1,000 Units by mouth daily.  . cholestyramine light (PREVALITE) 4 g packet Take 1 packet (4 g total) by mouth at bedtime.  . citalopram (CELEXA) 40 MG tablet TAKE 1 TABLET EVERY DAY (Needs to be seen)  . Cyanocobalamin (VITAMIN B 12 PO) Take by mouth.  . diclofenac (VOLTAREN) 75 MG EC tablet TAKE 1 TABLET 2 TIMES A DAY AS NEEDED FOR MODERATE PAIN (use sparingly).  Marland Kitchen diphenoxylate-atropine (LOMOTIL) 2.5-0.025 MG tablet TAKE 1 OR 2 TABLETS FOUR TIMES DAILY AS NEEDED  . empagliflozin (JARDIANCE) 25 MG TABS tablet Take 1 tablet (25 mg total) by mouth daily before breakfast.  . gabapentin (NEURONTIN) 600 MG tablet Take 2 tablets (1,200 mg total) by mouth 3 (three) times daily.  Marland Kitchen glipiZIDE (GLUCOTROL) 10 MG tablet TAKE (1) TABLET TWICE A DAY BEFORE MEALS.  . HYDROcodone-acetaminophen (NORCO/VICODIN) 5-325 MG per tablet Take 1 tablet by mouth every 6 (six) hours as needed.  Marland Kitchen losartan (COZAAR) 100 MG tablet Take 1 tablet (100 mg total) by mouth daily.  . pantoprazole (PROTONIX) 40 MG tablet Take 1 tablet (40 mg total) by mouth daily before breakfast.  . Pyridoxine HCl (VITAMIN B-6 PO) Take by mouth.  . RESTASIS 0.05 % ophthalmic emulsion in the morning and at bedtime.   . simvastatin (ZOCOR) 40 MG tablet TAKE 1 TABLET DAILY  . SULFASALAZINE PO Take by mouth.  Marland Kitchen  vitamin C (ASCORBIC ACID) 500 MG tablet Take 500 mg by mouth daily.   Marland Kitchen zolpidem (AMBIEN) 5 MG tablet Take 1 tablet (5 mg total) by mouth at bedtime as needed for sleep (use sparingly).   No facility-administered encounter medications on file as of 08/29/2020.    Goals    .  Client will talk with [name] in next 30 days to discuss health needs of client and managing her health needs (pt-stated)      CARE PLAN ENTRY   Current Barriers:  . Pain needs (feet,back) . Mobility issues of client with chronic diagnoses of Dysphagia, GAD, HLD, DM Type 2, Depressive Disorder, GERD, Obesity , Edema,  HTN  Clinical Social Work Clinical Goal(s):  Marland Kitchen [name] to call client in next 30 days to talk about health needs of client and managing health needs of client  Interventions:   Talked with client about her upcoming surgery on her right hand scheduled for 09/03/2020. . Talked with client about pain issues of client (spoke of pain issues in her right hand) . Talked with client about mobility issues of client (uses a walker as needed) . Encouraged client to call RNCM as needed for nursing support . Talked with client about upcoming client medical appointments . Talked with client about transport needs of client . Talked with client about support from Gastroenterologist  . Talked with client about relaxation techniques (watches TV, take a nap, visiting with son who lives nearby, sits on porch) . Talked with client about her difficulty cooking . Provided counseling support for client . Talked with Donna Rogers about her managing arthritis . Talked with client about vision of client  (she has a prescription for new glasses) . Talked with client about memory challenges of client (client said she has challenges with recent memory but can recall memories from the past) . Talked with Donna Rogers about medication administration (she said she uses a pill box to help with taking her meds ) . Client and [name] spoke of her challenges related to Diarrhea. She said she has talked with Gastroenterologist about this issue . Talked with Donna Rogers about her feet and she said she plans to talk with Dr. Lajuana Ripple about Diabetic Shoes at client appointment tomorrow with Dr. Lajuana Ripple   Patient Self Care Activities:   Completes ADLs as needed Attends medical appointments   Patient Self Care Deficits:  . Pain issues . Difficulty in mobility  Initial goal documentation     Follow Up Plan: [name] to call client/Donna Rogers next 4 weeks to talk with client/Leonabout health needs of client and about her management of health needs  faced  [name] MSW, [name] Licensed Clinical Social Worker [facility] [phone]

## 2020-08-30 ENCOUNTER — Ambulatory Visit: Payer: Medicare Other

## 2020-08-30 ENCOUNTER — Ambulatory Visit (INDEPENDENT_AMBULATORY_CARE_PROVIDER_SITE_OTHER): Payer: Medicare Other | Admitting: Family Medicine

## 2020-08-30 DIAGNOSIS — E785 Hyperlipidemia, unspecified: Secondary | ICD-10-CM | POA: Diagnosis not present

## 2020-08-30 DIAGNOSIS — Z130 Encounter for screening for diseases of the blood and blood-forming organs and certain disorders involving the immune mechanism: Secondary | ICD-10-CM

## 2020-08-30 DIAGNOSIS — E1159 Type 2 diabetes mellitus with other circulatory complications: Secondary | ICD-10-CM | POA: Diagnosis not present

## 2020-08-30 DIAGNOSIS — E1169 Type 2 diabetes mellitus with other specified complication: Secondary | ICD-10-CM | POA: Diagnosis not present

## 2020-08-30 DIAGNOSIS — I152 Hypertension secondary to endocrine disorders: Secondary | ICD-10-CM | POA: Diagnosis not present

## 2020-08-30 DIAGNOSIS — E1165 Type 2 diabetes mellitus with hyperglycemia: Secondary | ICD-10-CM | POA: Diagnosis not present

## 2020-08-30 MED ORDER — PANTOPRAZOLE SODIUM 40 MG PO TBEC
40.0000 mg | DELAYED_RELEASE_TABLET | Freq: Every day | ORAL | 3 refills | Status: DC
Start: 2020-08-30 — End: 2020-08-30

## 2020-08-30 MED ORDER — OMEPRAZOLE 20 MG PO CPDR: 20.0000 mg | DELAYED_RELEASE_CAPSULE | Freq: Every day | ORAL | 3 refills | Status: DC

## 2020-08-30 NOTE — Progress Notes (Signed)
Telephone visit  Subjective: CC: DM, HTN, HLD, GERD PCP: [name], DO WNI:[id number] Donna Rogers is Donna 70 y.o. female calls for telephone consult today. Patient provides verbal consent for consult held via phone.  Due to COVID-19 pandemic this visit was conducted virtually. This visit type was conducted due to national recommendations for restrictions regarding the COVID-19 Pandemic (e.g. social distancing, sheltering in place) in an effort to limit this patient's exposure and mitigate transmission in our community. All issues noted in this document were discussed and addressed.  Donna physical exam was not performed with this format.   Location of patient: home Location of provider: [facility] Others present for call: none  1. DM, HTN, HLD Patient reports compliance with all of her medications.  She admits that she has been under Donna lot more stress lately due to the pain that she is experiencing in her hand.  Blood sugars continue to fluctuate well into the 150s and 160s.  She needs Donna new prescription for diabetic shoes as her previous one expired.  2. GERD Needs refill on PPI.  She discontinued omeprazole and was placed on Protonix but does not find this to be as helpful.  She like to go back to omeprazole.  No reports of nausea, vomiting or hematochezia   ROS: Per HPI  Allergies  Allergen Reactions  . Ciprofloxacin Hcl Rash   Past Medical History:  Diagnosis Date  . Allergy    seasonal allergies  . Anemia    hx of  . Arthritis    rheumatoid  . Depression    on meds  . Diabetes ([place])   . Diabetic peripheral neuropathy ([place])   . GERD (gastroesophageal reflux disease)    on meds  . Headache   . Hyperlipidemia    on meds  . Hyperplastic rectal polyp 10/21/2015  . Hypertension    pt. denies  . Lymphocytic colitis   . Neuropathy   . Rheumatoid arthritis ([place])   . Shortness of breath dyspnea    with exertion    Current Outpatient Medications:  .  amLODipine (NORVASC) 5 MG  tablet, TAKE 1 TABLET BY MOUTH DAILY., Disp: 90 tablet, Rfl: 0 .  aspirin 81 MG tablet, Take 81 mg by mouth daily., Disp: , Rfl:  .  atenolol (TENORMIN) 25 MG tablet, Take 1 tablet (25 mg total) by mouth daily. (Needs to be seen before next refill), Disp: 30 tablet, Rfl: 0 .  azelastine (OPTIVAR) 0.05 % ophthalmic solution, in the morning and at bedtime., Disp: , Rfl:  .  buPROPion (WELLBUTRIN XL) 150 MG 24 hr tablet, Take 1 tablet (150 mg total) by mouth daily. (Needs to be seen before next refill), Disp: 30 tablet, Rfl: 0 .  busPIRone (BUSPAR) 10 MG tablet, TAKE  (1)  TABLET TWICE Donna DAY., Disp: 60 tablet, Rfl: 0 .  cholecalciferol (VITAMIN D) 1000 UNITS tablet, Take 1,000 Units by mouth daily., Disp: , Rfl:  .  cholestyramine light (PREVALITE) 4 g packet, Take 1 packet (4 g total) by mouth at bedtime., Disp: 90 packet, Rfl: 1 .  citalopram (CELEXA) 40 MG tablet, TAKE 1 TABLET EVERY DAY (Needs to be seen), Disp: 90 tablet, Rfl: 0 .  Cyanocobalamin (VITAMIN B 12 PO), Take by mouth., Disp: , Rfl:  .  diclofenac (VOLTAREN) 75 MG EC tablet, TAKE 1 TABLET 2 TIMES Donna DAY AS NEEDED FOR MODERATE PAIN (use sparingly)., Disp: 30 tablet, Rfl: 0 .  diphenoxylate-atropine (LOMOTIL) 2.5-0.025 MG tablet, TAKE 1  OR 2 TABLETS FOUR TIMES DAILY AS NEEDED, Disp: 90 tablet, Rfl: 1 .  empagliflozin (JARDIANCE) 25 MG TABS tablet, Take 1 tablet (25 mg total) by mouth daily before breakfast., Disp: 30 tablet, Rfl: 2 .  gabapentin (NEURONTIN) 600 MG tablet, Take 2 tablets (1,200 mg total) by mouth 3 (three) times daily., Disp: 180 tablet, Rfl: 3 .  glipiZIDE (GLUCOTROL) 10 MG tablet, TAKE (1) TABLET TWICE Donna DAY BEFORE MEALS., Disp: 180 tablet, Rfl: 0 .  HYDROcodone-acetaminophen (NORCO/VICODIN) 5-325 MG per tablet, Take 1 tablet by mouth every 6 (six) hours as needed., Disp: 60 tablet, Rfl: 0 .  losartan (COZAAR) 100 MG tablet, Take 1 tablet (100 mg total) by mouth daily., Disp: 90 tablet, Rfl: 3 .  pantoprazole (PROTONIX)  40 MG tablet, Take 1 tablet (40 mg total) by mouth daily before breakfast., Disp: 90 tablet, Rfl: 3 .  Pyridoxine HCl (VITAMIN B-6 PO), Take by mouth., Disp: , Rfl:  .  RESTASIS 0.05 % ophthalmic emulsion, in the morning and at bedtime. , Disp: , Rfl:  .  simvastatin (ZOCOR) 40 MG tablet, TAKE 1 TABLET DAILY, Disp: 90 tablet, Rfl: 0 .  SULFASALAZINE PO, Take by mouth., Disp: , Rfl:  .  vitamin C (ASCORBIC ACID) 500 MG tablet, Take 500 mg by mouth daily. , Disp: , Rfl:  .  zolpidem (AMBIEN) 5 MG tablet, Take 1 tablet (5 mg total) by mouth at bedtime as needed for sleep (use sparingly)., Disp: 20 tablet, Rfl: 1  Assessment/ Plan: 70 y.o. female   Uncontrolled type 2 diabetes mellitus with hyperglycemia ([place]) - Plan: Bayer DCA Hb A1c Waived  Hyperlipidemia associated with type 2 diabetes mellitus ([place]) - Plan: CMP14+EGFR, Lipid panel  Hypertension associated with diabetes ([place])  Screening, anemia, deficiency, iron - Plan: CBC  She will come in for her labs.  Dissipate that she will be undergoing surgery.  We discussed the risk of uncontrolled diabetes and poor healing.  She voiced good understanding.  Start time: 1:02pm; 1:04pm; 1:07pm End time: 1:15pm  Total time spent on patient care (including telephone call/ virtual visit): 8 minutes  [name], [place] [phone]

## 2020-09-03 DIAGNOSIS — G5601 Carpal tunnel syndrome, right upper limb: Secondary | ICD-10-CM | POA: Diagnosis not present

## 2020-09-05 ENCOUNTER — Ambulatory Visit: Payer: Medicare HMO | Admitting: Internal Medicine

## 2020-09-05 NOTE — Telephone Encounter (Signed)
Left message for patient to call back  

## 2020-09-05 NOTE — Telephone Encounter (Signed)
Pt would like something else prescribed for diarrhea. She stated that the medication that she was put on is not working. Pt had an appt this morning with Dr. Carlean Purl but called to r/s because she had surgery recently and cannot make it to appt. Today.

## 2020-09-06 NOTE — Telephone Encounter (Signed)
Patient has scheduled an office visit to discuss

## 2020-09-16 ENCOUNTER — Other Ambulatory Visit: Payer: Self-pay | Admitting: Family Medicine

## 2020-09-17 DIAGNOSIS — M25531 Pain in right wrist: Secondary | ICD-10-CM | POA: Diagnosis not present

## 2020-09-27 ENCOUNTER — Other Ambulatory Visit: Payer: Self-pay | Admitting: Family Medicine

## 2020-10-02 ENCOUNTER — Ambulatory Visit (INDEPENDENT_AMBULATORY_CARE_PROVIDER_SITE_OTHER): Payer: Medicare Other | Admitting: Licensed Clinical Social Worker

## 2020-10-02 DIAGNOSIS — E1159 Type 2 diabetes mellitus with other circulatory complications: Secondary | ICD-10-CM

## 2020-10-02 DIAGNOSIS — E785 Hyperlipidemia, unspecified: Secondary | ICD-10-CM | POA: Diagnosis not present

## 2020-10-02 DIAGNOSIS — I152 Hypertension secondary to endocrine disorders: Secondary | ICD-10-CM | POA: Diagnosis not present

## 2020-10-02 DIAGNOSIS — E119 Type 2 diabetes mellitus without complications: Secondary | ICD-10-CM | POA: Diagnosis not present

## 2020-10-02 DIAGNOSIS — F329 Major depressive disorder, single episode, unspecified: Secondary | ICD-10-CM

## 2020-10-02 DIAGNOSIS — E1169 Type 2 diabetes mellitus with other specified complication: Secondary | ICD-10-CM | POA: Diagnosis not present

## 2020-10-02 DIAGNOSIS — F411 Generalized anxiety disorder: Secondary | ICD-10-CM

## 2020-10-02 DIAGNOSIS — R1310 Dysphagia, unspecified: Secondary | ICD-10-CM

## 2020-10-02 DIAGNOSIS — F410 Panic disorder [episodic paroxysmal anxiety] without agoraphobia: Secondary | ICD-10-CM

## 2020-10-02 DIAGNOSIS — F32A Depression, unspecified: Secondary | ICD-10-CM

## 2020-10-02 NOTE — Patient Instructions (Addendum)
Licensed Clinical Social Worker Visit Information  Goals we discussed today:  .  Client will talk with [name] in next 30 days to discuss health needs of client and managing her health needs (pt-stated)        CARE PLAN ENTRY   Current Barriers:   Pain needs (feet,back)  Mobility issues of client with chronic diagnoses of Dysphagia, GAD, HLD, DM Type 2, Depressive Disorder, GERD, Obesity , Edema, HTN  Clinical Social Work Clinical Goal(s):   [name] to call client in next 30 days to talk about health needs of client and managing health needs of client  Interventions:   Talked with [name], regarding needs of clent  Talked with Milbert Coulter  about pain issues of client (client has pain in her feet and hands)  Talked with Milbert Coulter about client recent hand surgery  Talked with Milbert Coulter about medication procurement for client  Talked with Milbert Coulter about mobility issues of client (uses a walker as needed)  Encouraged client/[name] call RNCM as needed for nursing support  Talked with Milbert Coulter about upcoming client medical appointments  Talked with Milbert Coulter about transport needs of client  Talked with Milbert Coulter about relaxation techniques (watches TV, take a nap, visiting with son who lives nearby, sits on porch)  Talked with Milbert Coulter about client challenges in cooking  Talked with Milbert Coulter about memory challenges of client (client has challenges with recent memory but can recall memories from the past)  Talked with Milbert Coulter about costs of client medications Milbert Coulter said costs of client medicines is more affordable now)  Talk with Milbert Coulter about sleeping challenges of client  Talked with Milbert Coulter about family support for client    Patient Self Care Activities:   Completes ADLs as needed Attends medical appointments   Patient Self Care Deficits:   Pain issues  Difficulty in mobility  Initial goal documentation     Follow Up Plan:  [name] to call client/[name] next 4 weeks to talk with  client/Leonabout health needs of client andabouther management of health needs faced  Materials Provided: No  The patient/[name] Lissa Merlin  verbalized understanding of instructions provided today and declined a print copy of patient instruction materials.   Norva Riffle.Krina Mraz MSW, [name] Licensed Clinical Social Worker [facility] Care Management [phone]

## 2020-10-02 NOTE — Chronic Care Management (AMB) (Signed)
Chronic Care Management    Clinical Social Work Follow Up Note  10/02/2020 Name: Donna Rogers MRN: [id number] DOB: Aug 31, 1950  Donna Rogers is a 70 y.o. year old female who is a primary care patient of [name], DO. The CCM team was consulted for assistance with [employer] .   Review of patient status, including review of consultants reports, other relevant assessments, and collaboration with appropriate care team members and the patient's provider was performed as part of comprehensive patient evaluation and provision of chronic care management services.    SDOH (Social Determinants of Health) assessments performed: No; risk for tobacco use; risk for depression; risk for stress; risk for physical inactivity  Flowsheet Row Chronic Care Management from 03/06/2020 in [place]  PHQ-9 Total Score 6      GAD 7 : Generalized Anxiety Score 03/06/2020 02/12/2020 10/11/2019 09/01/2019  Nervous, Anxious, on Edge 0 1 2 3   Control/stop worrying 1 2 2 2   Worry too much - different things 0 2 2 2   Trouble relaxing 0 2 2 1   Restless 0 1 0 0  Easily annoyed or irritable 0 3 1 2   Afraid - awful might happen 0 0 1 1  Total GAD 7 Score 1 11 10 11   Anxiety Difficulty Somewhat difficult Very difficult Somewhat difficult Somewhat difficult    Outpatient Encounter Medications as of 10/02/2020  Medication Sig  . amLODipine (NORVASC) 5 MG tablet TAKE 1 TABLET BY MOUTH DAILY.  Marland Kitchen aspirin 81 MG tablet Take 81 mg by mouth daily.  Marland Kitchen atenolol (TENORMIN) 25 MG tablet Take 1 tablet (25 mg total) by mouth daily. (Needs to be seen before next refill)  . azelastine (OPTIVAR) 0.05 % ophthalmic solution in the morning and at bedtime.  Marland Kitchen buPROPion (WELLBUTRIN XL) 150 MG 24 hr tablet Take 1 tablet (150 mg total) by mouth daily. (Needs to be seen before next refill)  . busPIRone (BUSPAR) 10 MG tablet TAKE  (1)  TABLET TWICE A DAY.  . cholecalciferol (VITAMIN D) 1000  UNITS tablet Take 1,000 Units by mouth daily.  . cholestyramine light (PREVALITE) 4 g packet Take 1 packet (4 g total) by mouth at bedtime.  . citalopram (CELEXA) 40 MG tablet TAKE 1 TABLET EVERY DAY (Needs to be seen)  . Cyanocobalamin (VITAMIN B 12 PO) Take by mouth.  . diclofenac (VOLTAREN) 75 MG EC tablet TAKE 1 TABLET 2 TIMES A DAY AS NEEDED FOR MODERATE PAIN (use sparingly).  Marland Kitchen diphenoxylate-atropine (LOMOTIL) 2.5-0.025 MG tablet TAKE 1 OR 2 TABLETS FOUR TIMES DAILY AS NEEDED  . empagliflozin (JARDIANCE) 25 MG TABS tablet Take 1 tablet (25 mg total) by mouth daily before breakfast.  . gabapentin (NEURONTIN) 600 MG tablet Take 2 tablets (1,200 mg total) by mouth 3 (three) times daily.  Marland Kitchen glipiZIDE (GLUCOTROL) 10 MG tablet TAKE (1) TABLET TWICE A DAY BEFORE MEALS.  . HYDROcodone-acetaminophen (NORCO/VICODIN) 5-325 MG per tablet Take 1 tablet by mouth every 6 (six) hours as needed.  Marland Kitchen losartan (COZAAR) 100 MG tablet Take 1 tablet (100 mg total) by mouth daily.  Marland Kitchen omeprazole (PRILOSEC) 20 MG capsule Take 1 capsule (20 mg total) by mouth daily. Please cancel protonix  . Pyridoxine HCl (VITAMIN B-6 PO) Take by mouth.  . RESTASIS 0.05 % ophthalmic emulsion in the morning and at bedtime.   . simvastatin (ZOCOR) 40 MG tablet TAKE 1 TABLET DAILY  . SULFASALAZINE PO Take by mouth.  . vitamin C (ASCORBIC ACID)  500 MG tablet Take 500 mg by mouth daily.   Marland Kitchen zolpidem (AMBIEN) 5 MG tablet Take 1 tablet (5 mg total) by mouth at bedtime as needed for sleep (use sparingly).   No facility-administered encounter medications on file as of 10/02/2020.    Goals    .  Client will talk with [name] in next 30 days to discuss health needs of client and managing her health needs (pt-stated)      CARE PLAN ENTRY   Current Barriers:  . Pain needs (feet,back) . Mobility issues of client with chronic diagnoses of Dysphagia, GAD, HLD, DM Type 2, Depressive Disorder, GERD, Obesity , Edema, HTN  Clinical Social Work  Clinical Goal(s):  Marland Kitchen [name] to call client in next 30 days to talk about health needs of client and managing health needs of client  Interventions:   Talked with [name], regarding needs of clent . Talked with Milbert Coulter  about pain issues of client (client has pain in her feet and hands) . Talked with Milbert Coulter about client recent hand surgery . Talked with Milbert Coulter about medication procurement for client . Talked with Milbert Coulter about mobility issues of client (uses a walker as needed) . Encouraged client/Donna call RNCM as needed for nursing support . Talked with Milbert Coulter about upcoming client medical appointments . Talked with Milbert Coulter about transport needs of client . Talked with Milbert Coulter about relaxation techniques (watches TV, take a nap, visiting with son who lives nearby, sits on porch) . Talked with Milbert Coulter about client challenges in cooking . Talked with Milbert Coulter about memory challenges of client (client has challenges with recent memory but can recall memories from the past)  Talked with Milbert Coulter about costs of client medications Milbert Coulter said costs of client medicines is more affordable now)  Talk with Milbert Coulter about sleeping challenges of client  Talked with Milbert Coulter about family support for client    Patient Self Care Activities:   Completes ADLs as needed Attends medical appointments   Patient Self Care Deficits:  . Pain issues . Difficulty in mobility  Initial goal documentation     Follow Up Plan:  [name] to call client/Donna Rogers next 4 weeks to talk with client/Leonabout health needs of client and about her management of health needs faced  Norva Riffle.Arlynn Mcdermid MSW, [name] Licensed Clinical Social Worker [facility] Care Management [phone]

## 2020-10-04 ENCOUNTER — Ambulatory Visit: Payer: Self-pay | Admitting: Licensed Clinical Social Worker

## 2020-10-04 ENCOUNTER — Other Ambulatory Visit: Payer: Self-pay | Admitting: Family Medicine

## 2020-10-04 DIAGNOSIS — R1310 Dysphagia, unspecified: Secondary | ICD-10-CM

## 2020-10-04 DIAGNOSIS — E119 Type 2 diabetes mellitus without complications: Secondary | ICD-10-CM

## 2020-10-04 DIAGNOSIS — I152 Hypertension secondary to endocrine disorders: Secondary | ICD-10-CM

## 2020-10-04 DIAGNOSIS — E785 Hyperlipidemia, unspecified: Secondary | ICD-10-CM

## 2020-10-04 DIAGNOSIS — G629 Polyneuropathy, unspecified: Secondary | ICD-10-CM

## 2020-10-04 DIAGNOSIS — E1169 Type 2 diabetes mellitus with other specified complication: Secondary | ICD-10-CM

## 2020-10-04 DIAGNOSIS — F410 Panic disorder [episodic paroxysmal anxiety] without agoraphobia: Secondary | ICD-10-CM

## 2020-10-04 DIAGNOSIS — F329 Major depressive disorder, single episode, unspecified: Secondary | ICD-10-CM

## 2020-10-04 DIAGNOSIS — F32A Depression, unspecified: Secondary | ICD-10-CM

## 2020-10-04 DIAGNOSIS — E1159 Type 2 diabetes mellitus with other circulatory complications: Secondary | ICD-10-CM

## 2020-10-04 NOTE — Chronic Care Management (AMB) (Signed)
Chronic Care Management    Clinical Social Work Follow Up Note  10/04/2020 Name: Donna Rogers MRN: [id number] DOB: 07-02-1951  Donna Rogers is a 70 y.o. year old female who is a primary care patient of Donna Norlander, DO. The CCM team was consulted for assistance with Donna Rogers .   Review of patient status, including review of consultants reports, other relevant assessments, and collaboration with appropriate care team members and the patient's provider was performed as part of comprehensive patient evaluation and provision of chronic care management services.    SDOH (Social Determinants of Health) assessments performed: No; risk for depression; risk for tobacco use; risk for stress; risk for physical inactivity.   Flowsheet Row Chronic Care Management from 03/06/2020 in [place]  PHQ-9 Total Score 6      GAD 7 : Generalized Anxiety Score 03/06/2020 02/12/2020 10/11/2019 09/01/2019  Nervous, Anxious, on Edge 0 1 2 3   Control/stop worrying 1 2 2 2   Worry too much - different things 0 2 2 2   Trouble relaxing 0 2 2 1   Restless 0 1 0 0  Easily annoyed or irritable 0 3 1 2   Afraid - awful might happen 0 0 1 1  Total GAD 7 Score 1 11 10 11   Anxiety Difficulty Somewhat difficult Very difficult Somewhat difficult Somewhat difficult    Outpatient Encounter Medications as of 10/04/2020  Medication Sig  . amLODipine (NORVASC) 5 MG tablet TAKE 1 TABLET BY MOUTH DAILY.  Marland Kitchen aspirin 81 MG tablet Take 81 mg by mouth daily.  Marland Kitchen atenolol (TENORMIN) 25 MG tablet Take 1 tablet (25 mg total) by mouth daily. (Needs to be seen before next refill)  . azelastine (OPTIVAR) 0.05 % ophthalmic solution in the morning and at bedtime.  Marland Kitchen buPROPion (WELLBUTRIN XL) 150 MG 24 hr tablet Take 1 tablet (150 mg total) by mouth daily. (Needs to be seen before next refill)  . busPIRone (BUSPAR) 10 MG tablet TAKE  (1)  TABLET TWICE A DAY.  . cholecalciferol (VITAMIN D) 1000  UNITS tablet Take 1,000 Units by mouth daily.  . cholestyramine light (PREVALITE) 4 g packet Take 1 packet (4 g total) by mouth at bedtime.  . citalopram (CELEXA) 40 MG tablet TAKE 1 TABLET EVERY DAY (Needs to be seen)  . Cyanocobalamin (VITAMIN B 12 PO) Take by mouth.  . diclofenac (VOLTAREN) 75 MG EC tablet TAKE 1 TABLET 2 TIMES A DAY AS NEEDED FOR MODERATE PAIN (use sparingly).  Marland Kitchen diphenoxylate-atropine (LOMOTIL) 2.5-0.025 MG tablet TAKE 1 OR 2 TABLETS FOUR TIMES DAILY AS NEEDED  . empagliflozin (JARDIANCE) 25 MG TABS tablet Take 1 tablet (25 mg total) by mouth daily before breakfast.  . gabapentin (NEURONTIN) 600 MG tablet Take 2 tablets (1,200 mg total) by mouth 3 (three) times daily.  Marland Kitchen glipiZIDE (GLUCOTROL) 10 MG tablet TAKE (1) TABLET TWICE A DAY BEFORE MEALS.  . HYDROcodone-acetaminophen (NORCO/VICODIN) 5-325 MG per tablet Take 1 tablet by mouth every 6 (six) hours as needed.  Marland Kitchen losartan (COZAAR) 100 MG tablet Take 1 tablet (100 mg total) by mouth daily.  Marland Kitchen omeprazole (PRILOSEC) 20 MG capsule Take 1 capsule (20 mg total) by mouth daily. Please cancel protonix  . Pyridoxine HCl (VITAMIN B-6 PO) Take by mouth.  . RESTASIS 0.05 % ophthalmic emulsion in the morning and at bedtime.   . simvastatin (ZOCOR) 40 MG tablet TAKE 1 TABLET DAILY  . SULFASALAZINE PO Take by mouth.  . vitamin C (ASCORBIC  ACID) 500 MG tablet Take 500 mg by mouth daily.   Marland Kitchen zolpidem (AMBIEN) 5 MG tablet Take 1 tablet (5 mg total) by mouth at bedtime as needed for sleep (use sparingly).   No Rogers-administered encounter medications on file as of 10/04/2020.    Goals    .  Client will talk with [name] in next 30 days to discuss health needs of client and managing her health needs (pt-stated)      CARE PLAN ENTRY   Current Barriers:  . Pain needs (feet,back) . Mobility issues of client with chronic diagnoses of Dysphagia, GAD, HLD, DM Type 2, Depressive Disorder, GERD, Obesity , Edema, HTN  Clinical Social Work  Clinical Goal(s):  Marland Kitchen [name] to call client in next 30 days to talk about health needs of client and managing health needs of client  Interventions: Talked with Donna Rogers, significant other to client, about client needs Talked with Donna Rogers about medication issues for client [name] collaborated with Donna Rogers Donna Rogers today regarding client medication need [name] informed Donna Rogers of Donna Rogers recommendations regarding medication refill need of client  Patient Self Care Activities:   Completes ADLs as needed Attends medical appointments   Patient Self Care Deficits:  . Pain issues . Difficulty in mobility  Initial goal documentation     Follow Up Plan:  [name] to call client/Donna Rogers next 4 weeks to talk with client/Leonabout health needs of client andabouther management of health needs faced  Donna Rogers.Donna Rogers MSW, [name] Licensed Clinical Social Worker [facility] Care Management [phone]

## 2020-10-04 NOTE — Patient Instructions (Addendum)
Licensed Clinical Social Worker Visit Information  Goals we discussed today:   .  Client will talk with [name] in next 30 days to discuss health needs of client and managing her health needs (pt-stated)        CARE PLAN ENTRY   Current Barriers:   Pain needs (feet,back)  Mobility issues of client with chronic diagnoses of Dysphagia, GAD, HLD, DM Type 2, Depressive Disorder, GERD, Obesity , Edema, HTN  Clinical Social Work Clinical Goal(s):   [name] to call client in next 30 days to talk about health needs of client and managing health needs of client  Interventions: Talked with Donna Rogers, significant other to client, about client needs Talked with Donna Rogers about medication issues for client [name] collaborated with Donna Rogers today regarding client medication need [name] informed Donna Rogers of [facility] recommendations regarding medication refill need of client  Patient Self Care Activities:   Completes ADLs as needed Attends medical appointments   Patient Self Care Deficits:   Pain issues  Difficulty in mobility  Initial goal documentation     Follow Up Plan:[name] to call client/Donna Rogers next 4 weeks to talk with client/Leonabout health needs of client andabouther management of health needs faced  Materials Provided: No  The patient/Donna Ellis,significant other, verbalized understanding of instructions provided today and declined a print copy of patient instruction materials.   Donna Rogers MSW, [name] Licensed Clinical Social Worker [facility] Care Management [phone]

## 2020-10-04 NOTE — Telephone Encounter (Signed)
  Prescription Request  10/04/2020  What is the name of the medication or equipment? gabapentin (NEURONTIN) 600 MG tablet    Have you contacted your pharmacy to request a refill? (if applicable) yes, PT has appt with DR. Darnell Level. On 03/01/222 for Med Rck she is about to run out needs enough until appt,  Which pharmacy would you like this sent to? [facility]   Patient notified that their request is being sent to the clinical staff for review and that they should receive a response within 2 business days.

## 2020-10-14 ENCOUNTER — Other Ambulatory Visit: Payer: Self-pay | Admitting: Family Medicine

## 2020-10-14 DIAGNOSIS — M25532 Pain in left wrist: Secondary | ICD-10-CM | POA: Diagnosis not present

## 2020-10-14 DIAGNOSIS — I10 Essential (primary) hypertension: Secondary | ICD-10-CM

## 2020-10-14 DIAGNOSIS — R2231 Localized swelling, mass and lump, right upper limb: Secondary | ICD-10-CM | POA: Diagnosis not present

## 2020-10-15 ENCOUNTER — Other Ambulatory Visit: Payer: Self-pay

## 2020-10-15 ENCOUNTER — Encounter: Payer: Self-pay | Admitting: Family Medicine

## 2020-10-15 ENCOUNTER — Ambulatory Visit (INDEPENDENT_AMBULATORY_CARE_PROVIDER_SITE_OTHER): Payer: Medicare Other | Admitting: Family Medicine

## 2020-10-15 VITALS — BP 159/77 | HR 68 | Temp 97.9°F | Ht 60.0 in | Wt 205.0 lb

## 2020-10-15 DIAGNOSIS — E1142 Type 2 diabetes mellitus with diabetic polyneuropathy: Secondary | ICD-10-CM | POA: Diagnosis not present

## 2020-10-15 DIAGNOSIS — F411 Generalized anxiety disorder: Secondary | ICD-10-CM | POA: Diagnosis not present

## 2020-10-15 DIAGNOSIS — L84 Corns and callosities: Secondary | ICD-10-CM | POA: Diagnosis not present

## 2020-10-15 DIAGNOSIS — E1169 Type 2 diabetes mellitus with other specified complication: Secondary | ICD-10-CM

## 2020-10-15 DIAGNOSIS — E1165 Type 2 diabetes mellitus with hyperglycemia: Secondary | ICD-10-CM | POA: Diagnosis not present

## 2020-10-15 DIAGNOSIS — E785 Hyperlipidemia, unspecified: Secondary | ICD-10-CM | POA: Diagnosis not present

## 2020-10-15 DIAGNOSIS — Z130 Encounter for screening for diseases of the blood and blood-forming organs and certain disorders involving the immune mechanism: Secondary | ICD-10-CM

## 2020-10-15 DIAGNOSIS — F329 Major depressive disorder, single episode, unspecified: Secondary | ICD-10-CM

## 2020-10-15 DIAGNOSIS — F410 Panic disorder [episodic paroxysmal anxiety] without agoraphobia: Secondary | ICD-10-CM

## 2020-10-15 DIAGNOSIS — F32A Depression, unspecified: Secondary | ICD-10-CM

## 2020-10-15 LAB — LIPID PANEL
Chol/HDL Ratio: 4.2 ratio (ref 0.0–4.4)
Cholesterol, Total: 123 mg/dL (ref 100–199)
HDL: 29 mg/dL — ABNORMAL LOW (ref >39)
LDL Chol Calc (NIH): 63 mg/dL (ref 0–99)
Triglycerides: 184 mg/dL — ABNORMAL HIGH (ref 0–149)
VLDL Cholesterol Cal: 31 mg/dL (ref 5–40)

## 2020-10-15 LAB — CMP14+EGFR
ALT: 19 IU/L (ref 0–32)
AST: 16 IU/L (ref 0–40)
Albumin/Globulin Ratio: 1.4 (ref 1.2–2.2)
Albumin: 4 g/dL (ref 3.8–4.8)
Alkaline Phosphatase: 94 IU/L (ref 44–121)
BUN/Creatinine Ratio: 11 — ABNORMAL LOW (ref 12–28)
BUN: 10 mg/dL (ref 8–27)
Bilirubin Total: 0.3 mg/dL (ref 0.0–1.2)
CO2: 22 mmol/L (ref 20–29)
Calcium: 9.2 mg/dL (ref 8.7–10.3)
Chloride: 99 mmol/L (ref 96–106)
Creatinine, Ser: 0.94 mg/dL (ref 0.57–1.00)
Globulin, Total: 2.9 g/dL (ref 1.5–4.5)
Glucose: 401 mg/dL (ref 65–99)
Potassium: 2.9 mmol/L — ABNORMAL LOW (ref 3.5–5.2)
Sodium: 141 mmol/L (ref 134–144)
Total Protein: 6.9 g/dL (ref 6.0–8.5)
eGFR: 66 mL/min/{1.73_m2} (ref >59)

## 2020-10-15 LAB — BAYER DCA HB A1C WAIVED: HB A1C (BAYER DCA - WAIVED): 9.5 % — ABNORMAL HIGH (ref <7.0)

## 2020-10-15 LAB — CBC
Hematocrit: 45.4 % (ref 34.0–46.6)
Hemoglobin: 14.7 g/dL (ref 11.1–15.9)
MCH: 28.5 pg (ref 26.6–33.0)
MCHC: 32.4 g/dL (ref 31.5–35.7)
MCV: 88 fL (ref 79–97)
Platelets: 269 10*3/uL (ref 150–450)
RBC: 5.16 x10E6/uL (ref 3.77–5.28)
RDW: 12.6 % (ref 11.7–15.4)
WBC: 8.8 10*3/uL (ref 3.4–10.8)

## 2020-10-15 MED ORDER — ALPHA-LIPOIC ACID 600 MG PO CAPS: 1.0000 | ORAL_CAPSULE | Freq: Every day | ORAL | 3 refills | Status: DC

## 2020-10-15 MED ORDER — BUPROPION HCL ER (XL) 300 MG PO TB24: 300.0000 mg | ORAL_TABLET | Freq: Every day | ORAL | 0 refills | Status: DC

## 2020-10-15 MED ORDER — RYBELSUS 3 MG PO TABS: 1.0000 | ORAL_TABLET | Freq: Every day | ORAL | 0 refills | Status: DC

## 2020-10-15 NOTE — Progress Notes (Signed)
Subjective: CC: Uncontorlled DM PCP: [name], DO FHL:[id number] A Pelphrey is a 70 y.o. female presenting to clinic today for:  1. Type 2 Diabetes with hypertension, hyperlipidemia:  Compliant with her Jardiance and glipizide.  Admits that she has not been maintaining a strict diet but she feels that her depression has been worse and therefore emotionally eats.  Does not report any chest pain, shortness of breath.  She is compliant with her Norvasc, Cozaar and Zocor.  She has ongoing neuropathy of the feet.  Never did get her diabetic shoes but would like a prescription sent to [facility].  No history of foot ulcer.  No current foot ulcers.  She does have chronic foot pain despite 1200 mg of gabapentin 3 times daily..  Last eye exam: Up-to-date Last foot exam: Needs Last A1c:  Lab Results  Component Value Date   HGBA1C 8.1 (H) 02/12/2020   Nephropathy screen indicated?:  On ARB Last flu, zoster and/or pneumovax:  Immunization History  Administered Date(s) Administered  . Fluad Quad(high Dose 65+) 05/03/2019  . Influenza,inj,Quad PF,6+ Mos 05/25/2013, 07/31/2015, 06/18/2016, 05/16/2018  . Influenza-Unspecified 05/07/2019, 07/24/2020  . PPD Test 05/08/2013  . Pneumococcal Conjugate-13 05/16/2018  . Pneumococcal Polysaccharide-23 07/03/2019  . Tdap 12/28/2018  . Zoster Recombinat (Shingrix) 06/06/2019, 06/30/2019, 10/27/2019    2.  Depressive disorder Patient with ongoing depression.  She is on max dose Celexa.  She is taking BuSpar and Wellbutrin.  Would like to advance dose of Wellbutrin.  She reports poor sleep despite Ambien use as needed.  She has found the counseling sessions with Scott to be helpful.   ROS: Per HPI  Allergies  Allergen Reactions  . Ciprofloxacin Hcl Rash   Past Medical History:  Diagnosis Date  . Allergy    seasonal allergies  . Anemia    hx of  . Arthritis    rheumatoid  . Depression    on meds  . Diabetes ([place])   . Diabetic  peripheral neuropathy ([place])   . GERD (gastroesophageal reflux disease)    on meds  . Headache   . Hyperlipidemia    on meds  . Hyperplastic rectal polyp 10/21/2015  . Hypertension    pt. denies  . Lymphocytic colitis   . Neuropathy   . Rheumatoid arthritis ([place])   . Shortness of breath dyspnea    with exertion    Current Outpatient Medications:  .  amLODipine (NORVASC) 5 MG tablet, TAKE 1 TABLET BY MOUTH DAILY., Disp: 90 tablet, Rfl: 0 .  aspirin 81 MG tablet, Take 81 mg by mouth daily., Disp: , Rfl:  .  atenolol (TENORMIN) 25 MG tablet, TAKE 1 TABLET BY MOUTH DAILY., Disp: 90 tablet, Rfl: 0 .  azelastine (OPTIVAR) 0.05 % ophthalmic solution, in the morning and at bedtime., Disp: , Rfl:  .  buPROPion (WELLBUTRIN XL) 150 MG 24 hr tablet, Take 1 tablet (150 mg total) by mouth daily. (Needs to be seen before next refill), Disp: 30 tablet, Rfl: 0 .  busPIRone (BUSPAR) 10 MG tablet, TAKE  (1)  TABLET TWICE A DAY., Disp: 60 tablet, Rfl: 2 .  cholecalciferol (VITAMIN D) 1000 UNITS tablet, Take 1,000 Units by mouth daily., Disp: , Rfl:  .  cholestyramine light (PREVALITE) 4 g packet, Take 1 packet (4 g total) by mouth at bedtime., Disp: 90 packet, Rfl: 1 .  citalopram (CELEXA) 40 MG tablet, TAKE 1 TABLET EVERY DAY (Needs to be seen), Disp: 90 tablet, Rfl: 0 .  Cyanocobalamin (VITAMIN B 12 PO), Take by mouth., Disp: , Rfl:  .  diclofenac (VOLTAREN) 75 MG EC tablet, TAKE 1 TABLET 2 TIMES A DAY AS NEEDED FOR MODERATE PAIN (use sparingly)., Disp: 30 tablet, Rfl: 0 .  diphenoxylate-atropine (LOMOTIL) 2.5-0.025 MG tablet, TAKE 1 OR 2 TABLETS FOUR TIMES DAILY AS NEEDED, Disp: 90 tablet, Rfl: 1 .  empagliflozin (JARDIANCE) 25 MG TABS tablet, Take 1 tablet (25 mg total) by mouth daily before breakfast., Disp: 30 tablet, Rfl: 2 .  gabapentin (NEURONTIN) 600 MG tablet, Take 2 tablets (1,200 mg total) by mouth 3 (three) times daily., Disp: 180 tablet, Rfl: 0 .  glipiZIDE (GLUCOTROL) 10 MG tablet, TAKE (1)  TABLET TWICE A DAY BEFORE MEALS., Disp: 180 tablet, Rfl: 0 .  HYDROcodone-acetaminophen (NORCO/VICODIN) 5-325 MG per tablet, Take 1 tablet by mouth every 6 (six) hours as needed., Disp: 60 tablet, Rfl: 0 .  losartan (COZAAR) 100 MG tablet, TAKE 1 TABLET DAILY, Disp: 90 tablet, Rfl: 0 .  omeprazole (PRILOSEC) 20 MG capsule, Take 1 capsule (20 mg total) by mouth daily. Please cancel protonix, Disp: 90 capsule, Rfl: 3 .  Pyridoxine HCl (VITAMIN B-6 PO), Take by mouth., Disp: , Rfl:  .  RESTASIS 0.05 % ophthalmic emulsion, in the morning and at bedtime. , Disp: , Rfl:  .  simvastatin (ZOCOR) 40 MG tablet, TAKE 1 TABLET DAILY, Disp: 90 tablet, Rfl: 0 .  SULFASALAZINE PO, Take by mouth., Disp: , Rfl:  .  vitamin C (ASCORBIC ACID) 500 MG tablet, Take 500 mg by mouth daily. , Disp: , Rfl:  .  zolpidem (AMBIEN) 5 MG tablet, Take 1 tablet (5 mg total) by mouth at bedtime as needed for sleep (use sparingly)., Disp: 20 tablet, Rfl: 1 Social History   Socioeconomic History  . Marital status: Divorced    Spouse name: Not on file  . Number of children: 1  . Years of education: 11th  . Highest education level: 11th grade  Occupational History  . Occupation: retired  Tobacco Use  . Smoking status: Current Every Day Smoker    Packs/day: 1.00    Years: 46.00    Pack years: 46.00    Types: Cigarettes  . Smokeless tobacco: Never Used  Vaping Use  . Vaping Use: Some days  Substance and Sexual Activity  . Alcohol use: No    Alcohol/week: 0.0 standard drinks  . Drug use: No  . Sexual activity: Not Currently  Other Topics Concern  . Not on file  Social History Narrative   The patient is divorced she has 1 sonWho is married that she has a grandchild.   She does not use alcohol or drugs she drinks 3-4 caffeinated beverages daily   She is a smoker   Social Determinants of Radio broadcast assistant Strain: Not on file  Food Insecurity: Not on file  Transportation Needs: Not on file  Physical  Activity: Not on file  Stress: Not on file  Social Connections: Not on file  Intimate Partner Violence: Not on file   Family History  Problem Relation Age of Onset  . Alzheimer's disease Mother   . Lung cancer Mother 2  . Diabetes Father   . Heart disease Father   . Arthritis Sister   . Diabetes Sister   . Diabetes Brother   . Hypertension Brother   . Colon cancer Neg Hx   . Stomach cancer Neg Hx   . Colon polyps Neg Hx   . Esophageal  cancer Neg Hx   . Rectal cancer Neg Hx     Objective: Office vital signs reviewed. BP (!) 159/77   Pulse 68   Temp 97.9 F (36.6 C) (Temporal)   Ht 5' (1.524 m)   Wt 205 lb (93 kg)   SpO2 91%   BMI 40.04 kg/m   Physical Examination:  General: Awake, alert, No acute distress HEENT: Normal, sclera white Cardio: regular rate and rhythm, S1S2 heard, no murmurs appreciated Pulm: clear to auscultation bilaterally, no wheezes, rhonchi or rales; normal work of breathing on room air Extremities: warm, well perfused, No edema, cyanosis or clubbing; +2 pulses bilaterally MSK: slow gait and normal station Skin: dry; intact; no rashes or lesions Neuro:see DM foot Diabetic Foot Exam - Simple   Simple Foot Form Diabetic Foot exam was performed with the following findings: Yes 10/15/2020  1:12 PM  Visual Inspection See comments: Yes Sensation Testing See comments: Yes Pulse Check Posterior Tibialis and Dorsalis pulse intact bilaterally: Yes Comments Preulcerative callus formation noted on the plantar aspects of bilateral feet.  She has decreased monofilament sensation throughout bilateral feet     Assessment/ Plan: 70 y.o. female   Uncontrolled type 2 diabetes mellitus with hyperglycemia ([place]) - Plan: Bayer DCA Hb A1c Waived  Hyperlipidemia associated with type 2 diabetes mellitus ([place]) - Plan: CMP14+EGFR, Lipid panel  Screening, anemia, deficiency, iron - Plan: CBC  Generalized anxiety disorder with panic attacks  Depressive disorder  - Plan: buPROPion (WELLBUTRIN XL) 300 MG 24 hr tablet  Diabetic polyneuropathy associated with type 2 diabetes mellitus ([place]) - Plan: For Home Use Only DME Diabetic Shoe  Pre-ulcerative calluses - Plan: For Home Use Only DME Diabetic Shoe  Sugar is rising.  Her A1c was up to 9.5 today.  I am adding Rybelsus 3 mg daily.  Structured for use discussed.  30-day sample provided.  Continue Jardiance and glipizide.  We will reconvene in 4 weeks to review blood sugar log.  Reinforced need for carb restriction, increase physical exercise  Continue statin  Blood pressure is not well controlled.  Will reevaluate in 4 weeks.  If persistently elevated, plan to advance Norvasc to 10 mg daily  Anxiety disorder stable but depressive disorder exacerbated.  Increase Wellbutrin to 300 mg  Diabetic shoes ordered.  No orders of the defined types were placed in this encounter.  No orders of the defined types were placed in this encounter.    [name], DO [place] [phone]

## 2020-10-15 NOTE — Patient Instructions (Signed)
Taking the medicine as directed and not missing any doses is one of the best things you can do to treat your depression.  Here are some things to keep in mind:  1) Side effects (stomach upset, some increased anxiety) may happen before you notice a benefit.  These side effects typically go away over time. 2) Changes to your dose of medicine or a change in medication all together is sometimes necessary 3) Most people need to be on medication at least 12 months 4) Many people will notice an improvement within two weeks but the full effect of the medication can take up to 4-6 weeks 5) Stopping the medication when you start feeling better often results in a return of symptoms 6) Never discontinue your medication without contacting a health care professional first.  Some medications require gradual discontinuation/ taper and can make you sick if you stop them abruptly.  If your symptoms worsen or you have thoughts of suicide/homicide, PLEASE SEEK IMMEDIATE MEDICAL ATTENTION.  You may always call:  National Suicide Hotline: 800-273-8255 [place] Crisis Line: 336-832-9700 Crisis Recovery in [place]: 800-939-5911   These are available 24 hours a day, 7 days a week.   

## 2020-10-16 ENCOUNTER — Other Ambulatory Visit: Payer: Self-pay | Admitting: Family Medicine

## 2020-10-16 DIAGNOSIS — F5104 Psychophysiologic insomnia: Secondary | ICD-10-CM

## 2020-10-22 ENCOUNTER — Ambulatory Visit: Payer: PRIVATE HEALTH INSURANCE | Admitting: Internal Medicine

## 2020-10-22 ENCOUNTER — Telehealth: Payer: Self-pay | Admitting: Internal Medicine

## 2020-10-24 ENCOUNTER — Telehealth: Payer: Self-pay

## 2020-10-24 NOTE — Telephone Encounter (Signed)
Pt says that [place] has not received order for DME shoes. Please fax over again

## 2020-10-24 NOTE — Telephone Encounter (Signed)
Printed order for DME Diabetic Shoes and faxed to [employer] and ttc pt but no answer and VM full.

## 2020-11-01 ENCOUNTER — Telehealth: Payer: Medicare Other

## 2020-11-04 ENCOUNTER — Other Ambulatory Visit: Payer: Self-pay | Admitting: Family Medicine

## 2020-11-13 ENCOUNTER — Other Ambulatory Visit: Payer: Self-pay | Admitting: Family Medicine

## 2020-11-13 ENCOUNTER — Other Ambulatory Visit: Payer: Self-pay | Admitting: Gastroenterology

## 2020-11-13 DIAGNOSIS — G629 Polyneuropathy, unspecified: Secondary | ICD-10-CM

## 2020-11-18 ENCOUNTER — Ambulatory Visit: Payer: Medicare Other

## 2020-11-18 ENCOUNTER — Ambulatory Visit (INDEPENDENT_AMBULATORY_CARE_PROVIDER_SITE_OTHER): Payer: Medicare Other | Admitting: Family Medicine

## 2020-11-18 ENCOUNTER — Other Ambulatory Visit: Payer: Self-pay | Admitting: Family Medicine

## 2020-11-18 DIAGNOSIS — E1165 Type 2 diabetes mellitus with hyperglycemia: Secondary | ICD-10-CM

## 2020-11-18 DIAGNOSIS — J029 Acute pharyngitis, unspecified: Secondary | ICD-10-CM | POA: Diagnosis not present

## 2020-11-18 DIAGNOSIS — R6889 Other general symptoms and signs: Secondary | ICD-10-CM

## 2020-11-18 LAB — CULTURE, GROUP A STREP

## 2020-11-18 LAB — RAPID STREP SCREEN (MED CTR MEBANE ONLY): Strep Gp A Ag, IA W/Reflex: NEGATIVE

## 2020-11-18 LAB — VERITOR FLU A/B WAIVED
Influenza A: NEGATIVE
Influenza B: NEGATIVE

## 2020-11-18 MED ORDER — MAGIC MOUTHWASH W/LIDOCAINE: ORAL | 0 refills | Status: DC

## 2020-11-18 MED ORDER — RYBELSUS 7 MG PO TABS: 7.0000 mg | ORAL_TABLET | Freq: Every day | ORAL | 0 refills | Status: DC

## 2020-11-18 MED ORDER — RYBELSUS 14 MG PO TABS: 14.0000 mg | ORAL_TABLET | Freq: Every day | ORAL | 0 refills | Status: DC

## 2020-11-18 NOTE — Patient Instructions (Signed)

## 2020-11-18 NOTE — Progress Notes (Signed)
Telephone visit  Subjective: CC: flu like symptoms PCP: [name], DO GUY:[id number] Donna Rogers is Donna 70 y.o. female calls for telephone consult today. Patient provides verbal consent for consult held via phone.  Due to COVID-19 pandemic this visit was conducted virtually. This visit type was conducted due to national recommendations for restrictions regarding the COVID-19 Pandemic (e.g. social distancing, sheltering in place) in an effort to limit this patient's exposure and mitigate transmission in our community. All issues noted in this document were discussed and addressed.  Donna physical exam was not performed with this format.   Location of patient: home Location of provider: [facility] Others present for call: none  1. Flu like symptoms She reports sudden onset of myalgia, headache and sore throat that onset yesterday.  No known sick contacts.  She is using alkaseltzer plus.  No fevers, cough, diarrhea.  ROS: Per HPI  Allergies  Allergen Reactions  . Ciprofloxacin Hcl Rash   Past Medical History:  Diagnosis Date  . Allergy    seasonal allergies  . Anemia    hx of  . Arthritis    rheumatoid  . Depression    on meds  . Diabetes ([place])   . Diabetic peripheral neuropathy ([place])   . GERD (gastroesophageal reflux disease)    on meds  . Headache   . Hyperlipidemia    on meds  . Hyperplastic rectal polyp 10/21/2015  . Hypertension    pt. denies  . Lymphocytic colitis   . Neuropathy   . Rheumatoid arthritis ([place])   . Shortness of breath dyspnea    with exertion    Current Outpatient Medications:  .  Alpha-Lipoic Acid 600 MG CAPS, Take 1 capsule (600 mg total) by mouth daily. For neuropathy, Disp: 90 capsule, Rfl: 3 .  amLODipine (NORVASC) 5 MG tablet, TAKE 1 TABLET BY MOUTH DAILY., Disp: 90 tablet, Rfl: 0 .  aspirin 81 MG tablet, Take 81 mg by mouth daily., Disp: , Rfl:  .  atenolol (TENORMIN) 25 MG tablet, TAKE 1 TABLET BY MOUTH DAILY., Disp: 90 tablet, Rfl: 0 .   azelastine (OPTIVAR) 0.05 % ophthalmic solution, in the morning and at bedtime., Disp: , Rfl:  .  budesonide (ENTOCORT EC) 3 MG 24 hr capsule, TAKE 3 CAPSULES ONCE DAILY, Disp: 90 capsule, Rfl: 2 .  buPROPion (WELLBUTRIN XL) 300 MG 24 hr tablet, Take 1 tablet (300 mg total) by mouth daily., Disp: 90 tablet, Rfl: 0 .  busPIRone (BUSPAR) 10 MG tablet, TAKE  (1)  TABLET TWICE Donna DAY., Disp: 60 tablet, Rfl: 2 .  cholecalciferol (VITAMIN D) 1000 UNITS tablet, Take 1,000 Units by mouth daily., Disp: , Rfl:  .  cholestyramine light (PREVALITE) 4 g packet, Take 1 packet (4 g total) by mouth at bedtime., Disp: 90 packet, Rfl: 1 .  citalopram (CELEXA) 40 MG tablet, TAKE 1 TABLET EVERY DAY (Needs to be seen), Disp: 90 tablet, Rfl: 0 .  Cyanocobalamin (VITAMIN B 12 PO), Take by mouth., Disp: , Rfl:  .  diclofenac (VOLTAREN) 75 MG EC tablet, TAKE 1 TABLET 2 TIMES Donna DAY AS NEEDED FOR MODERATE PAIN (use sparingly)., Disp: 30 tablet, Rfl: 0 .  diphenoxylate-atropine (LOMOTIL) 2.5-0.025 MG tablet, TAKE 1 OR 2 TABLETS FOUR TIMES DAILY AS NEEDED, Disp: 90 tablet, Rfl: 1 .  empagliflozin (JARDIANCE) 25 MG TABS tablet, Take 1 tablet (25 mg total) by mouth daily before breakfast., Disp: 30 tablet, Rfl: 2 .  gabapentin (NEURONTIN) 600 MG tablet, TAKE (2)  TABLETS THREE TIMES DAILY., Disp: 180 tablet, Rfl: 0 .  glipiZIDE (GLUCOTROL) 10 MG tablet, TAKE (1) TABLET TWICE Donna DAY BEFORE MEALS., Disp: 180 tablet, Rfl: 0 .  HYDROcodone-acetaminophen (NORCO/VICODIN) 5-325 MG per tablet, Take 1 tablet by mouth every 6 (six) hours as needed., Disp: 60 tablet, Rfl: 0 .  losartan (COZAAR) 100 MG tablet, TAKE 1 TABLET DAILY, Disp: 90 tablet, Rfl: 0 .  omeprazole (PRILOSEC) 20 MG capsule, Take 1 capsule (20 mg total) by mouth daily. Please cancel protonix, Disp: 90 capsule, Rfl: 3 .  Pyridoxine HCl (VITAMIN B-6 PO), Take by mouth., Disp: , Rfl:  .  RESTASIS 0.05 % ophthalmic emulsion, in the morning and at bedtime. , Disp: , Rfl:  .   Semaglutide (RYBELSUS) 3 MG TABS, Take 1 tablet by mouth daily before breakfast., Disp: 30 tablet, Rfl: 0 .  simvastatin (ZOCOR) 40 MG tablet, TAKE 1 TABLET DAILY, Disp: 90 tablet, Rfl: 0 .  SULFASALAZINE PO, Take by mouth., Disp: , Rfl:  .  vitamin C (ASCORBIC ACID) 500 MG tablet, Take 500 mg by mouth daily. , Disp: , Rfl:  .  zolpidem (AMBIEN) 5 MG tablet, Take 1 tablet at bedtime as needed for sleep (use sparingly)., Disp: 20 tablet, Rfl: 1  Assessment/ Plan: 70 y.o. female   Flu-like symptoms - Plan: Veritor Flu Donna/B Waived, Novel Coronavirus, NAA ([facility])  Sore throat - Plan: Rapid Strep Screen ([facility] ONLY), Culture, Group Donna Strep, magic mouthwash w/lidocaine SOLN  Uncontrolled type 2 diabetes mellitus with hyperglycemia (HCC) - Plan: Semaglutide (RYBELSUS) 7 MG TABS, Semaglutide (RYBELSUS) 14 MG TABS  Tylenol recommended for myalgias.  Check flu, Covid, strep.  Tolerated Rybelsus 3 mg without difficulty.  BG's are coming down to the 180s.  Advised to advance Rybelsus to 7 mg now that she is completed the 3 mg tablets.  She has Donna follow-up with me in 1 month.  Start time: 12:21pm End time: 12:26pm  Total time spent on patient care (including telephone call/ virtual visit): 5 minutes  [place], [place] [phone]

## 2020-11-19 LAB — SARS-COV-2, NAA 2 DAY TAT

## 2020-11-19 LAB — NOVEL CORONAVIRUS, NAA: SARS-CoV-2, NAA: NOT DETECTED

## 2020-11-26 DIAGNOSIS — L84 Corns and callosities: Secondary | ICD-10-CM | POA: Diagnosis not present

## 2020-11-26 DIAGNOSIS — E119 Type 2 diabetes mellitus without complications: Secondary | ICD-10-CM | POA: Diagnosis not present

## 2020-12-02 ENCOUNTER — Telehealth: Payer: Medicare Other

## 2020-12-04 DIAGNOSIS — M150 Primary generalized (osteo)arthritis: Secondary | ICD-10-CM | POA: Diagnosis not present

## 2020-12-04 DIAGNOSIS — R5382 Chronic fatigue, unspecified: Secondary | ICD-10-CM | POA: Diagnosis not present

## 2020-12-04 DIAGNOSIS — K52832 Lymphocytic colitis: Secondary | ICD-10-CM | POA: Diagnosis not present

## 2020-12-04 DIAGNOSIS — M5136 Other intervertebral disc degeneration, lumbar region: Secondary | ICD-10-CM | POA: Diagnosis not present

## 2020-12-04 DIAGNOSIS — M8580 Other specified disorders of bone density and structure, unspecified site: Secondary | ICD-10-CM | POA: Diagnosis not present

## 2020-12-04 DIAGNOSIS — M0579 Rheumatoid arthritis with rheumatoid factor of multiple sites without organ or systems involvement: Secondary | ICD-10-CM | POA: Diagnosis not present

## 2020-12-05 ENCOUNTER — Ambulatory Visit (INDEPENDENT_AMBULATORY_CARE_PROVIDER_SITE_OTHER): Payer: Medicare Other | Admitting: Family

## 2020-12-05 ENCOUNTER — Encounter: Payer: Self-pay | Admitting: Family

## 2020-12-05 ENCOUNTER — Other Ambulatory Visit: Payer: Self-pay

## 2020-12-05 DIAGNOSIS — B9689 Other specified bacterial agents as the cause of diseases classified elsewhere: Secondary | ICD-10-CM | POA: Diagnosis not present

## 2020-12-05 DIAGNOSIS — Z1231 Encounter for screening mammogram for malignant neoplasm of breast: Secondary | ICD-10-CM

## 2020-12-05 DIAGNOSIS — J208 Acute bronchitis due to other specified organisms: Secondary | ICD-10-CM | POA: Diagnosis not present

## 2020-12-05 MED ORDER — AZITHROMYCIN 250 MG PO TABS: ORAL_TABLET | ORAL | 0 refills | Status: DC

## 2020-12-05 NOTE — Progress Notes (Signed)
   Virtual Visit  Note Due to COVID-19 pandemic this visit was conducted virtually. This visit type was conducted due to national recommendations for restrictions regarding the COVID-19 Pandemic (e.g. social distancing, sheltering in place) in an effort to limit this patient's exposure and mitigate transmission in our community. All issues noted in this document were discussed and addressed.  A physical exam was not performed with this format.  I connected with Donna Rogers on 12/05/20 at 12:23 pm  by telephone and verified that I am speaking with the correct person using two identifiers. Donna Rogers is currently located at home  and no oone is currently with her during visit. The provider, Evelina Dun, FNP is located in their office at time of visit.  I discussed the limitations, risks, security and privacy concerns of performing an evaluation and management service by telephone and the availability of in person appointments. I also discussed with the patient that there may be a patient responsible charge related to this service. The patient expressed understanding and agreed to proceed.   History and Present Illness:  Cough This is a new problem. The current episode started 1 to 4 weeks ago. The problem has been gradually worsening. The cough is productive of purulent sputum. Associated symptoms include headaches, nasal congestion, postnasal drip and a sore throat. Pertinent negatives include no chills, ear congestion, ear pain, fever, myalgias, shortness of breath or wheezing. She has tried rest (gargle ) for the symptoms. The treatment provided mild relief.      Review of Systems  Constitutional: Negative for chills and fever.  HENT: Positive for postnasal drip and sore throat. Negative for ear pain.   Respiratory: Positive for cough. Negative for shortness of breath and wheezing.   Musculoskeletal: Negative for myalgias.  Neurological: Positive for headaches.      Observations/Objective: No SOB or distress noted, hoarse voice.   Assessment and Plan: 1. Acute bacterial bronchitis - Take meds as prescribed - Use a cool mist humidifier  -Use saline nose sprays frequently -Force fluids -For any cough or congestion  Use plain Mucinex- regular strength or max strength is fine -For fever or aces or pains- take tylenol or ibuprofen. -Throat lozenges if help -RTO if symptoms worsen or do not improve  - azithromycin (ZITHROMAX) 250 MG tablet; Take 500 mg once, then 250 mg for four days  Dispense: 6 tablet; Refill: 0     I discussed the assessment and treatment plan with the patient. The patient was provided an opportunity to ask questions and all were answered. The patient agreed with the plan and demonstrated an understanding of the instructions.   The patient was advised to call back or seek an in-person evaluation if the symptoms worsen or if the condition fails to improve as anticipated.  The above assessment and management plan was discussed with the patient. The patient verbalized understanding of and has agreed to the management plan. Patient is aware to call the clinic if symptoms persist or worsen. Patient is aware when to return to the clinic for a follow-up visit. Patient educated on when it is appropriate to go to the emergency department.   Time call ended:  12:34 pm   I provided 11 minutes of  non face-to-face time during this encounter.    Evelina Dun, FNP

## 2020-12-12 ENCOUNTER — Other Ambulatory Visit: Payer: Self-pay | Admitting: Family Medicine

## 2020-12-17 ENCOUNTER — Other Ambulatory Visit: Payer: Self-pay | Admitting: Family Medicine

## 2020-12-17 DIAGNOSIS — E1165 Type 2 diabetes mellitus with hyperglycemia: Secondary | ICD-10-CM

## 2020-12-20 ENCOUNTER — Other Ambulatory Visit: Payer: Self-pay

## 2020-12-20 ENCOUNTER — Telehealth: Payer: Self-pay

## 2020-12-20 ENCOUNTER — Encounter: Payer: Self-pay | Admitting: Family Medicine

## 2020-12-20 ENCOUNTER — Ambulatory Visit (INDEPENDENT_AMBULATORY_CARE_PROVIDER_SITE_OTHER): Payer: Medicare Other | Admitting: Family Medicine

## 2020-12-20 VITALS — BP 133/82 | HR 82 | Temp 97.9°F | Ht 60.0 in | Wt 196.6 lb

## 2020-12-20 DIAGNOSIS — F410 Panic disorder [episodic paroxysmal anxiety] without agoraphobia: Secondary | ICD-10-CM | POA: Diagnosis not present

## 2020-12-20 DIAGNOSIS — E1165 Type 2 diabetes mellitus with hyperglycemia: Secondary | ICD-10-CM | POA: Diagnosis not present

## 2020-12-20 DIAGNOSIS — F329 Major depressive disorder, single episode, unspecified: Secondary | ICD-10-CM

## 2020-12-20 DIAGNOSIS — F411 Generalized anxiety disorder: Secondary | ICD-10-CM | POA: Diagnosis not present

## 2020-12-20 DIAGNOSIS — F32A Depression, unspecified: Secondary | ICD-10-CM

## 2020-12-20 NOTE — Progress Notes (Signed)
Subjective: CC: Depression f/u PCP: [name], DO OHY:[id number] Donna Rogers is Donna 70 y.o. female presenting to clinic today for:  1. Depression Patient on max dose Celexa.  Her Wellbutrin was Donna visit 300 mg daily.  She was continued on buspirone 10 mg twice daily.  She is here for follow-up.  She continues to have symptoms despite increase in Wellbutrin.  She reports decreased energy, low motivation, isolating behaviors.  She is tearful because she does not want to be depressed.  She has had bilateral hip pain which exacerbates symptoms.  No SI or HI.  2.  Uncontrolled diabetes Patient reports that she has been out of her Jardiance and therefore has not been taking.  She notes that her blood sugars have run as high as 400s.  She is compliant with the rest of her medications.  She admits that she only drinks water and juice but was not aware that she has had sugar in it because it was natural.  She would be willing to see Donna diabetic nutritionist if 1 was available to her  ROS: Per HPI  Allergies  Allergen Reactions  . Ciprofloxacin Hcl Rash   Past Medical History:  Diagnosis Date  . Allergy    seasonal allergies  . Anemia    hx of  . Arthritis    rheumatoid  . Depression    on meds  . Diabetes ([place])   . Diabetic peripheral neuropathy ([place])   . GERD (gastroesophageal reflux disease)    on meds  . Headache   . Hyperlipidemia    on meds  . Hyperplastic rectal polyp 10/21/2015  . Hypertension    pt. denies  . Lymphocytic colitis   . Neuropathy   . Rheumatoid arthritis ([place])   . Shortness of breath dyspnea    with exertion    Current Outpatient Medications:  .  Alpha-Lipoic Acid 600 MG CAPS, Take 1 capsule (600 mg total) by mouth daily. For neuropathy, Disp: 90 capsule, Rfl: 3 .  amLODipine (NORVASC) 5 MG tablet, TAKE 1 TABLET BY MOUTH DAILY., Disp: 90 tablet, Rfl: 0 .  aspirin 81 MG tablet, Take 81 mg by mouth daily., Disp: , Rfl:  .  atenolol (TENORMIN) 25 MG  tablet, TAKE 1 TABLET BY MOUTH DAILY., Disp: 90 tablet, Rfl: 0 .  azelastine (OPTIVAR) 0.05 % ophthalmic solution, in the morning and at bedtime., Disp: , Rfl:  .  azithromycin (ZITHROMAX) 250 MG tablet, Take 500 mg once, then 250 mg for four days, Disp: 6 tablet, Rfl: 0 .  budesonide (ENTOCORT EC) 3 MG 24 hr capsule, TAKE 3 CAPSULES ONCE DAILY, Disp: 90 capsule, Rfl: 2 .  buPROPion (WELLBUTRIN XL) 300 MG 24 hr tablet, Take 1 tablet (300 mg total) by mouth daily., Disp: 90 tablet, Rfl: 0 .  busPIRone (BUSPAR) 10 MG tablet, TAKE  (1)  TABLET TWICE Donna DAY., Disp: 60 tablet, Rfl: 2 .  cholecalciferol (VITAMIN D) 1000 UNITS tablet, Take 1,000 Units by mouth daily., Disp: , Rfl:  .  cholestyramine light (PREVALITE) 4 g packet, Take 1 packet (4 g total) by mouth at bedtime., Disp: 90 packet, Rfl: 1 .  citalopram (CELEXA) 40 MG tablet, TAKE 1 TABLET EVERY DAY (Needs to be seen), Disp: 90 tablet, Rfl: 1 .  Cyanocobalamin (VITAMIN B 12 PO), Take by mouth., Disp: , Rfl:  .  diclofenac (VOLTAREN) 75 MG EC tablet, TAKE ONE TABLET BY MOUTH TWICE DAILY AS NEEDED FOR MODERATE PAIN (USE SPARINGLY),  Disp: 30 tablet, Rfl: 0 .  diphenoxylate-atropine (LOMOTIL) 2.5-0.025 MG tablet, TAKE 1 OR 2 TABLETS FOUR TIMES DAILY AS NEEDED, Disp: 90 tablet, Rfl: 1 .  empagliflozin (JARDIANCE) 25 MG TABS tablet, Take 1 tablet (25 mg total) by mouth daily before breakfast., Disp: 30 tablet, Rfl: 2 .  gabapentin (NEURONTIN) 600 MG tablet, TAKE (2) TABLETS THREE TIMES DAILY., Disp: 180 tablet, Rfl: 0 .  glipiZIDE (GLUCOTROL) 10 MG tablet, TAKE (1) TABLET TWICE Donna DAY BEFORE MEALS., Disp: 180 tablet, Rfl: 0 .  HYDROcodone-acetaminophen (NORCO/VICODIN) 5-325 MG per tablet, Take 1 tablet by mouth every 6 (six) hours as needed., Disp: 60 tablet, Rfl: 0 .  losartan (COZAAR) 100 MG tablet, TAKE 1 TABLET DAILY, Disp: 90 tablet, Rfl: 0 .  magic mouthwash w/lidocaine SOLN, Gargle and spit 66mL every 6 hours as needed for sore throat. 63mL  lidocaine, 79mL nystatin, 60mg  hydrocortisone tab, qs benadryl total 432mL., Disp: 480 mL, Rfl: 0 .  omeprazole (PRILOSEC) 20 MG capsule, Take 1 capsule (20 mg total) by mouth daily. Please cancel protonix, Disp: 90 capsule, Rfl: 3 .  Pyridoxine HCl (VITAMIN B-6 PO), Take by mouth., Disp: , Rfl:  .  RESTASIS 0.05 % ophthalmic emulsion, in the morning and at bedtime. , Disp: , Rfl:  .  Semaglutide (RYBELSUS) 14 MG TABS, Take 14 mg by mouth daily at 6 (six) AM. Start after completing 7mg  x1 month, Disp: 30 tablet, Rfl: 0 .  Semaglutide (RYBELSUS) 7 MG TABS, Take 7 mg by mouth daily., Disp: 30 tablet, Rfl: 0 .  simvastatin (ZOCOR) 40 MG tablet, TAKE 1 TABLET DAILY, Disp: 90 tablet, Rfl: 0 .  SULFASALAZINE PO, Take by mouth., Disp: , Rfl:  .  vitamin C (ASCORBIC ACID) 500 MG tablet, Take 500 mg by mouth daily. , Disp: , Rfl:  .  zolpidem (AMBIEN) 5 MG tablet, Take 1 tablet at bedtime as needed for sleep (use sparingly)., Disp: 20 tablet, Rfl: 1 Social History   Socioeconomic History  . Marital status: Divorced    Spouse name: Not on file  . Number of children: 1  . Years of education: 11th  . Highest education level: 11th grade  Occupational History  . Occupation: retired  Tobacco Use  . Smoking status: Current Every Day Smoker    Packs/day: 1.00    Years: 46.00    Pack years: 46.00    Types: Cigarettes  . Smokeless tobacco: Never Used  Vaping Use  . Vaping Use: Some days  Substance and Sexual Activity  . Alcohol use: No    Alcohol/week: 0.0 standard drinks  . Drug use: No  . Sexual activity: Not Currently  Other Topics Concern  . Not on file  Social History Narrative   The patient is divorced she has 1 sonWho is married that she has Donna grandchild.   She does not use alcohol or drugs she drinks 3-4 caffeinated beverages daily   She is Donna smoker   Social Determinants of Radio broadcast assistant Strain: Not on file  Food Insecurity: Not on file  Transportation Needs: Not on  file  Physical Activity: Not on file  Stress: Not on file  Social Connections: Not on file  Intimate Partner Violence: Not on file   Family History  Problem Relation Age of Onset  . Alzheimer's disease Mother   . Lung cancer Mother 27  . Diabetes Father   . Heart disease Father   . Arthritis Sister   . Diabetes Sister   .  Diabetes Brother   . Hypertension Brother   . Colon cancer Neg Hx   . Stomach cancer Neg Hx   . Colon polyps Neg Hx   . Esophageal cancer Neg Hx   . Rectal cancer Neg Hx     Objective: Office vital signs reviewed. BP 133/82   Pulse 82   Temp 97.9 F (36.6 C)   Ht 5' (1.524 m)   Wt 196 lb 9.6 oz (89.2 kg)   SpO2 97%   BMI 38.40 kg/m   Physical Examination:  General: Awake, alert, obese, No acute distress Psych: Tearful, depressed.  Patient is pleasant and interactive.  Does not appear to be responding to internal stimuli.  She has good eye contact  Depression screen [facility] 2/9 12/20/2020 10/15/2020 03/06/2020  Decreased Interest 3 1 1   Down, Depressed, Hopeless 1 3 1   PHQ - 2 Score 4 4 2   Altered sleeping 3 2 1   Tired, decreased energy 3 3 1   Change in appetite 1 1 0  Feeling bad or failure about yourself  1 1 0  Trouble concentrating 0 0 1  Moving slowly or fidgety/restless 1 0 1  Suicidal thoughts 0 0 0  PHQ-9 Score 13 11 6   Difficult doing work/chores Somewhat difficult Somewhat difficult Somewhat difficult  Some recent data might be hidden   GAD 7 : Generalized Anxiety Score 12/20/2020 10/15/2020 03/06/2020 02/12/2020  Nervous, Anxious, on Edge 1 0 0 1  Control/stop worrying 3 0 1 2  Worry too much - different things 3 0 0 2  Trouble relaxing 3 2 0 2  Restless 1 0 0 1  Easily annoyed or irritable 1 1 0 3  Afraid - awful might happen 3 0 0 0  Total GAD 7 Score 15 3 1 11   Anxiety Difficulty Somewhat difficult - Somewhat difficult Very difficult    Assessment/ Plan: 70 y.o. female   Generalized anxiety disorder with panic attacks  Depressive  disorder  Uncontrolled type 2 diabetes mellitus with hyperglycemia ([place]) - Plan: Ambulatory referral to diabetic education  I would like her to have [facility] testing.  We discussed options including switching medications and I was inclined to switch her to Cymbalta but would like to get her tested prior to initiation to ensure that this is Donna good selection in this patient.  In the meantime I have initiated Donna wean from the Celexa and she will take 1/2 tablet daily for the next 2 to 4 weeks and then we can drop her back to 10 mg daily.  I would also like her to reduce the Wellbutrin down to 150 mg daily since the 300 was essentially ineffective.  She should still have some leftover 150s but if not she will contact me and I will prescribe  With regards to her uncontrolled diabetes she was given samples of Jardiance today.  She will follow-up with Almyra Free for more assistance.  I have placed Donna referral to diabetic education and we reiterated today that juice in fact had quite Donna bit of sugar in it and she should not be consuming this.  Recommended sugar-free drinks and/or water  No orders of the defined types were placed in this encounter.  No orders of the defined types were placed in this encounter.    [name], DO [place] [phone]

## 2020-12-20 NOTE — Patient Instructions (Signed)
Cut Citalopram in half and only take 1/2 tablet daily.  We are going to wean you from this in anticipation of starting something new.  I'm going to decrease your Bupropion back as well to 150mg  daily since the 300mg  didn't do anything.  See Hendricks Limes, Nurse Practicioner, in 1 week for genetic testing.  This will help [place] pick the right anxiety/ depression medication to get you feeling better.  I'm thinking that Cymbalta may be a good fit but want to make sure it will work with your body.

## 2020-12-23 ENCOUNTER — Encounter: Payer: Self-pay | Admitting: Acute Care

## 2021-01-02 ENCOUNTER — Other Ambulatory Visit: Payer: Self-pay | Admitting: Family Medicine

## 2021-01-02 DIAGNOSIS — G629 Polyneuropathy, unspecified: Secondary | ICD-10-CM

## 2021-01-03 ENCOUNTER — Ambulatory Visit: Payer: Self-pay | Admitting: Family Medicine

## 2021-01-08 ENCOUNTER — Ambulatory Visit (INDEPENDENT_AMBULATORY_CARE_PROVIDER_SITE_OTHER): Payer: Medicare Other | Admitting: Licensed Clinical Social Worker

## 2021-01-08 DIAGNOSIS — F410 Panic disorder [episodic paroxysmal anxiety] without agoraphobia: Secondary | ICD-10-CM

## 2021-01-08 DIAGNOSIS — E1165 Type 2 diabetes mellitus with hyperglycemia: Secondary | ICD-10-CM | POA: Diagnosis not present

## 2021-01-08 DIAGNOSIS — E1169 Type 2 diabetes mellitus with other specified complication: Secondary | ICD-10-CM

## 2021-01-08 DIAGNOSIS — F32A Depression, unspecified: Secondary | ICD-10-CM

## 2021-01-08 DIAGNOSIS — E785 Hyperlipidemia, unspecified: Secondary | ICD-10-CM | POA: Diagnosis not present

## 2021-01-08 DIAGNOSIS — F329 Major depressive disorder, single episode, unspecified: Secondary | ICD-10-CM

## 2021-01-08 NOTE — Patient Instructions (Signed)
Visit Information  PATIENT GOALS: Goals Addressed            This Visit's Progress   . Manage My Emotions;Manage depression issues       Timeframe:  Short-Term Goal Priority:  High Progress: On Track Start Date:             01/08/21                Expected End Date:           04/10/21            Follow Up Date 02/13/21   Manage My Emotions (Patient)   Manage Depression Symptoms   Why is this important?    When you are stressed, down or upset, your body reacts too.   For example, your blood pressure may get higher; you may have a headache or stomachache.   When your emotions get the best of you, your body's ability to fight off cold and flu gets weak.   These steps will help you manage your emotions.    Patient Self Care Activities:  . Self administers medications as prescribed . Attends all scheduled provider appointments . Performs ADL's independently  Patient Coping Strengths:  . Family . Friends  Patient Self Care Deficits:  Marland Kitchen Depression issues . Anxiety issues  Patient Goals:  - spend time or talk with others at least 2 to 3 times per week - practice relaxation or meditation daily - keep a calendar with appointment dates  Follow Up Plan: [name] to call client on 02/13/21         Norva Riffle.Kristalynn Coddington MSW, [name] Licensed Clinical Social Worker [facility] Care Management [phone]

## 2021-01-08 NOTE — Chronic Care Management (AMB) (Signed)
Chronic Care Management    Clinical Social Work Note  01/08/2021 Name: Donna Rogers MRN: [id number] DOB: 03-10-1951  Donna Rogers is a 70 y.o. year old female who is a primary care patient of [name], DO. The CCM team was consulted to assist the patient with chronic disease management and/or care coordination needs related to: [employer] .   Engaged with patient by telephone for follow up visit in response to provider referral for social work chronic care management and care coordination services.   Consent to Services:  The patient was given information about Chronic Care Management services, agreed to services, and gave verbal consent prior to initiation of services.  Please see initial visit note for detailed documentation.   Patient agreed to services and consent obtained.   Assessment: Review of patient past medical history, allergies, medications, and health status, including review of relevant consultants reports was performed today as part of a comprehensive evaluation and provision of chronic care management and care coordination services.     SDOH (Social Determinants of Health) assessments and interventions performed:  SDOH Interventions   Flowsheet Row Most Recent Value  SDOH Interventions   Depression Interventions/Treatment  Currently on Treatment       Advanced Directives Status: See Vynca application for related entries.  CCM Care Plan  Allergies  Allergen Reactions  . Ciprofloxacin Hcl Rash    Outpatient Encounter Medications as of 01/08/2021  Medication Sig  . Alpha-Lipoic Acid 600 MG CAPS Take 1 capsule (600 mg total) by mouth daily. For neuropathy  . amLODipine (NORVASC) 5 MG tablet TAKE 1 TABLET BY MOUTH DAILY.  Marland Kitchen aspirin 81 MG tablet Take 81 mg by mouth daily.  Marland Kitchen atenolol (TENORMIN) 25 MG tablet TAKE 1 TABLET BY MOUTH DAILY.  Marland Kitchen azelastine (OPTIVAR) 0.05 % ophthalmic solution in the morning and at bedtime.  Marland Kitchen azithromycin  (ZITHROMAX) 250 MG tablet Take 500 mg once, then 250 mg for four days  . budesonide (ENTOCORT EC) 3 MG 24 hr capsule TAKE 3 CAPSULES ONCE DAILY  . buPROPion (WELLBUTRIN XL) 300 MG 24 hr tablet Take 1 tablet (300 mg total) by mouth daily.  . busPIRone (BUSPAR) 10 MG tablet TAKE  (1)  TABLET TWICE A DAY.  . cholecalciferol (VITAMIN D) 1000 UNITS tablet Take 1,000 Units by mouth daily.  . cholestyramine light (PREVALITE) 4 g packet Take 1 packet (4 g total) by mouth at bedtime.  . citalopram (CELEXA) 40 MG tablet TAKE 1 TABLET EVERY DAY (Needs to be seen)  . Cyanocobalamin (VITAMIN B 12 PO) Take by mouth.  . diclofenac (VOLTAREN) 75 MG EC tablet TAKE ONE TABLET BY MOUTH TWICE DAILY AS NEEDED FOR MODERATE PAIN (USE SPARINGLY)  . diphenoxylate-atropine (LOMOTIL) 2.5-0.025 MG tablet TAKE 1 OR 2 TABLETS FOUR TIMES DAILY AS NEEDED  . empagliflozin (JARDIANCE) 25 MG TABS tablet Take 1 tablet (25 mg total) by mouth daily before breakfast.  . gabapentin (NEURONTIN) 600 MG tablet TAKE TWO TABLETS BY MOUTH THREE TIMES DAILY  . glipiZIDE (GLUCOTROL) 10 MG tablet TAKE (1) TABLET TWICE A DAY BEFORE MEALS.  . HYDROcodone-acetaminophen (NORCO/VICODIN) 5-325 MG per tablet Take 1 tablet by mouth every 6 (six) hours as needed.  Marland Kitchen losartan (COZAAR) 100 MG tablet TAKE 1 TABLET DAILY  . magic mouthwash w/lidocaine SOLN Gargle and spit 45mL every 6 hours as needed for sore throat. 53mL lidocaine, 90mL nystatin, 60mg  hydrocortisone tab, qs benadryl total 458mL.  Marland Kitchen omeprazole (PRILOSEC) 20 MG capsule Take  1 capsule (20 mg total) by mouth daily. Please cancel protonix  . Pyridoxine HCl (VITAMIN B-6 PO) Take by mouth.  . RESTASIS 0.05 % ophthalmic emulsion in the morning and at bedtime.   . Semaglutide (RYBELSUS) 14 MG TABS Take 14 mg by mouth daily at 6 (six) AM. Start after completing 7mg  x1 month  . Semaglutide (RYBELSUS) 7 MG TABS Take 7 mg by mouth daily.  . simvastatin (ZOCOR) 40 MG tablet TAKE 1 TABLET DAILY  .  SULFASALAZINE PO Take by mouth.  . vitamin C (ASCORBIC ACID) 500 MG tablet Take 500 mg by mouth daily.   Marland Kitchen zolpidem (AMBIEN) 5 MG tablet Take 1 tablet at bedtime as needed for sleep (use sparingly).   No facility-administered encounter medications on file as of 01/08/2021.    Patient Active Problem List   Diagnosis Date Noted  . Dysphagia 03/27/2020  . Generalized anxiety disorder with panic attacks 09/01/2019  . Hyperlipidemia associated with type 2 diabetes mellitus ([place]) 12/28/2018  . Chronic pain of right knee 12/28/2018  . Tobacco abuse 04/10/2016  . Lymphocytic colitis 12/03/2015  . DM2 (diabetes mellitus, type 2) ([place]) 10/11/2015  . Depressive disorder 06/17/2015  . Fatigue 06/17/2015  . Weakness 06/03/2015  . Diarrhea 05/13/2015  . Hypertension associated with diabetes ([place])   . OBESITY 01/21/2009  . EROSIVE ESOPHAGITIS 01/21/2009  . GASTROESOPHAGEAL REFLUX DISEASE, CHRONIC 01/21/2009  . Rheumatoid arthritis ([place]) 01/21/2009  . TENDINITIS 01/21/2009  . EDEMA 01/21/2009    Conditions to be addressed/monitored: Monitor client management of depression and anxiety issues   Care Plan : [name] Care plan  Updates made by Katha Cabal, [name] since 01/08/2021 12:00 AM    Problem: Emotional Distress     Goal: Emotional Health Supported;Manage Depression symptoms   Start Date: 01/08/2021  Expected End Date: 04/10/2021  This Visit's Progress: On track  Priority: High  Note:   Current Barriers:  . Chronic Mental Health needs related to anxiety and depression . Challenges in managing Diabetes . Suicidal Ideation/Homicidal Ideation: No  Clinical Social Work Goal(s):  . patient will work with SW monthly by telephone or in person to reduce or manage symptoms related to anxiety and depression issues faced . patient will work with SW monthly to discuss self care, involvement in recreational activities, and client stress management strategies  Interventions: . 1:1 collaboration  with [name], DO regarding development and update of comprehensive plan of care as evidenced by provider attestation and co-signature . Talked with client about pain issues of client . Talked with client about mobility of client (has a walker to use as needed) . Talked with client about client dizziness . Talked with client about client mood (client said she worries about things; she said she has trouble sleeping; gets sad around time of Holidays) . Talked with client about self care activities . Talked with client about upcoming client medical appointments . Talked with client about appetite of client (decreased appetite) . Talked with client about relaxation techniques of client (client likes to watch TV, talks with friends via phone) . Talked with client about transport needs of client . Talked with client about vision of client . Provided counseling support for client . Encouraged client to talk with Dr. Lottie Dawson, [facility] pharmacist, about Diabetes medication needs of client . Collaborated with RNCM about client nursing needs  Patient Self Care Activities:  . Self administers medications as prescribed . Attends all scheduled provider appointments . Performs ADL's independently  Patient Coping Strengths:  . Family . Friends  Patient Self Care Deficits:  Marland Kitchen Depression issues . Anxiety issues  Patient Goals:  - spend time or talk with others at least 2 to 3 times per week - practice relaxation or meditation daily - keep a calendar with appointment dates  Follow Up Plan: [name] to call client on 02/13/21     Norva Riffle.Kischa Altice MSW, [name] Licensed Clinical Social Worker [facility] Care Management [phone]

## 2021-01-16 ENCOUNTER — Other Ambulatory Visit: Payer: Self-pay | Admitting: Family Medicine

## 2021-01-16 DIAGNOSIS — I10 Essential (primary) hypertension: Secondary | ICD-10-CM

## 2021-01-23 ENCOUNTER — Inpatient Hospital Stay: Admission: RE | Admit: 2021-01-23 | Payer: PRIVATE HEALTH INSURANCE | Source: Ambulatory Visit

## 2021-01-30 ENCOUNTER — Other Ambulatory Visit: Payer: Self-pay | Admitting: Family Medicine

## 2021-01-30 DIAGNOSIS — E1165 Type 2 diabetes mellitus with hyperglycemia: Secondary | ICD-10-CM

## 2021-02-06 ENCOUNTER — Encounter: Payer: PRIVATE HEALTH INSURANCE | Attending: Family Medicine | Admitting: Nutrition

## 2021-02-13 ENCOUNTER — Telehealth: Payer: Medicare Other

## 2021-02-13 IMAGING — CT CT CHEST LUNG CANCER SCREENING LOW DOSE W/O CM
2 of 3 series · 15 of 36 positions shown, 18 images · non-contrast
Comparison: 08/08/2018 screening chest CT.

CLINICAL DATA: 69-year-old asymptomatic female current smoker with
35 pack-year smoking history.

EXAM:
CT CHEST WITHOUT CONTRAST LOW-DOSE FOR LUNG CANCER SCREENING
TECHNIQUE: Multidetector CT imaging of the chest was performed following the
standard protocol without IV contrast.

[Series 2: axial st · axial · 0.73mm/px · z∈[-198,+32]mm · 12 of 56 slices shown, 15 images]
[im 5/56  mediastinal]
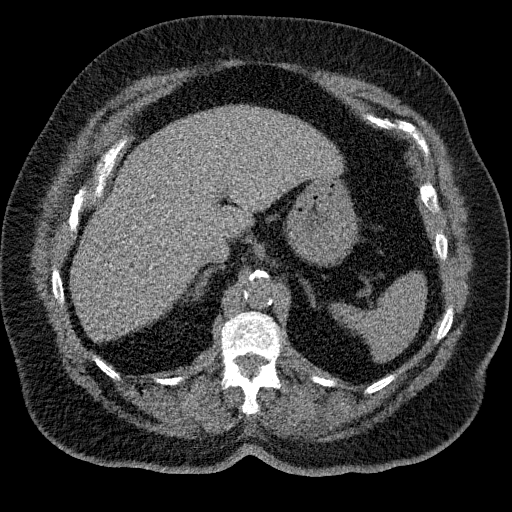
[im 5/56  lung]
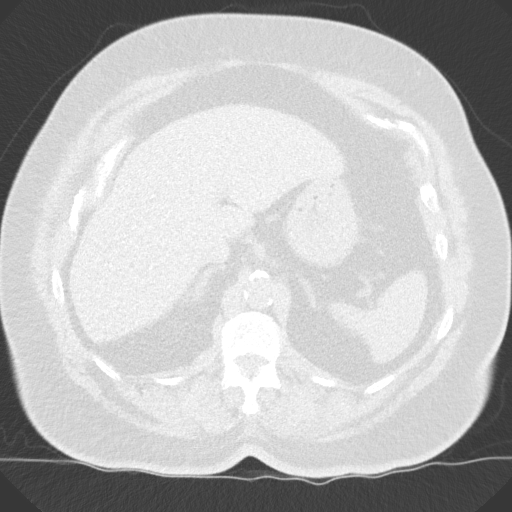
[im 9/56  lung]
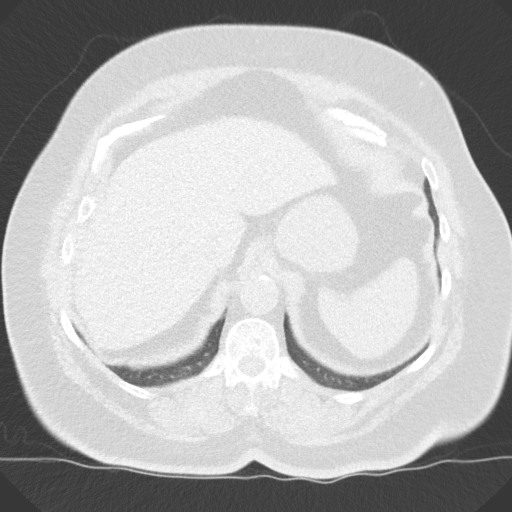
[im 13/56  lung]
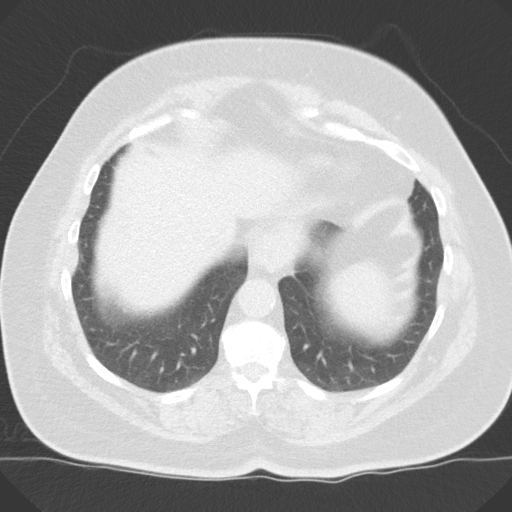
[im 17/56  lung]
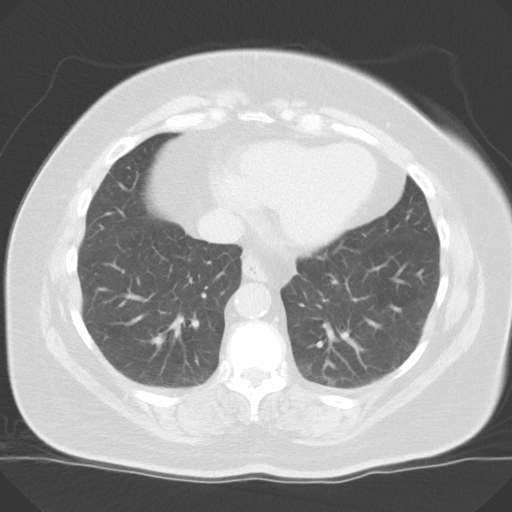
[im 21/56  mediastinal]
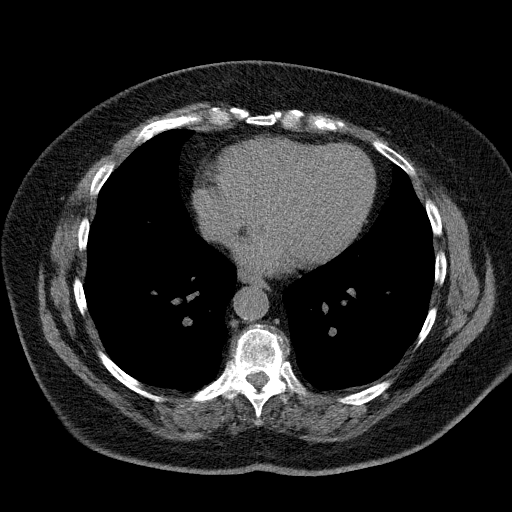
[im 21/56  lung]
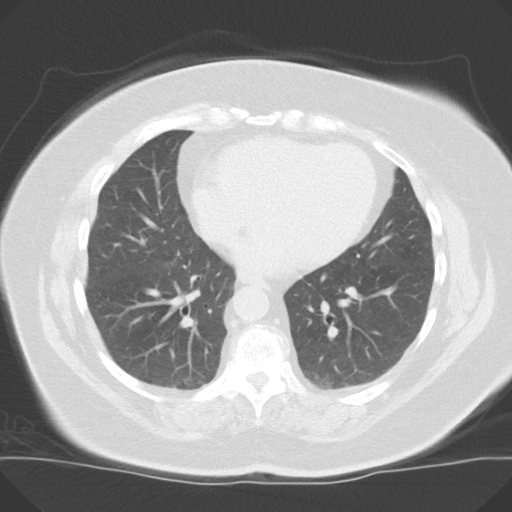
[im 25/56  lung]
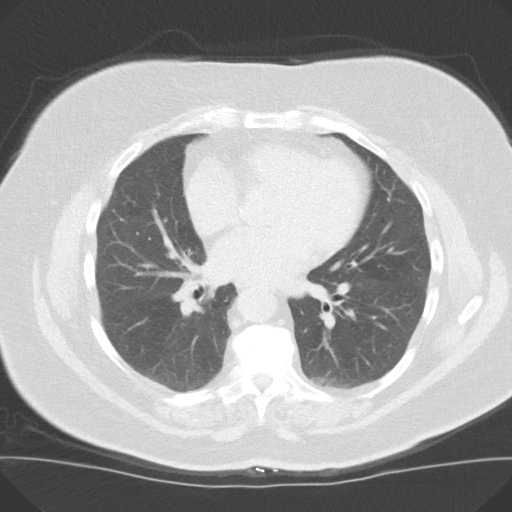
[im 31/56  lung]
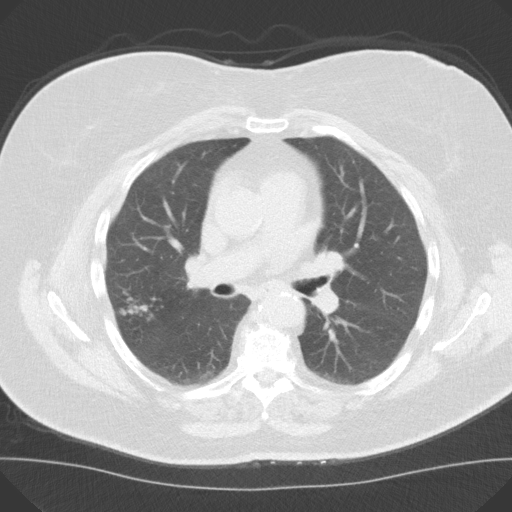
[im 35/56  lung]
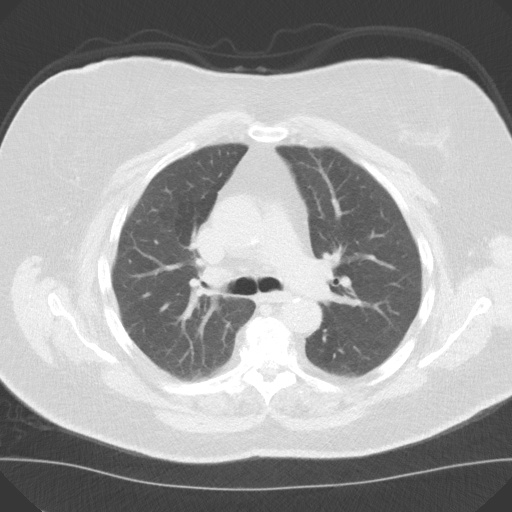
[im 39/56  mediastinal]
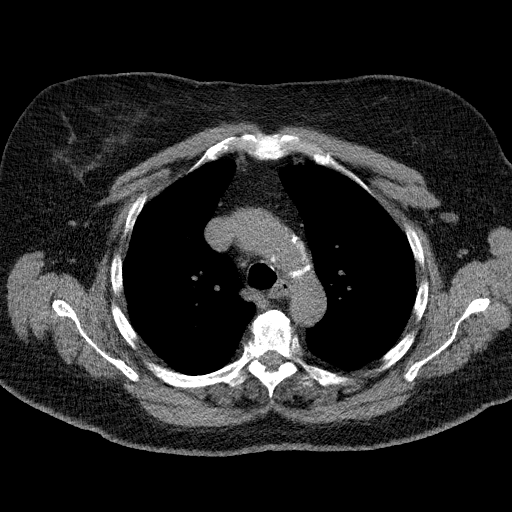
[im 39/56  lung]
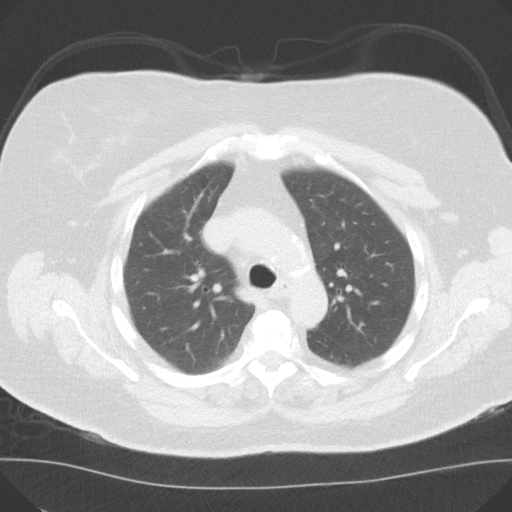
[im 43/56  lung]
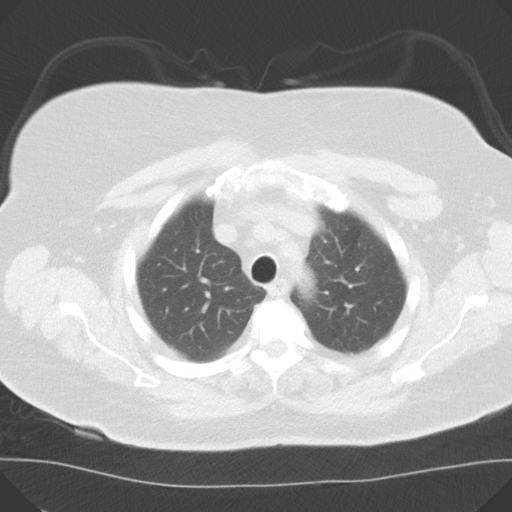
[im 47/56  lung]
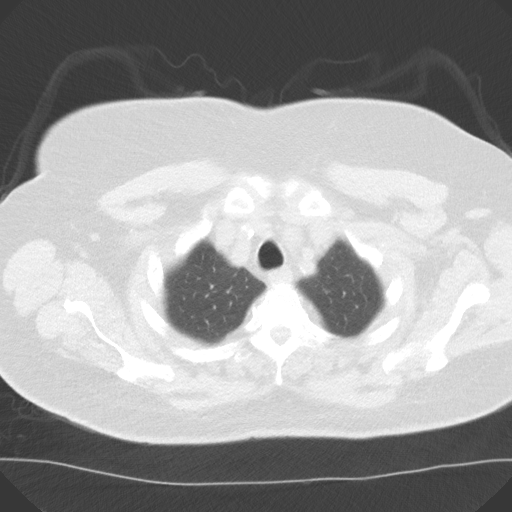
[im 51/56  lung]
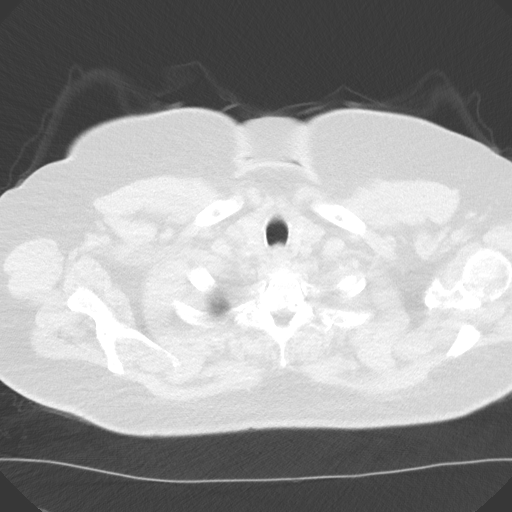

[Series 5: coronal · coronal · 0.56mm/px · 3 of 287 slices shown]
[im 58/287  lung]
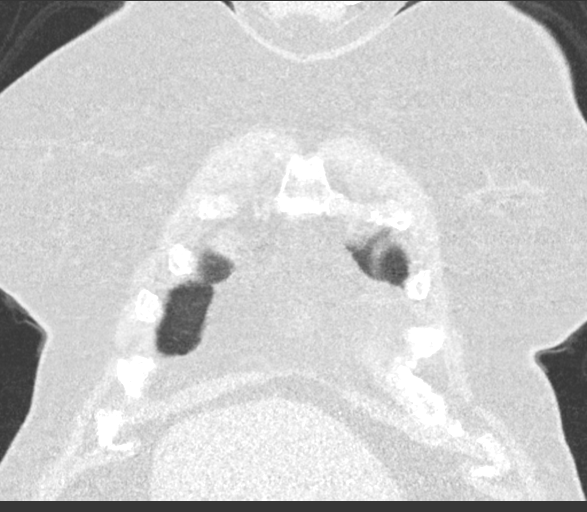
[im 115/287  lung]
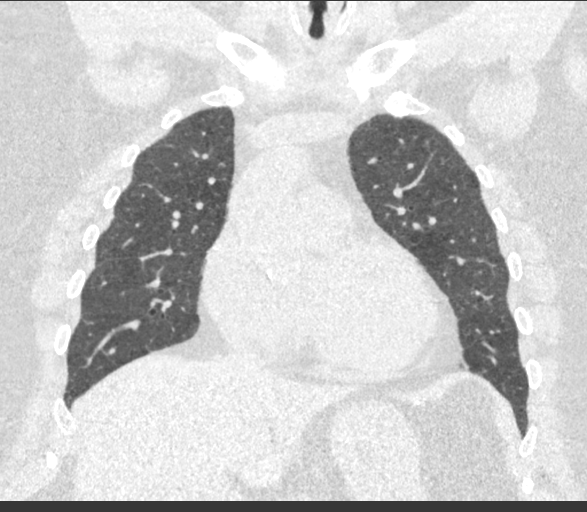
[im 172/287  lung]
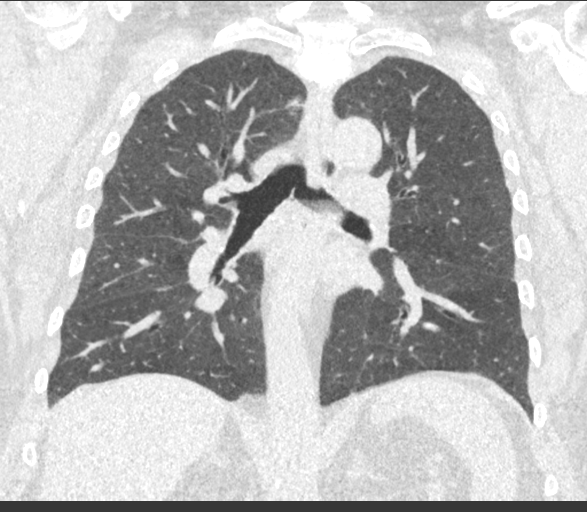

[15 of 36 positions shown; findings below may reference images not displayed]

FINDINGS: Cardiovascular: Normal heart size. No significant pericardial
effusion/thickening. Right coronary atherosclerosis. Atherosclerotic
nonaneurysmal thoracic aorta. Stable top-normal caliber main
pulmonary artery (3.1 cm diameter).

Mediastinum/Nodes: No discrete thyroid nodules. Unremarkable
esophagus. No pathologically enlarged axillary, mediastinal or hilar
lymph nodes, noting limited sensitivity for the detection of hilar
adenopathy on this noncontrast study.

Lungs/Pleura: No pneumothorax. No pleural effusion. Mild
centrilobular emphysema. No acute consolidative airspace disease or
lung masses. Clustered solid nodularity in peripheral right upper
lobe has significantly increased since 08/08/2018, with the dominant
nodule measuring 8.6 mm in volume derived mean diameter (series
4/image 125), previously 2.0 mm. New tiny solid anterior right lower
lobe pulmonary nodule measuring 3.7 mm in volume derived mean
diameter (series 4/image 144). Otherwise stable scattered previously
visualized pulmonary nodules.

Upper abdomen: Small hiatal hernia.

Musculoskeletal: No aggressive appearing focal osseous lesions.
Marked thoracic spondylosis.
IMPRESSION: 1. Lung-RADS 4B, suspicious. Enlarging clustered solid nodularity in
the peripheral right upper lobe, which could be due to a slow
growing neoplasm or worsening chronic infection such as SHANIQUE.
Consider PET-CT for further characterization. Additional imaging
evaluation or consultation with Pulmonology or Thoracic Surgery
recommended.
2. Small hiatal hernia.

Aortic Atherosclerosis (TVGK0-SNA.A) and Emphysema (TVGK0-U1P.R).

These results will be called to the ordering clinician or
representative by the Radiologist Assistant, and communication
documented in the PACS or zVision Dashboard.

## 2021-02-19 ENCOUNTER — Other Ambulatory Visit: Payer: Self-pay | Admitting: Gastroenterology

## 2021-02-19 NOTE — Telephone Encounter (Signed)
Patient needs office.

## 2021-03-04 ENCOUNTER — Other Ambulatory Visit: Payer: Self-pay | Admitting: Family Medicine

## 2021-03-04 DIAGNOSIS — E1165 Type 2 diabetes mellitus with hyperglycemia: Secondary | ICD-10-CM

## 2021-03-19 ENCOUNTER — Other Ambulatory Visit: Payer: Self-pay | Admitting: Family Medicine

## 2021-03-19 ENCOUNTER — Other Ambulatory Visit: Payer: Self-pay | Admitting: Gastroenterology

## 2021-03-19 DIAGNOSIS — F329 Major depressive disorder, single episode, unspecified: Secondary | ICD-10-CM

## 2021-03-19 DIAGNOSIS — F32A Depression, unspecified: Secondary | ICD-10-CM

## 2021-03-24 ENCOUNTER — Ambulatory Visit: Payer: Medicare Other | Admitting: Family Medicine

## 2021-03-25 ENCOUNTER — Other Ambulatory Visit: Payer: Self-pay

## 2021-03-25 ENCOUNTER — Ambulatory Visit (INDEPENDENT_AMBULATORY_CARE_PROVIDER_SITE_OTHER): Payer: Medicare Other | Admitting: Pharmacist

## 2021-03-25 DIAGNOSIS — E1165 Type 2 diabetes mellitus with hyperglycemia: Secondary | ICD-10-CM

## 2021-03-25 NOTE — Progress Notes (Signed)
Chronic Care Management Pharmacy Note  03/25/2021 Name:  Donna Rogers MRN:  [id number] DOB:  [birth year]  Summary: T2DM  Recommendations/Changes made from today's visit: Diabetes: Uncontrolled; current treatment:RYBELSUS, JARDIANCE, GLIPIZIDE;  GFR 66, A1C 9.5%3 WILL TRANSITION JARDIANCE TO FARXIGA (EASE OF PATIENT ASSISTANCE)--SAMPLES GIVEN MAY HAVE TO INCREASE RYBELSUS PENDING BGs Current glucose readings: fasting glucose: <160 , post prandial glucose: 200s Denies hypoglycemic/hyperglycemic symptoms Discussed meal planning options and Plate method for healthy eating Avoid sugary drinks and desserts Incorporate balanced protein, non starchy veggies, 1 serving of carbohydrate with each meal Increase water intake Increase physical activity as able Current exercise: N/A Educated on TRANSITION TO [place] patient finances. WILL APPLY FOR AZ&ME (FARXIGA) AND NOVO [place] PATIENT ASSISTANCE PROGRAMS FOR 2022  Follow Up Plan: Telephone follow up appointment with care management team member scheduled for: 2-3 WEEKS   Subjective: Donna Rogers is an 70 y.o. year old female who is a primary patient of [name], DO.  The CCM team was consulted for assistance with disease management and care coordination needs.    Engaged with patient face to face for follow up visit in response to provider referral for pharmacy case management and/or care coordination services.   Consent to Services:  The patient was given information about Chronic Care Management services, agreed to services, and gave verbal consent prior to initiation of services.  Please see initial visit note for detailed documentation.   Patient Care Team: [name], DO as PCP - General (Family Medicine) Daneil Dolin, MD as Consulting Physician (Gastroenterology) Katha Cabal, [name] as Social Worker (Licensed Clinical Social Worker) Blanca Friend, Royce Macadamia, [facility] (Pharmacist) Lavera Guise,  [facility] as Pharmacist (Family Medicine)   Objective:  Lab Results  Component Value Date   CREATININE 0.94 10/15/2020   CREATININE 0.98 09/08/2019   CREATININE 0.88 08/29/2019    Lab Results  Component Value Date   HGBA1C 9.5 (H) 10/15/2020   Last diabetic Eye exam: No results found for: HMDIABEYEEXA  Last diabetic Foot exam: No results found for: HMDIABFOOTEX      Component Value Date/Time   CHOL 123 10/15/2020 1134   TRIG 184 (H) 10/15/2020 1134   HDL 29 (L) 10/15/2020 1134   CHOLHDL 4.2 10/15/2020 1134   [place] 63 10/15/2020 1134    Hepatic Function Latest Ref Rng & Units 10/15/2020 12/28/2018 08/03/2018  Total Protein 6.0 - 8.5 g/dL 6.9 7.3 6.8  Albumin 3.8 - 4.8 g/dL 4.0 4.3 4.1  AST 0 - 40 IU/L 16 33 34  ALT 0 - 32 IU/L 19 32 50(H)  Alk Phosphatase 44 - 121 IU/L 94 89 99  Total Bilirubin 0.0 - 1.2 mg/dL 0.3 0.4 0.2    Lab Results  Component Value Date/Time   TSH 1.790 07/03/2019 02:28 PM   TSH 0.706 08/03/2018 01:43 PM    CBC Latest Ref Rng & Units 10/15/2020 08/29/2019 08/03/2018  WBC 3.4 - 10.8 x10E3/uL 8.8 9.4 8.0  Hemoglobin 11.1 - 15.9 g/dL 14.7 14.2 14.3  Hematocrit 34.0 - 46.6 % 45.4 43.9 41.2  Platelets 150 - 450 x10E3/uL 269 237 242    No results found for: VD25OH  Clinical ASCVD: No  The ASCVD Risk score Mikey Bussing DC Jr., et al., 2013) failed to calculate for the following reasons:   The valid total cholesterol range is 130 to 320 mg/dL    Other: (CHADS2VASc if Afib, PHQ9 if depression, MMRC or CAT for COPD, ACT, DEXA)  Social  History   Tobacco Use  Smoking Status Every Day   Packs/day: 1.00   Years: 46.00   Pack years: 46.00   Types: Cigarettes  Smokeless Tobacco Never   BP Readings from Last 3 Encounters:  12/20/20 133/82  10/15/20 (!) 159/77  06/18/20 (!) 144/72   Pulse Readings from Last 3 Encounters:  12/20/20 82  10/15/20 68  06/18/20 68   Wt Readings from Last 3 Encounters:  12/20/20 196 lb 9.6 oz (89.2 kg)  10/15/20 205 lb (93  kg)  06/18/20 210 lb (95.3 kg)    Assessment: Review of patient past medical history, allergies, medications, health status, including review of consultants reports, laboratory and other test data, was performed as part of comprehensive evaluation and provision of chronic care management services.   SDOH:  (Social Determinants of Health) assessments and interventions performed:    CCM Care Plan  Allergies  Allergen Reactions   Ciprofloxacin Hcl Rash   Metformin And Related Diarrhea    Medications Reviewed Today     Reviewed by Lavera Guise, [facility] (Pharmacist) on 04/01/21 at 1644  Med List Status: <None>   Medication Order Taking? Sig Documenting Provider Last Dose Status Informant  Alpha-Lipoic Acid 600 MG CAPS [id number] No Take 1 capsule (600 mg total) by mouth daily. For neuropathy [name], DO Taking Active   amLODipine (NORVASC) 5 MG tablet [id number]  TAKE ONE TABLET BY MOUTH EVERY DAY [name], DO  Active   aspirin 81 MG tablet [id number] No Take 81 mg by mouth daily. [provider] Taking Active Self  atenolol (TENORMIN) 25 MG tablet [id number]  TAKE 1 TABLET BY MOUTH DAILY. Ronnie Doss M, DO  Active   azelastine (OPTIVAR) 0.05 % ophthalmic solution [id number] No in the morning and at bedtime. [provider] Taking Active   budesonide (ENTOCORT EC) 3 MG 24 hr capsule [id number]  TAKE 3 CAPSULES ONCE DAILY [name], [name], PA-C  Active   buPROPion (WELLBUTRIN XL) 300 MG 24 hr tablet [id number]  TAKE 1 TABLET DAILY Ronnie Doss M, DO  Active   busPIRone (BUSPAR) 10 MG tablet [id number]  TAKE (1) TABLET TWICE A DAY. Ronnie Doss M, DO  Active   cholecalciferol (VITAMIN D) 1000 UNITS tablet [id number] No Take 1,000 Units by mouth daily. [provider] Taking Active Self  cholestyramine light (PREVALITE) 4 g packet [id number] No Take 1 packet (4 g total) by mouth at bedtime. Gatha Mayer, MD Taking Active   citalopram  (CELEXA) 40 MG tablet [id number] No TAKE 1 TABLET EVERY DAY (Needs to be seen) [name], DO Taking Active   Cyanocobalamin (VITAMIN B 12 PO) [id number] No Take by mouth. [provider] Taking Active   dapagliflozin propanediol (FARXIGA) 10 MG TABS tablet [id number] Yes Take 1 tablet (10 mg total) by mouth daily before breakfast. Ronnie Doss M, DO  Active   diclofenac (VOLTAREN) 75 MG EC tablet [id number]  TAKE ONE TABLET BY MOUTH TWICE DAILY AS NEEDED FOR MODERATE PAIN (USE SPARINGLY) Ronnie Doss M, DO  Active   diphenoxylate-atropine (LOMOTIL) 2.5-0.025 MG tablet [id number] No TAKE 1 OR 2 TABLETS FOUR TIMES DAILY AS NEEDED Gatha Mayer, MD Taking Active   gabapentin (NEURONTIN) 600 MG tablet [id number]  TAKE TWO TABLETS BY MOUTH THREE TIMES DAILY Ronnie Doss M, DO  Active   glipiZIDE (GLUCOTROL) 10 MG tablet [id number] No TAKE (1) TABLET TWICE A DAY BEFORE MEALS. [name], DO Taking Active  HYDROcodone-acetaminophen (NORCO/VICODIN) 5-325 MG per tablet [id number] No Take 1 tablet by mouth every 6 (six) hours as needed. Lysbeth Penner, FNP Taking Active Self  losartan (COZAAR) 100 MG tablet [id number]  TAKE 1 TABLET DAILY Ronnie Doss M, DO  Active   magic mouthwash w/lidocaine SOLN [id number] No Gargle and spit 33m every 6 hours as needed for sore throat. 633mlidocaine, 6040mystatin, 68m38mdrocortisone tab, qs benadryl total 480mL71mttsRonnie DossO Taking Active   omeprazole (PRILOSEC) 20 MG capsule [id number] 1 capsule (20 mg total) by mouth daily. Please cancel protonix GottsJanora NorlanderTaking Active   Pyridoxine HCl (VITAMIN B-6 PO) 31951761950932ake by mouth. [provider] Taking Active   RESTASIS 0.05 % ophthalmic emulsion 31951671245809n the morning and at bedtime.  [provider] Taking Active   Semaglutide (RYBELSUS) 7 MG TABS [id number] 7 mg by mouth daily. GottsRonnie DossO Taking  Active   simvastatin (ZOCOR) 40 MG tablet [id number] 1 TABLET DAILY GottsJanora Norlander Active   SULFASALAZINE PO [id number] by mouth. [provider] Taking Active   vitamin C (ASCORBIC ACID) 500 MG tablet [id number] 500 mg by mouth daily.  [provider] Taking Active Self  zolpidem (AMBIEN) 5 MG tablet [id number] 1 tablet at bedtime as needed for sleep (use sparingly). GottsJanora NorlanderTaking Active             Patient Active Problem List   Diagnosis Date Noted   Carpal tunnel syndrome of right wrist 07/17/2020   Dysphagia 03/27/2020   Generalized anxiety disorder with panic attacks 09/01/2019   Hyperlipidemia associated with type 2 diabetes mellitus (HCC) [place]13/2020   Chronic pain of right knee 12/28/2018   Tobacco abuse 04/10/2016   Lymphocytic colitis 12/03/2015   DM2 (diabetes mellitus, type 2) (HCC) [place]24/2017   Depressive disorder 06/17/2015   Fatigue 06/17/2015   Weakness 06/03/2015   Diarrhea 05/13/2015   Hypertension associated with diabetes (HCC) [place] 01/21/2009   EROSIVE ESOPHAGITIS 01/21/2009   GASTROESOPHAGEAL REFLUX DISEASE, CHRONIC 01/21/2009   Rheumatoid arthritis (HCC) [place]07/2010   TENDINITIS 01/21/2009   EDEMA 01/21/2009    Immunization History  Administered Date(s) Administered   Fluad Quad(high Dose 65+) 05/03/2019   Influenza,inj,Quad PF,6+ Mos 05/25/2013, 07/31/2015, 06/18/2016, 05/16/2018   Influenza-Unspecified 05/07/2019, 07/24/2020   PPD Test 05/08/2013   Pneumococcal Conjugate-13 05/16/2018   Pneumococcal Polysaccharide-23 07/03/2019   Tdap 12/28/2018   Zoster Recombinat (Shingrix) 06/06/2019, 06/30/2019, 10/27/2019    Conditions to be addressed/monitored: DMII  Care Plan : PHARMD MEDICATION MANAGEMENT  Updates made by PruitLavera Guise sStarre 04/01/2021 12:00 AM     Problem: DISEASE PROGRESSION PREVENTION      Long-Range Goal: T2DM   This Visit's Progress: Not on  track  Priority: High  Note:   Current Barriers:  Unable to independently afford treatment regimen Unable to achieve control of T2DM   Pharmacist Clinical Goal(s):  Over the next 90 days, patient will verbalize ability to afford treatment regimen achieve control of T2DM as evidenced by IMPROVED GLYCEMIC CONTROL, GOAL A1C<7%  through collaboration with PharmD and provider.    Interventions: 1:1 collaboration with GottsJanora Norlanderregarding development and update of comprehensive plan of care as evidenced by provider attestation and co-signature Inter-disciplinary care team collaboration (see longitudinal plan of care) Comprehensive medication review performed; medication list updated in electronic  medical record  Diabetes: Uncontrolled; current treatment:RYBELSUS, JARDIANCE, GLIPIZIDE;  GFR 66, A1C 9.5%3 WILL TRANSITION JARDIANCE TO FARXIGA (EASE OF PATIENT ASSISTANCE)--SAMPLES GIVEN MAY HAVE TO INCREASE RYBELSUS PENDING BGs Current glucose readings: fasting glucose: <160 , post prandial glucose: 200s Denies hypoglycemic/hyperglycemic symptoms Discussed meal planning options and Plate method for healthy eating Avoid sugary drinks and desserts Incorporate balanced protein, non starchy veggies, 1 serving of carbohydrate with each meal Increase water intake Increase physical activity as able Current exercise: N/A Educated on TRANSITION TO Donna Rogers patient finances. WILL APPLY FOR AZ&ME (FARXIGA) AND NOVO [place] PATIENT ASSISTANCE PROGRAMS FOR 2022   Patient Goals/Self-Care Activities Over the next 90 days, patient will:  - take medications as prescribed collaborate with provider on medication access solutions  Follow Up Plan: Telephone follow up appointment with care management team member scheduled for: 2-3 WEEKS      Medication Assistance: Application for AZ&ME (FARXIGA), NOVO [place] (RYBELSUS  medication assistance program. in process.  Anticipated  assistance start date TBD.  See plan of care for additional detail.  Patient's preferred pharmacy is:  [place], [address] [address] Phone: [phone] Fax: [place], [place] [address] [place] [address] Phone: [phone] Fax: [phone]   Follow Up:  Patient agrees to Care Plan and Follow-up.  Plan: Telephone follow up appointment with care management team member scheduled for:  2-3 WEEKS  Regina Eck, PharmD, BCPS Clinical Pharmacist, [place]  II Phone [phone]

## 2021-04-01 ENCOUNTER — Ambulatory Visit: Payer: Medicare Other | Admitting: Licensed Clinical Social Worker

## 2021-04-01 DIAGNOSIS — F32A Depression, unspecified: Secondary | ICD-10-CM

## 2021-04-01 DIAGNOSIS — I152 Hypertension secondary to endocrine disorders: Secondary | ICD-10-CM

## 2021-04-01 DIAGNOSIS — E1169 Type 2 diabetes mellitus with other specified complication: Secondary | ICD-10-CM

## 2021-04-01 DIAGNOSIS — F329 Major depressive disorder, single episode, unspecified: Secondary | ICD-10-CM

## 2021-04-01 DIAGNOSIS — E119 Type 2 diabetes mellitus without complications: Secondary | ICD-10-CM

## 2021-04-01 DIAGNOSIS — F410 Panic disorder [episodic paroxysmal anxiety] without agoraphobia: Secondary | ICD-10-CM

## 2021-04-01 MED ORDER — DAPAGLIFLOZIN PROPANEDIOL 10 MG PO TABS: 10.0000 mg | ORAL_TABLET | Freq: Every day | ORAL | 2 refills | Status: DC

## 2021-04-01 NOTE — Patient Instructions (Signed)
Visit Information  PATIENT GOALS:  Goals Addressed               This Visit's Progress     Patient Stated     T2DM PHARMD GOAL (pt-stated)        Current Barriers:  Unable to independently afford treatment regimen Unable to achieve control of T2DM   Pharmacist Clinical Goal(s):  Over the next 90 days, patient will verbalize ability to afford treatment regimen achieve control of T2DM as evidenced by IMPROVED GLYCEMIC CONTROL, GOAL A1C<7% through collaboration with PharmD and provider.    Interventions: 1:1 collaboration with [name], DO regarding development and update of comprehensive plan of care as evidenced by provider attestation and co-signature Inter-disciplinary care team collaboration (see longitudinal plan of care) Comprehensive medication review performed; medication list updated in electronic medical record  Diabetes: Uncontrolled; current treatment:RYBELSUS, JARDIANCE, GLIPIZIDE;  GFR 66, A1C 9.5%3 WILL TRANSITION JARDIANCE TO FARXIGA (EASE OF PATIENT ASSISTANCE)--SAMPLES GIVEN MAY HAVE TO INCREASE RYBELSUS PENDING BGs Current glucose readings: fasting glucose: <160 , post prandial glucose: 200s Denies hypoglycemic/hyperglycemic symptoms Discussed meal planning options and Plate method for healthy eating Avoid sugary drinks and desserts Incorporate balanced protein, non starchy veggies, 1 serving of carbohydrate with each meal Increase water intake Increase physical activity as able Current exercise: N/A Educated on TRANSITION TO [place] patient finances. WILL APPLY FOR [place]&ME (FARXIGA) AND NOVO [place] PATIENT ASSISTANCE PROGRAMS FOR 2022   Patient Goals/Self-Care Activities Over the next 90 days, patient will:  - take medications as prescribed collaborate with provider on medication access solutions  Follow Up Plan: Telephone follow up appointment with care management team member scheduled for: 2-3 WEEKS         The patient  verbalized understanding of instructions, educational materials, and care plan provided today and declined offer to receive copy of patient instructions, educational materials, and care plan.   Telephone follow up appointment with care management team member scheduled for: 2-3 WEEKS  Signature Regina Eck, PharmD, BCPS Clinical Pharmacist, [place]  II Phone [phone]

## 2021-04-01 NOTE — Patient Instructions (Signed)
Visit Information  PATIENT GOALS:  Goals Addressed             This Visit's Progress    Manage My Emotions;Manage depression issues       Timeframe:  Short-Term Goal Priority:  High Progress: On Track Start Date:             04/01/21                Expected End Date:           06/26/21            Follow Up Date 05/09/21   Manage My Emotions (Patient)   Manage Depression Symptoms   Why is this important?   When you are stressed, down or upset, your body reacts too.  For example, your blood pressure may get higher; you may have a headache or stomachache.  When your emotions get the best of you, your body's ability to fight off cold and flu gets weak.  These steps will help you manage your emotions.    Patient Self Care Activities:  Self administers medications as prescribed Attends all scheduled provider appointments Performs ADL's independently  Patient Coping Strengths:  Family Friends  Patient Self Care Deficits:  Depression issues Anxiety issues  Patient Goals:  - spend time or talk with others at least 2 to 3 times per week - practice relaxation or meditation daily - keep a calendar with appointment dates  Follow Up Plan: [name] to call client or Donna Rogers on 05/09/21      Norva Riffle.Twinkle Sockwell MSW, [name] Licensed Clinical Social Worker [facility] Care Management [phone]

## 2021-04-01 NOTE — Chronic Care Management (AMB) (Signed)
Chronic Care Management    Clinical Social Work Note  04/01/2021 Name: Donna Rogers MRN: [id number] DOB: Aug 23, 1950  Donna Rogers is a 70 y.o. year old female who is a primary care patient of [name], DO. The CCM team was consulted to assist the patient with chronic disease management and/or care coordination needs related to: [employer] .   Engaged with patient /significant other for patient, [name], by telephone for follow up visit in response to provider referral for social work chronic care management and care coordination services.   Consent to Services:  The patient was given information about Chronic Care Management services, agreed to services, and gave verbal consent prior to initiation of services.  Please see initial visit note for detailed documentation.   Patient agreed to services and consent obtained.   Assessment: Review of patient past medical history, allergies, medications, and health status, including review of relevant consultants reports was performed today as part of a comprehensive evaluation and provision of chronic care management and care coordination services.     SDOH (Social Determinants of Health) assessments and interventions performed: Patient has anxiety issues and reports that she sometimes worries about numerous issues.  Has strong support from [name], client is taking medications as prescribed SDOH Interventions    Flowsheet Row Most Recent Value  SDOH Interventions   Depression Interventions/Treatment  Currently on Treatment        Advanced Directives Status: See Vynca application for related entries.  CCM Care Plan  Allergies  Allergen Reactions   Ciprofloxacin Hcl Rash   Metformin And Related Diarrhea    Outpatient Encounter Medications as of 04/01/2021  Medication Sig   Alpha-Lipoic Acid 600 MG CAPS Take 1 capsule (600 mg total) by mouth daily. For neuropathy   amLODipine (NORVASC) 5 MG tablet TAKE  ONE TABLET BY MOUTH EVERY DAY   aspirin 81 MG tablet Take 81 mg by mouth daily.   atenolol (TENORMIN) 25 MG tablet TAKE 1 TABLET BY MOUTH DAILY.   azelastine (OPTIVAR) 0.05 % ophthalmic solution in the morning and at bedtime.   azithromycin (ZITHROMAX) 250 MG tablet Take 500 mg once, then 250 mg for four days   budesonide (ENTOCORT EC) 3 MG 24 hr capsule TAKE 3 CAPSULES ONCE DAILY   buPROPion (WELLBUTRIN XL) 300 MG 24 hr tablet TAKE 1 TABLET DAILY   busPIRone (BUSPAR) 10 MG tablet TAKE (1) TABLET TWICE A DAY.   cholecalciferol (VITAMIN D) 1000 UNITS tablet Take 1,000 Units by mouth daily.   cholestyramine light (PREVALITE) 4 g packet Take 1 packet (4 g total) by mouth at bedtime.   citalopram (CELEXA) 40 MG tablet TAKE 1 TABLET EVERY DAY (Needs to be seen)   Cyanocobalamin (VITAMIN B 12 PO) Take by mouth.   diclofenac (VOLTAREN) 75 MG EC tablet TAKE ONE TABLET BY MOUTH TWICE DAILY AS NEEDED FOR MODERATE PAIN (USE SPARINGLY)   diphenoxylate-atropine (LOMOTIL) 2.5-0.025 MG tablet TAKE 1 OR 2 TABLETS FOUR TIMES DAILY AS NEEDED   empagliflozin (JARDIANCE) 25 MG TABS tablet Take 1 tablet (25 mg total) by mouth daily before breakfast.   gabapentin (NEURONTIN) 600 MG tablet TAKE TWO TABLETS BY MOUTH THREE TIMES DAILY   glipiZIDE (GLUCOTROL) 10 MG tablet TAKE (1) TABLET TWICE A DAY BEFORE MEALS.   HYDROcodone-acetaminophen (NORCO/VICODIN) 5-325 MG per tablet Take 1 tablet by mouth every 6 (six) hours as needed.   losartan (COZAAR) 100 MG tablet TAKE 1 TABLET DAILY   magic mouthwash  w/lidocaine SOLN Gargle and spit 6m every 6 hours as needed for sore throat. 666mlidocaine, 6078mystatin, '60mg'$  hydrocortisone tab, qs benadryl total 480m35m omeprazole (PRILOSEC) 20 MG capsule Take 1 capsule (20 mg total) by mouth daily. Please cancel protonix   Pyridoxine HCl (VITAMIN B-6 PO) Take by mouth.   RESTASIS 0.05 % ophthalmic emulsion in the morning and at bedtime.    Semaglutide (RYBELSUS) 7 MG TABS Take 7  mg by mouth daily.   simvastatin (ZOCOR) 40 MG tablet TAKE 1 TABLET DAILY   SULFASALAZINE PO Take by mouth.   vitamin C (ASCORBIC ACID) 500 MG tablet Take 500 mg by mouth daily.    zolpidem (AMBIEN) 5 MG tablet Take 1 tablet at bedtime as needed for sleep (use sparingly).   No facility-administered encounter medications on file as of 04/01/2021.    Patient Active Problem List   Diagnosis Date Noted   Carpal tunnel syndrome of right wrist 07/17/2020   Dysphagia 03/27/2020   Generalized anxiety disorder with panic attacks 09/01/2019   Hyperlipidemia associated with type 2 diabetes mellitus (HCC)[place]/13/2020   Chronic pain of right knee 12/28/2018   Tobacco abuse 04/10/2016   Lymphocytic colitis 12/03/2015   DM2 (diabetes mellitus, type 2) (HCC)[place]/24/2017   Depressive disorder 06/17/2015   Fatigue 06/17/2015   Weakness 06/03/2015   Diarrhea 05/13/2015   Hypertension associated with diabetes (HCC)[place] OBESITY 01/21/2009   EROSIVE ESOPHAGITIS 01/21/2009   GASTROESOPHAGEAL REFLUX DISEASE, CHRONIC 01/21/2009   Rheumatoid arthritis (HCC)[place]/02/2009   TENDINITIS 01/21/2009   EDEMA 01/21/2009    Conditions to be addressed/monitored: monitor client management of anxiety and depression issues   Care Plan : [name] Care plan  Updates made by ForrKatha CabalSW since 04/01/2021 12:00 AM     Problem: Emotional Distress      Goal: Emotional Health Supported;Manage Depression symptoms   Start Date: 04/01/2021  Expected End Date: 06/26/2021  This Visit's Progress: On track  Recent Progress: On track  Priority: High  Note:   Current Barriers:  Chronic Mental Health needs related to anxiety and depression Challenges in managing Diabetes Suicidal Ideation/Homicidal Ideation: No  Clinical Social Work Goal(s):  patient will work with SW monthly by telephone or in person to reduce or manage symptoms related to anxiety and depression issues faced patient will work with SW monthly to  discuss self care, involvement in recreational activities, and client stress management strategies  Interventions: 1:1 collaboration with GottJanora Norlander regarding development and update of comprehensive plan of care as evidenced by provider attestation and co-signature Talked with LeonRennis Chrisgnificant other for client, about pain issues of client Talked with LeonMilbert Coulterut mobility of client (has a walker to use as needed) Talked with LeonMilbert Coulterut client occasional dizziness Talked with LeonMilbert Coulterut client mood (client occasionally worries about things; she has trouble sleeping; gets sad around time of Holidays) Talked with LeonMilbert Coulterut upcoming client medical appointments Talked with LeonMilbert Coulterut transport needs of client Talked with LeonMilbert Coulterut vision of client Talked with LeonMilbert Coulterut medication procurement for client Encouraged client to continue to talk with Dr. [facility], about Diabetes medication needs and medication costs for client  Patient Self Care Activities:  Self administers medications as prescribed Attends all scheduled provider appointments Performs ADL's independently  Patient Coping Strengths:  Family Friends  Patient Self Care Deficits:  Depression issues Anxiety issues  Patient Goals:  - spend time or talk with  others at least 2 to 3 times per week - practice relaxation or meditation daily - keep a calendar with appointment dates  Follow Up Plan: [name] to call client or [name] on 05/09/21     Norva Riffle.Silverio Hagan MSW, [name] Licensed Clinical Social Worker [facility] Care Management [phone]

## 2021-04-03 ENCOUNTER — Encounter: Payer: Self-pay | Admitting: Gastroenterology

## 2021-04-03 ENCOUNTER — Ambulatory Visit (INDEPENDENT_AMBULATORY_CARE_PROVIDER_SITE_OTHER): Payer: Medicare Other | Admitting: Gastroenterology

## 2021-04-03 VITALS — BP 150/84 | HR 80 | Ht 60.0 in | Wt 188.0 lb

## 2021-04-03 DIAGNOSIS — K52832 Lymphocytic colitis: Secondary | ICD-10-CM

## 2021-04-03 MED ORDER — BUDESONIDE 3 MG PO CPEP: 9.0000 mg | ORAL_CAPSULE | Freq: Every day | ORAL | 0 refills | Status: DC

## 2021-04-03 NOTE — Patient Instructions (Signed)
We have sent the following medications to your pharmacy for you to pick up at your convenience: Budesonide 9 mg once daily (placed on hold at pharmacy).   Call office if symptoms worsen and if you start Budesonide, ask for Koren Shiver, RN.  If you are age 68 or older, your body mass index should be between 23-30. Your Body mass index is 36.72 kg/m. If this is out of the aforementioned range listed, please consider follow up with your Primary Care Provider.  If you are age 75 or younger, your body mass index should be between 19-25. Your Body mass index is 36.72 kg/m. If this is out of the aformentioned range listed, please consider follow up with your Primary Care Provider.   __________________________________________________________  The [facility] providers would like to encourage you to use [facility] to communicate with providers for non-urgent requests or questions.  Due to long hold times on the telephone, sending your provider a message by [facility] may be a faster and more efficient way to get a response.  Please allow 48 business hours for a response.  Please remember that this is for non-urgent requests.

## 2021-04-03 NOTE — Progress Notes (Signed)
04/03/2021 Donna Rogers:[id number] 1951/05/22   HISTORY OF PRESENT ILLNESS: This is a 70 year old female who is a patient Dr. Celesta Rogers.  She has a history of GERD and lymphocytic colitis.  She is here for follow-up of her lymphocytic colitis and is requesting refills of her budesonide.  When she last saw Dr. Carlean Purl in November 2021 he discontinued her budesonide and she tried Questran instead.  There were several phone calls back-and-forth over the course of a couple of months.  It looks like the Questran was discontinued as it was not helping and she was eventually placed back on budesonide, it looks like in March 2022.  She says that it works extremely well for her.  She is having anywhere from 1-3 bowel movements a day and they are solid.  She is asking for refills, but then tells me that she has not been on the medication for the past week as she had run out of her medication  Still doing well so far without it.  In regards to her GERD she is only on omeprazole 20 mg daily.  She says that she is doing well with that.  She had previously been complaining of dysphagia and had an esophagram that showed possible stricture or narrowing.  Subsequent EGD did not show that.  She continued to complain of dysphagia so modified barium swallow study was ordered.  She never had that performed.  She says that she is no longer having issues with swallowing.   Past Medical History:  Diagnosis Date   Allergy    seasonal allergies   Anemia    hx of   Arthritis    rheumatoid   Depression    on meds   Diabetes ([place])    Diabetic peripheral neuropathy (HCC)    GERD (gastroesophageal reflux disease)    on meds   Headache    Hyperlipidemia    on meds   Hyperplastic rectal polyp 10/21/2015   Hypertension    pt. denies   Lymphocytic colitis    Neuropathy    Rheumatoid arthritis ([place])    Shortness of breath dyspnea    with exertion   Past Surgical History:  Procedure Laterality Date    BREAST LUMPECTOMY Right 2018   BREAST LUMPECTOMY WITH RADIOACTIVE SEED LOCALIZATION Left 03/03/2016   Procedure: BREAST LUMPECTOMY WITH RADIOACTIVE SEED LOCALIZATION;  Surgeon: Coralie Keens, MD;  Location: [facility];  Service: General;  Laterality: Left;   COLONOSCOPY  08/2002   RMR: internal hemorrhoids   COLONOSCOPY N/A 10/21/2015   Procedure: COLONOSCOPY;  Surgeon: Daneil Dolin, MD;  [facility];  Service: Endoscopy;  Laterality: N/A;  250 - moved to 2:35 - office to notify pt   CYST REMOVAL TRUNK     ESOPHAGOGASTRODUODENOSCOPY  08/2002   RMR: nonerosive reflux esophagitis, hiatal hernia   TONSILLECTOMY     WISDOM TOOTH EXTRACTION      reports that she has been smoking cigarettes. She has a 46.00 pack-year smoking history. She has never used smokeless tobacco. She reports that she does not drink alcohol and does not use drugs. family history includes Alzheimer's disease in her mother; Arthritis in her sister; Diabetes in her brother, father, and sister; Heart disease in her father; Hypertension in her brother; Lung cancer (age of onset: 8) in her mother. Allergies  Allergen Reactions   Ciprofloxacin Hcl Rash   Metformin And Related Diarrhea      Outpatient Encounter Medications as of 04/03/2021  Medication Sig   Alpha-Lipoic Acid 600 MG CAPS Take 1 capsule (600 mg total) by mouth daily. For neuropathy   amLODipine (NORVASC) 5 MG tablet TAKE ONE TABLET BY MOUTH EVERY DAY   aspirin 81 MG tablet Take 81 mg by mouth daily.   atenolol (TENORMIN) 25 MG tablet TAKE 1 TABLET BY MOUTH DAILY.   azelastine (OPTIVAR) 0.05 % ophthalmic solution in the morning and at bedtime.   budesonide (ENTOCORT EC) 3 MG 24 hr capsule TAKE 3 CAPSULES ONCE DAILY   buPROPion (WELLBUTRIN XL) 300 MG 24 hr tablet TAKE 1 TABLET DAILY   busPIRone (BUSPAR) 10 MG tablet TAKE (1) TABLET TWICE A DAY.   cholecalciferol (VITAMIN D) 1000 UNITS tablet Take 1,000 Units by mouth daily.   citalopram (CELEXA) 40 MG  tablet TAKE 1 TABLET EVERY DAY (Needs to be seen)   Cyanocobalamin (VITAMIN B 12 PO) Take by mouth.   dapagliflozin propanediol (FARXIGA) 10 MG TABS tablet Take 1 tablet (10 mg total) by mouth daily before breakfast.   diclofenac (VOLTAREN) 75 MG EC tablet TAKE ONE TABLET BY MOUTH TWICE DAILY AS NEEDED FOR MODERATE PAIN (USE SPARINGLY)   diphenoxylate-atropine (LOMOTIL) 2.5-0.025 MG tablet TAKE 1 OR 2 TABLETS FOUR TIMES DAILY AS NEEDED   folic acid (FOLVITE) 1 MG tablet Take 1 mg by mouth daily.   gabapentin (NEURONTIN) 600 MG tablet TAKE TWO TABLETS BY MOUTH THREE TIMES DAILY   glipiZIDE (GLUCOTROL) 10 MG tablet TAKE (1) TABLET TWICE A DAY BEFORE MEALS.   HYDROcodone-acetaminophen (NORCO/VICODIN) 5-325 MG per tablet Take 1 tablet by mouth every 6 (six) hours as needed.   losartan (COZAAR) 100 MG tablet TAKE 1 TABLET DAILY   methotrexate (RHEUMATREX) 2.5 MG tablet Take 15 mg by mouth once a week.   omeprazole (PRILOSEC) 20 MG capsule Take 1 capsule (20 mg total) by mouth daily. Please cancel protonix   Pyridoxine HCl (VITAMIN B-6 PO) Take by mouth.   Semaglutide (RYBELSUS) 7 MG TABS Take 7 mg by mouth daily.   simvastatin (ZOCOR) 40 MG tablet TAKE 1 TABLET DAILY   SULFASALAZINE PO Take by mouth.   vitamin C (ASCORBIC ACID) 500 MG tablet Take 500 mg by mouth daily.    zolpidem (AMBIEN) 5 MG tablet Take 1 tablet at bedtime as needed for sleep (use sparingly).   [DISCONTINUED] cholestyramine light (PREVALITE) 4 g packet Take 1 packet (4 g total) by mouth at bedtime.   [DISCONTINUED] magic mouthwash w/lidocaine SOLN Gargle and spit 53m every 6 hours as needed for sore throat. 683mlidocaine, 6018mystatin, '60mg'$  hydrocortisone tab, qs benadryl total 480m12m [DISCONTINUED] RESTASIS 0.05 % ophthalmic emulsion in the morning and at bedtime.    No facility-administered encounter medications on file as of 04/03/2021.     REVIEW OF SYSTEMS  : All other systems reviewed and negative except where noted  in the History of Present Illness.   PHYSICAL EXAM: BP (!) 150/84 (BP Location: Left Arm, Patient Position: Sitting, Cuff Size: Normal)   Pulse 80   Ht 5' (1.524 m)   Wt 188 lb (85.3 kg)   BMI 36.72 kg/m  General: Well developed white female in no acute distress Head: Normocephalic and atraumatic Eyes:  Sclerae anicteric, conjunctiva pink. Ears: Normal auditory acuity Lungs: Clear throughout to auscultation; no W/R/R. Heart: Regular rate and rhythm; no M/R/G. Abdomen: Soft, non-distended.  BS present.  Non-tender. Musculoskeletal: Symmetrical with no gross deformities  Skin: No lesions on visible extremities Extremities: No edema  Neurological: Alert oriented x 4, grossly non-focal Psychological:  Alert and cooperative. Normal mood and affect  ASSESSMENT AND PLAN: *Lymphocytic colitis: Had been off of budesonide for several months and tried Questran without improvement in her symptoms.  Looks like the budesonide was restarted back in March.  She is here today to get refills, although she has not been taking the medication for the past week since she had run out and still continues to do well.  I have asked her to see how she does without the medication.  She asked for me to send a prescription to her pharmacy to have on hand in case her symptoms were to return.  She was advised that if her symptoms return then she needs to make [place] aware that she is picking up the prescription and to keep [place] in the loop for further instructions, etc. *GERD: She reports this is well controlled on omeprazole 20 mg once daily.  She also reports that she has not been having any more issues with dysphagia so never had the modified barium swallow study performed.   CC:  Donna Norlander, DO

## 2021-04-04 ENCOUNTER — Ambulatory Visit (INDEPENDENT_AMBULATORY_CARE_PROVIDER_SITE_OTHER): Payer: Medicare Other | Admitting: Family Medicine

## 2021-04-04 ENCOUNTER — Encounter: Payer: Self-pay | Admitting: Family Medicine

## 2021-04-04 ENCOUNTER — Other Ambulatory Visit: Payer: Self-pay

## 2021-04-04 ENCOUNTER — Ambulatory Visit (INDEPENDENT_AMBULATORY_CARE_PROVIDER_SITE_OTHER): Payer: Medicare Other

## 2021-04-04 VITALS — BP 164/111 | HR 81 | Temp 97.9°F | Ht 60.0 in | Wt 188.8 lb

## 2021-04-04 DIAGNOSIS — F410 Panic disorder [episodic paroxysmal anxiety] without agoraphobia: Secondary | ICD-10-CM

## 2021-04-04 DIAGNOSIS — F411 Generalized anxiety disorder: Secondary | ICD-10-CM

## 2021-04-04 DIAGNOSIS — E1165 Type 2 diabetes mellitus with hyperglycemia: Secondary | ICD-10-CM

## 2021-04-04 DIAGNOSIS — E1169 Type 2 diabetes mellitus with other specified complication: Secondary | ICD-10-CM

## 2021-04-04 DIAGNOSIS — E119 Type 2 diabetes mellitus without complications: Secondary | ICD-10-CM | POA: Diagnosis not present

## 2021-04-04 DIAGNOSIS — M85851 Other specified disorders of bone density and structure, right thigh: Secondary | ICD-10-CM | POA: Diagnosis not present

## 2021-04-04 DIAGNOSIS — E1159 Type 2 diabetes mellitus with other circulatory complications: Secondary | ICD-10-CM

## 2021-04-04 DIAGNOSIS — F32A Depression, unspecified: Secondary | ICD-10-CM

## 2021-04-04 DIAGNOSIS — I152 Hypertension secondary to endocrine disorders: Secondary | ICD-10-CM

## 2021-04-04 DIAGNOSIS — F329 Major depressive disorder, single episode, unspecified: Secondary | ICD-10-CM

## 2021-04-04 DIAGNOSIS — M85831 Other specified disorders of bone density and structure, right forearm: Secondary | ICD-10-CM | POA: Diagnosis not present

## 2021-04-04 DIAGNOSIS — E785 Hyperlipidemia, unspecified: Secondary | ICD-10-CM

## 2021-04-04 DIAGNOSIS — Z780 Asymptomatic menopausal state: Secondary | ICD-10-CM

## 2021-04-04 LAB — BMP8+EGFR
BUN/Creatinine Ratio: 12 (ref 12–28)
BUN: 9 mg/dL (ref 8–27)
CO2: 25 mmol/L (ref 20–29)
Calcium: 9.1 mg/dL (ref 8.7–10.3)
Chloride: 99 mmol/L (ref 96–106)
Creatinine, Ser: 0.78 mg/dL (ref 0.57–1.00)
Glucose: 160 mg/dL — ABNORMAL HIGH (ref 65–99)
Potassium: 4 mmol/L (ref 3.5–5.2)
Sodium: 141 mmol/L (ref 134–144)
eGFR: 83 mL/min/{1.73_m2} (ref >59)

## 2021-04-04 LAB — BAYER DCA HB A1C WAIVED: HB A1C (BAYER DCA - WAIVED): 11.7 % — ABNORMAL HIGH (ref <7.0)

## 2021-04-04 LAB — HM DIABETES EYE EXAM

## 2021-04-04 MED ORDER — RYBELSUS 14 MG PO TABS: 14.0000 mg | ORAL_TABLET | Freq: Every day | ORAL | 0 refills | Status: DC

## 2021-04-04 MED ORDER — RYBELSUS 7 MG PO TABS: 14.0000 mg | ORAL_TABLET | Freq: Every day | ORAL | 0 refills | Status: DC

## 2021-04-04 NOTE — Progress Notes (Signed)
Subjective: CC:DM PCP: [name], DO FKC:LEXNTZGY A Charlet is a 70 y.o. female presenting to clinic today for:  1. Type 2 Diabetes with hypertension, hyperlipidemia:  Patient was seen about a week and a half ago by CCM.  She was transition from [place] to [place] for ease of patient assistance.  She was given samples.  Planning on increasing Rybelsus from 79m pending her A1c today.  She is compliant with all medications.  She is point-of-care fingersticks to monitor blood sugars and blood sugars have been running quite a bit better in the 100s now.  Last eye exam: needs Last foot exam: UTD Last A1c:  Lab Results  Component Value Date   HGBA1C 9.5 (H) 10/15/2020   Nephropathy screen indicated?: UTD Last flu, zoster and/or pneumovax:  Immunization History  Administered Date(s) Administered   Fluad Quad(high Dose 65+) 05/03/2019   Influenza,inj,Quad PF,6+ Mos 05/25/2013, 07/31/2015, 06/18/2016, 05/16/2018   Influenza-Unspecified 05/07/2019, 07/24/2020   PPD Test 05/08/2013   Pneumococcal Conjugate-13 05/16/2018   Pneumococcal Polysaccharide-23 07/03/2019   Tdap 12/28/2018   Zoster Recombinat (Shingrix) 06/06/2019, 06/30/2019, 10/27/2019    ROS: She admits to poor vision.  This is despite use of her eyeglasses.  She sometimes has dizziness.  She notes an improvement in her neuropathy with the alpha lipoic acid.  Wondering if there is any medication she can adjust in efforts to reduce dizziness.  She does sometimes fall as a result of dizziness.  No reports of nausea, vomiting, abdominal pain, diarrhea.  ROS: Per HPI  Allergies  Allergen Reactions   Ciprofloxacin Hcl Rash   Metformin And Related Diarrhea   Past Medical History:  Diagnosis Date   Allergy    seasonal allergies   Anemia    hx of   Arthritis    rheumatoid   Depression    on meds   Diabetes ([place]    Diabetic peripheral neuropathy (HCC)    GERD (gastroesophageal reflux disease)    on meds    Headache    Hyperlipidemia    on meds   Hyperplastic rectal polyp 10/21/2015   Hypertension    pt. denies   Lymphocytic colitis    Neuropathy    Rheumatoid arthritis (HCC)    Shortness of breath dyspnea    with exertion    Current Outpatient Medications:    Alpha-Lipoic Acid 600 MG CAPS, Take 1 capsule (600 mg total) by mouth daily. For neuropathy, Disp: 90 capsule, Rfl: 3   amLODipine (NORVASC) 5 MG tablet, TAKE ONE TABLET BY MOUTH EVERY DAY, Disp: 90 tablet, Rfl: 0   aspirin 81 MG tablet, Take 81 mg by mouth daily., Disp: , Rfl:    atenolol (TENORMIN) 25 MG tablet, TAKE 1 TABLET BY MOUTH DAILY., Disp: 90 tablet, Rfl: 0   azelastine (OPTIVAR) 0.05 % ophthalmic solution, in the morning and at bedtime., Disp: , Rfl:    budesonide (ENTOCORT EC) 3 MG 24 hr capsule, Take 3 capsules (9 mg total) by mouth daily., Disp: 270 capsule, Rfl: 0   buPROPion (WELLBUTRIN XL) 300 MG 24 hr tablet, TAKE 1 TABLET DAILY, Disp: 90 tablet, Rfl: 0   busPIRone (BUSPAR) 10 MG tablet, TAKE (1) TABLET TWICE A DAY., Disp: 60 tablet, Rfl: 0   cholecalciferol (VITAMIN D) 1000 UNITS tablet, Take 1,000 Units by mouth daily., Disp: , Rfl:    citalopram (CELEXA) 40 MG tablet, TAKE 1 TABLET EVERY DAY (Needs to be seen), Disp: 90 tablet, Rfl: 1   Cyanocobalamin (VITAMIN  B 12 PO), Take by mouth., Disp: , Rfl:    dapagliflozin propanediol (FARXIGA) 10 MG TABS tablet, Take 1 tablet (10 mg total) by mouth daily before breakfast., Disp: 90 tablet, Rfl: 2   diclofenac (VOLTAREN) 75 MG EC tablet, TAKE ONE TABLET BY MOUTH TWICE DAILY AS NEEDED FOR MODERATE PAIN (USE SPARINGLY), Disp: 30 tablet, Rfl: 0   diphenoxylate-atropine (LOMOTIL) 2.5-0.025 MG tablet, TAKE 1 OR 2 TABLETS FOUR TIMES DAILY AS NEEDED, Disp: 90 tablet, Rfl: 1   folic acid (FOLVITE) 1 MG tablet, Take 1 mg by mouth daily., Disp: , Rfl:    gabapentin (NEURONTIN) 600 MG tablet, TAKE TWO TABLETS BY MOUTH THREE TIMES DAILY, Disp: 180 tablet, Rfl: 2   glipiZIDE  (GLUCOTROL) 10 MG tablet, TAKE (1) TABLET TWICE A DAY BEFORE MEALS., Disp: 180 tablet, Rfl: 0   HYDROcodone-acetaminophen (NORCO/VICODIN) 5-325 MG per tablet, Take 1 tablet by mouth every 6 (six) hours as needed., Disp: 60 tablet, Rfl: 0   losartan (COZAAR) 100 MG tablet, TAKE 1 TABLET DAILY, Disp: 90 tablet, Rfl: 0   methotrexate (RHEUMATREX) 2.5 MG tablet, Take 15 mg by mouth once a week., Disp: , Rfl:    omeprazole (PRILOSEC) 20 MG capsule, Take 1 capsule (20 mg total) by mouth daily. Please cancel protonix, Disp: 90 capsule, Rfl: 3   Pyridoxine HCl (VITAMIN B-6 PO), Take by mouth., Disp: , Rfl:    Semaglutide (RYBELSUS) 7 MG TABS, Take 7 mg by mouth daily., Disp: 30 tablet, Rfl: 0   simvastatin (ZOCOR) 40 MG tablet, TAKE 1 TABLET DAILY, Disp: 90 tablet, Rfl: 1   SULFASALAZINE PO, Take by mouth., Disp: , Rfl:    vitamin C (ASCORBIC ACID) 500 MG tablet, Take 500 mg by mouth daily. , Disp: , Rfl:    zolpidem (AMBIEN) 5 MG tablet, Take 1 tablet at bedtime as needed for sleep (use sparingly)., Disp: 20 tablet, Rfl: 1 Social History   Socioeconomic History   Marital status: Divorced    Spouse name: Not on file   Number of children: 1   Years of education: 11th   Highest education level: 11th grade  Occupational History   Occupation: retired  Tobacco Use   Smoking status: Every Day    Packs/day: 1.00    Years: 46.00    Pack years: 46.00    Types: Cigarettes   Smokeless tobacco: Never  Vaping Use   Vaping Use: Some days  Substance and Sexual Activity   Alcohol use: No    Alcohol/week: 0.0 standard drinks   Drug use: No   Sexual activity: Not Currently  Other Topics Concern   Not on file  Social History Narrative   The patient is divorced she has 1 sonWho is married that she has a grandchild.   She does not use alcohol or drugs she drinks 3-4 caffeinated beverages daily   She is a smoker   Social Determinants of Radio broadcast assistant Strain: Not on file  Food Insecurity:  Not on file  Transportation Needs: Not on file  Physical Activity: Not on file  Stress: Not on file  Social Connections: Not on file  Intimate Partner Violence: Not on file   Family History  Problem Relation Age of Onset   Alzheimer's disease Mother    Lung cancer Mother 2   Diabetes Father    Heart disease Father    Arthritis Sister    Diabetes Sister    Diabetes Brother    Hypertension Brother  Colon cancer Neg Hx    Stomach cancer Neg Hx    Colon polyps Neg Hx    Esophageal cancer Neg Hx    Rectal cancer Neg Hx     Objective: Office vital signs reviewed. BP (!) 157/84   Pulse 81   Temp 97.9 F (36.6 C)   Ht 5' (1.524 m)   Wt 188 lb 12.8 oz (85.6 kg)   SpO2 96%   BMI 36.87 kg/m   Physical Examination:  General: Awake, alert, well nourished, No acute distress HEENT: Normal; sclera white.  Moist mucous membranes Cardio: regular rate and rhythm, S1S2 heard, no murmurs appreciated Pulm: clear to auscultation bilaterally, no wheezes, rhonchi or rales; normal work of breathing on room air Psych: Patient is pleasant, interactive.  Mood is stable.  Depression screen [facility] 2/9 04/04/2021 04/01/2021 04/01/2021  Decreased Interest 1 2 2   Down, Depressed, Hopeless 1 1 1   PHQ - 2 Score 2 3 3   Altered sleeping 3 2 2   Tired, decreased energy 3 3 3   Change in appetite 0 1 1  Feeling bad or failure about yourself  1 1 1   Trouble concentrating 0 0 0  Moving slowly or fidgety/restless 3 1 1   Suicidal thoughts 0 0 0  PHQ-9 Score 12 11 11   Difficult doing work/chores Somewhat difficult Somewhat difficult Somewhat difficult  Some recent data might be hidden   GAD 7 : Generalized Anxiety Score 04/04/2021 12/20/2020 10/15/2020 03/06/2020  Nervous, Anxious, on Edge 0 1 0 0  Control/stop worrying 3 3 0 1  Worry too much - different things 3 3 0 0  Trouble relaxing 3 3 2  0  Restless 0 1 0 0  Easily annoyed or irritable 0 1 1 0  Afraid - awful might happen 1 3 0 0  Total GAD 7 Score  10 15 3 1   Anxiety Difficulty Somewhat difficult Somewhat difficult - Somewhat difficult    Assessment/ Plan: 70 y.o. female   Uncontrolled type 2 diabetes mellitus with hyperglycemia (HCC) - Plan: Bayer DCA Hb A1c Waived, BMP8+EGFR, Hemoglobin A1c, DISCONTINUED: Semaglutide (RYBELSUS) 7 MG TABS  Hyperlipidemia associated with type 2 diabetes mellitus ([place])  Hypertension associated with diabetes ([place])  Generalized anxiety disorder with panic attacks  Depressive disorder  Asymptomatic postmenopausal estrogen deficiency - Plan: DG [facility] DEXA  Sugar has increased quite a bit.  A1c now up to 11.7.  This is not consistent with the sugars that she is reporting to me today.  I have for this reason send off a serum A1c to be processed at the lab.  Anticipate advancing Rybelsus to 14 mg.  She is had no side effects from the 7 mg.  Continue on Farxiga, glipizide as prescribed.  Diabetic eye exam was performed in office today.  Would like her to follow-up with our clinical pharmacist within the next couple of weeks for patient assistance, CGM.  I really want her to have a CGM over point-of-care to ensure accuracy of sugars and because she has poor vision. DM retinal exam obtained with [facility] today  Continue statin therapy  Blood pressure not at goal.  May need to advance Norvasc 10 mg daily.  Would like to have this level rechecked at her follow-up with [facility] and depression are still pretty present in this patient despite multiple medications.  We will reach out to virtual behavioral health to see if perhaps he may be of assistance  DEXA scan ordered today  Orders Placed This Encounter  Procedures   Bayer DCA Hb A1c Waived   BMP8+EGFR   No orders of the defined types were placed in this encounter.    [name], DO [place] [phone]

## 2021-04-04 NOTE — Patient Instructions (Signed)
I am going to increase your Rybelsus to '14mg'$ .  Your sugar is still showing high but it doesn't make sense since your sugar checks are sounding good.  I'm going to do a send off A1c to confirm that this measurement today is accurate.

## 2021-04-05 LAB — HEMOGLOBIN A1C
Est. average glucose Bld gHb Est-mCnc: 292 mg/dL
Hgb A1c MFr Bld: 11.8 % — ABNORMAL HIGH (ref 4.8–5.6)

## 2021-04-08 ENCOUNTER — Other Ambulatory Visit: Payer: Self-pay | Admitting: Family Medicine

## 2021-04-08 DIAGNOSIS — Z1231 Encounter for screening mammogram for malignant neoplasm of breast: Secondary | ICD-10-CM

## 2021-04-09 ENCOUNTER — Ambulatory Visit: Payer: Self-pay | Admitting: Pharmacist

## 2021-04-09 DIAGNOSIS — E1165 Type 2 diabetes mellitus with hyperglycemia: Secondary | ICD-10-CM

## 2021-04-10 ENCOUNTER — Telehealth: Payer: Self-pay | Admitting: Family Medicine

## 2021-04-10 NOTE — Progress Notes (Signed)
Chronic Care Management Pharmacy Note  04/11/2021 Name:  Donna Rogers MRN:  [id number] DOB:  [birth year]  Summary: diabetes  Recommendations/Changes made from today's visit: Diabetes: Uncontrolled; current treatment:RYBELSUS, JARDIANCE, GLIPIZIDE;  GFR 66, A1C 9.5% WILL TRANSITION JARDIANCE TO FARXIGA (EASE OF PATIENT ASSISTANCE)--SAMPLES GIVEN APPLICATION SUBMITTED TO AZ&ME FOR FARXIGA MEDICATION TO SHIP TO PATIENT'S HOME MAY HAVE TO INCREASE RYBELSUS PENDING Bgs APPLICATION SUBMITTED FOR NOVO Donna Rogers (RYBELSUS) Medication to ship to PCP office Current glucose readings: fasting glucose: <160 , post prandial glucose: 200s Denies hypoglycemic/hyperglycemic symptoms Discussed meal planning options and Plate method for healthy eating Avoid sugary drinks and desserts Incorporate balanced protein, non starchy veggies, 1 serving of carbohydrate with each meal Increase water intake Increase physical activity as able Current exercise: N/A Educated on TRANSITION TO [place] patient finances. WILL APPLY FOR AZ&ME (FARXIGA) AND NOVO [place] PATIENT ASSISTANCE PROGRAMS FOR 2022--all info faxed  Follow Up Plan: Telephone follow up appointment with care management team member scheduled for: 2-3 WEEKS   Subjective: Donna Rogers is an 70 y.o. year old female who is a primary patient of [name], DO.  The CCM team was consulted for assistance with disease management and care coordination needs.    Engaged with patient by telephone for follow up visit in response to provider referral for pharmacy case management and/or care coordination services.   Consent to Services:  The patient was given information about Chronic Care Management services, agreed to services, and gave verbal consent prior to initiation of services.  Please see initial visit note for detailed documentation.   Patient Care Team: [name], DO as PCP - General (Family  Medicine) Daneil Dolin, MD as Consulting Physician (Gastroenterology) Katha Cabal, [name] as Social Worker (Licensed Clinical Social Worker) Blanca Friend, Royce Macadamia, [facility] (Pharmacist) Lavera Guise, [facility] as Pharmacist (Family Medicine)   Objective:  Lab Results  Component Value Date   CREATININE 0.78 04/04/2021   CREATININE 0.94 10/15/2020   CREATININE 0.98 09/08/2019    Lab Results  Component Value Date   HGBA1C 11.8 (H) 04/04/2021   Last diabetic Eye exam: No results found for: HMDIABEYEEXA  Last diabetic Foot exam: No results found for: HMDIABFOOTEX      Component Value Date/Time   CHOL 123 10/15/2020 1134   TRIG 184 (H) 10/15/2020 1134   HDL 29 (L) 10/15/2020 1134   CHOLHDL 4.2 10/15/2020 1134   [place] 63 10/15/2020 1134    Hepatic Function Latest Ref Rng & Units 10/15/2020 12/28/2018 08/03/2018  Total Protein 6.0 - 8.5 g/dL 6.9 7.3 6.8  Albumin 3.8 - 4.8 g/dL 4.0 4.3 4.1  AST 0 - 40 IU/L 16 33 34  ALT 0 - 32 IU/L 19 32 50(H)  Alk Phosphatase 44 - 121 IU/L 94 89 99  Total Bilirubin 0.0 - 1.2 mg/dL 0.3 0.4 0.2    Lab Results  Component Value Date/Time   TSH 1.790 07/03/2019 02:28 PM   TSH 0.706 08/03/2018 01:43 PM    CBC Latest Ref Rng & Units 10/15/2020 08/29/2019 08/03/2018  WBC 3.4 - 10.8 x10E3/uL 8.8 9.4 8.0  Hemoglobin 11.1 - 15.9 g/dL 14.7 14.2 14.3  Hematocrit 34.0 - 46.6 % 45.4 43.9 41.2  Platelets 150 - 450 x10E3/uL 269 237 242    No results found for: VD25OH  Clinical ASCVD: No  The ASCVD Risk score Mikey Bussing DC Jr., et al., 2013) failed to calculate for the following reasons:   The valid total  cholesterol range is 130 to 320 mg/dL    Other: (CHADS2VASc if Afib, PHQ9 if depression, MMRC or CAT for COPD, ACT, DEXA)  Social History   Tobacco Use  Smoking Status Every Day   Packs/day: 1.00   Years: 46.00   Pack years: 46.00   Types: Cigarettes  Smokeless Tobacco Never   BP Readings from Last 3 Encounters:  04/04/21 (!) 164/111  04/03/21 (!)  150/84  12/20/20 133/82   Pulse Readings from Last 3 Encounters:  04/04/21 81  04/03/21 80  12/20/20 82   Wt Readings from Last 3 Encounters:  04/04/21 188 lb 12.8 oz (85.6 kg)  04/03/21 188 lb (85.3 kg)  12/20/20 196 lb 9.6 oz (89.2 kg)    Assessment: Review of patient past medical history, allergies, medications, health status, including review of consultants reports, laboratory and other test data, was performed as part of comprehensive evaluation and provision of chronic care management services.   SDOH:  (Social Determinants of Health) assessments and interventions performed:    CCM Care Plan  Allergies  Allergen Reactions   Ciprofloxacin Hcl Rash   Metformin And Related Diarrhea    Medications Reviewed Today     Reviewed by [name], DO (Physician) on 04/04/21 at 1056  Med List Status: <None>   Medication Order Taking? Sig Documenting Provider Last Dose Status Informant  Alpha-Lipoic Acid 600 MG CAPS [id number] Yes Take 1 capsule (600 mg total) by mouth daily. For neuropathy [name], DO Taking Active   amLODipine (NORVASC) 5 MG tablet [id number] Yes TAKE ONE TABLET BY MOUTH EVERY DAY [name], DO Taking Active   aspirin 81 MG tablet [id number] Yes Take 81 mg by mouth daily. [provider] Taking Active Self  atenolol (TENORMIN) 25 MG tablet [id number] Yes TAKE 1 TABLET BY MOUTH DAILY. Ronnie Doss M, DO Taking Active   azelastine (OPTIVAR) 0.05 % ophthalmic solution [id number] Yes in the morning and at bedtime. [provider] Taking Active   budesonide (ENTOCORT EC) 3 MG 24 hr capsule [id number] Yes Take 3 capsules (9 mg total) by mouth daily. [name], [name], PA-C Taking Active   buPROPion (WELLBUTRIN XL) 300 MG 24 hr tablet [id number] Yes TAKE 1 TABLET DAILY Ronnie Doss M, DO Taking Active   busPIRone (BUSPAR) 10 MG tablet [id number] Yes TAKE (1) TABLET TWICE A DAY. Ronnie Doss M, DO Taking Active    cholecalciferol (VITAMIN D) 1000 UNITS tablet [id number] Yes Take 1,000 Units by mouth daily. [provider] Taking Active Self  citalopram (CELEXA) 40 MG tablet [id number] Yes TAKE 1 TABLET EVERY DAY (Needs to be seen) [name], DO Taking Active   Cyanocobalamin (VITAMIN B 12 PO) [id number] Yes Take by mouth. [provider] Taking Active   dapagliflozin propanediol (FARXIGA) 10 MG TABS tablet [id number] Yes Take 1 tablet (10 mg total) by mouth daily before breakfast. Ronnie Doss M, DO Taking Active   diclofenac (VOLTAREN) 75 MG EC tablet [id number] Yes TAKE ONE TABLET BY MOUTH TWICE DAILY AS NEEDED FOR MODERATE PAIN (USE SPARINGLY) Ronnie Doss M, DO Taking Active   diphenoxylate-atropine (LOMOTIL) 2.5-0.025 MG tablet [id number] Yes TAKE 1 OR 2 TABLETS FOUR TIMES DAILY AS NEEDED Gatha Mayer, MD Taking Active   folic acid (FOLVITE) 1 MG tablet [id number] Yes Take 1 mg by mouth daily. [provider] Taking Active   gabapentin (NEURONTIN) 600 MG tablet [id number] Yes TAKE TWO TABLETS BY MOUTH THREE TIMES DAILY [name], DO  Taking Active   glipiZIDE (GLUCOTROL) 10 MG tablet [id number] Yes TAKE (1) TABLET TWICE A DAY BEFORE MEALS. Delynn Flavin M, DO Taking Active   HYDROcodone-acetaminophen (NORCO/VICODIN) 5-325 MG per tablet [id number] Yes Take 1 tablet by mouth every 6 (six) hours as needed. Deatra Canter, FNP Taking Active Self  losartan (COZAAR) 100 MG tablet [id number] Yes TAKE 1 TABLET DAILY Delynn Flavin M, DO Taking Active   methotrexate (RHEUMATREX) 2.5 MG tablet [id number] Yes Take 15 mg by mouth once a week. [provider] Taking Active   omeprazole (PRILOSEC) 20 MG capsule [id number] Yes Take 1 capsule (20 mg total) by mouth daily. Please cancel protonix Raliegh Ip, DO Taking Active   Pyridoxine HCl (VITAMIN B-6 PO) [id number] Yes Take by mouth. [provider] Taking Active   Semaglutide (RYBELSUS)  7 MG TABS [id number] Yes Take 7 mg by mouth daily. Delynn Flavin M, DO Taking Active   simvastatin (ZOCOR) 40 MG tablet [id number] Yes TAKE 1 TABLET DAILY Raliegh Ip, DO Taking Active   SULFASALAZINE PO [id number] Yes Take by mouth. [provider] Taking Active   vitamin C (ASCORBIC ACID) 500 MG tablet [id number] Yes Take 500 mg by mouth daily.  [provider] Taking Active Self  zolpidem (AMBIEN) 5 MG tablet [id number] Yes Take 1 tablet at bedtime as needed for sleep (use sparingly). Raliegh Ip, DO Taking Active             Patient Active Problem List   Diagnosis Date Noted   Carpal tunnel syndrome of right wrist 07/17/2020   Dysphagia 03/27/2020   Generalized anxiety disorder with panic attacks 09/01/2019   Hyperlipidemia associated with type 2 diabetes mellitus (HCC) 12/28/2018   Chronic pain of right knee 12/28/2018   Tobacco abuse 04/10/2016   Lymphocytic colitis 12/03/2015   DM2 (diabetes mellitus, type 2) (HCC) 10/11/2015   Depressive disorder 06/17/2015   Fatigue 06/17/2015   Weakness 06/03/2015   Diarrhea 05/13/2015   Hypertension associated with diabetes (HCC)    OBESITY 01/21/2009   EROSIVE ESOPHAGITIS 01/21/2009   GASTROESOPHAGEAL REFLUX DISEASE, CHRONIC 01/21/2009   Rheumatoid arthritis (HCC) 01/21/2009   TENDINITIS 01/21/2009   EDEMA 01/21/2009    Immunization History  Administered Date(s) Administered   Fluad Quad(high Dose 65+) 05/03/2019   Influenza,inj,Quad PF,6+ Mos 05/25/2013, 07/31/2015, 06/18/2016, 05/16/2018   Influenza-Unspecified 05/07/2019, 07/24/2020   PPD Test 05/08/2013   Pneumococcal Conjugate-13 05/16/2018   Pneumococcal Polysaccharide-23 07/03/2019   Tdap 12/28/2018   Zoster Recombinat (Shingrix) 06/06/2019, 06/30/2019, 10/27/2019    Conditions to be addressed/monitored: DMII  Care Plan : PHARMD MEDICATION MANAGEMENT  Updates made by Danella Maiers, RPH since 04/10/2021 12:00 AM     Problem:  DISEASE PROGRESSION PREVENTION      Long-Range Goal: T2DM   Recent Progress: Not on track  Priority: High  Note:   Current Barriers:  Unable to independently afford treatment regimen Unable to achieve control of T2DM   Pharmacist Clinical Goal(s):  Over the next 90 days, patient will verbalize ability to afford treatment regimen achieve control of T2DM as evidenced by IMPROVED GLYCEMIC CONTROL, GOAL A1C<7%  through collaboration with PharmD and provider.    Interventions: 1:1 collaboration with Raliegh Ip, DO regarding development and update of comprehensive plan of care as evidenced by provider attestation and co-signature Inter-disciplinary care team collaboration (see longitudinal plan of care) Comprehensive medication review performed; medication list updated in electronic medical record  Diabetes: Uncontrolled; current treatment:RYBELSUS, JARDIANCE, GLIPIZIDE;  GFR 66, A1C 9.5% WILL TRANSITION JARDIANCE TO FARXIGA (EASE OF PATIENT ASSISTANCE)--SAMPLES GIVEN APPLICATION SUBMITTED TO AZ&ME FOR FARXIGA MEDICATION TO SHIP TO PATIENT'S HOME MAY HAVE TO INCREASE RYBELSUS PENDING Bgs APPLICATION SUBMITTED FOR NOVO [place] (RYBELSUS) Medication to ship to PCP office Current glucose readings: fasting glucose: <160 , post prandial glucose: 200s Denies hypoglycemic/hyperglycemic symptoms Discussed meal planning options and Plate method for healthy eating Avoid sugary drinks and desserts Incorporate balanced protein, non starchy veggies, 1 serving of carbohydrate with each meal Increase water intake Increase physical activity as able Current exercise: N/A Educated on TRANSITION TO [place] patient finances. WILL APPLY FOR AZ&ME (FARXIGA) AND NOVO [place] PATIENT ASSISTANCE PROGRAMS FOR 2022 --all info faxed   Patient Goals/Self-Care Activities Over the next 90 days, patient will:  - take medications as prescribed collaborate with provider on medication  access solutions  Follow Up Plan: Telephone follow up appointment with care management team member scheduled for: 2-3 WEEKS      Medication Assistance:  applications submitted to [facility] for rybelsus and az&me for farxiga  Patient's preferred pharmacy is:  [place], [place] [address] Phone: [phone] Fax: [place], [place] [address] [place] [address] Phone: [phone] Fax: [phone]  Follow Up:  Patient agrees to Care Plan and Follow-up.  Plan: Telephone follow up appointment with care management team member scheduled for:  3 weeks    Regina Eck, PharmD, BCPS Clinical Pharmacist, [place]  [facility] Phone [phone]

## 2021-04-10 NOTE — Telephone Encounter (Signed)
Appt made with Almyra Free

## 2021-04-10 NOTE — Telephone Encounter (Signed)
Patient aware and verbalized understanding. °

## 2021-04-10 NOTE — Patient Instructions (Signed)
Visit Information  PATIENT GOALS:  Goals Addressed               This Visit's Progress     Patient Stated     T2DM PHARMD GOAL (pt-stated)        Current Barriers:  Unable to independently afford treatment regimen Unable to achieve control of T2DM   Pharmacist Clinical Goal(s):  Over the next 90 days, patient will verbalize ability to afford treatment regimen achieve control of T2DM as evidenced by IMPROVED GLYCEMIC CONTROL, GOAL A1C<7% through collaboration with PharmD and provider.    Interventions: 1:1 collaboration with [name], DO regarding development and update of comprehensive plan of care as evidenced by provider attestation and co-signature Inter-disciplinary care team collaboration (see longitudinal plan of care) Comprehensive medication review performed; medication list updated in electronic medical record  Diabetes: Uncontrolled; current treatment:RYBELSUS, JARDIANCE, GLIPIZIDE;  GFR 66, A1C 9.5% WILL TRANSITION JARDIANCE TO [facility] (EASE OF PATIENT ASSISTANCE)--SAMPLES GIVEN APPLICATION SUBMITTED TO [facility] FOR [place] PENDING Bgs APPLICATION SUBMITTED FOR [facility] [place] [facility] (RYBELSUS) Medication to ship to PCP office Current glucose readings: fasting glucose: <160 , post prandial glucose: 200s Denies hypoglycemic/hyperglycemic symptoms Discussed meal planning options and Plate method for healthy eating Avoid sugary drinks and desserts Incorporate balanced protein, non starchy veggies, 1 serving of carbohydrate with each meal Increase water intake Increase physical activity as able Current exercise: N/A Educated on TRANSITION TO [place] patient finances. WILL APPLY FOR [facility] ([facility]) AND [facility] [place] PATIENT ASSISTANCE PROGRAMS FOR 2022--all info faxed   Patient Goals/Self-Care Activities Over the next 90 days, patient will:  - take medications as  prescribed collaborate with provider on medication access solutions  Follow Up Plan: Telephone follow up appointment with care management team member scheduled for: 2-3 WEEKS         The patient verbalized understanding of instructions, educational materials, and care plan provided today and declined offer to receive copy of patient instructions, educational materials, and care plan.   Telephone follow up appointment with care management team member scheduled for: 3 weeks  Signature Regina Eck, PharmD, BCPS Clinical Pharmacist, [facility]  [facility] Phone [phone]

## 2021-04-15 ENCOUNTER — Ambulatory Visit (INDEPENDENT_AMBULATORY_CARE_PROVIDER_SITE_OTHER): Payer: Medicare Other

## 2021-04-15 VITALS — Ht 60.0 in | Wt 188.0 lb

## 2021-04-15 DIAGNOSIS — F17210 Nicotine dependence, cigarettes, uncomplicated: Secondary | ICD-10-CM

## 2021-04-15 DIAGNOSIS — Z0000 Encounter for general adult medical examination without abnormal findings: Secondary | ICD-10-CM

## 2021-04-15 DIAGNOSIS — R296 Repeated falls: Secondary | ICD-10-CM

## 2021-04-15 DIAGNOSIS — M1991 Primary osteoarthritis, unspecified site: Secondary | ICD-10-CM | POA: Insufficient documentation

## 2021-04-15 DIAGNOSIS — Z0001 Encounter for general adult medical examination with abnormal findings: Secondary | ICD-10-CM

## 2021-04-15 DIAGNOSIS — R531 Weakness: Secondary | ICD-10-CM

## 2021-04-15 DIAGNOSIS — M5136 Other intervertebral disc degeneration, lumbar region: Secondary | ICD-10-CM | POA: Insufficient documentation

## 2021-04-15 DIAGNOSIS — M8580 Other specified disorders of bone density and structure, unspecified site: Secondary | ICD-10-CM | POA: Insufficient documentation

## 2021-04-15 NOTE — Patient Instructions (Signed)
Donna Rogers , Thank you for taking time to come for your Medicare Wellness Visit. I appreciate your ongoing commitment to your health goals. Please review the following plan we discussed and let me know if I can assist you in the future.   Screening recommendations/referrals: Colonoscopy: Done 10/21/2015 - Repeat in 10 years Mammogram: Keep appointment for November - repeat annually Bone Density: Done 04/04/2021 - Repeat every 2 years Recommended yearly ophthalmology/optometry visit for glaucoma screening and checkup Recommended yearly dental visit for hygiene and checkup  Vaccinations: Influenza vaccine: Done 07/24/2020 - Repeat annually Pneumococcal vaccine: Done 05/16/2018 & 07/03/2019 Tdap vaccine: Done 12/28/2018 -  Repeat in 10 years  Shingles vaccine: Done 06/30/2019 & 10/27/2019   Covid-19: Declined  Advanced directives: Advance directive discussed with you today. I have provided a copy for you to complete at home and have notarized. Once this is complete please bring a copy in to our office so we can scan it into your chart.   Conditions/risks identified: Aim for 30 minutes of exercise each day, work on fall prevention, drink 6-8 glasses of water and eat lots of fruits and vegetables.   Next appointment: Follow up in one year for your annual wellness visit    Preventive Care 65 Years and Older, Female Preventive care refers to lifestyle choices and visits with your health care provider that can promote health and wellness. What does preventive care include? A yearly physical exam. This is also called an annual well check. Dental exams once or twice a year. Routine eye exams. Ask your health care provider how often you should have your eyes checked. Personal lifestyle choices, including: Daily care of your teeth and gums. Regular physical activity. Eating a healthy diet. Avoiding tobacco and drug use. Limiting alcohol use. Practicing safe sex. Taking low-dose aspirin every  day. Taking vitamin and mineral supplements as recommended by your health care provider. What happens during an annual well check? The services and screenings done by your health care provider during your annual well check will depend on your age, overall health, lifestyle risk factors, and family history of disease. Counseling  Your health care provider may ask you questions about your: Alcohol use. Tobacco use. Drug use. Emotional well-being. Home and relationship well-being. Sexual activity. Eating habits. History of falls. Memory and ability to understand (cognition). Work and work Statistician. Reproductive health. Screening  You may have the following tests or measurements: Height, weight, and BMI. Blood pressure. Lipid and cholesterol levels. These may be checked every 5 years, or more frequently if you are over 36 years old. Skin check. Lung cancer screening. You may have this screening every year starting at age 30 if you have a 30-pack-year history of smoking and currently smoke or have quit within the past 15 years. Fecal occult blood test (FOBT) of the stool. You may have this test every year starting at age 32. Flexible sigmoidoscopy or colonoscopy. You may have a sigmoidoscopy every 5 years or a colonoscopy every 10 years starting at age 63. Hepatitis C blood test. Hepatitis B blood test. Sexually transmitted disease (STD) testing. Diabetes screening. This is done by checking your blood sugar (glucose) after you have not eaten for a while (fasting). You may have this done every 1-3 years. Bone density scan. This is done to screen for osteoporosis. You may have this done starting at age 24. Mammogram. This may be done every 1-2 years. Talk to your health care provider about how often you should have regular  mammograms. Talk with your health care provider about your test results, treatment options, and if necessary, the need for more tests. Vaccines  Your health care  provider may recommend certain vaccines, such as: Influenza vaccine. This is recommended every year. Tetanus, diphtheria, and acellular pertussis (Tdap, Td) vaccine. You may need a Td booster every 10 years. Zoster vaccine. You may need this after age 53. Pneumococcal 13-valent conjugate (PCV13) vaccine. One dose is recommended after age 47. Pneumococcal polysaccharide (PPSV23) vaccine. One dose is recommended after age 75. Talk to your health care provider about which screenings and vaccines you need and how often you need them. This information is not intended to replace advice given to you by your health care provider. Make sure you discuss any questions you have with your health care provider. Document Released: 08/30/2015 Document Revised: 04/22/2016 Document Reviewed: 06/04/2015 Elsevier Interactive Patient Education  2017 [place] Prevention in the Home Falls can cause injuries. They can happen to people of all ages. There are many things you can do to make your home safe and to help prevent falls. What can I do on the outside of my home? Regularly fix the edges of walkways and driveways and fix any cracks. Remove anything that might make you trip as you walk through a door, such as a raised step or threshold. Trim any bushes or trees on the path to your home. Use bright outdoor lighting. Clear any walking paths of anything that might make someone trip, such as rocks or tools. Regularly check to see if handrails are loose or broken. Make sure that both sides of any steps have handrails. Any raised decks and porches should have guardrails on the edges. Have any leaves, snow, or ice cleared regularly. Use sand or salt on walking paths during winter. Clean up any spills in your garage right away. This includes oil or grease spills. What can I do in the bathroom? Use night lights. Install grab bars by the toilet and in the tub and shower. Do not use towel bars as grab  bars. Use non-skid mats or decals in the tub or shower. If you need to sit down in the shower, use a plastic, non-slip stool. Keep the floor dry. Clean up any water that spills on the floor as soon as it happens. Remove soap buildup in the tub or shower regularly. Attach bath mats securely with double-sided non-slip rug tape. Do not have throw rugs and other things on the floor that can make you trip. What can I do in the bedroom? Use night lights. Make sure that you have a light by your bed that is easy to reach. Do not use any sheets or blankets that are too big for your bed. They should not hang down onto the floor. Have a firm chair that has side arms. You can use this for support while you get dressed. Do not have throw rugs and other things on the floor that can make you trip. What can I do in the kitchen? Clean up any spills right away. Avoid walking on wet floors. Keep items that you use a lot in easy-to-reach places. If you need to reach something above you, use a strong step stool that has a grab bar. Keep electrical cords out of the way. Do not use floor polish or wax that makes floors slippery. If you must use wax, use non-skid floor wax. Do not have throw rugs and other things on the floor that can  make you trip. What can I do with my stairs? Do not leave any items on the stairs. Make sure that there are handrails on both sides of the stairs and use them. Fix handrails that are broken or loose. Make sure that handrails are as long as the stairways. Check any carpeting to make sure that it is firmly attached to the stairs. Fix any carpet that is loose or worn. Avoid having throw rugs at the top or bottom of the stairs. If you do have throw rugs, attach them to the floor with carpet tape. Make sure that you have a light switch at the top of the stairs and the bottom of the stairs. If you do not have them, ask someone to add them for you. What else can I do to help prevent  falls? Wear shoes that: Do not have high heels. Have rubber bottoms. Are comfortable and fit you well. Are closed at the toe. Do not wear sandals. If you use a stepladder: Make sure that it is fully opened. Do not climb a closed stepladder. Make sure that both sides of the stepladder are locked into place. Ask someone to hold it for you, if possible. Clearly mark and make sure that you can see: Any grab bars or handrails. First and last steps. Where the edge of each step is. Use tools that help you move around (mobility aids) if they are needed. These include: Canes. Walkers. Scooters. Crutches. Turn on the lights when you go into a dark area. Replace any light bulbs as soon as they burn out. Set up your furniture so you have a clear path. Avoid moving your furniture around. If any of your floors are uneven, fix them. If there are any pets around you, be aware of where they are. Review your medicines with your doctor. Some medicines can make you feel dizzy. This can increase your chance of falling. Ask your doctor what other things that you can do to help prevent falls. This information is not intended to replace advice given to you by your health care provider. Make sure you discuss any questions you have with your health care provider. Document Released: 05/30/2009 Document Revised: 01/09/2016 Document Reviewed: 09/07/2014 Elsevier Interactive Patient Education  2017 [employer].

## 2021-04-15 NOTE — Progress Notes (Signed)
Subjective:   Donna Rogers is a 70 y.o. female who presents for Medicare Annual (Subsequent) preventive examination.  Virtual Visit via Telephone Note  I connected with  Donna Rogers on 04/15/21 at  3:30 PM EDT by telephone and verified that I am speaking with the correct person using two identifiers.  Location: Patient: Home Provider: [facility] Persons participating in the virtual visit: patient/Nurse Health Advisor   I discussed the limitations, risks, security and privacy concerns of performing an evaluation and management service by telephone and the availability of in person appointments. The patient expressed understanding and agreed to proceed.  Interactive audio and video telecommunications were attempted between this nurse and patient, however failed, due to patient having technical difficulties OR patient did not have access to video capability.  We continued and completed visit with audio only.  Some vital signs may be absent or patient reported.   Donna Rogers E Donna Memon, LPN   Review of Systems     Cardiac Risk Factors include: advanced age (>56mn, >>110women);diabetes mellitus;dyslipidemia;hypertension;obesity (BMI >30kg/m2);sedentary lifestyle;smoking/ tobacco exposure;Other (see comment), Risk factor comments: atherosclerosis     Objective:    Today's Vitals   04/15/21 1547 04/15/21 1549  Weight: 188 lb (85.3 kg)   Height: 5' (1.524 m)   PainSc:  10-Worst pain ever   Body mass index is 36.72 kg/m.  Advanced Directives 04/15/2021 10/31/2019 08/29/2019 07/27/2018 03/03/2016 02/03/2016 10/21/2015  Does Patient Have a Medical Advance Directive? No No No No No No No  Would patient like information on creating a medical advance directive? Yes (MAU/Ambulatory/Procedural Areas - Information given) No - Patient declined - - No - patient declined information Yes - Educational materials given Yes - Educational materials given    Current Medications (verified) Outpatient  Encounter Medications as of 04/15/2021  Medication Sig   Alpha-Lipoic Acid 600 MG CAPS Take 1 capsule (600 mg total) by mouth daily. For neuropathy   amLODipine (NORVASC) 5 MG tablet TAKE ONE TABLET BY MOUTH EVERY DAY   Ascorbic Acid (VITAMIN C) 500 MG CHEW 1 tablet   aspirin 81 MG tablet Take 81 mg by mouth daily.   atenolol (TENORMIN) 25 MG tablet TAKE 1 TABLET BY MOUTH DAILY.   budesonide (ENTOCORT EC) 3 MG 24 hr capsule Take 3 capsules (9 mg total) by mouth daily.   buPROPion (WELLBUTRIN XL) 300 MG 24 hr tablet TAKE 1 TABLET DAILY   busPIRone (BUSPAR) 10 MG tablet TAKE (1) TABLET TWICE A DAY.   cholecalciferol (VITAMIN D) 1000 UNITS tablet Take 1,000 Units by mouth daily.   citalopram (CELEXA) 40 MG tablet TAKE 1 TABLET EVERY DAY (Needs to be seen)   Cyanocobalamin (VITAMIN B 12 PO) Take by mouth.   dapagliflozin propanediol (FARXIGA) 10 MG TABS tablet Take 1 tablet (10 mg total) by mouth daily before breakfast.   diclofenac (VOLTAREN) 75 MG EC tablet TAKE ONE TABLET BY MOUTH TWICE DAILY AS NEEDED FOR MODERATE PAIN (USE SPARINGLY)   diphenoxylate-atropine (LOMOTIL) 2.5-0.025 MG tablet TAKE 1 OR 2 TABLETS FOUR TIMES DAILY AS NEEDED   folic acid (FOLVITE) 1 MG tablet Take 1 mg by mouth daily.   gabapentin (NEURONTIN) 600 MG tablet TAKE TWO TABLETS BY MOUTH THREE TIMES DAILY   HYDROcodone-acetaminophen (NORCO/VICODIN) 5-325 MG per tablet Take 1 tablet by mouth every 6 (six) hours as needed.   losartan (COZAAR) 100 MG tablet TAKE 1 TABLET DAILY   methotrexate (RHEUMATREX) 2.5 MG tablet Take 15 mg by mouth once a week.  omeprazole (PRILOSEC) 20 MG capsule Take 1 capsule (20 mg total) by mouth daily. Please cancel protonix   Oyster Shell Calcium 500 MG TABS 1 tablet with meals   Pyridoxine HCl (VITAMIN B-6 PO) Take by mouth.   Semaglutide (RYBELSUS) 14 MG TABS Take 14 mg by mouth daily.   simvastatin (ZOCOR) 40 MG tablet TAKE 1 TABLET DAILY   SULFASALAZINE PO Take by mouth.   zolpidem  (AMBIEN) 5 MG tablet Take 1 tablet at bedtime as needed for sleep (use sparingly).   azelastine (OPTIVAR) 0.05 % ophthalmic solution in the morning and at bedtime.   glipiZIDE (GLUCOTROL) 10 MG tablet TAKE (1) TABLET TWICE A DAY BEFORE MEALS. (Patient not taking: Reported on 04/15/2021)   [DISCONTINUED] buPROPion (WELLBUTRIN XL) 150 MG 24 hr tablet    [DISCONTINUED] busPIRone (BUSPAR) 5 MG tablet    [DISCONTINUED] diclofenac (VOLTAREN) 75 MG EC tablet 1 tablet with food or milk   [DISCONTINUED] gabapentin (NEURONTIN) 600 MG tablet 2 capsule before bedtime   [DISCONTINUED] omeprazole (PRILOSEC) 20 MG capsule 1 capsule   [DISCONTINUED] simvastatin (ZOCOR) 10 MG tablet 1 tablet in the evening   [DISCONTINUED] vitamin C (ASCORBIC ACID) 500 MG tablet Take 500 mg by mouth daily.    No facility-administered encounter medications on file as of 04/15/2021.    Allergies (verified) Ciprofloxacin hcl, Enbrel [etanercept], and Metformin and related   History: Past Medical History:  Diagnosis Date   Allergy    seasonal allergies   Anemia    hx of   Arthritis    rheumatoid   Depression    on meds   Diabetes (HCC)    Diabetic peripheral neuropathy (HCC)    GERD (gastroesophageal reflux disease)    on meds   Headache    Hyperlipidemia    on meds   Hyperplastic rectal polyp 10/21/2015   Hypertension    pt. denies   Lymphocytic colitis    Neuropathy    Rheumatoid arthritis ([place])    Shortness of breath dyspnea    with exertion   Past Surgical History:  Procedure Laterality Date   BREAST LUMPECTOMY Right 2018   BREAST LUMPECTOMY WITH RADIOACTIVE SEED LOCALIZATION Left 03/03/2016   Procedure: BREAST LUMPECTOMY WITH RADIOACTIVE SEED LOCALIZATION;  Surgeon: Coralie Keens, MD;  Location: [facility];  Service: General;  Laterality: Left;   COLONOSCOPY  08/2002   RMR: internal hemorrhoids   COLONOSCOPY N/A 10/21/2015   Procedure: COLONOSCOPY;  Surgeon: Daneil Dolin, MD;  [facility];   Service: Endoscopy;  Laterality: N/A;  250 - moved to 2:35 - office to notify pt   CYST REMOVAL TRUNK     ESOPHAGOGASTRODUODENOSCOPY  08/2002   RMR: nonerosive reflux esophagitis, hiatal hernia   TONSILLECTOMY     WISDOM TOOTH EXTRACTION     Family History  Problem Relation Age of Onset   Alzheimer's disease Mother    Lung cancer Mother 1   Diabetes Father    Heart disease Father    Arthritis Sister    Diabetes Sister    Diabetes Brother    Hypertension Brother    Colon cancer Neg Hx    Stomach cancer Neg Hx    Colon polyps Neg Hx    Esophageal cancer Neg Hx    Rectal cancer Neg Hx    Social History   Socioeconomic History   Marital status: Significant Other    Spouse name: Not on file   Number of children: 1   Years of  education: 11th   Highest education level: 11th grade  Occupational History   Occupation: retired  Tobacco Use   Smoking status: Every Day    Packs/day: 1.00    Years: 46.00    Pack years: 46.00    Types: Cigarettes   Smokeless tobacco: Never  Vaping Use   Vaping Use: Some days  Substance and Sexual Activity   Alcohol use: No    Alcohol/week: 0.0 standard drinks   Drug use: No   Sexual activity: Not Currently  Other Topics Concern   Not on file  Social History Narrative   The patient is divorced she has 1 son who is married that she has a grandchild.   She does not use alcohol or drugs she drinks 3-4 caffeinated beverages daily   She is a smoker   She lives with her boyfriend   Social Determinants of Radio broadcast assistant Strain: Medium Risk   Difficulty of Paying Living Expenses: Somewhat hard  Food Insecurity: No Food Insecurity   Worried About Charity fundraiser in the Last Year: Never true   Ran Out of Food in the Last Year: Never true  Transportation Needs: No Transportation Needs   Lack of Transportation (Medical): No   Lack of Transportation (Non-Medical): No  Physical Activity: Inactive   Days of Exercise per Week: 0  days   Minutes of Exercise per Session: 0 min  Stress: Not on file  Social Connections: Moderately Isolated   Frequency of Communication with Friends and Family: More than three times a week   Frequency of Social Gatherings with Friends and Family: More than three times a week   Attends Religious Services: Never   Marine scientist or Organizations: No   Attends Music therapist: Never   Marital Status: Living with partner    Tobacco Counseling Ready to quit: Not Answered Counseling given: Not Answered   Clinical Intake:  Pre-visit preparation completed: Yes  Pain : 0-10 Pain Score: 10-Worst pain ever Pain Type: Chronic pain Pain Location: Back Pain Descriptors / Indicators: Aching, Sharp, Discomfort Pain Onset: More than a month ago Pain Frequency: Intermittent     BMI - recorded: 36.72 Nutritional Risks: None Diabetes: Yes CBG done?: No Did pt. bring in CBG monitor from home?: No  How often do you need to have someone help you when you read instructions, pamphlets, or other written materials from your doctor or pharmacy?: 1 - Never  Diabetic? Nutrition Risk Assessment:  Has the patient had any N/V/D within the last 2 months?  No  Does the patient have any non-healing wounds?  No  Has the patient had any unintentional weight loss or weight gain?  No   Diabetes:  Is the patient diabetic?  Yes  If diabetic, was a CBG obtained today?  No  Did the patient bring in their glucometer from home?  No  How often do you monitor your CBG's? Once daily fasting - 143 this am per patient.   Financial Strains and Diabetes Management:  Are you having any financial strains with the device, your supplies or your medication?  Yes, but she is working with [facility] pharmacist - Almyra Free .  Does the patient want to be seen by Chronic Care Management for management of their diabetes?  No  Would the patient like to be referred to a Nutritionist or for Diabetic Management?   No   Diabetic Exams:  Diabetic Eye Exam: Completed 04/04/2021.   Diabetic  Foot Exam: Completed 10/15/2020. Pt has been advised about the importance in completing this exam. Pt is scheduled for diabetic foot exam on next year.    Interpreter Needed?: No  Information entered by :: Janely Gullickson, LPN   Activities of Daily Living In your present state of health, do you have any difficulty performing the following activities: 04/15/2021  Hearing? N  Vision? N  Difficulty concentrating or making decisions? Y  Walking or climbing stairs? Y  Dressing or bathing? N  Doing errands, shopping? Y  Comment her boyfriend drives her sometimes  Conservation officer, nature and eating ? N  Using the Toilet? N  In the past six months, have you accidently leaked urine? Y  Comment wears pads for protection  Do you have problems with loss of bowel control? N  Managing your Medications? N  Managing your Finances? N  Housekeeping or managing your Housekeeping? N  Some recent data might be hidden    Patient Care Team: [name], DO as PCP - General (Family Medicine) Shea Evans, Norva Riffle, [name] as Social Worker (Licensed Clinical Social Worker) Blanca Friend, Royce Macadamia, [facility] (Pharmacist) Lavera Guise, [facility] as Pharmacist (Family Medicine) Gatha Mayer, MD as Consulting Physician (Gastroenterology) [facility] (Specialist)  Indicate any recent Medical Services you may have received from other than [facility] providers in the past year (date may be approximate).     Assessment:   This is a routine wellness examination for Donna Rogers.  Hearing/Vision screen Hearing Screening - Comments:: Denies hearing difficulties  Vision Screening - Comments:: Wears eyeglasses - up to date with annual eye exams with Dr Marin Comment in [place]  Dietary issues and exercise activities discussed: Current Exercise Habits: The patient does not participate in regular exercise at present, Exercise limited by: orthopedic condition(s);cardiac  condition(s);respiratory conditions(s)   Goals Addressed             This Visit's Progress    Fall Prevention   Not on track    Current Barriers:  Knowledge Deficits related to fall precautions Decreased adherence to prescribed treatment for fall prevention  Nurse Case Manager Clinical Goal(s):  Over the next 30 days, patient will demonstrate improved adherence to prescribed treatment plan for decreasing falls as evidenced by patient reporting and review of EMR Over the next 14 days, patient will verbalize using fall risk reduction strategies discussed Over the next 90 days, patient will not experience additional falls  Interventions:  Provided written and verbal education re: Potential causes of falls and Fall prevention strategies Reviewed medications and discussed potential side effects of medications such as dizziness and frequent urination Assessed for s/s of orthostatic hypotension Has some lightheadedness with position change Assessed for falls since last encounter. Assessed patients knowledge of fall risk prevention secondary to previously provided education. Advised to move purposefully and change positions slowly  Patient Self Care Activities:  Utilize walker (assistive device) appropriately with all ambulation De-clutter walkways Change positions slowly Wear secure fitting shoes at all times with ambulation Utilize home lighting for dim lit areas Have self and pet awareness at all times  Plan: CCM RN CM will follow up in 30   Initial goal documentation      Quit Smoking   Not on track      Depression Screen PHQ 2/9 Scores 04/04/2021 04/01/2021 04/01/2021 01/08/2021 12/20/2020 10/15/2020 03/06/2020  PHQ - 2 Score '2 3 3 4 4 4 2  '$ PHQ- 9 Score '12 11 11 13 13 11 6    '$ Fall  Risk Fall Risk  04/15/2021 04/04/2021 12/20/2020 10/15/2020 02/12/2020  Falls in the past year? 1 1 0 1 1  Number falls in past yr: 1 0 - 1 0  Injury with Fall? 1 0 - 0 0  Comment possibly hurt  shoulder - - - -  Risk for fall due to : History of fall(s);Impaired balance/gait;Impaired vision;Medication side effect;Orthopedic patient History of fall(s) - History of fall(s) History of fall(s)  Follow up Education provided;Falls prevention discussed Falls evaluation completed - Falls prevention discussed Falls evaluation completed    FALL RISK PREVENTION PERTAINING TO THE HOME:  Any stairs in or around the home? Yes  If so, are there any without handrails? No  Home free of loose throw rugs in walkways, pet beds, electrical cords, etc? Yes  Adequate lighting in your home to reduce risk of falls? Yes   ASSISTIVE DEVICES UTILIZED TO PREVENT FALLS:  Life alert? No  Use of a cane, walker or w/c? Yes  Grab bars in the bathroom? Yes  Shower chair or bench in shower? Yes  Elevated toilet seat or a handicapped toilet? Yes   TIMED UP AND GO:  Was the test performed? No . Telephonic visit.  Cognitive Function:     6CIT Screen 04/15/2021 10/31/2019  What Year? 0 points 0 points  What month? 0 points 0 points  What time? 0 points 0 points  Count back from 20 0 points 0 points  Months in reverse 4 points 0 points  Repeat phrase 0 points 0 points  Total Score 4 0    Immunizations Immunization History  Administered Date(s) Administered   Fluad Quad(high Dose 65+) 05/03/2019   Influenza,inj,Quad PF,6+ Mos 05/25/2013, 07/31/2015, 06/18/2016, 05/16/2018   Influenza-Unspecified 05/07/2019, 07/24/2020   PPD Test 05/08/2013   Pneumococcal Conjugate-13 05/16/2018   Pneumococcal Polysaccharide-23 07/03/2019   Tdap 12/28/2018   Zoster Recombinat (Shingrix) 06/06/2019, 06/30/2019, 10/27/2019    TDAP status: Up to date  Flu Vaccine status: Up to date  Pneumococcal vaccine status: Up to date  Covid-19 vaccine status: Declined, Education has been provided regarding the importance of this vaccine but patient still declined. Advised may receive this vaccine at local pharmacy or Health  Dept.or vaccine clinic. Aware to provide a copy of the vaccination record if obtained from local pharmacy or Health Dept. Verbalized acceptance and understanding.  Qualifies for Shingles Vaccine? Yes   Zostavax completed No   Shingrix Completed?: Yes  Screening Tests Health Maintenance  Topic Date Due   OPHTHALMOLOGY EXAM  Never done   MAMMOGRAM  12/19/2017   INFLUENZA VACCINE  03/17/2021   COVID-19 Vaccine (1) 04/20/2021 (Originally 08/26/1955)   HEMOGLOBIN A1C  10/05/2021   FOOT EXAM  10/15/2021   COLONOSCOPY (Pts 45-77yr Insurance coverage will need to be confirmed)  10/20/2025   TETANUS/TDAP  12/27/2028   DEXA SCAN  Completed   Hepatitis C Screening  Completed   PNA vac Low Risk Adult  Completed   Zoster Vaccines- Shingrix  Completed   HPV VACCINES  Aged Out    Health Maintenance  Health Maintenance Due  Topic Date Due   OPHTHALMOLOGY EXAM  Never done   MAMMOGRAM  12/19/2017   INFLUENZA VACCINE  03/17/2021    Colorectal cancer screening: Type of screening: Colonoscopy. Completed 10/21/2015. Repeat every 10 years  Mammogram status: Ordered 03/2021. Pt provided with contact info and advised to call to schedule appt.  Has appt 06/23/21  Bone Density status: Completed 04/04/2021. Results reflect: Bone density results: OSTEOPENIA.  Repeat every 2 years.  Lung Cancer Screening: (Low Dose CT Chest recommended if Age 65-80 years, 30 pack-year currently smoking OR have quit w/in 15years.) does qualify.   Lung Cancer Screening Referral:  had done last March - sent new referral order  Additional Screening:  Hepatitis C Screening: does qualify; Completed 08/03/2018  Vision Screening: Recommended annual ophthalmology exams for early detection of glaucoma and other disorders of the eye. Is the patient up to date with their annual eye exam?  Yes  Who is the provider or what is the name of the office in which the patient attends annual eye exams? [facility] If pt is not established with a  provider, would they like to be referred to a provider to establish care? No .   Dental Screening: Recommended annual dental exams for proper oral hygiene  Community Resource Referral / Chronic Care Management: CRR required this visit?  Yes   CCM required this visit?  No      Plan:     I have personally reviewed and noted the following in the patient's chart:   Medical and social history Use of alcohol, tobacco or illicit drugs  Current medications and supplements including opioid prescriptions.  Functional ability and status Nutritional status Physical activity Advanced directives List of other physicians Hospitalizations, surgeries, and ER visits in previous 12 months Vitals Screenings to include cognitive, depression, and falls Referrals and appointments  In addition, I have reviewed and discussed with patient certain preventive protocols, quality metrics, and best practice recommendations. A written personalized care plan for preventive services as well as general preventive health recommendations were provided to patient.     Sandrea Hammond, LPN   [id number]   Nurse Notes: CRR done to see if patient can get assistance with Life alert for frequent falls.

## 2021-04-16 ENCOUNTER — Other Ambulatory Visit: Payer: Self-pay | Admitting: Family Medicine

## 2021-04-16 DIAGNOSIS — E1169 Type 2 diabetes mellitus with other specified complication: Secondary | ICD-10-CM | POA: Diagnosis not present

## 2021-04-16 DIAGNOSIS — E1159 Type 2 diabetes mellitus with other circulatory complications: Secondary | ICD-10-CM | POA: Diagnosis not present

## 2021-04-16 DIAGNOSIS — I10 Essential (primary) hypertension: Secondary | ICD-10-CM

## 2021-04-16 DIAGNOSIS — I152 Hypertension secondary to endocrine disorders: Secondary | ICD-10-CM

## 2021-04-16 DIAGNOSIS — E1165 Type 2 diabetes mellitus with hyperglycemia: Secondary | ICD-10-CM | POA: Diagnosis not present

## 2021-04-16 DIAGNOSIS — E119 Type 2 diabetes mellitus without complications: Secondary | ICD-10-CM | POA: Diagnosis not present

## 2021-04-16 DIAGNOSIS — F329 Major depressive disorder, single episode, unspecified: Secondary | ICD-10-CM | POA: Diagnosis not present

## 2021-04-16 DIAGNOSIS — E785 Hyperlipidemia, unspecified: Secondary | ICD-10-CM

## 2021-04-18 ENCOUNTER — Telehealth: Payer: Self-pay | Admitting: *Deleted

## 2021-04-18 ENCOUNTER — Telehealth: Payer: Self-pay

## 2021-04-18 NOTE — Progress Notes (Signed)
Received notification from [place] regarding approval for RYBELSUS. Patient assistance approved from 04/15/21 to 07/16/21.  MEDICATION WILL SHIP TO OFFICE  Phone: [phone]

## 2021-04-18 NOTE — Telephone Encounter (Signed)
   Telephone encounter was:  Successful.  04/18/2021 Name: Donna Rogers MRN: [id number] DOB: Jan 01, 1951  Donna Rogers is a 70 y.o. year old female who is a primary care patient of [name], DO . The community resource team was consulted for assistance with  Provided patient about [employer] Life Alert information. Provided their phone number and will be sending patient the information packet in mail.  Care guide performed the following interventions: Patient provided with information about care guide support team and interviewed to confirm resource needs.  Follow Up Plan:  No further follow up planned at this time. The patient has been provided with needed resources.    Instructed patient to call back at [phone]  at their earliest convenience.   [place], Care Management  [phone] [address] , [place] [address] Email : [email or web address]

## 2021-04-24 ENCOUNTER — Telehealth: Payer: Self-pay | Admitting: Family Medicine

## 2021-04-25 NOTE — Telephone Encounter (Signed)
Samples of farxiga and rybelsus left up front

## 2021-04-29 ENCOUNTER — Other Ambulatory Visit: Payer: Self-pay | Admitting: Family Medicine

## 2021-05-06 ENCOUNTER — Other Ambulatory Visit: Payer: Self-pay | Admitting: Family Medicine

## 2021-05-08 ENCOUNTER — Other Ambulatory Visit: Payer: Self-pay

## 2021-05-08 ENCOUNTER — Ambulatory Visit (INDEPENDENT_AMBULATORY_CARE_PROVIDER_SITE_OTHER): Payer: Medicare Other | Admitting: Pharmacist

## 2021-05-08 DIAGNOSIS — E1165 Type 2 diabetes mellitus with hyperglycemia: Secondary | ICD-10-CM

## 2021-05-08 NOTE — Progress Notes (Signed)
Chronic Care Management Pharmacy Note  05/08/2021 Name:  Donna Rogers MRN:  [id number] DOB:  February 10, 1951  Summary: T2DM  Recommendations/Changes made from today's visit: Diabetes: Uncontrolled; current treatment: RYBELSUS, JARDIANCE, GLIPIZIDE;  GFR 66, A1C 9.5% WILL TRANSITION JARDIANCE TO FARXIGA (EASE OF PATIENT ASSISTANCE)--SAMPLES GIVEN APPLICATION SUBMITTED TO [employer] FOR FARXIGA MEDICATION TO SHIP TO PATIENT'S HOME MAY HAVE TO INCREASE RYBELSUS PENDING Bgs APPLICATION SUBMITTED FOR NOVO [place] PAP (RYBELSUS)--we have not heard back from company yet--samples given Denies personal and family history of Medullary thyroid cancer (MTC) Medication to ship to PCP office Current glucose readings: fasting glucose: <160, post prandial glucose: 200s--today post prandial was 138--much improved Denies hypoglycemic/hyperglycemic symptoms Discussed meal planning options and Plate method for healthy eating Avoid sugary drinks and desserts Incorporate balanced protein, non starchy veggies, 1 serving of carbohydrate with each meal Increase water intake Increase physical activity as able Current exercise: N/A Educated on TRANSITION TO [place] patient finances. WILL APPLY FOR [employer] (FARXIGA) AND NOVO [place] PATIENT ASSISTANCE PROGRAMS FOR 2022--all info faxed, have not heard back--will try again  Follow Up Plan: Telephone follow up appointment with care management team member scheduled for: 2-3 WEEKS  Subjective: Donna Rogers is an 70 y.o. year old female who is a primary patient of [name], DO.  The CCM team was consulted for assistance with disease management and care coordination needs.    Engaged with patient face to face for follow up visit in response to provider referral for pharmacy case management and/or care coordination services.   Consent to Services:  The patient was given information about Chronic Care Management services, agreed to services,  and gave verbal consent prior to initiation of services.  Please see initial visit note for detailed documentation.   Patient Care Team: [name], DO as PCP - General (Family Medicine) Shea Evans Norva Riffle, [name] as Social Worker (Licensed Clinical Social Worker) Blanca Friend, Royce Macadamia, [facility] (Pharmacist) Lavera Guise, [facility] as Pharmacist (Family Medicine) Gatha Mayer, MD as Consulting Physician (Gastroenterology) [facility] (Specialist)  Objective:  Lab Results  Component Value Date   CREATININE 0.78 04/04/2021   CREATININE 0.94 10/15/2020   CREATININE 0.98 09/08/2019    Lab Results  Component Value Date   HGBA1C 11.8 (H) 04/04/2021   Last diabetic Eye exam:  Lab Results  Component Value Date/Time   HMDIABEYEEXA No Retinopathy 04/04/2021 12:00 AM    Last diabetic Foot exam: No results found for: HMDIABFOOTEX      Component Value Date/Time   CHOL 123 10/15/2020 1134   TRIG 184 (H) 10/15/2020 1134   HDL 29 (L) 10/15/2020 1134   CHOLHDL 4.2 10/15/2020 1134   [place] 63 10/15/2020 1134    Hepatic Function Latest Ref Rng & Units 10/15/2020 12/28/2018 08/03/2018  Total Protein 6.0 - 8.5 g/dL 6.9 7.3 6.8  Albumin 3.8 - 4.8 g/dL 4.0 4.3 4.1  AST 0 - 40 IU/L 16 33 34  ALT 0 - 32 IU/L 19 32 50(H)  Alk Phosphatase 44 - 121 IU/L 94 89 99  Total Bilirubin 0.0 - 1.2 mg/dL 0.3 0.4 0.2    Lab Results  Component Value Date/Time   TSH 1.790 07/03/2019 02:28 PM   TSH 0.706 08/03/2018 01:43 PM    CBC Latest Ref Rng & Units 10/15/2020 08/29/2019 08/03/2018  WBC 3.4 - 10.8 x10E3/uL 8.8 9.4 8.0  Hemoglobin 11.1 - 15.9 g/dL 14.7 14.2 14.3  Hematocrit 34.0 - 46.6 % 45.4 43.9 41.2  Platelets 150 - 450 x10E3/uL 269 237 242    No results found for: VD25OH  Clinical ASCVD: No  The ASCVD Risk score (Arnett DK, et al., 2019) failed to calculate for the following reasons:   The valid total cholesterol range is 130 to 320 mg/dL    Other: (CHADS2VASc if Afib, PHQ9 if depression,  MMRC or CAT for COPD, ACT, DEXA)  Social History   Tobacco Use  Smoking Status Every Day   Packs/day: 1.00   Years: 46.00   Pack years: 46.00   Types: Cigarettes  Smokeless Tobacco Never   BP Readings from Last 3 Encounters:  04/04/21 (!) 164/111  04/03/21 (!) 150/84  12/20/20 133/82   Pulse Readings from Last 3 Encounters:  04/04/21 81  04/03/21 80  12/20/20 82   Wt Readings from Last 3 Encounters:  04/15/21 188 lb (85.3 kg)  04/04/21 188 lb 12.8 oz (85.6 kg)  04/03/21 188 lb (85.3 kg)    Assessment: Review of patient past medical history, allergies, medications, health status, including review of consultants reports, laboratory and other test data, was performed as part of comprehensive evaluation and provision of chronic care management services.   SDOH:  (Social Determinants of Health) assessments and interventions performed:    CCM Care Plan  Allergies  Allergen Reactions   Ciprofloxacin Hcl Rash   Enbrel [Etanercept] Rash   Metformin And Related Diarrhea    Medications Reviewed Today     Reviewed by Sandrea Hammond, LPN (Licensed Practical Nurse) on 04/15/21 at Center List Status: <None>   Medication Order Taking? Sig Documenting Provider Last Dose Status Informant  Alpha-Lipoic Acid 600 MG CAPS [id number] Yes Take 1 capsule (600 mg total) by mouth daily. For neuropathy [name], DO Taking Active   amLODipine (NORVASC) 5 MG tablet [id number] Yes TAKE ONE TABLET BY MOUTH EVERY DAY Ronnie Doss M, DO Taking Active   Ascorbic Acid (VITAMIN C) 500 MG CHEW [id number] Yes 1 tablet [provider] Taking Active   aspirin 81 MG tablet [id number] Yes Take 81 mg by mouth daily. [provider] Taking Active Self  atenolol (TENORMIN) 25 MG tablet [id number] Yes TAKE 1 TABLET BY MOUTH DAILY. Ronnie Doss M, DO Taking Active   azelastine (OPTIVAR) 0.05 % ophthalmic solution [id number]  in the morning and at bedtime. [provider]  Active   budesonide (ENTOCORT EC) 3 MG 24 hr capsule [id number] Yes Take 3 capsules (9 mg total) by mouth daily. [name], [name], PA-C Taking Active   buPROPion (WELLBUTRIN XL) 300 MG 24 hr tablet [id number] Yes TAKE 1 TABLET DAILY Ronnie Doss M, DO Taking Active   busPIRone (BUSPAR) 10 MG tablet [id number] Yes TAKE (1) TABLET TWICE A DAY. Ronnie Doss M, DO Taking Active   cholecalciferol (VITAMIN D) 1000 UNITS tablet [id number] Yes Take 1,000 Units by mouth daily. [provider] Taking Active Self  citalopram (CELEXA) 40 MG tablet [id number] Yes TAKE 1 TABLET EVERY DAY (Needs to be seen) [name], DO Taking Active   Cyanocobalamin (VITAMIN B 12 PO) [id number] Yes Take by mouth. [provider] Taking Active   dapagliflozin propanediol (FARXIGA) 10 MG TABS tablet [id number] Yes Take 1 tablet (10 mg total) by mouth daily before breakfast. [name], DO Taking Active   diclofenac (VOLTAREN) 75 MG EC tablet [id number] Yes TAKE ONE TABLET BY MOUTH TWICE DAILY AS NEEDED FOR MODERATE PAIN (USE SPARINGLY) [name], DO Taking Active  diphenoxylate-atropine (LOMOTIL) 2.5-0.025 MG tablet [id number] Yes TAKE 1 OR 2 TABLETS FOUR TIMES DAILY AS NEEDED Gatha Mayer, MD Taking Active   folic acid (FOLVITE) 1 MG tablet [id number] Yes Take 1 mg by mouth daily. [provider] Taking Active   gabapentin (NEURONTIN) 600 MG tablet [id number] Yes TAKE TWO TABLETS BY MOUTH THREE TIMES DAILY Ronnie Doss M, DO Taking Active   glipiZIDE (GLUCOTROL) 10 MG tablet [id number] No TAKE (1) TABLET TWICE A DAY BEFORE MEALS.  Patient not taking: Reported on 04/15/2021   Ronnie Doss M, DO Not Taking Active   HYDROcodone-acetaminophen (NORCO/VICODIN) 5-325 MG per tablet [id number] Yes Take 1 tablet by mouth every 6 (six) hours as needed. Lysbeth Penner, FNP Taking Active Self  losartan (COZAAR) 100 MG tablet [id number] Yes TAKE 1 TABLET DAILY Ronnie Doss M, DO Taking Active   methotrexate (RHEUMATREX) 2.5 MG tablet [id number] Yes Take 15 mg by mouth once a week. [provider] Taking Active   omeprazole (PRILOSEC) 20 MG capsule [id number] Yes Take 1 capsule (20 mg total) by mouth daily. Please cancel protonix [name], DO Taking Active   Oyster Shell Calcium 500 MG TABS [id number] Yes 1 tablet with meals [provider] Taking Active   Pyridoxine HCl (VITAMIN B-6 PO) [id number] Yes Take by mouth. [provider] Taking Active   Semaglutide (RYBELSUS) 14 MG TABS [id number] Yes Take 14 mg by mouth daily. Ronnie Doss M, DO Taking Active   simvastatin (ZOCOR) 40 MG tablet [id number] Yes TAKE 1 TABLET DAILY [name], DO Taking Active   SULFASALAZINE PO [id number] Yes Take by mouth. [provider] Taking Active            Med Note Georgiann Cocker, AMY E   Tue Apr 15, 2021  4:00 PM) 500 mg BID  zolpidem (AMBIEN) 5 MG tablet [id number] Yes Take 1 tablet at bedtime as needed for sleep (use sparingly). [name], DO Taking Active             Patient Active Problem List   Diagnosis Date Noted   Degeneration of lumbar intervertebral disc 04/15/2021   Osteopenia 04/15/2021   Primary osteoarthritis 04/15/2021   Carpal tunnel syndrome of right wrist 07/17/2020   Dysphagia 03/27/2020   Generalized anxiety disorder with panic attacks 09/01/2019   Hyperlipidemia associated with type 2 diabetes mellitus ([place]) 12/28/2018   Chronic pain of right knee 12/28/2018   Tobacco abuse 04/10/2016   Lymphocytic colitis 12/03/2015   DM2 (diabetes mellitus, type 2) ([place]) 10/11/2015   Depressive disorder 06/17/2015   Fatigue 06/17/2015   Weakness 06/03/2015   Diarrhea 05/13/2015   Hypertension associated with diabetes ([place])    OBESITY 01/21/2009   EROSIVE ESOPHAGITIS 01/21/2009   GASTROESOPHAGEAL REFLUX DISEASE, CHRONIC 01/21/2009   Rheumatoid arthritis ([place]) 01/21/2009   TENDINITIS 01/21/2009    EDEMA 01/21/2009    Immunization History  Administered Date(s) Administered   Fluad Quad(high Dose 65+) 05/03/2019   Influenza,inj,Quad PF,6+ Mos 05/25/2013, 07/31/2015, 06/18/2016, 05/16/2018   Influenza-Unspecified 05/07/2019, 07/24/2020, 05/08/2021   PPD Test 05/08/2013   Pneumococcal Conjugate-13 05/16/2018   Pneumococcal Polysaccharide-23 07/03/2019   Tdap 12/28/2018   Zoster Recombinat (Shingrix) 06/06/2019, 06/30/2019, 10/27/2019    Conditions to be addressed/monitored: DMII  Care Plan : PHARMD MEDICATION MANAGEMENT  Updates made by Lavera Guise, RPH since 05/13/2021 12:00 AM     Problem: DISEASE PROGRESSION PREVENTION      Long-Range Goal: T2DM   Recent Progress: Not on  track  Priority: High  Note:   Current Barriers:  Unable to independently afford treatment regimen Unable to achieve control of T2DM   Pharmacist Clinical Goal(s):  Over the next 90 days, patient will verbalize ability to afford treatment regimen achieve control of T2DM as evidenced by IMPROVED GLYCEMIC CONTROL, GOAL A1C<7%  through collaboration with PharmD and provider.    Interventions: 1:1 collaboration with [name], DO regarding development and update of comprehensive plan of care as evidenced by provider attestation and co-signature Inter-disciplinary care team collaboration (see longitudinal plan of care) Comprehensive medication review performed; medication list updated in electronic medical record  Diabetes: Uncontrolled; current treatment: RYBELSUS, JARDIANCE, GLIPIZIDE;  GFR 66, A1C 9.5% WILL TRANSITION [place] (EASE OF PATIENT ASSISTANCE)--SAMPLES GIVEN APPLICATION SUBMITTED TO [employer] FOR [place] PAP (RYBELSUS)--we have not heard back from company yet--samples given Denies personal and family history of Medullary thyroid cancer  (MTC) Medication to ship to PCP office Current glucose readings: fasting glucose: <160, post prandial glucose: 200s--today post prandial was 138--much improved Denies hypoglycemic/hyperglycemic symptoms Discussed meal planning options and Plate method for healthy eating Avoid sugary drinks and desserts Incorporate balanced protein, non starchy veggies, 1 serving of carbohydrate with each meal Increase water intake Increase physical activity as able Current exercise: N/A Educated on TRANSITION TO [place] patient finances. WILL APPLY FOR [employer]ME ([place]) AND NOVO [place] PATIENT ASSISTANCE PROGRAMS FOR 2022 --all info faxed, have not heard back--will try again   Patient Goals/Self-Care Activities Over the next 90 days, patient will:  - take medications as prescribed collaborate with provider on medication access solutions  Follow Up Plan: Telephone follow up appointment with care management team member scheduled for: 2-3 WEEKS      Medication Assistance: Application for FARXIGA/RYBELSUS  medication assistance program. in process.  Anticipated assistance start date TBD.  See plan of care for additional detail.  Patient's preferred pharmacy is:  [facility], [place] [address] Phone: [phone] Fax: [facility], [place] [address] [place] [address] Phone: [phone] Fax: [phone]  Follow Up:  Patient agrees to Care Plan and Follow-up.  Plan: Telephone follow up appointment with care management team member scheduled for:  3 WEEKS  Regina Eck, PharmD, BCPS Clinical Pharmacist, [place]  II Phone [phone]

## 2021-05-09 ENCOUNTER — Telehealth: Payer: Medicare Other

## 2021-05-13 NOTE — Patient Instructions (Signed)
Visit Information  PATIENT GOALS:  Goals Addressed               This Visit's Progress     Patient Stated     T2DM PHARMD GOAL (pt-stated)        Current Barriers:  Unable to independently afford treatment regimen Unable to achieve control of T2DM   Pharmacist Clinical Goal(s):  Over the next 90 days, patient will verbalize ability to afford treatment regimen achieve control of T2DM as evidenced by IMPROVED GLYCEMIC CONTROL, GOAL A1C<7% through collaboration with PharmD and provider.    Interventions: 1:1 collaboration with [name], DO regarding development and update of comprehensive plan of care as evidenced by provider attestation and co-signature Inter-disciplinary care team collaboration (see longitudinal plan of care) Comprehensive medication review performed; medication list updated in electronic medical record  Diabetes: Uncontrolled; current treatment: RYBELSUS, JARDIANCE, GLIPIZIDE;  GFR 66, A1C 9.5% WILL TRANSITION [place] (EASE OF PATIENT ASSISTANCE)--SAMPLES GIVEN APPLICATION SUBMITTED TO [employer] FOR [place] PAP (RYBELSUS)--we have not heard back from company yet--samples given Denies personal and family history of Medullary thyroid cancer (MTC) Medication to ship to PCP office Current glucose readings: fasting glucose: <160, post prandial glucose: 200s--today post prandial was 138--much improved Denies hypoglycemic/hyperglycemic symptoms Discussed meal planning options and Plate method for healthy eating Avoid sugary drinks and desserts Incorporate balanced protein, non starchy veggies, 1 serving of carbohydrate with each meal Increase water intake Increase physical activity as able Current exercise: N/A Educated on TRANSITION TO [place] patient finances. WILL APPLY FOR [employer] (FARXIGA) AND NOVO [place]  PATIENT ASSISTANCE PROGRAMS FOR 2022--all info faxed, have not heard back--will try again   Patient Goals/Self-Care Activities Over the next 90 days, patient will:  - take medications as prescribed collaborate with provider on medication access solutions  Follow Up Plan: Telephone follow up appointment with care management team member scheduled for: 2-3 WEEKS         The patient verbalized understanding of instructions, educational materials, and care plan provided today and declined offer to receive copy of patient instructions, educational materials, and care plan.   Telephone follow up appointment with care management team member scheduled for:  Signature Regina Eck, PharmD, BCPS Clinical Pharmacist, [place]  II Phone [phone]

## 2021-05-14 ENCOUNTER — Ambulatory Visit: Payer: Medicare Other | Admitting: Pharmacist

## 2021-05-14 DIAGNOSIS — E1165 Type 2 diabetes mellitus with hyperglycemia: Secondary | ICD-10-CM

## 2021-05-14 NOTE — Patient Instructions (Signed)
Visit Information  PATIENT GOALS:  Goals Addressed               This Visit's Progress     Patient Stated     T2DM PHARMD GOAL (pt-stated)        Current Barriers:  Unable to independently afford treatment regimen Unable to achieve control of T2DM   Pharmacist Clinical Goal(s):  Over the next 90 days, patient will verbalize ability to afford treatment regimen achieve control of T2DM as evidenced by IMPROVED GLYCEMIC CONTROL, GOAL A1C<7% through collaboration with PharmD and provider.    Interventions: 1:1 collaboration with [name], DO regarding development and update of comprehensive plan of care as evidenced by provider attestation and co-signature Inter-disciplinary care team collaboration (see longitudinal plan of care) Comprehensive medication review performed; medication list updated in electronic medical record  Diabetes: Uncontrolled; current treatment: RYBELSUS, FARXIGA, GLIPIZIDE;  GFR 66, A1C 9.5% CONTINUE FARXIGA (EASE OF PATIENT ASSISTANCE)--SAMPLES GIVEN APPLICATION TO BE SUBMITTED [employer] FOR [place] 7mg  daily--MAY HAVE TO INCREASE RYBELSUS PENDING Bgs APPLICATION SUBMITTED FOR NOVO [place] PAP (RYBELSUS)--there was a mix up with company, they now have our address correct 10-14 business days for rybelsus to ship to us--samples given Denies personal and family history of Medullary thyroid cancer ([place]) Medication to ship to PCP office Current glucose readings: fasting glucose: <160, post prandial glucose: 200s--today post prandial was 138--much improved Denies hypoglycemic/hyperglycemic symptoms Discussed meal planning options and Plate method for healthy eating Avoid sugary drinks and desserts Incorporate balanced protein, non starchy veggies, 1 serving of carbohydrate with each meal Increase water intake Increase physical activity as able Current exercise: N/A Educated on TRANSITION TO  [place] patient finances. WILL APPLY FOR [employer]ME (FARXIGA) AND NOVO [place] PATIENT ASSISTANCE PROGRAMS FOR 2022--novo to ship rybelsus in 10-14 business days, az&me to be faxed again   Patient Goals/Self-Care Activities Over the next 90 days, patient will:  - take medications as prescribed collaborate with provider on medication access solutions  Follow Up Plan: Telephone follow up appointment with care management team member scheduled for: 2-3 WEEKS         The patient verbalized understanding of instructions, educational materials, and care plan provided today and declined offer to receive copy of patient instructions, educational materials, and care plan.   Telephone follow up appointment with care management team member scheduled for:4 weeks  Signature Regina Eck, PharmD, BCPS Clinical Pharmacist, [place]  II Phone [phone]

## 2021-05-14 NOTE — Progress Notes (Signed)
Chronic Care Management Pharmacy Note  05/14/2021 Name:  Donna Rogers MRN:  [id number] DOB:  02-12-1951  Summary: T2DM  Recommendations/Changes made from today's visit: Diabetes: Uncontrolled; current treatment: RYBELSUS, FARXIGA, GLIPIZIDE;  GFR 66, A1C 9.5% CONTINUE FARXIGA (EASE OF PATIENT ASSISTANCE)--SAMPLES GIVEN APPLICATION TO BE SUBMITTED AZ&ME FOR FARXIGA MEDICATION TO SHIP TO PATIENT'S HOME Continue RYBELSUS 6m daily--MAY HAVE TO INCREASE RYBELSUS PENDING Bgs APPLICATION SUBMITTED FOR [place] (RY[place]ELSUS)--there was a mix up with company, they now have our address correct 10-14 business days for rybelsus to ship to us--samples given Denies personal and family history of Medullary thyroid cancer ([place] Medication to ship to PCP office Current glucose readings: fasting glucose: <160, post prandial glucose: 200s--today post prandial was 138--much improved Denies hypoglycemic/hyperglycemic symptoms Discussed meal planning options and Plate method for healthy eating Avoid sugary drinks and desserts Incorporate balanced protein, non starchy veggies, 1 serving of carbohydrate with each meal Increase water intake Increase physical activity as able Current exercise: N/A [place]. WILL APPLY FOR AZ&ME (FARXIGA) AND [place] 20[place]--novo to ship rybelsus in 10-14 business days, az&me to be faxed again  Follow Up Plan: Telephone follow up appointment with care management team member scheduled for: 2-3 WEEKS  Subjective: PMANVI GUILLIAMSis an 70y.o. year old female who is a primary patient of Donna Norlander DO.  The CCM team was consulted for assistance with disease management and care coordination needs.    Engaged with patient by telephone for follow up visit in response to provider referral for pharmacy case management and/or care coordination services.   Consent to  Services:  The patient was given information about Chronic Care Management services, agreed to services, and gave verbal consent prior to initiation of services.  Please see initial visit note for detailed documentation.   Patient Care Team: Donna Norlander DO as PCP - General (Family Medicine) FShea EvansMNorva Riffle [name] as Social Worker (Licensed Clinical Social Worker) PBlanca Friend JRoyce Macadamia [facility](Pharmacist) PLavera Guise [facility] Pharmacist (Family Medicine) GGatha Mayer MD as Consulting Physician (Gastroenterology) [facility] (Specialist)  Objective:  Lab Results  Component Value Date   CREATININE 0.78 04/04/2021   CREATININE 0.94 10/15/2020   CREATININE 0.98 09/08/2019    Lab Results  Component Value Date   HGBA1C 11.8 (H) 04/04/2021   Last diabetic Eye exam:  Lab Results  Component Value Date/Time   HMDIABEYEEXA No Retinopathy 04/04/2021 12:00 AM    Last diabetic Foot exam: No results found for: HMDIABFOOTEX      Component Value Date/Time   CHOL 123 10/15/2020 1134   TRIG 184 (H) 10/15/2020 1134   HDL 29 (L) 10/15/2020 1134   CHOLHDL 4.2 10/15/2020 1134   [place]63 10/15/2020 1134    Hepatic Function Latest Ref Rng & Units 10/15/2020 12/28/2018 08/03/2018  Total Protein 6.0 - 8.5 g/dL 6.9 7.3 6.8  Albumin 3.8 - 4.8 g/dL 4.0 4.3 4.1  AST 0 - 40 IU/L 16 33 34  ALT 0 - 32 IU/L 19 32 50(H)  Alk Phosphatase 44 - 121 IU/L 94 89 99  Total Bilirubin 0.0 - 1.2 mg/dL 0.3 0.4 0.2    Lab Results  Component Value Date/Time   TSH 1.790 07/03/2019 02:28 PM   TSH 0.706 08/03/2018 01:43 PM    CBC Latest Ref Rng & Units 10/15/2020 08/29/2019 08/03/2018  WBC 3.4 - 10.8 x10E3/uL 8.8 9.4 8.0  Hemoglobin 11.1 - 15.9 g/dL 14.7 14.2 14.3  Hematocrit 34.0 - 46.6 % 45.4 43.9 41.2  Platelets 150 - 450 x10E3/uL 269 237 242    No results found for: VD25OH  Clinical ASCVD: No  The ASCVD Risk score (Arnett DK, et al., 2019) failed to calculate for the following reasons:   The  valid total cholesterol range is 130 to 320 mg/dL    Other: (CHADS2VASc if Afib, PHQ9 if depression, MMRC or CAT for COPD, ACT, DEXA)  Social History   Tobacco Use  Smoking Status Every Day   Packs/day: 1.00   Years: 46.00   Pack years: 46.00   Types: Cigarettes  Smokeless Tobacco Never   BP Readings from Last 3 Encounters:  04/04/21 (!) 164/111  04/03/21 (!) 150/84  12/20/20 133/82   Pulse Readings from Last 3 Encounters:  04/04/21 81  04/03/21 80  12/20/20 82   Wt Readings from Last 3 Encounters:  04/15/21 188 lb (85.3 kg)  04/04/21 188 lb 12.8 oz (85.6 kg)  04/03/21 188 lb (85.3 kg)    Assessment: Review of patient past medical history, allergies, medications, health status, including review of consultants reports, laboratory and other test data, was performed as part of comprehensive evaluation and provision of chronic care management services.   SDOH:  (Social Determinants of Health) assessments and interventions performed:    CCM Care Plan  Allergies  Allergen Reactions   Ciprofloxacin Hcl Rash   Enbrel [Etanercept] Rash   Metformin And Related Diarrhea    Medications Reviewed Today     Reviewed by Lavera Guise, [facility] (Pharmacist) on 05/13/21 at 1112  Med List Status: <None>   Medication Order Taking? Sig Documenting Provider Last Dose Status Informant  Alpha-Lipoic Acid 600 MG CAPS [id number] No Take 1 capsule (600 mg total) by mouth daily. For neuropathy [name], DO Taking Active   amLODipine (NORVASC) 5 MG tablet [id number]  TAKE ONE TABLET BY MOUTH EVERY DAY Ronnie Doss M, DO  Active   Ascorbic Acid (VITAMIN C) 500 MG CHEW [id number] No 1 tablet [provider] Taking Active   aspirin 81 MG tablet [id number] No Take 81 mg by mouth daily. [provider] Taking Active Self  atenolol (TENORMIN) 25 MG tablet [id number]  TAKE 1 TABLET BY MOUTH DAILY. Ronnie Doss M, DO  Active   azelastine (OPTIVAR) 0.05 % ophthalmic  solution [id number] No in the morning and at bedtime. [provider] Taking Active   budesonide (ENTOCORT EC) 3 MG 24 hr capsule [id number] No Take 3 capsules (9 mg total) by mouth daily. Loralie Champagne, PA-C Taking Active   buPROPion (WELLBUTRIN XL) 300 MG 24 hr tablet [id number] No TAKE 1 TABLET DAILY Ronnie Doss M, DO Taking Active   busPIRone (BUSPAR) 10 MG tablet [id number]  TAKE (1) TABLET TWICE A DAY. Ronnie Doss M, DO  Active   cholecalciferol (VITAMIN D) 1000 UNITS tablet [id number] No Take 1,000 Units by mouth daily. [provider] Taking Active Self  citalopram (CELEXA) 40 MG tablet [id number] No TAKE 1 TABLET EVERY DAY (Needs to be seen) [name], DO Taking Active   Cyanocobalamin (VITAMIN B 12 PO) [id number] No Take by mouth. [provider] Taking Active   dapagliflozin propanediol (FARXIGA) 10 MG TABS tablet [id number] No Take 1 tablet (10 mg total) by mouth daily before breakfast. [name], DO Taking Active   diclofenac (VOLTAREN) 75 MG EC tablet [id number]  TAKE ONE TABLET BY MOUTH TWICE  DAILY AS NEEDED FOR MODERATE PAIN (USE SPARINGLY) Ronnie Doss M, DO  Active   diphenoxylate-atropine (LOMOTIL) 2.5-0.025 MG tablet [id number] No TAKE 1 OR 2 TABLETS FOUR TIMES DAILY AS NEEDED Gatha Mayer, MD Taking Active   folic acid (FOLVITE) 1 MG tablet [id number] No Take 1 mg by mouth daily. [provider] Taking Active   gabapentin (NEURONTIN) 600 MG tablet [id number] No TAKE TWO TABLETS BY MOUTH THREE TIMES DAILY [name], DO Taking Active            Med Note [facility], AMY E   Tue Apr 15, 2021  4:09 PM) Takes 2 BID - trying to cut back  glipiZIDE (GLUCOTROL) 10 MG tablet [id number] No TAKE (1) TABLET TWICE A DAY BEFORE MEALS.  Patient not taking: Reported on 04/15/2021   [name], DO Not Taking Active   HYDROcodone-acetaminophen (NORCO/VICODIN) 5-325 MG per tablet [id number] No Take 1 tablet by mouth  every 6 (six) hours as needed. Lysbeth Penner, FNP Taking Active Self  losartan (COZAAR) 100 MG tablet [id number]  TAKE 1 TABLET DAILY Ronnie Doss M, DO  Active   methotrexate (RHEUMATREX) 2.5 MG tablet [id number] No Take 15 mg by mouth once a week. [provider] Taking Active   omeprazole (PRILOSEC) 20 MG capsule [id number] No Take 1 capsule (20 mg total) by mouth daily. Please cancel protonix [name], DO Taking Active   Oyster Shell Calcium 500 MG TABS [id number] No 1 tablet with meals [provider] Taking Active   Pyridoxine HCl (VITAMIN B-6 PO) [id number] No Take by mouth. [provider] Taking Active   Semaglutide (RYBELSUS) 14 MG TABS [id number] No Take 14 mg by mouth daily. Ronnie Doss M, DO Taking Active   simvastatin (ZOCOR) 40 MG tablet [id number] No TAKE 1 TABLET DAILY [name], DO Taking Active   SULFASALAZINE PO [id number] No Take by mouth. [provider] Taking Active            Med Note Georgiann Cocker, AMY E   Tue Apr 15, 2021  4:00 PM) 500 mg BID  zolpidem (AMBIEN) 5 MG tablet [id number] No Take 1 tablet at bedtime as needed for sleep (use sparingly). [name], DO Taking Active             Patient Active Problem List   Diagnosis Date Noted   Degeneration of lumbar intervertebral disc 04/15/2021   Osteopenia 04/15/2021   Primary osteoarthritis 04/15/2021   Carpal tunnel syndrome of right wrist 07/17/2020   Dysphagia 03/27/2020   Generalized anxiety disorder with panic attacks 09/01/2019   Hyperlipidemia associated with type 2 diabetes mellitus ([place]) 12/28/2018   Chronic pain of right knee 12/28/2018   Tobacco abuse 04/10/2016   Lymphocytic colitis 12/03/2015   DM2 (diabetes mellitus, type 2) ([place]) 10/11/2015   Depressive disorder 06/17/2015   Fatigue 06/17/2015   Weakness 06/03/2015   Diarrhea 05/13/2015   Hypertension associated with diabetes ([place])    OBESITY 01/21/2009   EROSIVE  ESOPHAGITIS 01/21/2009   GASTROESOPHAGEAL REFLUX DISEASE, CHRONIC 01/21/2009   Rheumatoid arthritis ([place]) 01/21/2009   TENDINITIS 01/21/2009   EDEMA 01/21/2009    Immunization History  Administered Date(s) Administered   Fluad Quad(high Dose 65+) 05/03/2019   Influenza,inj,Quad PF,6+ Mos 05/25/2013, 07/31/2015, 06/18/2016, 05/16/2018   Influenza-Unspecified 05/07/2019, 07/24/2020, 05/08/2021   PPD Test 05/08/2013   Pneumococcal Conjugate-13 05/16/2018   Pneumococcal Polysaccharide-23 07/03/2019   Tdap 12/28/2018   Zoster Recombinat (Shingrix) 06/06/2019, 06/30/2019, 10/27/2019  Conditions to be addressed/monitored: DMII  Care Plan : PHARMD MEDICATION MANAGEMENT  Updates made by Lavera Guise, RPH since 05/14/2021 12:00 AM     Problem: DISEASE PROGRESSION PREVENTION      Long-Range Goal: T2DM   Recent Progress: Not on track  Priority: High  Note:   Current Barriers:  Unable to independently afford treatment regimen Unable to achieve control of T2DM   Pharmacist Clinical Goal(s):  Over the next 90 days, patient will verbalize ability to afford treatment regimen achieve control of T2DM as evidenced by IMPROVED GLYCEMIC CONTROL, GOAL A1C<7%  through collaboration with PharmD and provider.    Interventions: 1:1 collaboration with [name], DO regarding development and update of comprehensive plan of care as evidenced by provider attestation and co-signature Inter-disciplinary care team collaboration (see longitudinal plan of care) Comprehensive medication review performed; medication list updated in electronic medical record  Diabetes: Uncontrolled; current treatment: RYBELSUS, FARXIGA, GLIPIZIDE;  GFR 66, A1C 9.5% CONTINUE FARXIGA (EASE OF PATIENT ASSISTANCE)--SAMPLES GIVEN APPLICATION TO BE SUBMITTED AZ&ME FOR [place] 00PQ daily APPLICATION SUBMITTED FOR NOVO [place] PAP (RYBELSUS)--there was a mix  up with company, they now have our address correct 10-14 business days for rybelsus to ship to us--samples given Denies personal and family history of Medullary thyroid cancer (MTC) Medication to ship to PCP office Current glucose readings: fasting glucose: <160, post prandial glucose: 200s--today post prandial was 138--much improved Denies hypoglycemic/hyperglycemic symptoms Discussed meal planning options and Plate method for healthy eating Avoid sugary drinks and desserts Incorporate balanced protein, non starchy veggies, 1 serving of carbohydrate with each meal Increase water intake Increase physical activity as able Current exercise: N/A Educated on [place] Assessed patient finances. WILL APPLY FOR AZ&ME (FARXIGA) AND NOVO [place] PATIENT ASSISTANCE PROGRAMS FOR 2022 --novo to ship rybelsus in 10-14 business days, az&me to be faxed again   Patient Goals/Self-Care Activities Over the next 90 days, patient will:  - take medications as prescribed collaborate with provider on medication access solutions  Follow Up Plan: Telephone follow up appointment with care management team member scheduled for: 2-3 WEEKS      Medication Assistance:  [place] RYBELSUS--TO SHIP IN 10-14 BUSINESS DAYS  Patient's preferred pharmacy is:  [facility], [address] [place] [address] Phone: [phone] Fax: [place], [address] [place] [address] Phone: [phone] Fax: [phone]  Follow Up:  Patient agrees to Care Plan and Follow-up.  Plan: Telephone follow up appointment with care management team member scheduled for:  4 WEEKS  Regina Eck, PharmD, BCPS Clinical Pharmacist, [place]  II Phone [phone]

## 2021-05-16 DIAGNOSIS — E1165 Type 2 diabetes mellitus with hyperglycemia: Secondary | ICD-10-CM

## 2021-06-05 ENCOUNTER — Telehealth: Payer: Self-pay | Admitting: Family Medicine

## 2021-06-05 NOTE — Telephone Encounter (Signed)
Rybelsus 14 mg came in for patient today. Patient called and message left for patient to come by and pick up.

## 2021-06-13 ENCOUNTER — Ambulatory Visit (INDEPENDENT_AMBULATORY_CARE_PROVIDER_SITE_OTHER): Payer: Medicare Other | Admitting: Pharmacist

## 2021-06-13 DIAGNOSIS — E1169 Type 2 diabetes mellitus with other specified complication: Secondary | ICD-10-CM

## 2021-06-13 DIAGNOSIS — E1159 Type 2 diabetes mellitus with other circulatory complications: Secondary | ICD-10-CM

## 2021-06-13 DIAGNOSIS — E785 Hyperlipidemia, unspecified: Secondary | ICD-10-CM

## 2021-06-13 DIAGNOSIS — I152 Hypertension secondary to endocrine disorders: Secondary | ICD-10-CM

## 2021-06-16 DIAGNOSIS — E785 Hyperlipidemia, unspecified: Secondary | ICD-10-CM | POA: Diagnosis not present

## 2021-06-16 DIAGNOSIS — E1159 Type 2 diabetes mellitus with other circulatory complications: Secondary | ICD-10-CM

## 2021-06-16 DIAGNOSIS — I152 Hypertension secondary to endocrine disorders: Secondary | ICD-10-CM | POA: Diagnosis not present

## 2021-06-16 DIAGNOSIS — E1169 Type 2 diabetes mellitus with other specified complication: Secondary | ICD-10-CM | POA: Diagnosis not present

## 2021-06-17 DIAGNOSIS — M5416 Radiculopathy, lumbar region: Secondary | ICD-10-CM | POA: Diagnosis not present

## 2021-06-23 ENCOUNTER — Ambulatory Visit: Payer: Medicare Other | Admitting: Family Medicine

## 2021-06-23 ENCOUNTER — Encounter: Payer: Self-pay | Admitting: Family Medicine

## 2021-06-24 DIAGNOSIS — M8580 Other specified disorders of bone density and structure, unspecified site: Secondary | ICD-10-CM | POA: Diagnosis not present

## 2021-06-24 DIAGNOSIS — M0579 Rheumatoid arthritis with rheumatoid factor of multiple sites without organ or systems involvement: Secondary | ICD-10-CM | POA: Diagnosis not present

## 2021-06-24 DIAGNOSIS — M150 Primary generalized (osteo)arthritis: Secondary | ICD-10-CM | POA: Diagnosis not present

## 2021-06-24 DIAGNOSIS — M5136 Other intervertebral disc degeneration, lumbar region: Secondary | ICD-10-CM | POA: Diagnosis not present

## 2021-06-24 DIAGNOSIS — R5382 Chronic fatigue, unspecified: Secondary | ICD-10-CM | POA: Diagnosis not present

## 2021-06-24 DIAGNOSIS — K52832 Lymphocytic colitis: Secondary | ICD-10-CM | POA: Diagnosis not present

## 2021-06-25 ENCOUNTER — Telehealth: Payer: Medicare Other

## 2021-06-25 ENCOUNTER — Other Ambulatory Visit: Payer: Self-pay | Admitting: Family Medicine

## 2021-06-25 DIAGNOSIS — F32A Depression, unspecified: Secondary | ICD-10-CM

## 2021-07-01 ENCOUNTER — Other Ambulatory Visit: Payer: Self-pay | Admitting: Family Medicine

## 2021-07-01 DIAGNOSIS — G629 Polyneuropathy, unspecified: Secondary | ICD-10-CM

## 2021-07-02 MED ORDER — DAPAGLIFLOZIN PROPANEDIOL 10 MG PO TABS
10.0000 mg | ORAL_TABLET | Freq: Every day | ORAL | 5 refills | Status: DC
Start: 2021-07-02 — End: 2021-07-31

## 2021-07-02 NOTE — Progress Notes (Signed)
Chronic Care Management Pharmacy Note  06/13/2021 Name:  Donna Rogers MRN:  [id number] DOB:  Sep 23, 1950  Summary: T2DM, HLD  Recommendations/Changes made from today's visit:  Diabetes: Uncontrolled; current treatment: RYBELSUS 14MG DAILY, FARXIGA 10MG, GLIPIZIDE (not taking);  GFR 66, A1C 9.5%-->11.8% (PATIENT HAS BEEN WITHOUT MEDICATION) CONTINUE FARXIGA 10MG (EASE OF PATIENT ASSISTANCE)--SAMPLES GIVEN APPLICATION SUBMITTED AZ&ME FOR FARXIGA MEDICATION TO SHIP TO PATIENT'S HOME (3 MONTH SUPPLY) CONTINUE RYBELSUS 33OV daily APPLICATION SUBMITTED FOR NOVO [employer] PAP (RYBELSUS)--there was a mix up with company, they now have our address correct 10-14 business days for rybelsus to ship to us--samples given Denies personal and family history of Medullary thyroid cancer (MTC) Medication to ship to PCP office Discussed hyperlipidemia briefly LDL now at goal <70 Continue current regimen for now (SIMVA 40MG) Would prefer rosuvastatin, but no complaints from patient at this time Dietary/lifestyle modifications recommended Current glucose readings: fasting glucose: <160, post prandial glucose: <200s Denies hypoglycemic/hyperglycemic symptoms Discussed meal planning options and Plate method for healthy eating Avoid sugary drinks and desserts Incorporate balanced protein, non starchy veggies, 1 serving of carbohydrate with each meal Increase water intake Increase physical activity as able Current exercise: N/A Educated on TRANSITION TO [place] Assessed patient finances. APPLICATION SUBMITTED FOR AZ&ME (FARXIGA) AND NOVO [place] PATIENT ASSISTANCE PROGRAMS FOR 2022-23  Patient Goals/Self-Care Activities Over the next 90 days, patient will:  - take medications as prescribed collaborate with provider on medication access solutions  Follow Up Plan: Telephone follow up appointment with care management team member scheduled for: 1 MONTH  Subjective: Donna Rogers is an 70 y.o.  year old female who is a primary patient of [name], DO.  The CCM team was consulted for assistance with disease management and care coordination needs.    Engaged with patient by telephone for follow up visit in response to provider referral for pharmacy case management and/or care coordination services.   Consent to Services:  The patient was given information about Chronic Care Management services, agreed to services, and gave verbal consent prior to initiation of services.  Please see initial visit note for detailed documentation.   Patient Care Team: [name], DO as PCP - General (Family Medicine) Shea Evans Norva Riffle, [name] as Social Worker (Licensed Clinical Social Worker) Blanca Friend, Royce Macadamia, [facility] as Pharmacist (Family Medicine) Gatha Mayer, MD as Consulting Physician (Gastroenterology) [facility] (Specialist)   Objective:  Lab Results  Component Value Date   CREATININE 0.78 04/04/2021   CREATININE 0.94 10/15/2020   CREATININE 0.98 09/08/2019    Lab Results  Component Value Date   HGBA1C 11.8 (H) 04/04/2021   Last diabetic Eye exam:  Lab Results  Component Value Date/Time   HMDIABEYEEXA No Retinopathy 04/04/2021 12:00 AM    Last diabetic Foot exam: No results found for: HMDIABFOOTEX      Component Value Date/Time   CHOL 123 10/15/2020 1134   TRIG 184 (H) 10/15/2020 1134   HDL 29 (L) 10/15/2020 1134   CHOLHDL 4.2 10/15/2020 1134   LDLCALC 63 10/15/2020 1134    Hepatic Function Latest Ref Rng & Units 10/15/2020 12/28/2018 08/03/2018  Total Protein 6.0 - 8.5 g/dL 6.9 7.3 6.8  Albumin 3.8 - 4.8 g/dL 4.0 4.3 4.1  AST 0 - 40 IU/L 16 33 34  ALT 0 - 32 IU/L 19 32 50(H)  Alk Phosphatase 44 - 121 IU/L 94 89 99  Total Bilirubin 0.0 - 1.2 mg/dL 0.3 0.4 0.2    Lab Results  Component Value Date/Time   TSH 1.790 07/03/2019 02:28 PM   TSH 0.706 08/03/2018 01:43 PM    CBC Latest Ref Rng & Units 10/15/2020 08/29/2019 08/03/2018  WBC 3.4 - 10.8  x10E3/uL 8.8 9.4 8.0  Hemoglobin 11.1 - 15.9 g/dL 14.7 14.2 14.3  Hematocrit 34.0 - 46.6 % 45.4 43.9 41.2  Platelets 150 - 450 x10E3/uL 269 237 242    No results found for: VD25OH  Clinical ASCVD: No  The ASCVD Risk score (Arnett DK, et al., 2019) failed to calculate for the following reasons:   The valid total cholesterol range is 130 to 320 mg/dL    Other: (CHADS2VASc if Afib, PHQ9 if depression, MMRC or CAT for COPD, ACT, DEXA)  Social History   Tobacco Use  Smoking Status Every Day   Packs/day: 1.00   Years: 46.00   Pack years: 46.00   Types: Cigarettes  Smokeless Tobacco Never   BP Readings from Last 3 Encounters:  04/04/21 (!) 164/111  04/03/21 (!) 150/84  12/20/20 133/82   Pulse Readings from Last 3 Encounters:  04/04/21 81  04/03/21 80  12/20/20 82   Wt Readings from Last 3 Encounters:  04/15/21 188 lb (85.3 kg)  04/04/21 188 lb 12.8 oz (85.6 kg)  04/03/21 188 lb (85.3 kg)    Assessment: Review of patient past medical history, allergies, medications, health status, including review of consultants reports, laboratory and other test data, was performed as part of comprehensive evaluation and provision of chronic care management services.   SDOH:  (Social Determinants of Health) assessments and interventions performed:    CCM Care Plan  Allergies  Allergen Reactions   Ciprofloxacin Hcl Rash   Enbrel [Etanercept] Rash   Metformin And Related Diarrhea    Medications Reviewed Today     Reviewed by Lavera Guise, [facility] (Pharmacist) on 07/02/21 at 11  Med List Status: <None>   Medication Order Taking? Sig Documenting Provider Last Dose Status Informant  Alpha-Lipoic Acid 600 MG CAPS [id number] No Take 1 capsule (600 mg total) by mouth daily. For neuropathy [name], DO Taking Active   amLODipine (NORVASC) 5 MG tablet [id number]  TAKE ONE TABLET BY MOUTH EVERY DAY Ronnie Doss M, DO  Active   Ascorbic Acid (VITAMIN C) 500 MG CHEW [id number]  No 1 tablet [provider] Taking Active   aspirin 81 MG tablet [id number] No Take 81 mg by mouth daily. [provider] Taking Active Self  atenolol (TENORMIN) 25 MG tablet [id number]  TAKE 1 TABLET BY MOUTH DAILY. Ronnie Doss M, DO  Active   azelastine (OPTIVAR) 0.05 % ophthalmic solution [id number] No in the morning and at bedtime. [provider] Taking Active   budesonide (ENTOCORT EC) 3 MG 24 hr capsule [id number] No Take 3 capsules (9 mg total) by mouth daily. [name], [name], PA-C Taking Active   buPROPion (WELLBUTRIN XL) 300 MG 24 hr tablet [id number]  Take 1 tablet (300 mg total) by mouth daily. (NEEDS TO BE SEEN BEFORE NEXT REFILL) Ronnie Doss M, DO  Active   busPIRone (BUSPAR) 10 MG tablet [id number]  TAKE (1) TABLET TWICE A DAY. Ronnie Doss M, DO  Active   cholecalciferol (VITAMIN D) 1000 UNITS tablet [id number] No Take 1,000 Units by mouth daily. [provider] Taking Active Self  citalopram (CELEXA) 40 MG tablet [id number] No TAKE 1 TABLET EVERY DAY (Needs to be seen) [name], DO Taking Active   Cyanocobalamin (VITAMIN B 12 PO) [id number] No Take  by mouth. [provider] Taking Active   dapagliflozin propanediol (FARXIGA) 10 MG TABS tablet [id number]  Take 1 tablet (10 mg total) by mouth daily before breakfast. Ronnie Doss M, DO  Active   diclofenac (VOLTAREN) 75 MG EC tablet [id number]  TAKE ONE TABLET BY MOUTH TWICE DAILY AS NEEDED FOR MODERATE PAIN (USE SPARINGLY) Ronnie Doss M, DO  Active   diphenoxylate-atropine (LOMOTIL) 2.5-0.025 MG tablet [id number] No TAKE 1 OR 2 TABLETS FOUR TIMES DAILY AS NEEDED Gatha Mayer, MD Taking Active   folic acid (FOLVITE) 1 MG tablet [id number] No Take 1 mg by mouth daily. [provider] Taking Active   gabapentin (NEURONTIN) 600 MG tablet [id number]  Take 2 tablets (1,200 mg total) by mouth 3 (three) times daily. (NEEDS TO BE SEEN BEFORE NEXT REFILL) Ronnie Doss M, DO  Active   glipiZIDE (GLUCOTROL) 10 MG tablet [id number] No TAKE (1) TABLET TWICE A DAY BEFORE MEALS.  Patient not taking: Reported on 04/15/2021   [name], DO Not Taking Active   HYDROcodone-acetaminophen (NORCO/VICODIN) 5-325 MG per tablet [id number] No Take 1 tablet by mouth every 6 (six) hours as needed. Lysbeth Penner, FNP Taking Active Self  losartan (COZAAR) 100 MG tablet [id number]  TAKE 1 TABLET DAILY Ronnie Doss M, DO  Active   methotrexate (RHEUMATREX) 2.5 MG tablet [id number] No Take 15 mg by mouth once a week. [provider] Taking Active   omeprazole (PRILOSEC) 20 MG capsule [id number] No Take 1 capsule (20 mg total) by mouth daily. Please cancel protonix [name], DO Taking Active   Oyster Shell Calcium 500 MG TABS [id number] No 1 tablet with meals [provider] Taking Active   Pyridoxine HCl (VITAMIN B-6 PO) [id number] No Take by mouth. [provider] Taking Active   Semaglutide (RYBELSUS) 14 MG TABS [id number] No Take 14 mg by mouth daily. [name], DO Taking Active            Med Note Leisa Lenz May 14, 2021  1:57 PM) VIA NOVO [facility] PATIENT ASSISTANCE PROGRAM  simvastatin (ZOCOR) 40 MG tablet [id number] No TAKE 1 TABLET DAILY [name], DO Taking Active   SULFASALAZINE PO [id number] No Take by mouth. [provider] Taking Active            Med Note Georgiann Cocker, AMY E   Tue Apr 15, 2021  4:00 PM) 500 mg BID  zolpidem (AMBIEN) 5 MG tablet [id number] No Take 1 tablet at bedtime as needed for sleep (use sparingly). [name], DO Taking Active             Patient Active Problem List   Diagnosis Date Noted   Degeneration of lumbar intervertebral disc 04/15/2021   Osteopenia 04/15/2021   Primary osteoarthritis 04/15/2021   Carpal tunnel syndrome of right wrist 07/17/2020   Dysphagia 03/27/2020   Generalized anxiety disorder with panic attacks 09/01/2019    Hyperlipidemia associated with type 2 diabetes mellitus ([place]) 12/28/2018   Chronic pain of right knee 12/28/2018   Tobacco abuse 04/10/2016   Lymphocytic colitis 12/03/2015   DM2 (diabetes mellitus, type 2) ([place]) 10/11/2015   Depressive disorder 06/17/2015   Fatigue 06/17/2015   Weakness 06/03/2015   Diarrhea 05/13/2015   Hypertension associated with diabetes ([place])    OBESITY 01/21/2009   EROSIVE ESOPHAGITIS 01/21/2009   GASTROESOPHAGEAL REFLUX DISEASE, CHRONIC 01/21/2009   Rheumatoid arthritis ([place]) 01/21/2009   TENDINITIS 01/21/2009   EDEMA  01/21/2009    Immunization History  Administered Date(s) Administered   Fluad Quad(high Dose 65+) 05/03/2019   Influenza,inj,Quad PF,6+ Mos 05/25/2013, 07/31/2015, 06/18/2016, 05/16/2018   Influenza-Unspecified 05/07/2019, 07/24/2020, 05/08/2021   PPD Test 05/08/2013   Pneumococcal Conjugate-13 05/16/2018   Pneumococcal Polysaccharide-23 07/03/2019   Tdap 12/28/2018   Zoster Recombinat (Shingrix) 06/06/2019, 06/30/2019, 10/27/2019    Conditions to be addressed/monitored: HLD and DMII  Care Plan : PHARMD MEDICATION MANAGEMENT  Updates made by Lavera Guise, [place] since 07/02/2021 12:00 AM     Problem: DISEASE PROGRESSION PREVENTION      Long-Range Goal: T2DM, HLD   Recent Progress: Not on track  Priority: High  Note:   Current Barriers:  Unable to independently afford treatment regimen Unable to achieve control of T2DM   Pharmacist Clinical Goal(s):  Over the next 90 days, patient will verbalize ability to afford treatment regimen achieve control of T2DM as evidenced by IMPROVED GLYCEMIC CONTROL, GOAL A1C<7%  through collaboration with PharmD and provider.    Interventions: 1:1 collaboration with [name], DO regarding development and update of comprehensive plan of care as evidenced by provider attestation and co-signature Inter-disciplinary care team collaboration (see longitudinal plan of care) Comprehensive  medication review performed; medication list updated in electronic medical record  Diabetes: Uncontrolled; current treatment: RYBELSUS 14MG DAILY, FARXIGA 10MG, GLIPIZIDE (not taking);  GFR 66, A1C 9.5%-->11.8% (PATIENT HAS BEEN WITHOUT MEDICATION) CONTINUE FARXIGA 10MG (EASE OF PATIENT ASSISTANCE)--SAMPLES GIVEN APPLICATION SUBMITTED AZ&ME FOR FARXIGA MEDICATION TO SHIP TO PATIENT'S HOME (3 MONTH SUPPLY) CONTINUE RYBELSUS 37JI daily APPLICATION SUBMITTED FOR NOVO [place] PAP (RYBELSUS)--there was a mix up with company, they now have our address correct 10-14 business days for rybelsus to ship to us--samples given Denies personal and family history of Medullary thyroid cancer ([place]) Medication to ship to PCP office Discussed hyperlipidemia briefly LDL now at goal <70 Continue current regimen for now (SIMVA 40MG) Would prefer rosuvastatin, but no complaints from patient at this time Dietary/lifestyle modifications recommended Current glucose readings: fasting glucose: <160, post prandial glucose: <200s Denies hypoglycemic/hyperglycemic symptoms Discussed meal planning options and Plate method for healthy eating Avoid sugary drinks and desserts Incorporate balanced protein, non starchy veggies, 1 serving of carbohydrate with each meal Increase water intake Increase physical activity as able Current exercise: N/A Educated on [place] Assessed patient finances. APPLICATION SUBMITTED FOR AZ&ME (FARXIGA) AND NOVO [place] PATIENT ASSISTANCE PROGRAMS FOR 2022-23  Patient Goals/Self-Care Activities Over the next 90 days, patient will:  - take medications as prescribed collaborate with provider on medication access solutions  Follow Up Plan: Telephone follow up appointment with care management team member scheduled for: 1 MONTH      Medication Assistance: Application for FARXIGA/[place]&ME   medication assistance program. in process.  Anticipated assistance start date TBD.  See  plan of care for additional detail. RYBELSUS OBTAINED VIA NOVO [place] PATIENT ASSSISTANCE PROGRAM  Patient's preferred pharmacy is:  [facility], [place] [address] Phone: [phone] Fax: [phone]  [facility] [place], [place] [address] [place] [address] Phone: [phone] Fax: [phone]   Follow Up:  Patient agrees to Care Plan and Follow-up.  Plan: Telephone follow up appointment with care management team member scheduled for:  3 WEEKS   Regina Eck, PharmD, BCPS Clinical Pharmacist, [place]  II Phone [phone]

## 2021-07-02 NOTE — Patient Instructions (Signed)
Visit Information  Current Barriers:  Unable to independently afford treatment regimen Unable to achieve control of T2DM   Pharmacist Clinical Goal(s):  Over the next 90 days, patient will verbalize ability to afford treatment regimen achieve control of T2DM as evidenced by IMPROVED GLYCEMIC CONTROL, GOAL A1C<7% through collaboration with PharmD and provider.    Interventions: 1:1 collaboration with [name], DO regarding development and update of comprehensive plan of care as evidenced by provider attestation and co-signature Inter-disciplinary care team collaboration (see longitudinal plan of care) Comprehensive medication review performed; medication list updated in electronic medical record  Diabetes: Uncontrolled; current treatment: RYBELSUS 14MG  DAILY, FARXIGA 10MG , GLIPIZIDE (not taking);  GFR 66, A1C 9.5%-->11.8% (PATIENT HAS BEEN WITHOUT MEDICATION) CONTINUE FARXIGA 10MG  (EASE OF PATIENT ASSISTANCE)--SAMPLES GIVEN APPLICATION SUBMITTED AZ&ME FOR [place] (3 MONTH SUPPLY) CONTINUE RYBELSUS 14mg  daily APPLICATION SUBMITTED FOR NOVO [place] PAP (RYBELSUS)--there was a mix up with company, they now have our address correct 10-14 business days for rybelsus to ship to us--samples given Denies personal and family history of Medullary thyroid cancer (MTC) Medication to ship to PCP office Discussed hyperlipidemia briefly LDL now at goal <70 Continue current regimen for now (SIMVA 40MG ) Would prefer rosuvastatin, but no complaints from patient at this time Dietary/lifestyle modifications recommended Current glucose readings: fasting glucose: <160, post prandial glucose: <200s Denies hypoglycemic/hyperglycemic symptoms Discussed meal planning options and Plate method for healthy eating Avoid sugary drinks and desserts Incorporate balanced protein, non starchy veggies, 1 serving of carbohydrate with each meal Increase water  intake Increase physical activity as able Current exercise: N/A Educated on [place] Assessed patient finances. APPLICATION SUBMITTED FOR AZ&ME (FARXIGA) AND NOVO [place] PATIENT ASSISTANCE PROGRAMS FOR 2022-23  Patient Goals/Self-Care Activities Over the next 90 days, patient will:  - take medications as prescribed collaborate with provider on medication access solutions  Follow Up Plan: Telephone follow up appointment with care management team member scheduled for: 1 MONTH   The patient verbalized understanding of instructions, educational materials, and care plan provided today and declined offer to receive copy of patient instructions, educational materials, and care plan.   Regina Eck, PharmD, BCPS Clinical Pharmacist, [place]  II Phone [phone]

## 2021-07-15 ENCOUNTER — Ambulatory Visit (INDEPENDENT_AMBULATORY_CARE_PROVIDER_SITE_OTHER): Payer: Medicare Other | Admitting: Pharmacist

## 2021-07-15 DIAGNOSIS — E1169 Type 2 diabetes mellitus with other specified complication: Secondary | ICD-10-CM

## 2021-07-16 DIAGNOSIS — E1169 Type 2 diabetes mellitus with other specified complication: Secondary | ICD-10-CM

## 2021-07-16 DIAGNOSIS — E785 Hyperlipidemia, unspecified: Secondary | ICD-10-CM

## 2021-07-16 NOTE — Progress Notes (Signed)
Received notification from AZ&ME regarding approval for [facility]. Patient assistance approved from 07/08/21 to 08/16/21.  MEDICATION WILL SHIP TO PT  Phone: [phone]

## 2021-07-16 NOTE — Progress Notes (Signed)
Chronic Care Management Pharmacy Note  07/15/2021 Name:  Donna Rogers MRN:  [id number] DOB:  1950-08-31  Summary: T2DM  Recommendations/Changes made from today's visit:  Diabetes: Uncontrolled; current treatment: RYBELSUS 14MG DAILY, FARXIGA 10MG,  GFR 66, A1C 9.5%-->11.8% (PATIENT HAS BEEN WITHOUT MEDICATION) CONTINUE FARXIGA 10MG (EASE OF PATIENT ASSISTANCE)--SAMPLES GIVEN PATIENTS APPLICATION SUBMITTED AZ&ME FOR FARXIGA FOR 2023 MEDICATION TO SHIP TO PATIENT'S HOME (3 MONTH SUPPLY) CONTINUE RYBELSUS 82ME daily APPLICATION SUBMITTED FOR RE-ENROLLMENT IN NOVO [place] PAP (RYBELSUS) Denies personal and family history of Medullary thyroid cancer (MTC) Medication to ship to PCP office Discussed hyperlipidemia briefly LDL now at goal <70 Continue current regimen for now (SIMVA 40MG) Would prefer rosuvastatin, but no complaints from patient at this time Dietary/lifestyle modifications recommended Current glucose readings: fasting glucose: <160, post prandial glucose: <200s Denies hypoglycemic/hyperglycemic symptoms Discussed meal planning options and Plate method for healthy eating Avoid sugary drinks and desserts Incorporate balanced protein, non starchy veggies, 1 serving of carbohydrate with each meal Increase water intake Increase physical activity as able Current exercise: N/A Educated on TRANSITION TO [place] patient finances. APPLICATION SUBMITTED FOR RE-ENROLLMENT [place]&ME (FARXIGA) AND NOVO [place] PATIENT ASSISTANCE PROGRAMS FOR 2022-23  Patient Goals/Self-Care Activities Over the next 90 days, patient will:  - take medications as prescribed collaborate with provider on medication access solutions  Follow Up Plan: Telephone follow up appointment with care management team member scheduled for: 1 MONTH  Subjective: Donna Rogers is an 70 y.o. year old female who is a primary patient of [name], DO.  The CCM team was consulted for  assistance with disease management and care coordination needs.    Engaged with patient by telephone for follow up visit in response to provider referral for pharmacy case management and/or care coordination services.   Consent to Services:  The patient was given information about Chronic Care Management services, agreed to services, and gave verbal consent prior to initiation of services.  Please see initial visit note for detailed documentation.   Patient Care Team: [name], DO as PCP - General (Family Medicine) Shea Evans Norva Riffle, [name] as Social Worker (Licensed Clinical Social Worker) Blanca Friend, Royce Macadamia, [facility] as Pharmacist (Family Medicine) Gatha Mayer, MD as Consulting Physician (Gastroenterology) [facility] (Specialist)  Objective:  Lab Results  Component Value Date   CREATININE 0.78 04/04/2021   CREATININE 0.94 10/15/2020   CREATININE 0.98 09/08/2019    Lab Results  Component Value Date   HGBA1C 11.8 (H) 04/04/2021   Last diabetic Eye exam:  Lab Results  Component Value Date/Time   HMDIABEYEEXA No Retinopathy 04/04/2021 12:00 AM    Last diabetic Foot exam: No results found for: HMDIABFOOTEX      Component Value Date/Time   CHOL 123 10/15/2020 1134   TRIG 184 (H) 10/15/2020 1134   HDL 29 (L) 10/15/2020 1134   CHOLHDL 4.2 10/15/2020 1134   LDLCALC 63 10/15/2020 1134    Hepatic Function Latest Ref Rng & Units 10/15/2020 12/28/2018 08/03/2018  Total Protein 6.0 - 8.5 g/dL 6.9 7.3 6.8  Albumin 3.8 - 4.8 g/dL 4.0 4.3 4.1  AST 0 - 40 IU/L 16 33 34  ALT 0 - 32 IU/L 19 32 50(H)  Alk Phosphatase 44 - 121 IU/L 94 89 99  Total Bilirubin 0.0 - 1.2 mg/dL 0.3 0.4 0.2    Lab Results  Component Value Date/Time   TSH 1.790 07/03/2019 02:28 PM   TSH 0.706 08/03/2018 01:43 PM  CBC Latest Ref Rng & Units 10/15/2020 08/29/2019 08/03/2018  WBC 3.4 - 10.8 x10E3/uL 8.8 9.4 8.0  Hemoglobin 11.1 - 15.9 g/dL 14.7 14.2 14.3  Hematocrit 34.0 - 46.6 % 45.4 43.9 41.2   Platelets 150 - 450 x10E3/uL 269 237 242    No results found for: VD25OH  Clinical ASCVD: No  The ASCVD Risk score (Arnett DK, et al., 2019) failed to calculate for the following reasons:   The valid total cholesterol range is 130 to 320 mg/dL    Other: (CHADS2VASc if Afib, PHQ9 if depression, MMRC or CAT for COPD, ACT, DEXA)  Social History   Tobacco Use  Smoking Status Every Day   Packs/day: 1.00   Years: 46.00   Pack years: 46.00   Types: Cigarettes  Smokeless Tobacco Never   BP Readings from Last 3 Encounters:  04/04/21 (!) 164/111  04/03/21 (!) 150/84  12/20/20 133/82   Pulse Readings from Last 3 Encounters:  04/04/21 81  04/03/21 80  12/20/20 82   Wt Readings from Last 3 Encounters:  04/15/21 188 lb (85.3 kg)  04/04/21 188 lb 12.8 oz (85.6 kg)  04/03/21 188 lb (85.3 kg)    Assessment: Review of patient past medical history, allergies, medications, health status, including review of consultants reports, laboratory and other test data, was performed as part of comprehensive evaluation and provision of chronic care management services.   SDOH:  (Social Determinants of Health) assessments and interventions performed:    CCM Care Plan  Allergies  Allergen Reactions   Ciprofloxacin Hcl Rash   Enbrel [Etanercept] Rash   Metformin And Related Diarrhea    Medications Reviewed Today     Reviewed by Lavera Guise, [facility] (Pharmacist) on 07/02/21 at 90  Med List Status: <None>   Medication Order Taking? Sig Documenting Provider Last Dose Status Informant  Alpha-Lipoic Acid 600 MG CAPS [id number] No Take 1 capsule (600 mg total) by mouth daily. For neuropathy [name], DO Taking Active   amLODipine (NORVASC) 5 MG tablet [id number]  TAKE ONE TABLET BY MOUTH EVERY DAY Ronnie Doss M, DO  Active   Ascorbic Acid (VITAMIN C) 500 MG CHEW [id number] No 1 tablet [provider] Taking Active   aspirin 81 MG tablet [id number] No Take 81 mg by mouth  daily. [provider] Taking Active Self  atenolol (TENORMIN) 25 MG tablet [id number]  TAKE 1 TABLET BY MOUTH DAILY. Ronnie Doss M, DO  Active   azelastine (OPTIVAR) 0.05 % ophthalmic solution [id number] No in the morning and at bedtime. [provider] Taking Active   budesonide (ENTOCORT EC) 3 MG 24 hr capsule [id number] No Take 3 capsules (9 mg total) by mouth daily. [name], [name], PA-C Taking Active   buPROPion (WELLBUTRIN XL) 300 MG 24 hr tablet [id number]  Take 1 tablet (300 mg total) by mouth daily. (NEEDS TO BE SEEN BEFORE NEXT REFILL) Ronnie Doss M, DO  Active   busPIRone (BUSPAR) 10 MG tablet [id number]  TAKE (1) TABLET TWICE A DAY. Ronnie Doss M, DO  Active   cholecalciferol (VITAMIN D) 1000 UNITS tablet [id number] No Take 1,000 Units by mouth daily. [provider] Taking Active Self  citalopram (CELEXA) 40 MG tablet [id number] No TAKE 1 TABLET EVERY DAY (Needs to be seen) [name], DO Taking Active   Cyanocobalamin (VITAMIN B 12 PO) [id number] No Take by mouth. [provider] Taking Active   dapagliflozin propanediol (FARXIGA) 10 MG TABS tablet [id number]  Take 1  tablet (10 mg total) by mouth daily before breakfast. Ronnie Doss M, DO  Active   diclofenac (VOLTAREN) 75 MG EC tablet [id number]  TAKE ONE TABLET BY MOUTH TWICE DAILY AS NEEDED FOR MODERATE PAIN (USE SPARINGLY) Ronnie Doss M, DO  Active   diphenoxylate-atropine (LOMOTIL) 2.5-0.025 MG tablet [id number] No TAKE 1 OR 2 TABLETS FOUR TIMES DAILY AS NEEDED Gatha Mayer, MD Taking Active   folic acid (FOLVITE) 1 MG tablet [id number] No Take 1 mg by mouth daily. [provider] Taking Active   gabapentin (NEURONTIN) 600 MG tablet [id number]  Take 2 tablets (1,200 mg total) by mouth 3 (three) times daily. (NEEDS TO BE SEEN BEFORE NEXT REFILL) Ronnie Doss M, DO  Active   glipiZIDE (GLUCOTROL) 10 MG tablet [id number] No TAKE (1) TABLET TWICE A DAY BEFORE  MEALS.  Patient not taking: Reported on 04/15/2021   [name], DO Not Taking Active   HYDROcodone-acetaminophen (NORCO/VICODIN) 5-325 MG per tablet [id number] No Take 1 tablet by mouth every 6 (six) hours as needed. Lysbeth Penner, FNP Taking Active Self  losartan (COZAAR) 100 MG tablet [id number]  TAKE 1 TABLET DAILY Ronnie Doss M, DO  Active   methotrexate (RHEUMATREX) 2.5 MG tablet [id number] No Take 15 mg by mouth once a week. [provider] Taking Active   omeprazole (PRILOSEC) 20 MG capsule [id number] No Take 1 capsule (20 mg total) by mouth daily. Please cancel protonix [name], DO Taking Active   Oyster Shell Calcium 500 MG TABS [id number] No 1 tablet with meals [provider] Taking Active   Pyridoxine HCl (VITAMIN B-6 PO) [id number] No Take by mouth. [provider] Taking Active   Semaglutide (RYBELSUS) 14 MG TABS [id number] No Take 14 mg by mouth daily. [name], DO Taking Active            Med Note Leisa Lenz May 14, 2021  1:57 PM) VIA NOVO NORDISK PATIENT ASSISTANCE PROGRAM  simvastatin (ZOCOR) 40 MG tablet [id number] No TAKE 1 TABLET DAILY [name], DO Taking Active   SULFASALAZINE PO [id number] No Take by mouth. [provider] Taking Active            Med Note Georgiann Cocker, AMY E   Tue Apr 15, 2021  4:00 PM) 500 mg BID  zolpidem (AMBIEN) 5 MG tablet [id number] No Take 1 tablet at bedtime as needed for sleep (use sparingly). [name], DO Taking Active             Patient Active Problem List   Diagnosis Date Noted   Degeneration of lumbar intervertebral disc 04/15/2021   Osteopenia 04/15/2021   Primary osteoarthritis 04/15/2021   Carpal tunnel syndrome of right wrist 07/17/2020   Dysphagia 03/27/2020   Generalized anxiety disorder with panic attacks 09/01/2019   Hyperlipidemia associated with type 2 diabetes mellitus ([place]) 12/28/2018   Chronic pain of right knee  12/28/2018   Tobacco abuse 04/10/2016   Lymphocytic colitis 12/03/2015   DM2 (diabetes mellitus, type 2) ([place]) 10/11/2015   Depressive disorder 06/17/2015   Fatigue 06/17/2015   Weakness 06/03/2015   Diarrhea 05/13/2015   Hypertension associated with diabetes ([place])    OBESITY 01/21/2009   EROSIVE ESOPHAGITIS 01/21/2009   GASTROESOPHAGEAL REFLUX DISEASE, CHRONIC 01/21/2009   Rheumatoid arthritis ([place]) 01/21/2009   TENDINITIS 01/21/2009   EDEMA 01/21/2009    Immunization History  Administered Date(s) Administered   Fluad Quad(high Dose 65+) 05/03/2019   Influenza,inj,Quad  PF,6+ Mos 05/25/2013, 07/31/2015, 06/18/2016, 05/16/2018   Influenza-Unspecified 05/07/2019, 07/24/2020, 05/08/2021   PPD Test 05/08/2013   Pneumococcal Conjugate-13 05/16/2018   Pneumococcal Polysaccharide-23 07/03/2019   Tdap 12/28/2018   Zoster Recombinat (Shingrix) 06/06/2019, 06/30/2019, 10/27/2019    Conditions to be addressed/monitored: HLD and DMII  There are no care plans that you recently modified to display for this patient.   Medication Assistance: Application for RYBELSUS/NOVO [place] AND FARXIGA/[place]  medication assistance program. in process.  Anticipated assistance start date TBD-2023.  See plan of care for additional detail.  Patient's preferred pharmacy is:  [facility], [address] [address] Phone: [phone] Fax: [facility], [place] [address] [place] [address] Phone: [phone] Fax: [phone]  Uses pill box? No - N/A Pt endorses 80% compliance  Follow Up:  Patient agrees to Care Plan and Follow-up.  Plan: Telephone follow up appointment with care management team member scheduled for:  1 MONTH   Regina Eck, PharmD, BCPS Clinical Pharmacist, [place]  II Phone [phone]

## 2021-07-16 NOTE — Patient Instructions (Signed)
Visit Information  Thank you for taking time to visit with me today. Please don't hesitate to contact me if I can be of assistance to you before our next scheduled telephone appointment.  Following are the goals we discussed today:  Current Barriers:  Unable to independently afford treatment regimen Unable to achieve control of T2DM   Pharmacist Clinical Goal(s):  Over the next 90 days, patient will verbalize ability to afford treatment regimen achieve control of T2DM as evidenced by IMPROVED GLYCEMIC CONTROL, GOAL A1C<7% through collaboration with PharmD and provider.    Interventions: 1:1 collaboration with [name], DO regarding development and update of comprehensive plan of care as evidenced by provider attestation and co-signature Inter-disciplinary care team collaboration (see longitudinal plan of care) Comprehensive medication review performed; medication list updated in electronic medical record  Diabetes: Uncontrolled; current treatment: RYBELSUS 14MG  DAILY, FARXIGA 10MG ,  GFR 66, A1C 9.5%-->11.8% (PATIENT HAS BEEN WITHOUT MEDICATION) CONTINUE FARXIGA 10MG  (EASE OF PATIENT ASSISTANCE)--SAMPLES GIVEN PATIENTS APPLICATION SUBMITTED AZ&ME FOR FARXIGA FOR 2023 MEDICATION TO SHIP TO PATIENT'S HOME (3 MONTH SUPPLY) CONTINUE RYBELSUS 14mg  daily APPLICATION SUBMITTED FOR RE-ENROLLMENT IN NOVO [place] PAP (RYBELSUS) Denies personal and family history of Medullary thyroid cancer (MTC) Medication to ship to PCP office Discussed hyperlipidemia briefly LDL now at goal <70 Continue current regimen for now (SIMVA 40MG ) Would prefer rosuvastatin, but no complaints from patient at this time Dietary/lifestyle modifications recommended Current glucose readings: fasting glucose: <160, post prandial glucose: <200s Denies hypoglycemic/hyperglycemic symptoms Discussed meal planning options and Plate method for healthy eating Avoid sugary drinks and desserts Incorporate balanced  protein, non starchy veggies, 1 serving of carbohydrate with each meal Increase water intake Increase physical activity as able Current exercise: N/A Educated on TRANSITION TO [place] patient finances. APPLICATION SUBMITTED FOR RE-ENROLLMENT AZ&ME (FARXIGA) AND NOVO [employer] PATIENT ASSISTANCE PROGRAMS FOR 2022-23  Patient Goals/Self-Care Activities Over the next 90 days, patient will:  - take medications as prescribed collaborate with provider on medication access solutions  Follow Up Plan: Telephone follow up appointment with care management team member scheduled for: 1 MONTH   Please call the care guide team at [phone] if you need to cancel or reschedule your appointment.   The patient verbalized understanding of instructions, educational materials, and care plan provided today and declined offer to receive copy of patient instructions, educational materials, and care plan.   Regina Eck, PharmD, BCPS Clinical Pharmacist, [place]  II Phone [phone]

## 2021-07-18 ENCOUNTER — Other Ambulatory Visit: Payer: Self-pay | Admitting: Family Medicine

## 2021-07-24 ENCOUNTER — Ambulatory Visit: Payer: Medicare Other | Admitting: Licensed Clinical Social Worker

## 2021-07-24 DIAGNOSIS — E1159 Type 2 diabetes mellitus with other circulatory complications: Secondary | ICD-10-CM

## 2021-07-24 DIAGNOSIS — I152 Hypertension secondary to endocrine disorders: Secondary | ICD-10-CM

## 2021-07-24 DIAGNOSIS — F411 Generalized anxiety disorder: Secondary | ICD-10-CM

## 2021-07-24 DIAGNOSIS — E1169 Type 2 diabetes mellitus with other specified complication: Secondary | ICD-10-CM

## 2021-07-24 DIAGNOSIS — F410 Panic disorder [episodic paroxysmal anxiety] without agoraphobia: Secondary | ICD-10-CM

## 2021-07-24 DIAGNOSIS — F32A Depression, unspecified: Secondary | ICD-10-CM

## 2021-07-24 NOTE — Patient Instructions (Addendum)
Visit Information  Patient Goals:   Manage My Emotions (Patient).  Manage Depression symptoms.  Manage Anxiety issues  Timeframe:  Short-Term Goal Priority:  Medium Progress: On Track Start Date:           07/24/21                Expected End Date:         10/16/21            Follow Up Date 09/19/21 at 10:00 AM   Manage My Emotions (Patient)   Manage Depression Symptoms. Manage Anxiety issues   Why is this important?   When you are stressed, down or upset, your body reacts too.  For example, your blood pressure may get higher; you may have a headache or stomachache.  When your emotions get the best of you, your body's ability to fight off cold and flu gets weak.  These steps will help you manage your emotions.    Patient Self Care Activities:  Self administers medications as prescribed Attends all scheduled provider appointments Performs ADL's independently  Patient Coping Strengths:  Family Friends  Patient Self Care Deficits:  Depression issues Anxiety issues  Patient Goals:  - spend time or talk with others at least 2 to 3 times per week - practice relaxation or meditation daily - keep a calendar with appointment dates  Follow Up Plan: [name] to call client or [name] on 09/19/21 at 10:00 AM to assess client needs   Norva Riffle.Faria Casella MSW, [name] Licensed Clinical Social Worker [facility] Care Management [phone]

## 2021-07-24 NOTE — Chronic Care Management (AMB) (Signed)
Chronic Care Management    Clinical Social Work Note  07/24/2021 Name: Donna Rogers MRN: [id number] DOB: 1951-08-17  Donna Rogers is a 70 y.o. year old female who is a primary care patient of Donna Norlander, DO. The CCM team was consulted to assist the patient with chronic disease management and/or care coordination needs related to: [employer] .   Engaged with patient / Donna Rogers, significant other of client, by telephone for follow up visit in response to provider referral for social work chronic care management and care coordination services.   Consent to Services:  The patient was given information about Chronic Care Management services, agreed to services, and gave verbal consent prior to initiation of services.  Please see initial visit note for detailed documentation.   Patient agreed to services and consent obtained.   Assessment: Review of patient past medical history, allergies, medications, and health status, including review of relevant consultants reports was performed today as part of a comprehensive evaluation and provision of chronic care management and care coordination services.     SDOH (Social Determinants of Health) assessments and interventions performed:  SDOH Interventions    Flowsheet Row Most Recent Value  SDOH Interventions   Physical Activity Interventions Other (Comments)  [client has walking challenges.client has a cane and a walker to use as needed to help her walk]  Stress Interventions Provide Counseling  [client has stress related to managing Diabetes and managing other medical conditions]  Depression Interventions/Treatment  Currently on Treatment        Advanced Directives Status: See [place] application for related entries.  CCM Care Plan  Allergies  Allergen Reactions   Ciprofloxacin Hcl Rash   Enbrel [Etanercept] Rash   Metformin And Related Diarrhea    Outpatient Encounter Medications as of 07/24/2021  Medication  Sig Note   Alpha-Lipoic Acid 600 MG CAPS Take 1 capsule (600 mg total) by mouth daily. For neuropathy    amLODipine (NORVASC) 5 MG tablet TAKE ONE TABLET BY MOUTH EVERY DAY    Ascorbic Acid (VITAMIN C) 500 MG CHEW 1 tablet    aspirin 81 MG tablet Take 81 mg by mouth daily.    atenolol (TENORMIN) 25 MG tablet TAKE 1 TABLET BY MOUTH DAILY.    azelastine (OPTIVAR) 0.05 % ophthalmic solution in the morning and at bedtime.    budesonide (ENTOCORT EC) 3 MG 24 hr capsule Take 3 capsules (9 mg total) by mouth daily.    buPROPion (WELLBUTRIN XL) 300 MG 24 hr tablet Take 1 tablet (300 mg total) by mouth daily. (NEEDS TO BE SEEN BEFORE NEXT REFILL)    busPIRone (BUSPAR) 10 MG tablet TAKE (1) TABLET TWICE A DAY.    cholecalciferol (VITAMIN D) 1000 UNITS tablet Take 1,000 Units by mouth daily.    citalopram (CELEXA) 40 MG tablet TAKE 1 TABLET EVERY DAY (Needs to be seen)    Cyanocobalamin (VITAMIN B 12 PO) Take by mouth.    dapagliflozin propanediol (FARXIGA) 10 MG TABS tablet Take 1 tablet (10 mg total) by mouth daily before breakfast. 07/16/2021: Via AZ&me patient assistance program   diclofenac (VOLTAREN) 75 MG EC tablet TAKE ONE TABLET BY MOUTH TWICE DAILY AS NEEDED FOR MODERATE PAIN (USE SPARINGLY)    diphenoxylate-atropine (LOMOTIL) 2.5-0.025 MG tablet TAKE 1 OR 2 TABLETS FOUR TIMES DAILY AS NEEDED    folic acid (FOLVITE) 1 MG tablet Take 1 mg by mouth daily.    gabapentin (NEURONTIN) 600 MG tablet Take 2 tablets (1,200  mg total) by mouth 3 (three) times daily. (NEEDS TO BE SEEN BEFORE NEXT REFILL)    HYDROcodone-acetaminophen (NORCO/VICODIN) 5-325 MG per tablet Take 1 tablet by mouth every 6 (six) hours as needed.    losartan (COZAAR) 100 MG tablet TAKE 1 TABLET DAILY    methotrexate (RHEUMATREX) 2.5 MG tablet Take 15 mg by mouth once a week.    omeprazole (PRILOSEC) 20 MG capsule Take 1 capsule (20 mg total) by mouth daily. Please cancel protonix    Oyster Shell Calcium 500 MG TABS 1 tablet with  meals    Pyridoxine HCl (VITAMIN B-6 PO) Take by mouth.    Semaglutide (RYBELSUS) 14 MG TABS Take 14 mg by mouth daily. 05/14/2021: VIA NOVO [facility]ISK PATIENT ASSISTANCE PROGRAM   simvastatin (ZOCOR) 40 MG tablet TAKE 1 TABLET DAILY    SULFASALAZINE PO Take by mouth. 04/15/2021: 500 mg BID   zolpidem (AMBIEN) 5 MG tablet Take 1 tablet at bedtime as needed for sleep (use sparingly).    No facility-administered encounter medications on file as of 07/24/2021.    Patient Active Problem List   Diagnosis Date Noted   Degeneration of lumbar intervertebral disc 04/15/2021   Osteopenia 04/15/2021   Primary osteoarthritis 04/15/2021   Carpal tunnel syndrome of right wrist 07/17/2020   Dysphagia 03/27/2020   Generalized anxiety disorder with panic attacks 09/01/2019   Hyperlipidemia associated with type 2 diabetes mellitus ([place]) 12/28/2018   Chronic pain of right knee 12/28/2018   Tobacco abuse 04/10/2016   Lymphocytic colitis 12/03/2015   DM2 (diabetes mellitus, type 2) ([place]) 10/11/2015   Depressive disorder 06/17/2015   Fatigue 06/17/2015   Weakness 06/03/2015   Diarrhea 05/13/2015   Hypertension associated with diabetes ([place])    OBESITY 01/21/2009   EROSIVE ESOPHAGITIS 01/21/2009   GASTROESOPHAGEAL REFLUX DISEASE, CHRONIC 01/21/2009   Rheumatoid arthritis ([place]) 01/21/2009   TENDINITIS 01/21/2009   EDEMA 01/21/2009    Conditions to be addressed/monitored: monitor client management of anxiety issues and of depression issues  Care Plan : [name] Care plan  Updates made by Donna Cabal, [name] since 07/24/2021 12:00 AM     Problem: Emotional Distress      Goal: Emotional Health Supported;Manage Depression symptoms. Manage Anxiety symptoms   Start Date: 07/24/2021  Expected End Date: 10/16/2021  This Visit's Progress: On track  Recent Progress: On track  Priority: Medium  Note:   Current Barriers:  Chronic Mental Health needs related to anxiety and depression Challenges in managing  Diabetes Mobility issues Suicidal Ideation/Homicidal Ideation: No  Clinical Social Work Goal(s):  patient will work with SW monthly by telephone or in person to reduce or manage symptoms related to anxiety and depression issues faced patient will work with SW monthly to discuss self care, involvement in recreational activities, and client stress management strategies  Interventions: 1:1 collaboration with Donna Norlander, DO regarding development and update of comprehensive plan of care as evidenced by provider attestation and co-signature Discussed client needs with Donna Rogers, significant other for client. Reviewed mobility of client with Donna Rogers. Milbert Coulter said client has a cane and a walker to use to help her walk. Discussed medication procurement with client.  Discussed mood status of client. Milbert Coulter said he thought mood of client was stable.  He said client is taking medications as prescribed. Reviewed transport needs of client. Discussed sleeping issues of client Encouraged client to continue to talk with Dr. Lottie Dawson, [facility] pharmacist, about Diabetes medication needs and medication costs for client Discussed  occasional dizziness of client with Milbert Coulter  Patient Self Care Activities:  Self administers medications as prescribed Attends all scheduled provider appointments Performs ADL's independently  Patient Coping Strengths:  Family Friends  Patient Self Care Deficits:  Depression issues Anxiety issues  Patient Goals:  - spend time or talk with others at least 2 to 3 times per week - practice relaxation or meditation daily - keep a calendar with appointment dates  Follow Up Plan: [name] to call client or Donna Rogers on 09/19/21 at 10:00 AM to assess client needs      Norva Riffle.Rosamaria Donn MSW, [name] Licensed Clinical Social Worker [facility] Care Management [phone]

## 2021-07-25 ENCOUNTER — Telehealth: Payer: Self-pay | Admitting: Family Medicine

## 2021-07-25 NOTE — Telephone Encounter (Signed)
MAILBOX FULL

## 2021-07-25 NOTE — Telephone Encounter (Signed)
Please let patient know: Farxiga samples left tup front

## 2021-07-26 ENCOUNTER — Other Ambulatory Visit: Payer: Self-pay | Admitting: Family Medicine

## 2021-07-26 DIAGNOSIS — F32A Depression, unspecified: Secondary | ICD-10-CM

## 2021-07-26 DIAGNOSIS — F5104 Psychophysiologic insomnia: Secondary | ICD-10-CM

## 2021-07-28 ENCOUNTER — Encounter: Payer: Self-pay | Admitting: Family Medicine

## 2021-07-28 NOTE — Telephone Encounter (Signed)
Tried to call pt to let her know she needs to be seen No answer and no voice mail

## 2021-07-28 NOTE — Telephone Encounter (Signed)
[place]. NTBS 30 days given on 06/25/21 other med is a controlled

## 2021-07-28 NOTE — Telephone Encounter (Signed)
Mailed letter °

## 2021-07-29 ENCOUNTER — Telehealth: Payer: Self-pay | Admitting: Family Medicine

## 2021-07-29 ENCOUNTER — Telehealth: Payer: Self-pay | Admitting: *Deleted

## 2021-07-29 NOTE — Progress Notes (Signed)
Received notification from [facility] regarding RE-ENROLLMENT approval for RYBELSUS. Patient assistance approved from 08/17/21 to 08/16/22.  FIRST SHIPMENT WILL GO OUT FIRST WEEK OF January 2023  Phone: [phone]

## 2021-07-29 NOTE — Telephone Encounter (Signed)
Pt attempted   Rybelsus here for pt assistance #2 bottles up front - NA or VM for pt

## 2021-07-29 NOTE — Telephone Encounter (Signed)
Pt called and will come pick up [place] samples tomorrow but says ever since she stopped taking Jardiance and started taking Farxiga her sugar has been up. Wants to know if she can go back to taking Jardiance.  Wants to speak with Almyra Free ASAP

## 2021-07-30 ENCOUNTER — Telehealth: Payer: Self-pay | Admitting: Family Medicine

## 2021-07-30 NOTE — Telephone Encounter (Signed)
Pt wants Jardiance and not Farxiga samples. Tried to attache to message from [facility] but it would not. Please call back.

## 2021-07-30 NOTE — Telephone Encounter (Signed)
Patient aware.

## 2021-07-30 NOTE — Telephone Encounter (Signed)
Returned call to patient  No answer x2

## 2021-07-31 ENCOUNTER — Telehealth: Payer: Self-pay | Admitting: Family Medicine

## 2021-07-31 ENCOUNTER — Ambulatory Visit (INDEPENDENT_AMBULATORY_CARE_PROVIDER_SITE_OTHER): Payer: Medicare Other | Admitting: Pharmacist

## 2021-07-31 DIAGNOSIS — E785 Hyperlipidemia, unspecified: Secondary | ICD-10-CM

## 2021-07-31 DIAGNOSIS — E1169 Type 2 diabetes mellitus with other specified complication: Secondary | ICD-10-CM

## 2021-07-31 NOTE — Telephone Encounter (Signed)
Contacted patient and notified.  Patient verbalized understanding

## 2021-07-31 NOTE — Telephone Encounter (Signed)
CALL RETURNED SEE PHARMD NOTE

## 2021-07-31 NOTE — Progress Notes (Signed)
Chronic Care Management Pharmacy Note  07/31/2021 Name:  Donna Rogers MRN:  [id number] DOB:  [birth year]  Summary: T2DM ,HLD  Recommendations/Changes made from today's visit: Diabetes: Uncontrolled; current treatment: RYBELSUS 14MG DAILY, JARDIANCE 25MG,  GFR 66, A1C 9.5%-->11.8% (PATIENT HAS BEEN WITHOUT MEDICATION) SWITCH TO JARDIANCE-->PATIENT STATES FARXIGA ISN'T WORKING WILL TRIAL JARDIANCE SAMPLES BUT UNLIKELY THE CAUSE OF HIGHER BLOOD SUGARS CONTINUE RYBELSUS 97m daily [place] RYBEL[place]. Patient assistance approved from 08/17/21 to 08/16/22.   FIRST SHIPMENT WILL GO OUT FIRST WEEK OF January 2023; Medication to ship to PCP office   Phone: [phone] personal and family history of Medullary thyroid cancer (MTC) Discussed hyperlipidemia briefly LDL now at goal <70 Continue current regimen for now (SIMVA 40MG) Would prefer rosuvastatin, but no complaints from patient at this time Dietary/lifestyle modifications recommended Current glucose readings: fasting glucose: <160, post prandial glucose: <200s Denies hypoglycemic/hyperglycemic symptoms Discussed meal planning options and Plate method for healthy eating Avoid sugary drinks and desserts Incorporate balanced protein, non starchy veggies, 1 serving of carbohydrate with each meal Increase water intake Increase physical activity as able Current exercise: N/A Educated on [place] PATIENT'S BLOOD SGUARS ARE >400 TODAY, DENIES ILLNESS AND N/V, INSTRUCTED PATIENT ON WHEN SHE SHOULD GO TO ER; OFFERED TO START INSULIN TODAY IN OFFICE BUT PATIENT DECLINED; ENCOURAGED WATER, LOW CARB DIET Assessed patient finances. [place] FOR 2022-23  Patient Goals/Self-Care Activities Over the next 90 days, patient will:  - take medications as prescribed collaborate with provider on medication access  solutions  Follow Up Plan: Telephone follow up appointment with care management team member scheduled for: 1 MONTH  Subjective: Donna MANZIis an 70y.o. year old female who is a primary patient of GJanora Norlander DO.  The CCM team was consulted for assistance with disease management and care coordination needs.    Engaged with patient by telephone for follow up visit in response to provider referral for pharmacy case management and/or care coordination services.   Consent to Services:  The patient was given information about Chronic Care Management services, agreed to services, and gave verbal consent prior to initiation of services.  Please see initial visit note for detailed documentation.   Patient Care Team: GJanora Norlander DO as PCP - General (Family Medicine) FShea Evans MNorva Riffle [name] as Social Worker (Licensed Clinical Social Worker) PBlanca Friend JRoyce Macadamia [facility] Pharmacist (Family Medicine) GGatha Mayer MD as Consulting Physician (Gastroenterology) [facility] (Specialist)  Objective:  Lab Results  Component Value Date   CREATININE 0.78 04/04/2021   CREATININE 0.94 10/15/2020   CREATININE 0.98 09/08/2019    Lab Results  Component Value Date   HGBA1C 11.8 (H) 04/04/2021   Last diabetic Eye exam:  Lab Results  Component Value Date/Time   HMDIABEYEEXA No Retinopathy 04/04/2021 12:00 AM    Last diabetic Foot exam: No results found for: HMDIABFOOTEX      Component Value Date/Time   CHOL 123 10/15/2020 1134   TRIG 184 (H) 10/15/2020 1134   HDL 29 (L) 10/15/2020 1134   CHOLHDL 4.2 10/15/2020 1134   LDLCALC 63 10/15/2020 1134    Hepatic Function Latest Ref Rng & Units 10/15/2020 12/28/2018 08/03/2018  Total Protein 6.0 - 8.5 g/dL 6.9 7.3 6.8  Albumin 3.8 - 4.8 g/dL 4.0 4.3 4.1  AST 0 - 40 IU/L 16 33 34  ALT 0 - 32 IU/L 19 32 50(H)  Alk Phosphatase 44 - 121 IU/L 94 89 99  Total Bilirubin 0.0 - 1.2 mg/dL 0.3 0.4 0.2    Lab Results  Component Value  Date/Time   TSH 1.790 07/03/2019 02:28 PM   TSH 0.706 08/03/2018 01:43 PM    CBC Latest Ref Rng & Units 10/15/2020 08/29/2019 08/03/2018  WBC 3.4 - 10.8 x10E3/uL 8.8 9.4 8.0  Hemoglobin 11.1 - 15.9 g/dL 14.7 14.2 14.3  Hematocrit 34.0 - 46.6 % 45.4 43.9 41.2  Platelets 150 - 450 x10E3/uL 269 237 242    No results found for: VD25OH  Clinical ASCVD: No  The ASCVD Risk score (Arnett DK, et al., 2019) failed to calculate for the following reasons:   The valid total cholesterol range is 130 to 320 mg/dL    Other: (CHADS2VASc if Afib, PHQ9 if depression, MMRC or CAT for COPD, ACT, DEXA)  Social History   Tobacco Use  Smoking Status Every Day   Packs/day: 1.00   Years: 46.00   Pack years: 46.00   Types: Cigarettes  Smokeless Tobacco Never   BP Readings from Last 3 Encounters:  04/04/21 (!) 164/111  04/03/21 (!) 150/84  12/20/20 133/82   Pulse Readings from Last 3 Encounters:  04/04/21 81  04/03/21 80  12/20/20 82   Wt Readings from Last 3 Encounters:  04/15/21 188 lb (85.3 kg)  04/04/21 188 lb 12.8 oz (85.6 kg)  04/03/21 188 lb (85.3 kg)    Assessment: Review of patient past medical history, allergies, medications, health status, including review of consultants reports, laboratory and other test data, was performed as part of comprehensive evaluation and provision of chronic care management services.   SDOH:  (Social Determinants of Health) assessments and interventions performed:    CCM Care Plan  Allergies  Allergen Reactions   Ciprofloxacin Hcl Rash   Enbrel [Etanercept] Rash   Metformin And Related Diarrhea    Medications Reviewed Today     Reviewed by Lavera Guise, [facility] (Pharmacist) on 07/31/21 at 1225  Med List Status: <None>   Medication Order Taking? Sig Documenting Provider Last Dose Status Informant  Alpha-Lipoic Acid 600 MG CAPS [id number]  Take 1 capsule (600 mg total) by mouth daily. For neuropathy [name], DO  Active   amLODipine  (NORVASC) 5 MG tablet [id number]  TAKE ONE TABLET BY MOUTH EVERY DAY Ronnie Doss M, DO  Active   Ascorbic Acid (VITAMIN C) 500 MG CHEW [id number]  1 tablet [provider]  Active   aspirin 81 MG tablet [id number]  Take 81 mg by mouth daily. [provider]  Active Self  atenolol (TENORMIN) 25 MG tablet [id number]  TAKE 1 TABLET BY MOUTH DAILY. Ronnie Doss M, DO  Active   azelastine (OPTIVAR) 0.05 % ophthalmic solution [id number]  in the morning and at bedtime. [provider]  Active   budesonide (ENTOCORT EC) 3 MG 24 hr capsule [id number]  Take 3 capsules (9 mg total) by mouth daily. [name], [name], PA-C  Active   buPROPion (WELLBUTRIN XL) 300 MG 24 hr tablet [id number]  Take 1 tablet (300 mg total) by mouth daily. (NEEDS TO BE SEEN BEFORE NEXT REFILL) Ronnie Doss M, DO  Active   busPIRone (BUSPAR) 10 MG tablet [id number]  TAKE (1) TABLET TWICE A DAY. Ronnie Doss M, DO  Active   cholecalciferol (VITAMIN D) 1000 UNITS tablet [id number]  Take 1,000 Units by mouth daily. [provider]  Active Self  citalopram (CELEXA) 40 MG tablet [id number]  TAKE 1 TABLET EVERY DAY (Needs to be seen) [name], DO  Active   Cyanocobalamin (VITAMIN B 12 PO) [id number]  Take by mouth. [provider]  Active   diclofenac (VOLTAREN) 75 MG EC tablet [id number]  TAKE ONE TABLET BY MOUTH TWICE DAILY AS NEEDED FOR MODERATE PAIN (USE SPARINGLY) Ronnie Doss M, DO  Active   diphenoxylate-atropine (LOMOTIL) 2.5-0.025 MG tablet [id number]  TAKE 1 OR 2 TABLETS FOUR TIMES DAILY AS NEEDED Gatha Mayer, MD  Active   empagliflozin (JARDIANCE) 25 MG TABS tablet [id number] Yes Take 25 mg by mouth daily. [provider]  Active            Med Note Parthenia Ames Jul 31, 2021 46:50 PM) SAMPLES  folic acid (FOLVITE) 1 MG tablet [id number]  Take 1 mg by mouth daily. [provider]  Active   gabapentin (NEURONTIN) 600 MG tablet [id number]   Take 2 tablets (1,200 mg total) by mouth 3 (three) times daily. (NEEDS TO BE SEEN BEFORE NEXT REFILL) Ronnie Doss M, DO  Active   HYDROcodone-acetaminophen (NORCO/VICODIN) 5-325 MG per tablet [id number]  Take 1 tablet by mouth every 6 (six) hours as needed. Lysbeth Penner, FNP  Active Self  losartan (COZAAR) 100 MG tablet [id number]  TAKE 1 TABLET DAILY Ronnie Doss M, DO  Active   methotrexate (RHEUMATREX) 2.5 MG tablet [id number]  Take 15 mg by mouth once a week. [provider]  Active   omeprazole (PRILOSEC) 20 MG capsule [id number]  Take 1 capsule (20 mg total) by mouth daily. Please cancel protonix [name], DO  Active   Oyster Shell Calcium 500 MG TABS [id number]  1 tablet with meals [provider]  Active   Pyridoxine HCl (VITAMIN B-6 PO) [id number]  Take by mouth. [provider]  Active   Semaglutide (RYBELSUS) 14 MG TABS [id number]  Take 14 mg by mouth daily. [name], DO  Active            Med Note Leisa Lenz May 14, 2021  1:57 PM) VIA NOVO [place] PATIENT ASSISTANCE PROGRAM  simvastatin (ZOCOR) 40 MG tablet [id number]  TAKE 1 TABLET DAILY [name], DO  Active   SULFASALAZINE PO [id number]  Take by mouth. [provider]  Active            Med Note Georgiann Cocker, AMY E   Tue Apr 15, 2021  4:00 PM) 500 mg BID  zolpidem (AMBIEN) 5 MG tablet [id number]  Take 1 tablet at bedtime as needed for sleep (use sparingly). [name], DO  Active             Patient Active Problem List   Diagnosis Date Noted   Degeneration of lumbar intervertebral disc 04/15/2021   Osteopenia 04/15/2021   Primary osteoarthritis 04/15/2021   Carpal tunnel syndrome of right wrist 07/17/2020   Dysphagia 03/27/2020   Generalized anxiety disorder with panic attacks 09/01/2019   Hyperlipidemia associated with type 2 diabetes mellitus ([place]) 12/28/2018   Chronic pain of right knee 12/28/2018   Tobacco abuse 04/10/2016    Lymphocytic colitis 12/03/2015   DM2 (diabetes mellitus, type 2) ([place]) 10/11/2015   Depressive disorder 06/17/2015   Fatigue 06/17/2015   Weakness 06/03/2015   Diarrhea 05/13/2015   Hypertension associated with diabetes ([place])    OBESITY 01/21/2009   EROSIVE ESOPHAGITIS 01/21/2009   GASTROESOPHAGEAL REFLUX DISEASE, CHRONIC 01/21/2009   Rheumatoid arthritis (  [place]) 01/21/2009   TENDINITIS 01/21/2009   EDEMA 01/21/2009    Immunization History  Administered Date(s) Administered   Fluad Quad(high Dose 65+) 05/03/2019   Influenza,inj,Quad PF,6+ Mos 05/25/2013, 07/31/2015, 06/18/2016, 05/16/2018   Influenza-Unspecified 05/07/2019, 07/24/2020, 05/08/2021   PPD Test 05/08/2013   Pneumococcal Conjugate-13 05/16/2018   Pneumococcal Polysaccharide-23 07/03/2019   Tdap 12/28/2018   Zoster Recombinat (Shingrix) 06/06/2019, 06/30/2019, 10/27/2019    Conditions to be addressed/monitored: HLD and DMII  Care Plan : PHARMD MEDICATION MANAGEMENT  Updates made by Lavera Guise, [place] since 07/31/2021 12:00 AM     Problem: DISEASE PROGRESSION PREVENTION      Long-Range Goal: T2DM, HLD   Recent Progress: Not on track  Priority: High  Note:   Current Barriers:  Unable to independently afford treatment regimen Unable to achieve control of T2DM   Pharmacist Clinical Goal(s):  Over the next 90 days, patient will verbalize ability to afford treatment regimen achieve control of T2DM as evidenced by IMPROVED GLYCEMIC CONTROL, GOAL A1C<7%  through collaboration with PharmD and provider.    Interventions: 1:1 collaboration with [name], DO regarding development and update of comprehensive plan of care as evidenced by provider attestation and co-signature Inter-disciplinary care team collaboration (see longitudinal plan of care) Comprehensive medication review performed; medication list updated in electronic medical record  Diabetes: Uncontrolled; current treatment: RYBELSUS 14MG  DAILY, JARDIANCE 25MG,  GFR 66, A1C 9.5%-->11.8% (PATIENT HAS BEEN WITHOUT MEDICATION) SWITCH TO JARDIANCE-->PATIENT STATES FARXIGA ISN'T WORKING WILL TRIAL JARDIANCE SAMPLES BUT UNLIKELY THE CAUSE OF HIGHER BLOOD SUGARS CONTINUE RYBELSUS 77m daily [place] for RYBEL[place]. Patient assistance approved from 08/17/21 to 08/16/22.   FIRST SHIPMENT WILL GO OUT FIRST WEEK OF January 2023; Medication to ship to PCP office   Phone: [phone] personal and family history of Medullary thyroid cancer (MTC) Discussed hyperlipidemia briefly LDL now at goal <70 Continue current regimen for now (SIMVA 40MG) Would prefer rosuvastatin, but no complaints from patient at this time Dietary/lifestyle modifications recommended Current glucose readings: fasting glucose: <160, post prandial glucose: <200s Denies hypoglycemic/hyperglycemic symptoms Discussed meal planning options and Plate method for healthy eating Avoid sugary drinks and desserts Incorporate balanced protein, non starchy veggies, 1 serving of carbohydrate with each meal Increase water intake Increase physical activity as able Current exercise: N/A [place] PATIENT'S BLOOD SGUARS ARE >400 TODAY, DENIES ILLNESS AND N/V, INSTRUCTED PATIENT ON WHEN SHE SHOULD GO TO ER; OFFERED TO START INSULIN TODAY IN OFFICE BUT PATIENT DECLINED; ENCOURAGED WATER, LOW CARB DIET Assessed patient finances. NOVO [place] ASS[place] PROGRAMS FOR 2022-23  Patient Goals/Self-Care Activities Over the next 90 days, patient will:  - take medications as prescribed collaborate with provider on medication access solutions  Follow Up Plan: Telephone follow up appointment with care management team member scheduled for: 1 MONTH      Medication Assistance:  RYBELSUS [place].  Enrollment ends 08/16/22  Patient's  preferred pharmacy is:  [address]Phone: [phone]: [address] [place]Phone: [phone]: [phone] Follow Up:  Patient agrees to Care Plan and Follow-up.  Plan: Telephone follow up appointment with care management team member scheduled for:  1 MONTH INSTRUCTED PATIENT TO CALL  ME LATER TODAY AND TOMORROW WITH BLOOD SUGAR READINGS  Regina Eck, PharmD, BCPS Clinical Pharmacist, [place]  II Phone [phone]

## 2021-07-31 NOTE — Telephone Encounter (Signed)
RETURNED CALL SEE PHARMD NOTE 07/31/21

## 2021-07-31 NOTE — Patient Instructions (Signed)
Visit Information  Thank you for taking time to visit with me today. Please don't hesitate to contact me if I can be of assistance to you before our next scheduled telephone appointment.  Following are the goals we discussed today:  Current Barriers:  Unable to independently afford treatment regimen Unable to achieve control of T2DM   Pharmacist Clinical Goal(s):  Over the next 90 days, patient will verbalize ability to afford treatment regimen achieve control of T2DM as evidenced by IMPROVED GLYCEMIC CONTROL, GOAL A1C<7% through collaboration with PharmD and provider.    Interventions: 1:1 collaboration with [name], DO regarding development and update of comprehensive plan of care as evidenced by provider attestation and co-signature Inter-disciplinary care team collaboration (see longitudinal plan of care) Comprehensive medication review performed; medication list updated in electronic medical record  Diabetes: Uncontrolled; current treatment: RYBELSUS 14MG  DAILY, JARDIANCE 25MG ,  GFR 66, A1C 9.5%-->11.8% (PATIENT HAS BEEN WITHOUT MEDICATION) SWITCH TO JARDIANCE-->PATIENT STATES FARXIGA ISN'T WORKING WILL TRIAL JARDIANCE SAMPLES BUT UNLIKELY THE CAUSE OF HIGHER BLOOD SUGARS CONTINUE RYBELSUS 14mg  daily Received notification from [place] regarding RE-ENROLLMENT approval for RYBELSUS. Patient assistance approved from 08/17/21 to 08/16/22.   FIRST SHIPMENT WILL GO OUT FIRST WEEK OF January 2023; Medication to ship to PCP office   Phone: [phone] Denies personal and family history of Medullary thyroid cancer (MTC) Discussed hyperlipidemia briefly LDL now at goal <70 Continue current regimen for now (SIMVA 40MG ) Would prefer rosuvastatin, but no complaints from patient at this time Dietary/lifestyle modifications recommended Current glucose readings: fasting glucose: <160, post prandial glucose: <200s Denies hypoglycemic/hyperglycemic symptoms Discussed meal  planning options and Plate method for healthy eating Avoid sugary drinks and desserts Incorporate balanced protein, non starchy veggies, 1 serving of carbohydrate with each meal Increase water intake Increase physical activity as able Current exercise: N/A Educated on [place]; PATIENT'S BLOOD SGUARS ARE >400 TODAY, DENIES ILLNESS AND N/V, INSTRUCTED PATIENT ON WHEN SHE SHOULD GO TO ER; OFFERED TO START INSULIN TODAY IN OFFICE BUT PATIENT DECLINED; ENCOURAGED WATER, LOW CARB DIET Assessed patient finances. NOVO [place] PATIENT ASSISTANCE PROGRAMS FOR 2022-23  Patient Goals/Self-Care Activities Over the next 90 days, patient will:  - take medications as prescribed collaborate with provider on medication access solutions  Follow Up Plan: Telephone follow up appointment with care management team member scheduled for: 1 MONTH   Please call the care guide team at [phone] if you need to cancel or reschedule your appointment.   The patient verbalized understanding of instructions, educational materials, and care plan provided today and declined offer to receive copy of patient instructions, educational materials, and care plan.   Regina Eck, PharmD, BCPS Clinical Pharmacist, [place]  II Phone [phone]

## 2021-08-01 ENCOUNTER — Telehealth: Payer: Self-pay | Admitting: Pharmacist

## 2021-08-01 NOTE — Telephone Encounter (Signed)
Call placed to patient to check on blood sugars No answer VM box full Patient did not pick up jardiance samples from yesterday

## 2021-08-06 ENCOUNTER — Other Ambulatory Visit: Payer: Self-pay | Admitting: Family Medicine

## 2021-08-07 ENCOUNTER — Telehealth: Payer: Self-pay | Admitting: Family Medicine

## 2021-08-07 ENCOUNTER — Ambulatory Visit: Payer: Medicare Other | Admitting: Pharmacist

## 2021-08-07 DIAGNOSIS — E1169 Type 2 diabetes mellitus with other specified complication: Secondary | ICD-10-CM

## 2021-08-07 DIAGNOSIS — F32A Depression, unspecified: Secondary | ICD-10-CM

## 2021-08-07 DIAGNOSIS — G629 Polyneuropathy, unspecified: Secondary | ICD-10-CM

## 2021-08-07 MED ORDER — GABAPENTIN 600 MG PO TABS: 1200.0000 mg | ORAL_TABLET | Freq: Three times a day (TID) | ORAL | 0 refills | Status: DC

## 2021-08-07 MED ORDER — AMLODIPINE BESYLATE 5 MG PO TABS
5.0000 mg | ORAL_TABLET | Freq: Every day | ORAL | 0 refills | Status: DC
Start: 2021-08-07 — End: 2021-09-10

## 2021-08-07 MED ORDER — OMEPRAZOLE 20 MG PO CPDR: 20.0000 mg | DELAYED_RELEASE_CAPSULE | Freq: Every day | ORAL | 0 refills | Status: DC

## 2021-08-07 MED ORDER — GLIPIZIDE ER 5 MG PO TB24: 5.0000 mg | ORAL_TABLET | Freq: Two times a day (BID) | ORAL | 2 refills | Status: DC

## 2021-08-07 MED ORDER — CITALOPRAM HYDROBROMIDE 40 MG PO TABS: ORAL_TABLET | ORAL | 0 refills | Status: DC

## 2021-08-07 MED ORDER — BUPROPION HCL ER (XL) 300 MG PO TB24: 300.0000 mg | ORAL_TABLET | Freq: Every day | ORAL | 0 refills | Status: DC

## 2021-08-07 NOTE — Telephone Encounter (Signed)
Pt aware refills sent to pharmacy 

## 2021-08-07 NOTE — Progress Notes (Signed)
Chronic Care Management Pharmacy Note  08/07/2021 Name:  Donna Rogers MRN:  [id number] DOB:  December 06, 1950  Summary: t2dm, hld  Recommendations/Changes made from today's visit:  Diabetes: Uncontrolled; current treatment: RYBELSUS 14MG DAILY, JARDIANCE 25MG, glipizide GFR 66, A1C 9.5%-->11.8% (PATIENT HAS BEEN WITHOUT MEDICATION) SWITCH TO JARDIANCE-->PATIENT STATES FARXIGA ISN'T WORKING WILL TRIAL JARDIANCE SAMPLES BUT UNLIKELY THE CAUSE OF HIGHER BLOOD SUGARS Application submitted to [facility] for patient assistance jardiance CONTINUE RYBELSUS 72m daily Re[place]ENROLLMENT approval for RYBELSUS. Patient assistance approved from 08/17/21 to 08/16/22.   FIRST SHIPMENT WILL GO OUT FIRST WEEK OF January 2023; Medication to ship to PCP office   Phone: [phone] personal and family history of Medullary thyroid cancer (MTC) Adding back glipizide to bring down sugars-->300 today in office Discussed hyperlipidemia briefly LDL now at goal <70 Continue current regimen for now (SIMVA 40MG) Would prefer rosuvastatin, but no complaints from patient at this time Dietary/lifestyle modifications recommended Current glucose readings: fasting glucose: not checking-meter broken, post prandial glucose: 300s  Free meter given today--contour next Bg 300 today in office Denies hypoglycemic/hyperglycemic symptoms Discussed meal planning options and Plate method for healthy eating Avoid sugary drinks and desserts Incorporate balanced protein, non starchy veggies, 1 serving of carbohydrate with each meal Increase water intake Increase physical activity as able Current exercise: N/A Educated on TRANSITION TO JARDIANCE; PATIENT'S BLOOD SGUARS ARE >400 TODAY, DENIES ILLNESS AND N/V, INSTRUCTED PATIENT ON WHEN SHE SHOULD GO TO ER; OFFERED TO START INSULIN TODAY IN OFFICE BUT PATIENT DECLINED; ENCOURAGED WATER, LOW CARB DIET Assessed patient  finances. NOVO NEitzenPATIENT ASSISTANCE PROGRAMS FOR 2022-23  Patient Goals/Self-Care Activities Over the next 90 days, patient will:  - take medications as prescribed collaborate with provider on medication access solutions  Follow Up Plan: Telephone follow up appointment with care management team member scheduled for: 1 MONTH  Subjective: Donna SMOLINSKIis an 70y.o. year old female who is a primary patient of GJanora Norlander DO.  The CCM team was consulted for assistance with disease management and care coordination needs.    Engaged with patient face to face for follow up visit in response to provider referral for pharmacy case management and/or care coordination services.   Consent to Services:  The patient was given information about Chronic Care Management services, agreed to services, and gave verbal consent prior to initiation of services.  Please see initial visit note for detailed documentation.   Patient Care Team: GJanora Norlander DO as PCP - General (Family Medicine) FShea Evans MNorva Riffle [name] as Social Worker (Licensed Clinical Social Worker) PBlanca Friend JRoyce Macadamia [facility]as Pharmacist (Family Medicine) GGatha Mayer MD as Consulting Physician (Gastroenterology) [facility] (Specialist)   Objective:  Lab Results  Component Value Date   CREATININE 0.78 04/04/2021   CREATININE 0.94 10/15/2020   CREATININE 0.98 09/08/2019    Lab Results  Component Value Date   HGBA1C 11.8 (H) 04/04/2021   Last diabetic Eye exam:  Lab Results  Component Value Date/Time   HMDIABEYEEXA No Retinopathy 04/04/2021 12:00 AM    Last diabetic Foot exam: No results found for: HMDIABFOOTEX      Component Value Date/Time   CHOL 123 10/15/2020 1134   TRIG 184 (H) 10/15/2020 1134   HDL 29 (L) 10/15/2020 1134   CHOLHDL 4.2 10/15/2020 1134   LCardwell63 10/15/2020 1134    Hepatic Function Latest Ref Rng & Units 10/15/2020 12/28/2018 08/03/2018  Total Protein  6.0 - 8.5 g/dL 6.9  7.3 6.8  Albumin 3.8 - 4.8 g/dL 4.0 4.3 4.1  AST 0 - 40 IU/L 16 33 34  ALT 0 - 32 IU/L 19 32 50(H)  Alk Phosphatase 44 - 121 IU/L 94 89 99  Total Bilirubin 0.0 - 1.2 mg/dL 0.3 0.4 0.2    Lab Results  Component Value Date/Time   TSH 1.790 07/03/2019 02:28 PM   TSH 0.706 08/03/2018 01:43 PM    CBC Latest Ref Rng & Units 10/15/2020 08/29/2019 08/03/2018  WBC 3.4 - 10.8 x10E3/uL 8.8 9.4 8.0  Hemoglobin 11.1 - 15.9 g/dL 14.7 14.2 14.3  Hematocrit 34.0 - 46.6 % 45.4 43.9 41.2  Platelets 150 - 450 x10E3/uL 269 237 242    No results found for: VD25OH  Clinical ASCVD: No  The ASCVD Risk score (Arnett DK, et al., 2019) failed to calculate for the following reasons:   The valid total cholesterol range is 130 to 320 mg/dL    Other: (CHADS2VASc if Afib, PHQ9 if depression, MMRC or CAT for COPD, ACT, DEXA)  Social History   Tobacco Use  Smoking Status Every Day   Packs/day: 1.00   Years: 46.00   Pack years: 46.00   Types: Cigarettes  Smokeless Tobacco Never   BP Readings from Last 3 Encounters:  04/04/21 (!) 164/111  04/03/21 (!) 150/84  12/20/20 133/82   Pulse Readings from Last 3 Encounters:  04/04/21 81  04/03/21 80  12/20/20 82   Wt Readings from Last 3 Encounters:  04/15/21 188 lb (85.3 kg)  04/04/21 188 lb 12.8 oz (85.6 kg)  04/03/21 188 lb (85.3 kg)    Assessment: Review of patient past medical history, allergies, medications, health status, including review of consultants reports, laboratory and other test data, was performed as part of comprehensive evaluation and provision of chronic care management services.   SDOH:  (Social Determinants of Health) assessments and interventions performed:    CCM Care Plan  Allergies  Allergen Reactions   Ciprofloxacin Hcl Rash   Enbrel [Etanercept] Rash   Metformin And Related Diarrhea    Medications Reviewed Today     Reviewed by Lavera Guise, [facility] (Pharmacist) on 08/07/21 at 1519  Med List Status: <None>    Medication Order Taking? Sig Documenting Provider Last Dose Status Informant  Alpha-Lipoic Acid 600 MG CAPS [id number] No Take 1 capsule (600 mg total) by mouth daily. For neuropathy [name], DO Taking Active   amLODipine (NORVASC) 5 MG tablet [id number]  TAKE ONE TABLET BY MOUTH EVERY DAY Ronnie Doss M, DO  Active   Ascorbic Acid (VITAMIN C) 500 MG CHEW [id number] No 1 tablet [provider] Taking Active   aspirin 81 MG tablet [id number] No Take 81 mg by mouth daily. [provider] Taking Active Self  atenolol (TENORMIN) 25 MG tablet [id number]  TAKE 1 TABLET BY MOUTH DAILY. Ronnie Doss M, DO  Active   azelastine (OPTIVAR) 0.05 % ophthalmic solution [id number] No in the morning and at bedtime. [provider] Taking Active   budesonide (ENTOCORT EC) 3 MG 24 hr capsule [id number] No Take 3 capsules (9 mg total) by mouth daily. [name], [name], PA-C Taking Active   buPROPion (WELLBUTRIN XL) 300 MG 24 hr tablet [id number]  Take 1 tablet (300 mg total) by mouth daily. (NEEDS TO BE SEEN BEFORE NEXT REFILL) Ronnie Doss M, DO  Active   busPIRone (BUSPAR) 10 MG tablet [id number]  TAKE (1) TABLET TWICE A DAY. Lajuana Ripple,  Ashly M, DO  Active   cholecalciferol (VITAMIN D) 1000 UNITS tablet [id number] No Take 1,000 Units by mouth daily. [provider] Taking Active Self  citalopram (CELEXA) 40 MG tablet [id number] No TAKE 1 TABLET EVERY DAY (Needs to be seen) [name], DO Taking Active   Cyanocobalamin (VITAMIN B 12 PO) [id number] No Take by mouth. [provider] Taking Active   diclofenac (VOLTAREN) 75 MG EC tablet [id number]  TAKE ONE TABLET BY MOUTH TWICE DAILY AS NEEDED FOR MODERATE PAIN (USE SPARINGLY) Ronnie Doss M, DO  Active   diphenoxylate-atropine (LOMOTIL) 2.5-0.025 MG tablet [id number] No TAKE 1 OR 2 TABLETS FOUR TIMES DAILY AS NEEDED Gatha Mayer, MD Taking Active   empagliflozin (JARDIANCE) 25 MG TABS tablet  [id number]  Take 25 mg by mouth daily. [provider]  Active            Med Note Parthenia Ames Jul 31, 2021 54:98 PM) SAMPLES  folic acid (FOLVITE) 1 MG tablet [id number] No Take 1 mg by mouth daily. [provider] Taking Active   gabapentin (NEURONTIN) 600 MG tablet [id number]  Take 2 tablets (1,200 mg total) by mouth 3 (three) times daily. (NEEDS TO BE SEEN BEFORE NEXT REFILL) Ronnie Doss M, DO  Active   HYDROcodone-acetaminophen (NORCO/VICODIN) 5-325 MG per tablet [id number] No Take 1 tablet by mouth every 6 (six) hours as needed. Lysbeth Penner, FNP Taking Active Self  losartan (COZAAR) 100 MG tablet [id number]  TAKE 1 TABLET DAILY Ronnie Doss M, DO  Active   methotrexate (RHEUMATREX) 2.5 MG tablet [id number] No Take 15 mg by mouth once a week. [provider] Taking Active   omeprazole (PRILOSEC) 20 MG capsule [id number]  Take 1 capsule (20 mg total) by mouth daily. Loman Brooklyn, FNP  Active   [facility] Calcium 500 MG TABS [id number] No 1 tablet with meals [provider] Taking Active   Pyridoxine HCl (VITAMIN B-6 PO) [id number] No Take by mouth. [provider] Taking Active   Semaglutide (RYBELSUS) 14 MG TABS [id number] No Take 14 mg by mouth daily. [name], DO Taking Active            Med Note Leisa Lenz May 14, 2021  1:57 PM) VIA NOVO NORDISK PATIENT ASSISTANCE PROGRAM  simvastatin (ZOCOR) 40 MG tablet [id number]  TAKE 1 TABLET DAILY [name], DO  Active   SULFASALAZINE PO [id number] No Take by mouth. [provider] Taking Active            Med Note Georgiann Cocker, AMY E   Tue Apr 15, 2021  4:00 PM) 500 mg BID  zolpidem (AMBIEN) 5 MG tablet [id number] No Take 1 tablet at bedtime as needed for sleep (use sparingly). [name], DO Taking Active             Patient Active Problem List   Diagnosis Date Noted   Degeneration of lumbar intervertebral disc 04/15/2021    Osteopenia 04/15/2021   Primary osteoarthritis 04/15/2021   Carpal tunnel syndrome of right wrist 07/17/2020   Dysphagia 03/27/2020   Generalized anxiety disorder with panic attacks 09/01/2019   Hyperlipidemia associated with type 2 diabetes mellitus ([place]) 12/28/2018   Chronic pain of right knee 12/28/2018   Tobacco abuse 04/10/2016   Lymphocytic colitis 12/03/2015   DM2 (diabetes mellitus, type 2) ([place]) 10/11/2015   Depressive disorder 06/17/2015   Fatigue 06/17/2015   Weakness 06/03/2015  Diarrhea 05/13/2015   Hypertension associated with diabetes ([place])    OBESITY 01/21/2009   EROSIVE ESOPHAGITIS 01/21/2009   GASTROESOPHAGEAL REFLUX DISEASE, CHRONIC 01/21/2009   Rheumatoid arthritis ([place]) 01/21/2009   TENDINITIS 01/21/2009   EDEMA 01/21/2009    Immunization History  Administered Date(s) Administered   Fluad Quad(high Dose 65+) 05/03/2019   Influenza,inj,Quad PF,6+ Mos 05/25/2013, 07/31/2015, 06/18/2016, 05/16/2018   Influenza-Unspecified 05/07/2019, 07/24/2020, 05/08/2021   PPD Test 05/08/2013   Pneumococcal Conjugate-13 05/16/2018   Pneumococcal Polysaccharide-23 07/03/2019   Tdap 12/28/2018   Zoster Recombinat (Shingrix) 06/06/2019, 06/30/2019, 10/27/2019    Conditions to be addressed/monitored: HLD and DMII  Care Plan : PHARMD MEDICATION MANAGEMENT  Updates made by Lavera Guise, [place] since 08/07/2021 12:00 AM     Problem: DISEASE PROGRESSION PREVENTION      Long-Range Goal: T2DM, HLD   Recent Progress: Not on track  Priority: High  Note:   Current Barriers:  Unable to independently afford treatment regimen Unable to achieve control of T2DM   Pharmacist Clinical Goal(s):  Over the next 90 days, patient will verbalize ability to afford treatment regimen achieve control of T2DM as evidenced by IMPROVED GLYCEMIC CONTROL, GOAL A1C<7%  through collaboration with PharmD and provider.    Interventions: 1:1 collaboration with [name], DO regarding  development and update of comprehensive plan of care as evidenced by provider attestation and co-signature Inter-disciplinary care team collaboration (see longitudinal plan of care) Comprehensive medication review performed; medication list updated in electronic medical record  Diabetes: Uncontrolled; current treatment: RYBELSUS 14MG DAILY, JARDIANCE 25MG, glipizide GFR 66, A1C 9.5%-->11.8% (PATIENT HAS BEEN WITHOUT MEDICATION) SWITCH TO JARDIANCE-->PATIENT STATES FARXIGA ISN'T WORKING WILL TRIAL JARDIANCE SAMPLES BUT UNLIKELY THE CAUSE OF HIGHER BLOOD SUGARS Application submitted to [employer] [facility] for patient assistance jardiance CONTINUE RYBELSUS 14m daily Received notification from [place] RE-ENROLLMENT approval for RYBELSUS. Patient assistance approved from 08/17/21 to 08/16/22.   FIRST SHIPMENT WILL GO OUT FIRST WEEK OF January 2023; Medication to ship to PCP office   Phone: [phone] personal and family history of Medullary thyroid cancer (MTC) Adding back glipizide to bring down sugars-->300 today in office Discussed hyperlipidemia briefly LDL now at goal <70 Continue current regimen for now (SIMVA 40MG) Would prefer rosuvastatin, but no complaints from patient at this time Dietary/lifestyle modifications recommended Current glucose readings: fasting glucose: not checking-meter broken, post prandial glucose: 300s  Free meter given today--contour next Bg 300 today in office Denies hypoglycemic/hyperglycemic symptoms Discussed meal planning options and Plate method for healthy eating Avoid sugary drinks and desserts Incorporate balanced protein, non starchy veggies, 1 serving of carbohydrate with each meal Increase water intake Increase physical activity as able Current exercise: N/A Educated on TRANSITION TO JARDIANCE; PATIENT'S BLOOD SGUARS ARE >400 TODAY, DENIES ILLNESS AND N/V, INSTRUCTED PATIENT ON WHEN SHE SHOULD GO TO ER; OFFERED TO START INSULIN TODAY  IN OFFICE BUT PATIENT DECLINED; ENCOURAGED WATER, LOW CARB DIET Assessed patient finances. NOVO NReinbeckPATIENT ASSISTANCE PROGRAMS FOR 2022-23  Patient Goals/Self-Care Activities Over the next 90 days, patient will:  - take medications as prescribed collaborate with provider on medication access solutions  Follow Up Plan: Telephone follow up appointment with care management team member scheduled for: 1 MONTH      Medication Assistance:  rybelsus via novo nordisk; [facility]  [facility]:  [place] [id number]  Phone: [phone] Fax: [place], [place]. [place] [address] Phone: [phone] Fax: [phone]   Follow Up:  Patient agrees to Care Plan and Follow-up.  Plan: Telephone follow up appointment with care management team member scheduled for:  3 weeks   Regina Eck, PharmD, BCPS Clinical Pharmacist, [place]  II Phone [phone]

## 2021-08-07 NOTE — Telephone Encounter (Signed)
°  Prescription Request  08/07/2021  What is the name of the medication or equipment? AMLODIPINE, BUPROPION, CELEXA, GABAPENTIN  Have you contacted your pharmacy to request a refill? YES  Which pharmacy would you like this sent to? [facility]  Pt is scheduled to see Dr Lajuana Ripple on 09/16/2021 but needs refills on these meds called in to last her until her appt.

## 2021-08-07 NOTE — Addendum Note (Signed)
Addended by: Antonietta Barcelona D on: 08/07/2021 01:21 PM   Modules accepted: Orders

## 2021-08-07 NOTE — Addendum Note (Signed)
Addended by: Antonietta Barcelona D on: 08/07/2021 03:45 PM   Modules accepted: Orders

## 2021-08-07 NOTE — Telephone Encounter (Signed)
Refill failed again, resent

## 2021-08-07 NOTE — Patient Instructions (Signed)
Visit Information  Thank you for taking time to visit with me today. Please don't hesitate to contact me if I can be of assistance to you before our next scheduled telephone appointment.  Following are the goals we discussed today:  Current Barriers:  Unable to independently afford treatment regimen Unable to achieve control of T2DM   Pharmacist Clinical Goal(s):  Over the next 90 days, patient will verbalize ability to afford treatment regimen achieve control of T2DM as evidenced by IMPROVED GLYCEMIC CONTROL, GOAL A1C<7% through collaboration with PharmD and provider.    Interventions: 1:1 collaboration with [name], DO regarding development and update of comprehensive plan of care as evidenced by provider attestation and co-signature Inter-disciplinary care team collaboration (see longitudinal plan of care) Comprehensive medication review performed; medication list updated in electronic medical record  Diabetes: Uncontrolled; current treatment: RYBELSUS 14MG  DAILY, JARDIANCE 25MG , glipizide GFR 66, A1C 9.5%-->11.8% (PATIENT HAS BEEN WITHOUT MEDICATION) SWITCH TO JARDIANCE-->PATIENT STATES FARXIGA ISN'T WORKING WILL TRIAL JARDIANCE SAMPLES BUT UNLIKELY THE CAUSE OF HIGHER BLOOD SUGARS Application submitted to [facility] for patient assistance jardiance CONTINUE RYBELSUS 14mg  daily Received notification from [place] regarding RE-ENROLLMENT approval for RYBELSUS. Patient assistance approved from 08/17/21 to 08/16/22.   FIRST SHIPMENT WILL GO OUT FIRST WEEK OF January 2023; Medication to ship to PCP office   Phone: [phone] Denies personal and family history of Medullary thyroid cancer (MTC) Adding back glipizide to bring down sugars-->300 today in office Discussed hyperlipidemia briefly LDL now at goal <70 Continue current regimen for now (SIMVA 40MG ) Would prefer rosuvastatin, but no complaints from patient at this time Dietary/lifestyle modifications  recommended Current glucose readings: fasting glucose: not checking-meter broken, post prandial glucose: 300s  Free meter given today--contour next Bg 300 today in office Denies hypoglycemic/hyperglycemic symptoms Discussed meal planning options and Plate method for healthy eating Avoid sugary drinks and desserts Incorporate balanced protein, non starchy veggies, 1 serving of carbohydrate with each meal Increase water intake Increase physical activity as able Current exercise: N/A Educated on TRANSITION TO JARDIANCE; PATIENT'S BLOOD SGUARS ARE >400 TODAY, DENIES ILLNESS AND N/V, INSTRUCTED PATIENT ON WHEN SHE SHOULD GO TO ER; OFFERED TO START INSULIN TODAY IN OFFICE BUT PATIENT DECLINED; ENCOURAGED WATER, LOW CARB DIET Assessed patient finances. NOVO [place] PATIENT ASSISTANCE PROGRAMS FOR 2022-23  Patient Goals/Self-Care Activities Over the next 90 days, patient will:  - take medications as prescribed collaborate with provider on medication access solutions  Follow Up Plan: Telephone follow up appointment with care management team member scheduled for: 1 MONTH   Please call the care guide team at [phone] if you need to cancel or reschedule your appointment.   If you are experiencing a Mental Health or [place] or need someone to talk to, please call the Suicide and Crisis Lifeline: 988 call the [place] National Suicide Prevention Lifeline: 7727660668 or TTY: (937) 672-0274 TTY (571)824-6850) to talk to a trained counselor   The patient verbalized understanding of instructions, educational materials, and care plan provided today and declined offer to receive copy of patient instructions, educational materials, and care plan.   Signature Regina Eck, PharmD, BCPS Clinical Pharmacist, [place]  II Phone [phone]

## 2021-08-07 NOTE — Telephone Encounter (Signed)
Refill failed. resent °

## 2021-08-13 ENCOUNTER — Ambulatory Visit (INDEPENDENT_AMBULATORY_CARE_PROVIDER_SITE_OTHER): Payer: Medicare Other

## 2021-08-13 ENCOUNTER — Encounter: Payer: Self-pay | Admitting: Family Medicine

## 2021-08-13 ENCOUNTER — Other Ambulatory Visit: Payer: Self-pay

## 2021-08-13 ENCOUNTER — Ambulatory Visit (INDEPENDENT_AMBULATORY_CARE_PROVIDER_SITE_OTHER): Payer: Medicare Other | Admitting: Family Medicine

## 2021-08-13 VITALS — BP 138/74 | HR 78 | Temp 97.7°F | Ht 60.0 in | Wt 184.1 lb

## 2021-08-13 DIAGNOSIS — M25511 Pain in right shoulder: Secondary | ICD-10-CM

## 2021-08-13 DIAGNOSIS — G8929 Other chronic pain: Secondary | ICD-10-CM

## 2021-08-13 DIAGNOSIS — M25411 Effusion, right shoulder: Secondary | ICD-10-CM | POA: Diagnosis not present

## 2021-08-13 MED ORDER — METHYLPREDNISOLONE ACETATE 40 MG/ML IJ SUSP
60.0000 mg | Freq: Once | INTRAMUSCULAR | Status: AC
Start: 2021-08-13 — End: 2021-08-13
  Administered 2021-08-13: 13:00:00 60 mg via INTRAMUSCULAR

## 2021-08-13 NOTE — Progress Notes (Signed)
Subjective:  Patient ID: Donna Rogers, female    DOB: [birth year], 70 y.o.   MRN: [id number]  Patient Care Team: [name], DO as PCP - General (Family Medicine) Shea Evans Norva Riffle, [name] as Social Worker (Licensed Clinical Social Worker) Blanca Friend, Royce Macadamia, [facility] as Pharmacist (Family Medicine) Gatha Mayer, MD as Consulting Physician (Gastroenterology) [facility] (Specialist)   Chief Complaint:  Shoulder Pain   HPI: Donna Rogers is a 70 y.o. female presenting on 08/13/2021 for Shoulder Pain   Patient presents today for evaluation of right shoulder pain.  She had a fall about 3 months ago and has continued right shoulder pain since this time.  She has decreased range of motion and noted swelling to posterior shoulder.  She denies any numbness or tingling.  No loss of function.  Shoulder Pain  The pain is present in the right shoulder. The current episode started more than 1 month ago. There has been a history of trauma. The problem occurs constantly. The problem has been waxing and waning. The pain is at a severity of 4/10. The pain is mild. Associated symptoms include joint locking, joint swelling, a limited range of motion and stiffness. Pertinent negatives include no fever, inability to bear weight, itching, numbness or tingling. The symptoms are aggravated by activity, contact and lying down. She has tried acetaminophen for the symptoms. The treatment provided no relief.    Relevant past medical, surgical, family, and social history reviewed and updated as indicated.  Allergies and medications reviewed and updated. Data reviewed: Chart in Epic.   Past Medical History:  Diagnosis Date   Allergy    seasonal allergies   Anemia    hx of   Arthritis    rheumatoid   Depression    on meds   Diabetes ([place])    Diabetic peripheral neuropathy ([facility])    GERD (gastroesophageal reflux disease)    on meds   Headache    Hyperlipidemia    on meds   Hyperplastic  rectal polyp 10/21/2015   Hypertension    pt. denies   Lymphocytic colitis    Neuropathy    Rheumatoid arthritis ([place])    Shortness of breath dyspnea    with exertion    Past Surgical History:  Procedure Laterality Date   BREAST LUMPECTOMY Right 2018   BREAST LUMPECTOMY WITH RADIOACTIVE SEED LOCALIZATION Left 03/03/2016   Procedure: BREAST LUMPECTOMY WITH RADIOACTIVE SEED LOCALIZATION;  Surgeon: Coralie Keens, MD;  Location: [place];  Service: General;  Laterality: Left;   COLONOSCOPY  08/2002   RMR: internal hemorrhoids   COLONOSCOPY N/A 10/21/2015   Procedure: COLONOSCOPY;  Surgeon: Daneil Dolin, MD;  [facility];  Service: Endoscopy;  Laterality: N/A;  250 - moved to 2:35 - office to notify pt   CYST REMOVAL TRUNK     ESOPHAGOGASTRODUODENOSCOPY  08/2002   RMR: nonerosive reflux esophagitis, hiatal hernia   TONSILLECTOMY     WISDOM TOOTH EXTRACTION      Social History   Socioeconomic History   Marital status: Significant Other    Spouse name: Not on file   Number of children: 1   Years of education: 11th   Highest education level: 11th grade  Occupational History   Occupation: retired  Tobacco Use   Smoking status: Every Day    Packs/day: 1.00    Years: 46.00    Pack years: 46.00    Types: Cigarettes   Smokeless tobacco: Never  Vaping Use   Vaping Use: Some days  Substance and Sexual Activity   Alcohol use: No    Alcohol/week: 0.0 standard drinks   Drug use: No   Sexual activity: Not Currently  Other Topics Concern   Not on file  Social History Narrative   The patient is divorced she has 1 son who is married that she has a grandchild.   She does not use alcohol or drugs she drinks 3-4 caffeinated beverages daily   She is a smoker   She lives with her boyfriend   Social Determinants of Radio broadcast assistant Strain: Medium Risk   Difficulty of Paying Living Expenses: Somewhat hard  Food Insecurity: No Food Insecurity   Worried About  Charity fundraiser in the Last Year: Never true   Ran Out of Food in the Last Year: Never true  Transportation Needs: No Transportation Needs   Lack of Transportation (Medical): No   Lack of Transportation (Non-Medical): No  Physical Activity: Inactive   Days of Exercise per Week: 0 days   Minutes of Exercise per Session: 0 min  Stress: Stress Concern Present   Feeling of Stress : To some extent  Social Connections: Moderately Isolated   Frequency of Communication with Friends and Family: More than three times a week   Frequency of Social Gatherings with Friends and Family: More than three times a week   Attends Religious Services: Never   Marine scientist or Organizations: No   Attends Archivist Meetings: Never   Marital Status: Living with partner  Intimate Partner Violence: Not At Risk   Fear of Current or Ex-Partner: No   Emotionally Abused: No   Physically Abused: No   Sexually Abused: No    Outpatient Encounter Medications as of 08/13/2021  Medication Sig   Alpha-Lipoic Acid 600 MG CAPS Take 1 capsule (600 mg total) by mouth daily. For neuropathy   amLODipine (NORVASC) 5 MG tablet Take 1 tablet (5 mg total) by mouth daily.   Ascorbic Acid (VITAMIN C) 500 MG CHEW 1 tablet   aspirin 81 MG tablet Take 81 mg by mouth daily.   atenolol (TENORMIN) 25 MG tablet TAKE 1 TABLET BY MOUTH DAILY.   azelastine (OPTIVAR) 0.05 % ophthalmic solution in the morning and at bedtime.   budesonide (ENTOCORT EC) 3 MG 24 hr capsule Take 3 capsules (9 mg total) by mouth daily.   buPROPion (WELLBUTRIN XL) 300 MG 24 hr tablet Take 1 tablet (300 mg total) by mouth daily.   busPIRone (BUSPAR) 10 MG tablet TAKE (1) TABLET TWICE A DAY.   cholecalciferol (VITAMIN D) 1000 UNITS tablet Take 1,000 Units by mouth daily.   citalopram (CELEXA) 40 MG tablet TAKE 1 TABLET EVERY DAY   Cyanocobalamin (VITAMIN B 12 PO) Take by mouth.   diclofenac (VOLTAREN) 75 MG EC tablet TAKE ONE TABLET BY MOUTH  TWICE DAILY AS NEEDED FOR MODERATE PAIN (USE SPARINGLY)   diphenoxylate-atropine (LOMOTIL) 2.5-0.025 MG tablet TAKE 1 OR 2 TABLETS FOUR TIMES DAILY AS NEEDED   empagliflozin (JARDIANCE) 25 MG TABS tablet Take 25 mg by mouth daily.   folic acid (FOLVITE) 1 MG tablet Take 1 mg by mouth daily.   gabapentin (NEURONTIN) 600 MG tablet Take 2 tablets (1,200 mg total) by mouth 3 (three) times daily.   glipiZIDE (GLUCOTROL XL) 5 MG 24 hr tablet Take 1 tablet (5 mg total) by mouth 2 (two) times daily with a meal.   HYDROcodone-acetaminophen (  NORCO/VICODIN) 5-325 MG per tablet Take 1 tablet by mouth every 6 (six) hours as needed.   losartan (COZAAR) 100 MG tablet TAKE 1 TABLET DAILY   methotrexate (RHEUMATREX) 2.5 MG tablet Take 15 mg by mouth once a week.   omeprazole (PRILOSEC) 20 MG capsule Take 1 capsule (20 mg total) by mouth daily.   Oyster Shell Calcium 500 MG TABS 1 tablet with meals   Pyridoxine HCl (VITAMIN B-6 PO) Take by mouth.   Semaglutide (RYBELSUS) 14 MG TABS Take 14 mg by mouth daily.   simvastatin (ZOCOR) 40 MG tablet TAKE 1 TABLET DAILY   SULFASALAZINE PO Take by mouth.   zolpidem (AMBIEN) 5 MG tablet Take 1 tablet at bedtime as needed for sleep (use sparingly).   [EXPIRED] methylPREDNISolone acetate (DEPO-MEDROL) injection 60 mg    No facility-administered encounter medications on file as of 08/13/2021.    Allergies  Allergen Reactions   Ciprofloxacin Hcl Rash   Enbrel [Etanercept] Rash   Metformin And Related Diarrhea    Review of Systems  Constitutional:  Negative for activity change, appetite change, chills, diaphoresis, fatigue, fever and unexpected weight change.  Musculoskeletal:  Positive for arthralgias, joint swelling, myalgias and stiffness. Negative for back pain, gait problem, neck pain and neck stiffness.  Skin:  Negative for itching.  Neurological:  Negative for dizziness, tingling, tremors, seizures, syncope, facial asymmetry, speech difficulty, weakness,  light-headedness, numbness and headaches.  All other systems reviewed and are negative.      Objective:  BP 138/74    Pulse 78    Temp 97.7 F (36.5 C) (Temporal)    Ht 5' (1.524 m)    Wt 184 lb 2 oz (83.5 kg)    BMI 35.96 kg/m    Wt Readings from Last 3 Encounters:  08/13/21 184 lb 2 oz (83.5 kg)  04/15/21 188 lb (85.3 kg)  04/04/21 188 lb 12.8 oz (85.6 kg)    Physical Exam Vitals and nursing note reviewed.  Constitutional:      General: She is not in acute distress.    Appearance: Normal appearance. She is well-developed and well-groomed. She is obese. She is not ill-appearing, toxic-appearing or diaphoretic.  HENT:     Head: Normocephalic and atraumatic.     Jaw: There is normal jaw occlusion.     Right Ear: Hearing normal.     Left Ear: Hearing normal.     Nose: Nose normal.     Mouth/Throat:     Lips: Pink.     Mouth: Mucous membranes are moist.     Pharynx: Oropharynx is clear. Uvula midline.  Eyes:     General: Lids are normal.     Extraocular Movements: Extraocular movements intact.     Conjunctiva/sclera: Conjunctivae normal.     Pupils: Pupils are equal, round, and reactive to light.  Neck:     Thyroid: No thyroid mass, thyromegaly or thyroid tenderness.     Vascular: No carotid bruit or JVD.     Trachea: Trachea and phonation normal.  Cardiovascular:     Rate and Rhythm: Normal rate and regular rhythm.     Chest Wall: PMI is not displaced.     Pulses: Normal pulses.     Heart sounds: Normal heart sounds. No murmur heard.   No friction rub. No gallop.  Pulmonary:     Effort: Pulmonary effort is normal. No respiratory distress.     Breath sounds: Normal breath sounds. No wheezing.  Abdominal:  General: Bowel sounds are normal. There is no abdominal bruit.     Palpations: Abdomen is soft. There is no hepatomegaly or splenomegaly.  Musculoskeletal:     Right shoulder: Swelling, tenderness and crepitus present. No deformity, effusion, laceration or bony  tenderness. Decreased range of motion. Normal strength. Normal pulse.     Right upper arm: Normal.     Left upper arm: Normal.       Arms:     Cervical back: Normal, normal range of motion and neck supple.     Right lower leg: No edema.     Left lower leg: No edema.  Lymphadenopathy:     Cervical: No cervical adenopathy.  Skin:    General: Skin is warm and dry.     Capillary Refill: Capillary refill takes less than 2 seconds.     Coloration: Skin is not cyanotic, jaundiced or pale.     Findings: No rash.  Neurological:     General: No focal deficit present.     Mental Status: She is alert and oriented to person, place, and time.     Sensory: Sensation is intact.     Motor: Motor function is intact.     Coordination: Coordination is intact.     Gait: Gait is intact.     Deep Tendon Reflexes: Reflexes are normal and symmetric.  Psychiatric:        Attention and Perception: Attention and perception normal.        Mood and Affect: Mood and affect normal.        Speech: Speech normal.        Behavior: Behavior normal. Behavior is cooperative.        Thought Content: Thought content normal.        Cognition and Memory: Cognition and memory normal.        Judgment: Judgment normal.    Results for orders placed or performed in visit on 04/23/21  HM DIABETES EYE EXAM  Result Value Ref Range   HM Diabetic Eye Exam No Retinopathy No Retinopathy     X-Ray: right shoulder: No acute findings. Preliminary x-ray reading by Monia Pouch, FNP-C, [facility].   Pertinent labs & imaging results that were available during my care of the patient were reviewed by me and considered in my medical decision making.  Assessment & Plan:  Ivalee was seen today for shoulder pain.  Diagnoses and all orders for this visit:  Chronic right shoulder pain Pain and swelling of right shoulder Imaging in office without acute findings, will notify patient if radiology reading differs.  Patient has localized  swelling to the posterior shoulder concerning for muscular tear, will order MRI.  Patient placed in sling for comfort measures.  Referral to PT placed.  Symptomatic care at home discussed in detail.  Burst with steroids in office.  Once MRI is completed, will refer to Ortho if warranted.  Follow-up in office in 6 weeks or sooner if needed. -     Ambulatory referral to Physical Therapy -     MR Shoulder Right Wo Contrast; Future -     methylPREDNISolone acetate (DEPO-MEDROL) injection 60 mg     Continue all other maintenance medications.  Follow up plan: Return in 6 weeks (on 09/24/2021), or if symptoms worsen or fail to improve, for shoulder.   Continue healthy lifestyle choices, including diet (rich in fruits, vegetables, and lean proteins, and low in salt and simple carbohydrates) and exercise (at least 30 minutes of  moderate physical activity daily).  Educational handout given for shoulder pain  The above assessment and management plan was discussed with the patient. The patient verbalized understanding of and has agreed to the management plan. Patient is aware to call the clinic if they develop any new symptoms or if symptoms persist or worsen. Patient is aware when to return to the clinic for a follow-up visit. Patient educated on when it is appropriate to go to the emergency department.   Monia Pouch, FNP-C [facility] [phone]

## 2021-08-16 DIAGNOSIS — E1169 Type 2 diabetes mellitus with other specified complication: Secondary | ICD-10-CM | POA: Diagnosis not present

## 2021-08-16 DIAGNOSIS — E785 Hyperlipidemia, unspecified: Secondary | ICD-10-CM

## 2021-08-19 ENCOUNTER — Ambulatory Visit: Payer: Medicare Other

## 2021-08-22 ENCOUNTER — Ambulatory Visit (HOSPITAL_COMMUNITY)
Admission: RE | Admit: 2021-08-22 | Discharge: 2021-08-22 | Disposition: A | Discharge status: Home / Self Care | Payer: Medicare Other | Source: Ambulatory Visit | Attending: Family Medicine | Admitting: Family Medicine

## 2021-08-22 ENCOUNTER — Ambulatory Visit (INDEPENDENT_AMBULATORY_CARE_PROVIDER_SITE_OTHER): Payer: Medicare Other | Admitting: Pharmacist

## 2021-08-22 ENCOUNTER — Other Ambulatory Visit: Payer: Self-pay

## 2021-08-22 VITALS — BP 120/80

## 2021-08-22 DIAGNOSIS — E1169 Type 2 diabetes mellitus with other specified complication: Secondary | ICD-10-CM

## 2021-08-22 DIAGNOSIS — M25411 Effusion, right shoulder: Secondary | ICD-10-CM | POA: Insufficient documentation

## 2021-08-22 DIAGNOSIS — M25511 Pain in right shoulder: Secondary | ICD-10-CM | POA: Diagnosis not present

## 2021-08-22 DIAGNOSIS — G8929 Other chronic pain: Secondary | ICD-10-CM | POA: Insufficient documentation

## 2021-08-22 NOTE — Progress Notes (Signed)
Chronic Care Management Pharmacy Note  08/22/2021 Name:  Donna Rogers MRN:  [id number] DOB:  04/30/1951  Summary: T2DM, HLD  Recommendations/Changes made from today's visit: Diabetes: Uncontrolled; current treatment:  RYBELSUS 14MG DAILY, JARDIANCE 25MG, glipizide GFR 66, A1C 9.5%-->11.8% (PATIENT HAS BEEN WITHOUT MEDICATION) CONTINUE JARDIANCE 25MG-->PATIENT STATES FARXIGA "DIDN'T WORK FOR HER" Tolerating well Application submitted to [employer] for patient assistance jardiance--PATIENT APPROVED FOR 2023--WILL BE SENT TO HER HOME CONTINUE RYBELSUS 63m daily [place] for RYBELSUS. Patient assistance approved from 08/17/21 to 08/16/22.   FIRST SHIPMENT WILL GO OUT FIRST WEEK OF January 2023; Medication to ship to PCP office   Phone: [phone] personal and family history of Medullary thyroid cancer (MTC) CONTINUE GLIPIZIDE FOR NOW-->coming down in the 200s Discussed hyperlipidemia briefly LDL now at goal <70 Continue current regimen for now (SIMVA 40MG) Would prefer rosuvastatin, but no complaints from patient at this time Dietary/lifestyle modifications recommended Current glucose readings: fasting glucose: 200s, post prandial glucose: 250-300s  Free meter given today--contour next REPORTS hyperglycemic symptoms Discussed meal planning options and Plate method for healthy eating Avoid sugary drinks and desserts Incorporate balanced protein, non starchy veggies, 1 serving of carbohydrate with each meal Increase water intake Increase physical activity as able Current exercise: N/A Educated on TRANSITION TO JARDIANCE; PATIENT'S BLOOD SGUARS ARE >400 TODAY, DENIES ILLNESS AND N/V, INSTRUCTED PATIENT ON WHEN SHE SHOULD GO TO ER; OFFERED TO START INSULIN TODAY IN OFFICE BUT PATIENT DECLINED; ENCOURAGED WATER, LOW CARB DIET Assessed patient finances. [place] FOR  2022-23  Patient Goals/Self-Care Activities Over the next 90 days, patient will:  - take medications as prescribed collaborate with provider on medication access solutions  Follow Up Plan: Telephone follow up appointment with care management team member scheduled for: 1 MONTH  Subjective: Donna MONTERis an 71y.o. year old female who is a primary patient of GJanora Norlander DO.  The CCM team was consulted for assistance with disease management and care coordination needs.    Engaged with patient by telephone for follow up visit in response to provider referral for pharmacy case management and/or care coordination services.   Consent to Services:  The patient was given information about Chronic Care Management services, agreed to services, and gave verbal consent prior to initiation of services.  Please see initial visit note for detailed documentation.   Patient Care Team: GJanora Norlander DO as PCP - General (Family Medicine) FShea Evans MNorva Riffle [name] as Social Worker (Licensed Clinical Social Worker) PBlanca Friend JRoyce Macadamia RB[facility]oscopyas Pharmacist (Family Medicine) GGatha Mayer MD as Consulting Physician (Gastroenterology) [facility] (Specialist)  Objective:  Lab Results  Component Value Date   CREATININE 0.78 04/04/2021   CREATININE 0.94 10/15/2020   CREATININE 0.98 09/08/2019    Lab Results  Component Value Date   HGBA1C 11.8 (H) 04/04/2021   Last diabetic Eye exam:  Lab Results  Component Value Date/Time   HMDIABEYEEXA No Retinopathy 04/04/2021 12:00 AM    Last diabetic Foot exam: No results found for: HMDIABFOOTEX      Component Value Date/Time   CHOL 123 10/15/2020 1134   TRIG 184 (H) 10/15/2020 1134   HDL 29 (L) 10/15/2020 1134   CHOLHDL 4.2 10/15/2020 1134   LDLCALC 63 10/15/2020 1134    Hepatic Function Latest Ref Rng & Units 10/15/2020 12/28/2018 08/03/2018  Total Protein 6.0 - 8.5 g/dL 6.9 7.3 6.8  Albumin 3.8 -  4.8 g/dL 4.0 4.3 4.1  AST 0 - 40  IU/L 16 33 34  ALT 0 - 32 IU/L 19 32 50(H)  Alk Phosphatase 44 - 121 IU/L 94 89 99  Total Bilirubin 0.0 - 1.2 mg/dL 0.3 0.4 0.2    Lab Results  Component Value Date/Time   TSH 1.790 07/03/2019 02:28 PM   TSH 0.706 08/03/2018 01:43 PM    CBC Latest Ref Rng & Units 10/15/2020 08/29/2019 08/03/2018  WBC 3.4 - 10.8 x10E3/uL 8.8 9.4 8.0  Hemoglobin 11.1 - 15.9 g/dL 14.7 14.2 14.3  Hematocrit 34.0 - 46.6 % 45.4 43.9 41.2  Platelets 150 - 450 x10E3/uL 269 237 242    No results found for: VD25OH  Clinical ASCVD: No  The ASCVD Risk score (Arnett DK, et al., 2019) failed to calculate for the following reasons:   The valid total cholesterol range is 130 to 320 mg/dL    Other: (CHADS2VASc if Afib, PHQ9 if depression, MMRC or CAT for COPD, ACT, DEXA)  Social History   Tobacco Use  Smoking Status Every Day   Packs/day: 1.00   Years: 46.00   Pack years: 46.00   Types: Cigarettes  Smokeless Tobacco Never   BP Readings from Last 3 Encounters:  08/24/21 120/80  08/13/21 138/74  04/04/21 (!) 164/111   Pulse Readings from Last 3 Encounters:  08/13/21 78  04/04/21 81  04/03/21 80   Wt Readings from Last 3 Encounters:  08/13/21 184 lb 2 oz (83.5 kg)  04/15/21 188 lb (85.3 kg)  04/04/21 188 lb 12.8 oz (85.6 kg)    Assessment: Review of patient past medical history, allergies, medications, health status, including review of consultants reports, laboratory and other test data, was performed as part of comprehensive evaluation and provision of chronic care management services.   SDOH:  (Social Determinants of Health) assessments and interventions performed:    CCM Care Plan  Allergies  Allergen Reactions   Ciprofloxacin Hcl Rash   Enbrel [Etanercept] Rash   Metformin And Related Diarrhea    Medications Reviewed Today     Reviewed by Lavera Guise, [facility] (Pharmacist) on 08/25/21 at 2122  Med List Status: <None>   Medication Order Taking? Sig Documenting Provider Last Dose  Status Informant  Alpha-Lipoic Acid 600 MG CAPS [id number] No Take 1 capsule (600 mg total) by mouth daily. For neuropathy Ronnie Doss M, DO Taking Active   amLODipine (NORVASC) 5 MG tablet [id number] No Take 1 tablet (5 mg total) by mouth daily. Ronnie Doss M, DO Taking Active   Ascorbic Acid (VITAMIN C) 500 MG CHEW [id number] No 1 tablet [provider] Taking Active   aspirin 81 MG tablet [id number] No Take 81 mg by mouth daily. [provider] Taking Active Self  atenolol (TENORMIN) 25 MG tablet [id number] No TAKE 1 TABLET BY MOUTH DAILY. Ronnie Doss M, DO Taking Active   azelastine (OPTIVAR) 0.05 % ophthalmic solution [id number] No in the morning and at bedtime. [provider] Taking Active   budesonide (ENTOCORT EC) 3 MG 24 hr capsule [id number] No Take 3 capsules (9 mg total) by mouth daily. [name], [name], PA-C Taking Active   buPROPion (WELLBUTRIN XL) 300 MG 24 hr tablet [id number] No Take 1 tablet (300 mg total) by mouth daily. Ronnie Doss M, DO Taking Active   busPIRone (BUSPAR) 10 MG tablet [id number] No TAKE (1) TABLET TWICE A DAY. Ronnie Doss M, DO Taking Active   cholecalciferol (VITAMIN D) 1000 UNITS tablet [id number] No  Take 1,000 Units by mouth daily. [provider] Taking Active Self  citalopram (CELEXA) 40 MG tablet [id number] No TAKE 1 TABLET EVERY DAY Ronnie Doss M, DO Taking Active   Cyanocobalamin (VITAMIN B 12 PO) [id number] No Take by mouth. [provider] Taking Active   diclofenac (VOLTAREN) 75 MG EC tablet [id number] No TAKE ONE TABLET BY MOUTH TWICE DAILY AS NEEDED FOR MODERATE PAIN (USE SPARINGLY) Ronnie Doss M, DO Taking Active   diphenoxylate-atropine (LOMOTIL) 2.5-0.025 MG tablet [id number] No TAKE 1 OR 2 TABLETS FOUR TIMES DAILY AS NEEDED Gatha Mayer, MD Taking Active   empagliflozin (JARDIANCE) 25 MG TABS tablet [id number] No Take 25 mg by mouth daily. [provider] Taking  Active            Med Note Parthenia Ames Jul 31, 2021 37:85 PM) SAMPLES  folic acid (FOLVITE) 1 MG tablet [id number] No Take 1 mg by mouth daily. [provider] Taking Active   gabapentin (NEURONTIN) 600 MG tablet [id number] No Take 2 tablets (1,200 mg total) by mouth 3 (three) times daily. Ronnie Doss M, DO Taking Active   glipiZIDE (GLUCOTROL XL) 5 MG 24 hr tablet [id number] No Take 1 tablet (5 mg total) by mouth 2 (two) times daily with a meal. Ronnie Doss M, DO Taking Active   HYDROcodone-acetaminophen (NORCO/VICODIN) 5-325 MG per tablet [id number] No Take 1 tablet by mouth every 6 (six) hours as needed. Lysbeth Penner, FNP Taking Active Self  losartan (COZAAR) 100 MG tablet [id number] No TAKE 1 TABLET DAILY Ronnie Doss M, DO Taking Active   methotrexate (RHEUMATREX) 2.5 MG tablet [id number] No Take 15 mg by mouth once a week. [provider] Taking Active   omeprazole (PRILOSEC) 20 MG capsule [id number] No Take 1 capsule (20 mg total) by mouth daily. Donna Norlander, DO Taking Active   Oyster Shell Calcium 500 MG TABS [id number] No 1 tablet with meals [provider] Taking Active   Pyridoxine HCl (VITAMIN B-6 PO) [id number] No Take by mouth. [provider] Taking Active   Semaglutide (RYBELSUS) 14 MG TABS [id number] No Take 14 mg by mouth daily. Donna Norlander, DO Taking Active            Med Note Leisa Lenz May 14, 2021  1:57 PM) VIA NOVO [place] PATIENT ASSISTANCE PROGRAM  simvastatin (ZOCOR) 40 MG tablet [id number] No TAKE 1 TABLET DAILY Donna Norlander, DO Taking Active   SULFASALAZINE PO [id number] No Take by mouth. [provider] Taking Active            Med Note Georgiann Cocker, AMY E   Tue Apr 15, 2021  4:00 PM) 500 mg BID  zolpidem (AMBIEN) 5 MG tablet [id number] No Take 1 tablet at bedtime as needed for sleep (use sparingly). Donna Norlander, DO Taking Active             Patient Active  Problem List   Diagnosis Date Noted   Degeneration of lumbar intervertebral disc 04/15/2021   Osteopenia 04/15/2021   Primary osteoarthritis 04/15/2021   Carpal tunnel syndrome of right wrist 07/17/2020   Dysphagia 03/27/2020   Generalized anxiety disorder with panic attacks 09/01/2019   Hyperlipidemia associated with type 2 diabetes mellitus ([place]) 12/28/2018   Chronic pain of right knee 12/28/2018   Tobacco abuse 04/10/2016   Lymphocytic colitis 12/03/2015   DM2 (diabetes mellitus, type 2) ([place]) 10/11/2015   Depressive disorder 06/17/2015  Fatigue 06/17/2015   Weakness 06/03/2015   Diarrhea 05/13/2015   Hypertension associated with diabetes ([place])    OBESITY 01/21/2009   EROSIVE ESOPHAGITIS 01/21/2009   GASTROESOPHAGEAL REFLUX DISEASE, CHRONIC 01/21/2009   Rheumatoid arthritis ([place]) 01/21/2009   TENDINITIS 01/21/2009   EDEMA 01/21/2009    Immunization History  Administered Date(s) Administered   Fluad Quad(high Dose 65+) 05/03/2019   Influenza,inj,Quad PF,6+ Mos 05/25/2013, 07/31/2015, 06/18/2016, 05/16/2018   Influenza-Unspecified 05/07/2019, 07/24/2020, 05/08/2021   PPD Test 05/08/2013   Pneumococcal Conjugate-13 05/16/2018   Pneumococcal Polysaccharide-23 07/03/2019   Tdap 12/28/2018   Zoster Recombinat (Shingrix) 06/06/2019, 06/30/2019, 10/27/2019    Conditions to be addressed/monitored: HLD and DMII  Care Plan : PHARMD MEDICATION MANAGEMENT  Updates made by Lavera Guise, [place] since 08/25/2021 12:00 AM     Problem: DISEASE PROGRESSION PREVENTION      Long-Range Goal: T2DM, HLD   Recent Progress: Not on track  Priority: High  Note:   Current Barriers:  Unable to independently afford treatment regimen Unable to achieve control of T2DM   Pharmacist Clinical Goal(s):  Over the next 90 days, patient will verbalize ability to afford treatment regimen achieve control of T2DM as evidenced by IMPROVED GLYCEMIC CONTROL, GOAL A1C<7%  through collaboration with  PharmD and provider.   Interventions: 1:1 collaboration with Donna Norlander, DO regarding development and update of comprehensive plan of care as evidenced by provider attestation and co-signature Inter-disciplinary care team collaboration (see longitudinal plan of care) Comprehensive medication review performed; medication list updated in electronic medical record  Diabetes: Uncontrolled; current treatment:  RYBELSUS 14MG DAILY, JARDIANCE 25MG, glipizide GFR 66, A1C 9.5%-->11.8% (PATIENT HAS BEEN WITHOUT MEDICATION) CONTINUE JARDIANCE 25MG-->PATIENT STATES FARXIGA "DIDN'T WORK FOR HER" Tolerating well Application submitted to [employer] for patient assistance jardiance--PATIENT APPROVED FOR 2023--WILL BE SENT TO HER HOME CONTINUE RYBELSUS 44m daily [place] for RYBEL[place]. Patient assistance approved from 08/17/21 to 08/16/22.   FIRST SHIPMENT WILL GO OUT FIRST WEEK OF January 2023; Medication to ship to PCP office   Phone: [phone] personal and family history of Medullary thyroid cancer (MTC) CONTINUE GLIPIZIDE FOR NOW-->coming down in the 200s Discussed hyperlipidemia briefly LDL now at goal <70 Continue current regimen for now (SIMVA 40MG) Would prefer rosuvastatin, but no complaints from patient at this time Dietary/lifestyle modifications recommended Current glucose readings: fasting glucose: 200s, post prandial glucose: 250-300s  Free meter given today--contour next REPORTS hyperglycemic symptoms Discussed meal planning options and Plate method for healthy eating Avoid sugary drinks and desserts Incorporate balanced protein, non starchy veggies, 1 serving of carbohydrate with each meal Increase water intake Increase physical activity as able Current exercise: N/A Educated on TRANSITION TO JARDIANCE; PATIENT'S BLOOD SGUARS ARE >400 TODAY, DENIES ILLNESS AND N/V, INSTRUCTED PATIENT ON WHEN SHE SHOULD GO  TO ER; OFFERED TO START INSULIN TODAY IN OFFICE BUT PATIENT DECLINED; ENCOURAGED WATER, LOW CARB DIET Assessed patient finances. NOVO [place] ASS[place]ANCE PROGRAMS FOR 2022-23  Patient Goals/Self-Care Activities Over the next 90 days, patient will:  - take medications as prescribed collaborate with provider on medication access solutions  Follow Up Plan: Telephone follow up appointment with care management team member scheduled for: 1 MONTH      Medication Assistance: Application for [employer] (JARDIANCE), [place]RYBELSUS)  medication assistance program. in process.  Anticipated assistance start date TBD.  See plan of care for additional detail.  Patient's [facility] pharmacy is:  [facility]  [place], [place] [address] Phone: [phone] Fax: [phone]  [facility] [place], [place] [address] [place] [address] Phone: [phone] Fax: [phone]  Follow Up:  Patient agrees to Care Plan and Follow-up.  Plan: Telephone follow up appointment with care management team member scheduled for:  09/11/21   Regina Eck, PharmD, BCPS Clinical Pharmacist, [place]  II Phone [phone]

## 2021-08-23 NOTE — Addendum Note (Signed)
Addended by: Baruch Gouty on: 08/23/2021 12:33 AM   Modules accepted: Orders

## 2021-08-24 NOTE — Patient Instructions (Addendum)
Visit Information  Thank you for taking time to visit with me today. Please don't hesitate to contact me if I can be of assistance to you before our next scheduled telephone appointment.  Following are the goals we discussed today:  Current Barriers:  Unable to independently afford treatment regimen Unable to achieve control of T2DM   Pharmacist Clinical Goal(s):  Over the next 90 days, patient will verbalize ability to afford treatment regimen achieve control of T2DM as evidenced by IMPROVED GLYCEMIC CONTROL, GOAL A1C<7% through collaboration with PharmD and provider.   Interventions: 1:1 collaboration with [name], DO regarding development and update of comprehensive plan of care as evidenced by provider attestation and co-signature Inter-disciplinary care team collaboration (see longitudinal plan of care) Comprehensive medication review performed; medication list updated in electronic medical record  Diabetes: Uncontrolled; current treatment:  RYBELSUS 14MG  DAILY, JARDIANCE 25MG , glipizide GFR 66, A1C 9.5%-->11.8% (PATIENT HAS BEEN WITHOUT MEDICATION) CONTINUE JARDIANCE 25MG -->PATIENT STATES FARXIGA "DIDN'T WORK FOR HER" Tolerating well Application submitted to [employer] for patient assistance jardiance--PATIENT APPROVED FOR 2023--WILL BE SENT TO [place] 14mg  daily Received notification from [place] regarding RE-ENROLLMENT approval for RYBELSUS. Patient assistance approved from 08/17/21 to 08/16/22.   FIRST SHIPMENT WILL GO OUT FIRST WEEK OF January 2023; Medication to ship to PCP office   Phone: [phone] Denies personal and family history of Medullary thyroid cancer (MTC) CONTINUE GLIPIZIDE FOR NOW-->coming down in the 200s Discussed hyperlipidemia briefly LDL now at goal <70 Continue current regimen for now (SIMVA 40MG ) Would prefer rosuvastatin, but no complaints from patient at this time Dietary/lifestyle modifications  recommended Current glucose readings: fasting glucose: 200s, post prandial glucose: 250-300s  Free meter given today--contour next REPORTS hyperglycemic symptoms Discussed meal planning options and Plate method for healthy eating Avoid sugary drinks and desserts Incorporate balanced protein, non starchy veggies, 1 serving of carbohydrate with each meal Increase water intake Increase physical activity as able Current exercise: N/A Educated on TRANSITION TO JARDIANCE; PATIENT'S BLOOD SGUARS ARE >400 TODAY, DENIES ILLNESS AND N/V, INSTRUCTED PATIENT ON WHEN SHE SHOULD GO TO ER; OFFERED TO START INSULIN TODAY IN OFFICE BUT PATIENT DECLINED; ENCOURAGED WATER, LOW CARB DIET Assessed patient finances. NOVO [place] PATIENT ASSISTANCE PROGRAMS FOR 2022-23  Patient Goals/Self-Care Activities Over the next 90 days, patient will:  - take medications as prescribed collaborate with provider on medication access solutions  Follow Up Plan: Telephone follow up appointment with care management team member scheduled for: 1 MONTH    Please call the care guide team at [phone] if you need to cancel or reschedule your appointment.   If you are experiencing a Mental Health or [place] or need someone to talk to, please call the Suicide and Crisis Lifeline: 988 call the [place] National Suicide Prevention Lifeline: 7278245603 or TTY: (319)787-5536 TTY (581)127-2251) to talk to a trained counselor   The patient verbalized understanding of instructions, educational materials, and care plan provided today and declined offer to receive copy of patient instructions, educational materials, and care plan.   Signature Regina Eck, PharmD, BCPS Clinical Pharmacist, [place]  II Phone [phone]

## 2021-08-29 ENCOUNTER — Ambulatory Visit: Payer: Medicare Other | Admitting: Orthopedic Surgery

## 2021-08-29 ENCOUNTER — Other Ambulatory Visit: Payer: Self-pay

## 2021-08-29 ENCOUNTER — Encounter: Payer: Self-pay | Admitting: Orthopedic Surgery

## 2021-08-29 VITALS — BP 124/79 | HR 78 | Ht 61.0 in | Wt 185.2 lb

## 2021-08-29 DIAGNOSIS — M75121 Complete rotator cuff tear or rupture of right shoulder, not specified as traumatic: Secondary | ICD-10-CM | POA: Diagnosis not present

## 2021-08-29 NOTE — Patient Instructions (Signed)

## 2021-08-29 NOTE — Progress Notes (Signed)
New Patient Visit  Assessment: Donna Rogers is a 71 y.o. female with the following: 1. Complete tear of right rotator cuff, unspecified whether traumatic; chronic  Plan: Reviewed radiographs and the MRI with the patient and her husband in clinic today.  She has a chronic, full-thickness tear of the rotator cuff, with retraction to the level of the glenoid.  There is proximal humeral migration.  She has pretty good overall function at this time, however her biggest complaint is pain.  We discussed a couple of treatment options including medications, an injection and the possibility of proceeding with surgery.  She would like to proceed with an injection.  We did discuss the procedure briefly in clinic today.  Home exercises were provided.  She will follow-up as needed.  Procedure note injection - Right shoulder    Verbal consent was obtained to inject the right shoulder, subacromial space Timeout was completed to confirm the site of injection.   The skin was prepped with alcohol and ethyl chloride was sprayed at the injection site.  A 21-gauge needle was used to inject 40 mg of Depo-Medrol and 1% lidocaine (3 cc) into the subacromial space of the right shoulder using a posterolateral approach.  There were no complications.  A sterile bandage was applied.   Follow-up: Return if symptoms worsen or fail to improve.  Subjective:  Chief Complaint  Patient presents with   Shoulder Pain    RT shoulder/ painful x 3 1/2 months Patient fell about 3 months ago    History of Present Illness: Donna Rogers is a 71 y.o. female who has been referred to clinic today by Darla Lesches, [place] for evaluation of right shoulder pain.  She states that she fell approximately 3-4 months ago.  Since then, she has had severe pain in the right shoulder.  This is associated with decreased function.  Prior to this, she had no issues with her right shoulder.  Medications have not improved her symptoms at this  time.  Her pain gets worse at night.  She has difficulty sleeping at times.  No prior injections.  She is not working with physical therapy.   Review of Systems: No fevers or chills No numbness or tingling No chest pain No shortness of breath No bowel or bladder dysfunction No GI distress No headaches   Medical History:  Past Medical History:  Diagnosis Date   Allergy    seasonal allergies   Anemia    hx of   Arthritis    rheumatoid   Depression    on meds   Diabetes ([place])    Diabetic peripheral neuropathy (HCC)    GERD (gastroesophageal reflux disease)    on meds   Headache    Hyperlipidemia    on meds   Hyperplastic rectal polyp 10/21/2015   Hypertension    pt. denies   Lymphocytic colitis    Neuropathy    Rheumatoid arthritis ([place])    Shortness of breath dyspnea    with exertion    Past Surgical History:  Procedure Laterality Date   BREAST LUMPECTOMY Right 2018   BREAST LUMPECTOMY WITH RADIOACTIVE SEED LOCALIZATION Left 03/03/2016   Procedure: BREAST LUMPECTOMY WITH RADIOACTIVE SEED LOCALIZATION;  Surgeon: Coralie Keens, MD;  Location: [facility];  Service: General;  Laterality: Left;   COLONOSCOPY  08/2002   RMR: internal hemorrhoids   COLONOSCOPY N/A 10/21/2015   Procedure: COLONOSCOPY;  Surgeon: Daneil Dolin, MD;  [facility];  Service: Endoscopy;  Laterality: N/A;  250 - moved to 2:35 - office to notify pt   CYST REMOVAL TRUNK     ESOPHAGOGASTRODUODENOSCOPY  08/2002   RMR: nonerosive reflux esophagitis, hiatal hernia   TONSILLECTOMY     WISDOM TOOTH EXTRACTION      Family History  Problem Relation Age of Onset   Alzheimer's disease Mother    Lung cancer Mother 22   Diabetes Father    Heart disease Father    Arthritis Sister    Diabetes Sister    Diabetes Brother    Hypertension Brother    Colon cancer Neg Hx    Stomach cancer Neg Hx    Colon polyps Neg Hx    Esophageal cancer Neg Hx    Rectal cancer Neg Hx    Social History    Tobacco Use   Smoking status: Every Day    Packs/day: 1.00    Years: 46.00    Pack years: 46.00    Types: Cigarettes   Smokeless tobacco: Never  Vaping Use   Vaping Use: Some days  Substance Use Topics   Alcohol use: No    Alcohol/week: 0.0 standard drinks   Drug use: No    Allergies  Allergen Reactions   Ciprofloxacin Hcl Rash   Enbrel [Etanercept] Rash   Metformin And Related Diarrhea    Current Meds  Medication Sig   Alpha-Lipoic Acid 600 MG CAPS Take 1 capsule (600 mg total) by mouth daily. For neuropathy   amLODipine (NORVASC) 5 MG tablet Take 1 tablet (5 mg total) by mouth daily.   Ascorbic Acid (VITAMIN C) 500 MG CHEW 1 tablet   aspirin 81 MG tablet Take 81 mg by mouth daily.   atenolol (TENORMIN) 25 MG tablet TAKE 1 TABLET BY MOUTH DAILY.   azelastine (OPTIVAR) 0.05 % ophthalmic solution in the morning and at bedtime.   budesonide (ENTOCORT EC) 3 MG 24 hr capsule Take 3 capsules (9 mg total) by mouth daily.   buPROPion (WELLBUTRIN XL) 300 MG 24 hr tablet Take 1 tablet (300 mg total) by mouth daily.   busPIRone (BUSPAR) 10 MG tablet TAKE (1) TABLET TWICE A DAY.   cholecalciferol (VITAMIN D) 1000 UNITS tablet Take 1,000 Units by mouth daily.   citalopram (CELEXA) 40 MG tablet TAKE 1 TABLET EVERY DAY   Cyanocobalamin (VITAMIN B 12 PO) Take by mouth.   diclofenac (VOLTAREN) 75 MG EC tablet TAKE ONE TABLET BY MOUTH TWICE DAILY AS NEEDED FOR MODERATE PAIN (USE SPARINGLY)   diphenoxylate-atropine (LOMOTIL) 2.5-0.025 MG tablet TAKE 1 OR 2 TABLETS FOUR TIMES DAILY AS NEEDED   empagliflozin (JARDIANCE) 25 MG TABS tablet Take 25 mg by mouth daily.   folic acid (FOLVITE) 1 MG tablet Take 1 mg by mouth daily.   gabapentin (NEURONTIN) 600 MG tablet Take 2 tablets (1,200 mg total) by mouth 3 (three) times daily.   glipiZIDE (GLUCOTROL XL) 5 MG 24 hr tablet Take 1 tablet (5 mg total) by mouth 2 (two) times daily with a meal.   HYDROcodone-acetaminophen (NORCO/VICODIN) 5-325 MG  per tablet Take 1 tablet by mouth every 6 (six) hours as needed.   losartan (COZAAR) 100 MG tablet TAKE 1 TABLET DAILY   methotrexate (RHEUMATREX) 2.5 MG tablet Take 15 mg by mouth once a week.   omeprazole (PRILOSEC) 20 MG capsule Take 1 capsule (20 mg total) by mouth daily.   Oyster Shell Calcium 500 MG TABS 1 tablet with meals   Pyridoxine HCl (VITAMIN B-6 PO) Take  by mouth.   Semaglutide (RYBELSUS) 14 MG TABS Take 14 mg by mouth daily.   simvastatin (ZOCOR) 40 MG tablet TAKE 1 TABLET DAILY   SULFASALAZINE PO Take by mouth.   zolpidem (AMBIEN) 5 MG tablet Take 1 tablet at bedtime as needed for sleep (use sparingly).    Objective: BP 124/79    Pulse 78    Ht 5\' 1"  (1.549 m)    Wt 185 lb 3.2 oz (84 kg)    BMI 34.99 kg/m   Physical Exam:  General: Alert and oriented. and No acute distress. Gait: Normal gait.  Evaluation of right shoulder demonstrates a mild swelling over the dorsal aspect of the scapula.  Mild tenderness to palpation over this area.  No overlying redness.  Restricted right shoulder range of motion.  She is able to get her arm above the level of her shoulder.  Fingers are warm and well-perfused.  No loss of sensation throughout the right shoulder and arm.   IMAGING: I personally reviewed images previously obtained in clinic  X-ray of the right shoulder demonstrates proximal humeral migration.  The humeral head is abutting the undersurface of the acromion.  Minimal degenerative changes otherwise.  MRI demonstrates complete, full-thickness tearing of the supra spinatus and infraspinatus with retraction to the level of the glenoid.  The upper border of the subscapularis is also torn and mildly retracted.  There is atrophy of the supraspinatus and infraspinatus muscle bellies.   New Medications:  No orders of the defined types were placed in this encounter.     Mordecai Rasmussen, MD  08/29/2021 11:32 PM

## 2021-09-02 NOTE — Progress Notes (Signed)
Received notification from [employer] regarding approval for RYBELSUS. Patient assistance approved from 08/21/21 to 07/16/22.  Phone: [phone]

## 2021-09-08 ENCOUNTER — Telehealth: Payer: Self-pay

## 2021-09-08 NOTE — Chronic Care Management (AMB) (Unsigned)
°  Care Management   Note  09/08/2021 Name: Donna Rogers MRN: [id number] DOB: 1951/08/04  Donna Rogers is a 71 y.o. year old female who is a primary care patient of [name], DO and is actively engaged with the care management team. I reached out to [name] by phone today to assist with re-scheduling a follow up visit with the Licensed Clinical Social Worker  Follow up plan: Unsuccessful telephone outreach attempt made.  The care management team will reach out to the patient again over the next 7 days.  If patient returns call to provider office, please advise to call [facility]  at [place], [place], [place], [place] [address] Direct Dial: [phone] [email or web address]: [email or web address]

## 2021-09-10 ENCOUNTER — Other Ambulatory Visit: Payer: Self-pay | Admitting: Family Medicine

## 2021-09-10 DIAGNOSIS — F32A Depression, unspecified: Secondary | ICD-10-CM

## 2021-09-11 ENCOUNTER — Ambulatory Visit: Payer: Medicare Other | Admitting: Pharmacist

## 2021-09-16 ENCOUNTER — Encounter: Payer: Self-pay | Admitting: Family Medicine

## 2021-09-16 ENCOUNTER — Ambulatory Visit (INDEPENDENT_AMBULATORY_CARE_PROVIDER_SITE_OTHER): Payer: Medicare Other | Admitting: Family Medicine

## 2021-09-16 VITALS — BP 122/72 | HR 93 | Temp 97.6°F | Ht 61.0 in | Wt 188.6 lb

## 2021-09-16 DIAGNOSIS — E1169 Type 2 diabetes mellitus with other specified complication: Secondary | ICD-10-CM

## 2021-09-16 DIAGNOSIS — K2100 Gastro-esophageal reflux disease with esophagitis, without bleeding: Secondary | ICD-10-CM | POA: Diagnosis not present

## 2021-09-16 DIAGNOSIS — Z79891 Long term (current) use of opiate analgesic: Secondary | ICD-10-CM | POA: Diagnosis not present

## 2021-09-16 DIAGNOSIS — E785 Hyperlipidemia, unspecified: Secondary | ICD-10-CM | POA: Diagnosis not present

## 2021-09-16 DIAGNOSIS — F5104 Psychophysiologic insomnia: Secondary | ICD-10-CM | POA: Diagnosis not present

## 2021-09-16 LAB — BAYER DCA HB A1C WAIVED: HB A1C (BAYER DCA - WAIVED): 10 % — ABNORMAL HIGH (ref 4.8–5.6)

## 2021-09-16 MED ORDER — OMEPRAZOLE 40 MG PO CPDR: 40.0000 mg | DELAYED_RELEASE_CAPSULE | Freq: Every day | ORAL | 3 refills | Status: DC

## 2021-09-16 MED ORDER — ZALEPLON 5 MG PO CAPS: 5.0000 mg | ORAL_CAPSULE | Freq: Every evening | ORAL | 1 refills | Status: DC | PRN

## 2021-09-16 NOTE — Patient Instructions (Signed)
Replace Ambien with Sonata for sleep if needed. Remember only take as directed on the package.  This is for your safety.  Sugar is NOT at goal.

## 2021-09-16 NOTE — Progress Notes (Signed)
Subjective: CC:DM PCP: [name], DO [id number] Donna Rogers is Donna 71 y.o. female presenting to clinic today for:  1. Type 2 Diabetes with hypertension, hyperlipidemia:  Blood sugar noted to be grossly uncontrolled at last visit.  She was referred to our clinical pharmacist, who has her continued on Jardiance, Rybelsus and glipizide.  Blood sugars reported earlier this year were in the 200s.  She is getting patient assistance for the Jardiance, Rybelsus.  She was given Donna free meter to check blood sugars at home.  She is compliant with simvastatin 40, Cozaar 100 mg and Norvasc 5 mg.  Also on atenolol 25 mg daily.  She denies any missed doses of medication.  She reports some blurred vision.  No polydipsia, polyuria, chest pain or shortness of breath  Last eye exam: UTD Last foot exam: UTD, 10/2021 due Last A1c:  Lab Results  Component Value Date   HGBA1C 11.8 (H) 04/04/2021   Nephropathy screen indicated?: on ARB Last flu, zoster and/or pneumovax:  Immunization History  Administered Date(s) Administered   Fluad Quad(high Dose 65+) 05/03/2019   Influenza,inj,Quad PF,6+ Mos 05/25/2013, 07/31/2015, 06/18/2016, 05/16/2018   Influenza-Unspecified 05/07/2019, 07/24/2020, 05/08/2021   PPD Test 05/08/2013   Pneumococcal Conjugate-13 05/16/2018   Pneumococcal Polysaccharide-23 07/03/2019   Tdap 12/28/2018   Zoster Recombinat (Shingrix) 06/06/2019, 06/30/2019, 10/27/2019    2. GERD She reports GERD is uncontrolled and sometimes it wakes her up from sleep refluxing into her throat.  No nausea, vomiting or blood in stool reported but sometimes she will see some blood in her throat.  Currently taking Prilosec 20 mg daily  3. Insomnia Reports poor control of insomnia.  Has not taken Ambien in quite some time but had no difficulty with it.  She admits that 5 mg is insufficient and she was often using 10 mg when she needed it.   ROS: Per HPI  Allergies  Allergen Reactions    Ciprofloxacin Hcl Rash   Enbrel [Etanercept] Rash   Metformin And Related Diarrhea   Past Medical History:  Diagnosis Date   Allergy    seasonal allergies   Anemia    hx of   Arthritis    rheumatoid   Depression    on meds   Diabetes ([place])    Diabetic peripheral neuropathy (HCC)    GERD (gastroesophageal reflux disease)    on meds   Headache    Hyperlipidemia    on meds   Hyperplastic rectal polyp 10/21/2015   Hypertension    pt. denies   Lymphocytic colitis    Neuropathy    Rheumatoid arthritis (HCC)    Shortness of breath dyspnea    with exertion    Current Outpatient Medications:    Alpha-Lipoic Acid 600 MG CAPS, Take 1 capsule (600 mg total) by mouth daily. For neuropathy, Disp: 90 capsule, Rfl: 3   amLODipine (NORVASC) 5 MG tablet, TAKE ONE TABLET ONCE DAILY, Disp: 30 tablet, Rfl: 0   Ascorbic Acid (VITAMIN C) 500 MG CHEW, 1 tablet, Disp: , Rfl:    aspirin 81 MG tablet, Take 81 mg by mouth daily., Disp: , Rfl:    atenolol (TENORMIN) 25 MG tablet, TAKE 1 TABLET BY MOUTH DAILY., Disp: 90 tablet, Rfl: 1   azelastine (OPTIVAR) 0.05 % ophthalmic solution, in the morning and at bedtime., Disp: , Rfl:    budesonide (ENTOCORT EC) 3 MG 24 hr capsule, Take 3 capsules (9 mg total) by mouth daily., Disp: 270 capsule, Rfl: 0  buPROPion (WELLBUTRIN XL) 300 MG 24 hr tablet, TAKE ONE TABLET ONCE DAILY, Disp: 30 tablet, Rfl: 0   busPIRone (BUSPAR) 10 MG tablet, TAKE (1) TABLET TWICE Donna DAY., Disp: 60 tablet, Rfl: 1   cholecalciferol (VITAMIN D) 1000 UNITS tablet, Take 1,000 Units by mouth daily., Disp: , Rfl:    citalopram (CELEXA) 40 MG tablet, TAKE 1 TABLET EVERY DAY, Disp: 30 tablet, Rfl: 0   Cyanocobalamin (VITAMIN B 12 PO), Take by mouth., Disp: , Rfl:    diclofenac (VOLTAREN) 75 MG EC tablet, TAKE ONE TABLET BY MOUTH TWICE DAILY AS NEEDED FOR MODERATE PAIN (USE SPARINGLY), Disp: 30 tablet, Rfl: 0   diphenoxylate-atropine (LOMOTIL) 2.5-0.025 MG tablet, TAKE 1 OR 2 TABLETS FOUR  TIMES DAILY AS NEEDED, Disp: 90 tablet, Rfl: 1   empagliflozin (JARDIANCE) 25 MG TABS tablet, Take 25 mg by mouth daily., Disp: , Rfl:    folic acid (FOLVITE) 1 MG tablet, Take 1 mg by mouth daily., Disp: , Rfl:    gabapentin (NEURONTIN) 600 MG tablet, Take 2 tablets (1,200 mg total) by mouth 3 (three) times daily., Disp: 180 tablet, Rfl: 0   glipiZIDE (GLUCOTROL XL) 5 MG 24 hr tablet, Take 1 tablet (5 mg total) by mouth 2 (two) times daily with Donna meal., Disp: 180 tablet, Rfl: 2   HYDROcodone-acetaminophen (NORCO/VICODIN) 5-325 MG per tablet, Take 1 tablet by mouth every 6 (six) hours as needed., Disp: 60 tablet, Rfl: 0   losartan (COZAAR) 100 MG tablet, TAKE 1 TABLET DAILY, Disp: 90 tablet, Rfl: 1   methotrexate (RHEUMATREX) 2.5 MG tablet, Take 15 mg by mouth once Donna week., Disp: , Rfl:    omeprazole (PRILOSEC) 20 MG capsule, Take 1 capsule (20 mg total) by mouth daily., Disp: 90 capsule, Rfl: 0   Oyster Shell Calcium 500 MG TABS, 1 tablet with meals, Disp: , Rfl:    Pyridoxine HCl (VITAMIN B-6 PO), Take by mouth., Disp: , Rfl:    Semaglutide (RYBELSUS) 14 MG TABS, Take 14 mg by mouth daily., Disp: 30 tablet, Rfl: 0   simvastatin (ZOCOR) 40 MG tablet, TAKE 1 TABLET DAILY, Disp: 90 tablet, Rfl: 1   SULFASALAZINE PO, Take by mouth., Disp: , Rfl:    zolpidem (AMBIEN) 5 MG tablet, Take 1 tablet at bedtime as needed for sleep (use sparingly)., Disp: 20 tablet, Rfl: 1 Social History   Socioeconomic History   Marital status: Significant Other    Spouse name: Not on file   Number of children: 1   Years of education: 11th   Highest education level: 11th grade  Occupational History   Occupation: retired  Tobacco Use   Smoking status: Every Day    Packs/day: 1.00    Years: 46.00    Pack years: 46.00    Types: Cigarettes   Smokeless tobacco: Never  Vaping Use   Vaping Use: Some days  Substance and Sexual Activity   Alcohol use: No    Alcohol/week: 0.0 standard drinks   Drug use: No   Sexual  activity: Not Currently  Other Topics Concern   Not on file  Social History Narrative   The patient is divorced she has 1 son who is married that she has Donna grandchild.   She does not use alcohol or drugs she drinks 3-4 caffeinated beverages daily   She is Donna smoker   She lives with her boyfriend   Social Determinants of Radio broadcast assistant Strain: Medium Risk   Difficulty of Paying Living  Expenses: Somewhat hard  Food Insecurity: No Food Insecurity   Worried About Charity fundraiser in the Last Year: Never true   Ran Out of Food in the Last Year: Never true  Transportation Needs: No Transportation Needs   Lack of Transportation (Medical): No   Lack of Transportation (Non-Medical): No  Physical Activity: Inactive   Days of Exercise per Week: 0 days   Minutes of Exercise per Session: 0 min  Stress: Stress Concern Present   Feeling of Stress : To some extent  Social Connections: Moderately Isolated   Frequency of Communication with Friends and Family: More than three times Donna week   Frequency of Social Gatherings with Friends and Family: More than three times Donna week   Attends Religious Services: Never   Marine scientist or Organizations: No   Attends Music therapist: Never   Marital Status: Living with partner  Intimate Partner Violence: Not At Risk   Fear of Current or Ex-Partner: No   Emotionally Abused: No   Physically Abused: No   Sexually Abused: No   Family History  Problem Relation Age of Onset   Alzheimer's disease Mother    Lung cancer Mother 62   Diabetes Father    Heart disease Father    Arthritis Sister    Diabetes Sister    Diabetes Brother    Hypertension Brother    Colon cancer Neg Hx    Stomach cancer Neg Hx    Colon polyps Neg Hx    Esophageal cancer Neg Hx    Rectal cancer Neg Hx     Objective: Office vital signs reviewed. BP 122/72    Pulse 93    Temp 97.6 F (36.4 C)    Ht 5\' 1"  (1.549 m)    Wt 188 lb 9.6 oz (85.5  kg)    SpO2 97%    BMI 35.64 kg/m   Physical Examination:  General: Awake, alert, nontoxic-appearing female, No acute distress HEENT: Sclera white Cardio: regular rate and rhythm, S1S2 heard, no murmurs appreciated Pulm: clear to auscultation bilaterally, no wheezes, rhonchi or rales; normal work of breathing on room air Extremities: warm, well perfused, No edema, cyanosis or clubbing; +2 pulses bilaterally Psych: Depressed Depression screen [facility] 2/9 09/16/2021 07/24/2021 04/04/2021  Decreased Interest 2 1 1   Down, Depressed, Hopeless 3 1 1   PHQ - 2 Score 5 2 2   Altered sleeping 3 1 3   Tired, decreased energy 3 2 3   Change in appetite 1 0 0  Feeling bad or failure about yourself  3 1 1   Trouble concentrating 0 1 0  Moving slowly or fidgety/restless 2 1 3   Suicidal thoughts 0 0 0  PHQ-9 Score 17 8 12   Difficult doing work/chores Somewhat difficult Somewhat difficult Somewhat difficult  Some recent data might be hidden   GAD 7 : Generalized Anxiety Score 09/16/2021 04/04/2021 12/20/2020 10/15/2020  Nervous, Anxious, on Edge 2 0 1 0  Control/stop worrying 0 3 3 0  Worry too much - different things 1 3 3  0  Trouble relaxing 2 3 3 2   Restless 1 0 1 0  Easily annoyed or irritable 1 0 1 1  Afraid - awful might happen 2 1 3  0  Total GAD 7 Score 9 10 15 3   Anxiety Difficulty Somewhat difficult Somewhat difficult Somewhat difficult -   Assessment/ Plan: 71 y.o. female   Type 2 diabetes mellitus with other specified complication, without long-term current use of insulin (  [place]) - Plan: Bayer DCA Hb A1c Waived  Psychophysiological insomnia - Plan: zaleplon (SONATA) 5 MG capsule, ToxASSURE Select 13 (MW), Urine  Gastroesophageal reflux disease with esophagitis without hemorrhage - Plan: omeprazole (PRILOSEC) 40 MG capsule  Sugar remains uncontrolled with A1c of 10.0 today.  She is had Donna slight decrease compared to her check in August but still not at goal.  I wonder if we might consider  discontinuing the Rybelsus and placing her on Ozempic, which we can try at higher doses.  I fear that she may be starting to show evidence of pancreatic failure such that she will ultimately need insulin therapies.  She is very reluctant to do any injection therapies but I have asked that she strongly consider GLP once weekly.  Will CC Almyra Free to follow-up on this as well  Trial of Sonata 5 mg at bedtime instead of the Ambien.  I counseled her at length on taking the medication only as directed as these are for safety reasons.  UDS and CSC were updated   GERD is not well controlled.  PPI increased to 40 mg daily.  Would like her to see her gastroenterologist if she has ongoing symptoms  Orders Placed This Encounter  Procedures   Bayer DCA Hb A1c Waived   ToxASSURE Select 13 (MW), Urine   Meds ordered this encounter  Medications   omeprazole (PRILOSEC) 40 MG capsule    Sig: Take 1 capsule (40 mg total) by mouth daily.    Dispense:  90 capsule    Refill:  3   zaleplon (SONATA) 5 MG capsule    Sig: Take 1 capsule (5 mg total) by mouth at bedtime as needed for sleep.    Dispense:  30 capsule    Refill:  1    The Narcotic Database has been reviewed.  There were no red flags.  She is currently under the care of of Donna pain specialist  [place], [place] [phone]

## 2021-09-19 ENCOUNTER — Telehealth: Payer: Medicare Other

## 2021-09-21 LAB — TOXASSURE SELECT 13 (MW), URINE

## 2021-09-24 ENCOUNTER — Ambulatory Visit: Payer: Medicare Other | Admitting: Family Medicine

## 2021-09-29 ENCOUNTER — Telehealth: Payer: Self-pay | Admitting: Family Medicine

## 2021-09-29 NOTE — Telephone Encounter (Signed)
Pt states she is completely out of Jardiance and we don't have any samples. Is she getting these through the mail? Pt is very confused about how she gets refills, and pt is aware you are out of office today but will be back tomorrow.

## 2021-09-30 ENCOUNTER — Other Ambulatory Visit (HOSPITAL_COMMUNITY): Payer: Self-pay

## 2021-09-30 ENCOUNTER — Telehealth: Payer: Self-pay

## 2021-09-30 NOTE — Telephone Encounter (Signed)
Call returned to patient, but no answer and no VM

## 2021-09-30 NOTE — Telephone Encounter (Signed)
ATTEMPTED TO CALL PT IN REGARDS TO [place]. NO VOICEMAIL AVAIL. WILL CALL AGAIN.

## 2021-10-01 ENCOUNTER — Ambulatory Visit (INDEPENDENT_AMBULATORY_CARE_PROVIDER_SITE_OTHER): Payer: Medicare Other | Admitting: Pharmacist

## 2021-10-01 ENCOUNTER — Ambulatory Visit: Payer: Medicare Other | Admitting: Family Medicine

## 2021-10-01 DIAGNOSIS — E1169 Type 2 diabetes mellitus with other specified complication: Secondary | ICD-10-CM

## 2021-10-01 DIAGNOSIS — E785 Hyperlipidemia, unspecified: Secondary | ICD-10-CM

## 2021-10-01 NOTE — Progress Notes (Signed)
Chronic Care Management Pharmacy Note  10/01/2021 Name:  Donna Rogers MRN:  [id number] DOB:  07/21/1951  Summary: T2DM  Recommendations/Changes made from today's visit: Diabetes: Uncontrolled; current treatment:  RYBELSUS 14MG DAILY, JARDIANCE 25MG, glipizide GFR 66, A1C 11.8%-->10% compliance issues/financial constraints/social barriers CONTINUE JARDIANCE 25MG-->PATIENT STATES FARXIGA "DIDN'T WORK FOR HER" Tolerating well We are out of jardiance samples; patient willing to try farxiga again-->samples of farxiga 80m daily left up front Application submitted to [employer] cares for patient assistance jardiance--patient denied; needs to apply for LIS Application for LIS/medicare extra help submitted today 10/01/21 CONTINUE RYBELSUS 121mdaily Received notification from [employer] [place] RE-ENROLLMENT approval for RYBELSUS. Patient assistance approved from 08/17/21 to 08/16/22.   FIRST SHIPMENT WILL GO OUT FIRST WEEK OF January 2023; Medication to ship to PCP office   Phone: [phone] personal and family history of Medullary thyroid cancer (MTC) CONTINUE GLIPIZIDE FOR NOW-->BG low 200s  Subjective: Donna Rogers an [age].o. year old female who is a primary patient of GoJanora NorlanderDO.  The CCM team was consulted for assistance with disease management and care coordination needs.    Engaged with patient by telephone for follow up visit in response to provider referral for pharmacy case management and/or care coordination services.   Consent to Services:  The patient was given information about Chronic Care Management services, agreed to services, and gave verbal consent prior to initiation of services.  Please see initial visit note for detailed documentation.   Patient Care Team: GoJanora NorlanderDO as PCP - General (Family Medicine) FoShea EvansiNorva RiffleLCSW as Social Worker (Licensed Clinical Social Worker) PrBlanca FriendJuRoyce MacadamiaRPNorthlake Endoscopy Centers Pharmacist (Family  Medicine) GeGatha MayerMD as Consulting Physician (Gastroenterology) [facility] (Specialist)  Objective:  Lab Results  Component Value Date   CREATININE 0.78 04/04/2021   CREATININE 0.94 10/15/2020   CREATININE 0.98 09/08/2019    Lab Results  Component Value Date   HGBA1C 10.0 (H) 09/16/2021   Last diabetic Eye exam:  Lab Results  Component Value Date/Time   HMDIABEYEEXA No Retinopathy 04/04/2021 12:00 AM    Last diabetic Foot exam: No results found for: HMDIABFOOTEX      Component Value Date/Time   CHOL 123 10/15/2020 1134   TRIG 184 (H) 10/15/2020 1134   HDL 29 (L) 10/15/2020 1134   CHOLHDL 4.2 10/15/2020 1134   LDLCALC 63 10/15/2020 1134    Hepatic Function Latest Ref Rng & Units 10/15/2020 12/28/2018 08/03/2018  Total Protein 6.0 - 8.5 g/dL 6.9 7.3 6.8  Albumin 3.8 - 4.8 g/dL 4.0 4.3 4.1  AST 0 - 40 IU/L 16 33 34  ALT 0 - 32 IU/L 19 32 50(H)  Alk Phosphatase 44 - 121 IU/L 94 89 99  Total Bilirubin 0.0 - 1.2 mg/dL 0.3 0.4 0.2    Lab Results  Component Value Date/Time   TSH 1.790 07/03/2019 02:28 PM   TSH 0.706 08/03/2018 01:43 PM    CBC Latest Ref Rng & Units 10/15/2020 08/29/2019 08/03/2018  WBC 3.4 - 10.8 x10E3/uL 8.8 9.4 8.0  Hemoglobin 11.1 - 15.9 g/dL 14.7 14.2 14.3  Hematocrit 34.0 - 46.6 % 45.4 43.9 41.2  Platelets 150 - 450 x10E3/uL 269 237 242    No results found for: VD25OH  Clinical ASCVD: No  The ASCVD Risk score (Arnett DK, et al., 2019) failed to calculate for the following reasons:   The valid total cholesterol range is 130 to 320 mg/dL  Other: (CHADS2VASc if Afib, PHQ9 if depression, MMRC or CAT for COPD, ACT, DEXA)  Social History   Tobacco Use  Smoking Status Every Day   Packs/day: 1.00   Years: 46.00   Pack years: 46.00   Types: Cigarettes  Smokeless Tobacco Never   BP Readings from Last 3 Encounters:  09/16/21 122/72  08/29/21 124/79  08/24/21 120/80   Pulse Readings from Last 3 Encounters:  09/16/21 93   08/29/21 78  08/13/21 78   Wt Readings from Last 3 Encounters:  09/16/21 188 lb 9.6 oz (85.5 kg)  08/29/21 185 lb 3.2 oz (84 kg)  08/13/21 184 lb 2 oz (83.5 kg)    Assessment: Review of patient past medical history, allergies, medications, health status, including review of consultants reports, laboratory and other test data, was performed as part of comprehensive evaluation and provision of chronic care management services.   SDOH:  (Social Determinants of Health) assessments and interventions performed:    CCM Care Plan  Allergies  Allergen Reactions   Ciprofloxacin Hcl Rash   Enbrel [Etanercept] Rash   Metformin And Related Diarrhea    Medications Reviewed Today     Reviewed by Lavera Guise, [facility] (Pharmacist) on 10/01/21 at [phone]  Med List Status: <None>   Medication Order Taking? Sig Documenting Provider Last Dose Status Informant  Alpha-Lipoic Acid 600 MG CAPS [id number] No Take 1 capsule (600 mg total) by mouth daily. For neuropathy [name], DO Taking Active   amLODipine (NORVASC) 5 MG tablet [id number] No TAKE ONE TABLET ONCE DAILY Ronnie Doss M, DO Taking Active   Ascorbic Acid (VITAMIN C) 500 MG CHEW [id number] No 1 tablet [provider] Taking Active   aspirin 81 MG tablet [id number] No Take 81 mg by mouth daily. [provider] Taking Active Self  atenolol (TENORMIN) 25 MG tablet [id number] No TAKE 1 TABLET BY MOUTH DAILY. Ronnie Doss M, DO Taking Active   azelastine (OPTIVAR) 0.05 % ophthalmic solution [id number] No in the morning and at bedtime. [provider] Taking Active   budesonide (ENTOCORT EC) 3 MG 24 hr capsule [id number] No Take 3 capsules (9 mg total) by mouth daily. [name], [name], PA-C Taking Active   buPROPion (WELLBUTRIN XL) 300 MG 24 hr tablet [id number] No TAKE ONE TABLET ONCE DAILY Ronnie Doss M, DO Taking Active   busPIRone (BUSPAR) 10 MG tablet [id number] No TAKE (1) TABLET TWICE A DAY.  Ronnie Doss M, DO Taking Active   cholecalciferol (VITAMIN D) 1000 UNITS tablet [id number] No Take 1,000 Units by mouth daily. [provider] Taking Active Self  citalopram (CELEXA) 40 MG tablet [id number] No TAKE 1 TABLET EVERY DAY Ronnie Doss M, DO Taking Active   Cyanocobalamin (VITAMIN B 12 PO) [id number] No Take by mouth. [provider] Taking Active   diclofenac (VOLTAREN) 75 MG EC tablet [id number] No TAKE ONE TABLET BY MOUTH TWICE DAILY AS NEEDED FOR MODERATE PAIN (USE SPARINGLY) Ronnie Doss M, DO Taking Active   diphenoxylate-atropine (LOMOTIL) 2.5-0.025 MG tablet [id number] No TAKE 1 OR 2 TABLETS FOUR TIMES DAILY AS NEEDED Gatha Mayer, MD Taking Active   empagliflozin (JARDIANCE) 25 MG TABS tablet [id number] No Take 25 mg by mouth daily. [provider] Taking Active            Med Note Parthenia Ames Jul 31, 2021 48:54 PM) SAMPLES  folic acid (FOLVITE) 1 MG tablet [id number] No Take 1 mg by mouth daily. [provider] Taking Active   gabapentin (NEURONTIN) 600 MG tablet [id number] No Take 2 tablets (1,200 mg total) by mouth 3 (three) times daily. Ronnie Doss M, DO Taking Active   glipiZIDE (GLUCOTROL XL) 5 MG 24 hr tablet [id number] No Take 1 tablet (5 mg total) by mouth 2 (two) times daily with a meal. Ronnie Doss M, DO Taking Active   HYDROcodone-acetaminophen (NORCO/VICODIN) 5-325 MG per tablet [id number] No Take 1 tablet by mouth every 6 (six) hours as needed. Lysbeth Penner, FNP Taking Active Self  losartan (COZAAR) 100 MG tablet [id number] No TAKE 1 TABLET DAILY Ronnie Doss M, DO Taking Active   methotrexate (RHEUMATREX) 2.5 MG tablet [id number] No Take 15 mg by mouth once a week. [provider] Taking Active   omeprazole (PRILOSEC) 40 MG capsule [id number]  Take 1 capsule (40 mg total) by mouth daily. Ronnie Doss M, DO  Active   Oyster Shell Calcium 500 MG TABS [id number] No 1 tablet with  meals [provider] Taking Active   Pyridoxine HCl (VITAMIN B-6 PO) [id number] No Take by mouth. [provider] Taking Active   Semaglutide (RYBELSUS) 14 MG TABS [id number] No Take 14 mg by mouth daily. [name], DO Taking Active            Med Note Leisa Lenz May 14, 2021  1:57 PM) VIA NOVO [place] PATIENT ASSISTANCE PROGRAM  simvastatin (ZOCOR) 40 MG tablet [id number] No TAKE 1 TABLET DAILY [name], DO Taking Active   SULFASALAZINE PO [id number] No Take by mouth. [provider] Taking Active            Med Note Georgiann Cocker, AMY E   Tue Apr 15, 2021  4:00 PM) 500 mg BID  zaleplon (SONATA) 5 MG capsule [id number]  Take 1 capsule (5 mg total) by mouth at bedtime as needed for sleep. [name], DO  Active             Patient Active Problem List   Diagnosis Date Noted   Degeneration of lumbar intervertebral disc 04/15/2021   Osteopenia 04/15/2021   Primary osteoarthritis 04/15/2021   Carpal tunnel syndrome of right wrist 07/17/2020   Dysphagia 03/27/2020   Generalized anxiety disorder with panic attacks 09/01/2019   Hyperlipidemia associated with type 2 diabetes mellitus ([place]) 12/28/2018   Chronic pain of right knee 12/28/2018   Tobacco abuse 04/10/2016   Lymphocytic colitis 12/03/2015   DM2 (diabetes mellitus, type 2) ([place]) 10/11/2015   Depressive disorder 06/17/2015   Fatigue 06/17/2015   Weakness 06/03/2015   Diarrhea 05/13/2015   Hypertension associated with diabetes ([place])    OBESITY 01/21/2009   EROSIVE ESOPHAGITIS 01/21/2009   GASTROESOPHAGEAL REFLUX DISEASE, CHRONIC 01/21/2009   Rheumatoid arthritis ([place]) 01/21/2009   TENDINITIS 01/21/2009   EDEMA 01/21/2009    Immunization History  Administered Date(s) Administered   Fluad Quad(high Dose 65+) 05/03/2019   Influenza,inj,Quad PF,6+ Mos 05/25/2013, 07/31/2015, 06/18/2016, 05/16/2018   Influenza-Unspecified 05/07/2019, 07/24/2020, 05/08/2021   PPD  Test 05/08/2013   Pneumococcal Conjugate-13 05/16/2018   Pneumococcal Polysaccharide-23 07/03/2019   Tdap 12/28/2018   Zoster Recombinat (Shingrix) 06/06/2019, 06/30/2019, 10/27/2019    Conditions to be addressed/monitored: HLD and DMII  Care Plan : PHARMD MEDICATION MANAGEMENT  Updates made by Lavera Guise, RPH since 10/01/2021 12:00 AM     Problem: DISEASE PROGRESSION PREVENTION      Long-Range Goal: T2DM, HLD   Recent Progress: Not on track  Priority:  High  Note:   Current Barriers:  Unable to independently afford treatment regimen Unable to achieve control of T2DM   Pharmacist Clinical Goal(s):  Over the next 90 days, patient will verbalize ability to afford treatment regimen achieve control of T2DM as evidenced by IMPROVED GLYCEMIC CONTROL, GOAL A1C<7%  through collaboration with PharmD and provider.   Interventions: 1:1 collaboration with [name], DO regarding development and update of comprehensive plan of care as evidenced by provider attestation and co-signature Inter-disciplinary care team collaboration (see longitudinal plan of care) Comprehensive medication review performed; medication list updated in electronic medical record  Diabetes: Uncontrolled; current treatment:  RYBELSUS 14MG DAILY, JARDIANCE 25MG, glipizide GFR 66, A1C 11.8%-->10% compliance issues/financial constraints/social barriers CONTINUE JARDIANCE 25MG-->PATIENT [place] "DIDN'T WORK FOR HER" Tolerating well We are out of jardiance samples; patient willing to try farxiga again-->samples of farxiga 65m daily left up front Application submitted to [employer] cares for patient assistance jardiance--patient denied; needs to apply for LIS Application for LIS/medicare extra help submitted today CONTINUE RYBELSUS 176mdaily Received notification from [employer] [place] RE-ENROLLMENT approval for RYBELSUS. Patient assistance approved from 08/17/21 to 08/16/22.   FIRST SHIPMENT WILL GO  OUT FIRST WEEK OF January 2023; Medication to ship to PCP office   Phone: [phone] personal and family history of Medullary thyroid cancer (MTC) CONTINUE GLIPIZIDE FOR NOW-->coming down in the 200s Discussed hyperlipidemia briefly LDL now at goal <70 Continue current regimen for now (SIMVA 40MG) Would prefer rosuvastatin, but no complaints from patient at this time Dietary/lifestyle modifications recommended Current glucose readings: fasting glucose: 200s, post prandial glucose: 250-300s  Free meter given today--contour next REPORTS hyperglycemic symptoms Discussed meal planning options and Plate method for healthy eating Avoid sugary drinks and desserts Incorporate balanced protein, non starchy veggies, 1 serving of carbohydrate with each meal Increase water intake Increase physical activity as able Current exercise: N/A Educated on TRANSITION TO JARDIANCE; PATIENT'S BLOOD SGUARS ARE >400 TODAY, DENIES ILLNESS AND N/V, INSTRUCTED PATIENT ON WHEN SHE SHOULD GO TO ER; OFFERED TO START INSULIN TODAY IN OFFICE BUT PATIENT DECLINED; ENCOURAGED WATER, LOW CARB DIET Assessed patient finances. NOVO [place]IENT ASSISTANCE PROGRAMS FOR [id number] submitted for lis/extra help  Patient Goals/Self-Care Activities Over the next 90 days, patient will:  - take medications as prescribed collaborate with provider on medication access solutions  Follow Up Plan: Telephone follow up appointment with care management team member scheduled for: 1 MONTH      Medication Assistance:  rybelsus obtained through [employer] medication assistance program.  Enrollment ends 08/16/22 Extra help/lis pending 10/01/21  Patient's preferred pharmacy is:  [id number]: [phone]: [address] [id number]: [phone]: [phone] pill box? No - may need Pt endorses 80% compliance-financial/emotional  Follow Up:  Patient agrees to Care Plan and Follow-up.  Plan: Telephone follow up appointment with care management team member scheduled for:  10/2021   JuRegina EckPharmD, BCPS Clinical Pharmacist, [place] Phone [phone]

## 2021-10-01 NOTE — Patient Instructions (Signed)
Visit Information  Following are the goals we discussed today:  Current Barriers:  Unable to independently afford treatment regimen Unable to achieve control of T2DM   Pharmacist Clinical Goal(s):  Over the next 90 days, patient will verbalize ability to afford treatment regimen achieve control of T2DM as evidenced by IMPROVED GLYCEMIC CONTROL, GOAL A1C<7% through collaboration with PharmD and provider.   Interventions: 1:1 collaboration with [name], DO regarding development and update of comprehensive plan of care as evidenced by provider attestation and co-signature Inter-disciplinary care team collaboration (see longitudinal plan of care) Comprehensive medication review performed; medication list updated in electronic medical record  Diabetes: Uncontrolled; current treatment:  RYBELSUS 14MG  DAILY, JARDIANCE 25MG , glipizide GFR 66, A1C 11.8%-->10% compliance issues/financial constraints/social barriers CONTINUE JARDIANCE 25MG -->PATIENT STATES FARXIGA "DIDN'T WORK FOR HER" Tolerating well We are out of jardiance samples; patient willing to try farxiga again-->samples of farxiga 10mg  daily left up front Application submitted to [facility] cares for patient assistance jardiance--patient denied; needs to apply for LIS Application for LIS/medicare extra help submitted today CONTINUE RYBELSUS 14mg  daily Received notification from [place] regarding RE-ENROLLMENT approval for RYBELSUS. Patient assistance approved from 08/17/21 to 08/16/22.   FIRST SHIPMENT WILL GO OUT FIRST WEEK OF January 2023; Medication to ship to PCP office   Phone: [phone] Denies personal and family history of Medullary thyroid cancer (MTC) CONTINUE GLIPIZIDE FOR NOW-->coming down in the 200s Discussed hyperlipidemia briefly LDL now at goal <70 Continue current regimen for now (SIMVA 40MG ) Would prefer rosuvastatin, but no complaints from patient at this time Dietary/lifestyle modifications  recommended Current glucose readings: fasting glucose: 200s, post prandial glucose: 250-300s  Free meter given today--contour next REPORTS hyperglycemic symptoms Discussed meal planning options and Plate method for healthy eating Avoid sugary drinks and desserts Incorporate balanced protein, non starchy veggies, 1 serving of carbohydrate with each meal Increase water intake Increase physical activity as able Current exercise: N/A Educated on TRANSITION TO JARDIANCE; PATIENT'S BLOOD SGUARS ARE >400 TODAY, DENIES ILLNESS AND N/V, INSTRUCTED PATIENT ON WHEN SHE SHOULD GO TO ER; OFFERED TO START INSULIN TODAY IN OFFICE BUT PATIENT DECLINED; ENCOURAGED WATER, LOW CARB DIET Assessed patient finances. NOVO [place] PATIENT ASSISTANCE PROGRAMS FOR 0932-35; application submitted for lis/extra help  Patient Goals/Self-Care Activities Over the next 90 days, patient will:  - take medications as prescribed collaborate with provider on medication access solutions  Follow Up Plan: Telephone follow up appointment with care management team member scheduled for: 1 MONTH   Plan: Telephone follow up appointment with care management team member scheduled for:  10/2021  Signature Regina Eck, PharmD, BCPS Clinical Pharmacist, [place]  II Phone [phone]   Please call the care guide team at [phone] if you need to cancel or reschedule your appointment.   The patient verbalized understanding of instructions, educational materials, and care plan provided today and agreed to receive a mailed copy of patient instructions, educational materials, and care plan.

## 2021-10-07 ENCOUNTER — Other Ambulatory Visit: Payer: Self-pay | Admitting: Family Medicine

## 2021-10-09 NOTE — Chronic Care Management (AMB) (Unsigned)
°  Care Management   Note  10/09/2021 Name: [name] MRN: [id number] DOB: 14-May-1951  Donna Rogers is a 71 y.o. year old female who is a primary care patient of [name], DO and is actively engaged with the care management team. I reached out to [name] by phone today to assist with re-scheduling a follow up visit with the Licensed Clinical Social Worker  Follow up plan: Unsuccessful telephone outreach attempt made. A HIPAA compliant phone message was left for the patient providing contact information and requesting a return call.  The care management team will reach out to the patient again over the next 7 days.  If patient returns call to provider office, please advise to call [place]  at [address], [address], [place], [place] [address] Direct Dial: [phone] [email or web address]: [email or web address]

## 2021-10-13 ENCOUNTER — Telehealth: Payer: Self-pay | Admitting: Acute Care

## 2021-10-13 ENCOUNTER — Other Ambulatory Visit: Payer: Self-pay | Admitting: Family Medicine

## 2021-10-13 DIAGNOSIS — F32A Depression, unspecified: Secondary | ICD-10-CM

## 2021-10-13 NOTE — Telephone Encounter (Signed)
Left detailed message for pt to call back to schedule f/u lung screening CT scan.

## 2021-10-21 ENCOUNTER — Telehealth (INDEPENDENT_AMBULATORY_CARE_PROVIDER_SITE_OTHER): Payer: Medicare Other | Admitting: Family Medicine

## 2021-10-21 DIAGNOSIS — F5104 Psychophysiologic insomnia: Secondary | ICD-10-CM

## 2021-10-21 MED ORDER — BELSOMRA 10 MG PO TABS
10.0000 mg | ORAL_TABLET | Freq: Every evening | ORAL | 0 refills | Status: DC | PRN
Start: 2021-10-21 — End: 2023-10-26

## 2021-10-21 NOTE — Progress Notes (Signed)
Phone visit ? ?Subjective: ?[id number] ?PCP: [name], DO ?[id number] Donna Rogers is Donna 71 y.o. female. Patient provides verbal consent for consult held via video. ? ?Due to COVID-19 pandemic this visit was conducted virtually. This visit type was conducted due to national recommendations for restrictions regarding the COVID-19 Pandemic (e.g. social distancing, sheltering in place) in an effort to limit this patient's exposure and mitigate transmission in our community. All issues noted in this document were discussed and addressed.  Donna physical exam was not performed with this format.  ? ?Location of patient: home ?Location of provider: [facility] ?Others present for call: none ? ?1.  Insomnia ?Patient reports that the 5 mg of Sonata was not sufficient so she had been doubling them and was only getting fair sleep with that.  She is also failed Ambien.  She would like either to be sent to sleep medicine or try something else for sleep.  She admits that her pain has been bothersome in both her shoulder and back which may be contributing to some of her symptoms.  Today she is had diarrheal symptoms. ? ? ?ROS: Per HPI ? ?Allergies  ?Allergen Reactions  ? Ciprofloxacin Hcl Rash  ? Enbrel [Etanercept] Rash  ? Metformin And Related Diarrhea  ? ?Past Medical History:  ?Diagnosis Date  ? Allergy   ? seasonal allergies  ? Anemia   ? hx of  ? Arthritis   ? rheumatoid  ? Depression   ? on meds  ? Diabetes ([place])   ? Diabetic peripheral neuropathy ([place])   ? GERD (gastroesophageal reflux disease)   ? on meds  ? Headache   ? Hyperlipidemia   ? on meds  ? Hyperplastic rectal polyp 10/21/2015  ? Hypertension   ? pt. denies  ? Lymphocytic colitis   ? Neuropathy   ? Rheumatoid arthritis ([place])   ? Shortness of breath dyspnea   ? with exertion  ? ? ?Current Outpatient Medications:  ?  Alpha-Lipoic Acid 600 MG CAPS, Take 1 capsule (600 mg total) by mouth daily. For neuropathy, Disp: 90 capsule, Rfl: 3 ?  amLODipine (NORVASC) 5 MG  tablet, TAKE ONE TABLET ONCE DAILY, Disp: 30 tablet, Rfl: 0 ?  Ascorbic Acid (VITAMIN C) 500 MG CHEW, 1 tablet, Disp: , Rfl:  ?  aspirin 81 MG tablet, Take 81 mg by mouth daily., Disp: , Rfl:  ?  atenolol (TENORMIN) 25 MG tablet, TAKE 1 TABLET BY MOUTH DAILY., Disp: 90 tablet, Rfl: 1 ?  azelastine (OPTIVAR) 0.05 % ophthalmic solution, in the morning and at bedtime., Disp: , Rfl:  ?  budesonide (ENTOCORT EC) 3 MG 24 hr capsule, Take 3 capsules (9 mg total) by mouth daily., Disp: 270 capsule, Rfl: 0 ?  buPROPion (WELLBUTRIN XL) 300 MG 24 hr tablet, TAKE ONE TABLET ONCE DAILY, Disp: 30 tablet, Rfl: 0 ?  busPIRone (BUSPAR) 10 MG tablet, TAKE (1) TABLET TWICE Donna DAY., Disp: 60 tablet, Rfl: 1 ?  cholecalciferol (VITAMIN D) 1000 UNITS tablet, Take 1,000 Units by mouth daily., Disp: , Rfl:  ?  citalopram (CELEXA) 40 MG tablet, TAKE 1 TABLET EVERY DAY, Disp: 30 tablet, Rfl: 0 ?  Cyanocobalamin (VITAMIN B 12 PO), Take by mouth., Disp: , Rfl:  ?  diclofenac (VOLTAREN) 75 MG EC tablet, TAKE ONE TABLET BY MOUTH TWICE DAILY AS NEEDED FOR MODERATE PAIN (USE SPARINGLY), Disp: 30 tablet, Rfl: 0 ?  diphenoxylate-atropine (LOMOTIL) 2.5-0.025 MG tablet, TAKE 1 OR 2 TABLETS FOUR TIMES DAILY AS NEEDED,  Disp: 90 tablet, Rfl: 1 ?  empagliflozin (JARDIANCE) 25 MG TABS tablet, Take 25 mg by mouth daily., Disp: , Rfl:  ?  folic acid (FOLVITE) 1 MG tablet, Take 1 mg by mouth daily., Disp: , Rfl:  ?  gabapentin (NEURONTIN) 600 MG tablet, Take 2 tablets (1,200 mg total) by mouth 3 (three) times daily., Disp: 180 tablet, Rfl: 0 ?  glipiZIDE (GLUCOTROL XL) 5 MG 24 hr tablet, Take 1 tablet (5 mg total) by mouth 2 (two) times daily with Donna meal., Disp: 180 tablet, Rfl: 2 ?  HYDROcodone-acetaminophen (NORCO/VICODIN) 5-325 MG per tablet, Take 1 tablet by mouth every 6 (six) hours as needed., Disp: 60 tablet, Rfl: 0 ?  losartan (COZAAR) 100 MG tablet, TAKE 1 TABLET DAILY, Disp: 90 tablet, Rfl: 1 ?  methotrexate (RHEUMATREX) 2.5 MG tablet, Take 15 mg by  mouth once Donna week., Disp: , Rfl:  ?  omeprazole (PRILOSEC) 40 MG capsule, Take 1 capsule (40 mg total) by mouth daily., Disp: 90 capsule, Rfl: 3 ?  Oyster Shell Calcium 500 MG TABS, 1 tablet with meals, Disp: , Rfl:  ?  Pyridoxine HCl (VITAMIN B-6 PO), Take by mouth., Disp: , Rfl:  ?  Semaglutide (RYBELSUS) 14 MG TABS, Take 14 mg by mouth daily., Disp: 30 tablet, Rfl: 0 ?  simvastatin (ZOCOR) 40 MG tablet, TAKE 1 TABLET DAILY, Disp: 90 tablet, Rfl: 1 ?  SULFASALAZINE PO, Take by mouth., Disp: , Rfl:  ?  zaleplon (SONATA) 5 MG capsule, Take 1 capsule (5 mg total) by mouth at bedtime as needed for sleep., Disp: 30 capsule, Rfl: 1 ? ?Assessment/ Plan: ?71 y.o. female  ? ?Psychophysiological insomnia - Plan: Suvorexant (BELSOMRA) 10 MG TABS ? ?We will trial Belsomra.  National narcotic database reviewed and there were no red flags.  I reiterated not to use more of her medication that is prescribed.  We will reconvene in 1 month and if needed we can advance her to the 15 mg at that time ? ?Start time: 10:32am; 10:43am (called) ?End time: 10:50a ? ?Total time spent on patient care (including video visit/ documentation): 7 minutes ? ?[name], DO ?[place] ?[phone] ? ? ?

## 2021-10-29 ENCOUNTER — Telehealth: Payer: Medicare Other

## 2021-10-30 ENCOUNTER — Other Ambulatory Visit: Payer: Self-pay | Admitting: Family Medicine

## 2021-10-31 ENCOUNTER — Telehealth: Payer: Self-pay | Admitting: *Deleted

## 2021-10-31 NOTE — Telephone Encounter (Signed)
Pt assistance RYBELSUS 14 mg - #4 boxes of #30 tabs here = up front for pt pick up  ? ?Pt aware  ?

## 2021-11-04 ENCOUNTER — Telehealth: Payer: Medicare Other | Admitting: Pharmacist

## 2021-11-05 ENCOUNTER — Other Ambulatory Visit: Payer: Self-pay | Admitting: Family Medicine

## 2021-11-05 DIAGNOSIS — G629 Polyneuropathy, unspecified: Secondary | ICD-10-CM

## 2021-11-05 DIAGNOSIS — E1142 Type 2 diabetes mellitus with diabetic polyneuropathy: Secondary | ICD-10-CM

## 2021-11-18 DIAGNOSIS — M1991 Primary osteoarthritis, unspecified site: Secondary | ICD-10-CM | POA: Diagnosis not present

## 2021-11-18 DIAGNOSIS — K52832 Lymphocytic colitis: Secondary | ICD-10-CM | POA: Diagnosis not present

## 2021-11-18 DIAGNOSIS — M0579 Rheumatoid arthritis with rheumatoid factor of multiple sites without organ or systems involvement: Secondary | ICD-10-CM | POA: Diagnosis not present

## 2021-11-18 DIAGNOSIS — R5382 Chronic fatigue, unspecified: Secondary | ICD-10-CM | POA: Diagnosis not present

## 2021-11-18 DIAGNOSIS — M5136 Other intervertebral disc degeneration, lumbar region: Secondary | ICD-10-CM | POA: Diagnosis not present

## 2021-11-18 DIAGNOSIS — M8580 Other specified disorders of bone density and structure, unspecified site: Secondary | ICD-10-CM | POA: Diagnosis not present

## 2021-11-18 NOTE — Chronic Care Management (AMB) (Signed)
?  Care Management  ? ?Note ? ?11/18/2021 ?Name: Donna Rogers MRN: [id number] DOB: [birth year] ? ?Donna Rogers is a 71 y.o. year old female who is a primary care patient of [name], DO and is actively engaged with the care management team. I reached out to [name] by phone today to assist with re-scheduling a follow up visit with the Licensed Clinical Social Worker ? ?Follow up plan: ?Unable to make contact on outreach attempts x 3. PCP [name], DO notified via routed documentation in medical record.  ? ?Noreene Larsson, RMA ?Care Guide, Embedded Care Coordination ?[facility]  ?[place], [place] [address] ?Direct Dial: [phone] ?[email or web address] ?Website: [email or web address]  ? ?

## 2021-11-19 ENCOUNTER — Ambulatory Visit (INDEPENDENT_AMBULATORY_CARE_PROVIDER_SITE_OTHER): Payer: Medicare Other | Admitting: Family Medicine

## 2021-11-19 ENCOUNTER — Other Ambulatory Visit: Payer: Self-pay | Admitting: Family Medicine

## 2021-11-19 ENCOUNTER — Encounter: Payer: Self-pay | Admitting: Family Medicine

## 2021-11-19 VITALS — BP 128/71 | HR 76 | Temp 96.3°F | Ht 61.0 in | Wt 198.6 lb

## 2021-11-19 DIAGNOSIS — F32A Depression, unspecified: Secondary | ICD-10-CM | POA: Diagnosis not present

## 2021-11-19 DIAGNOSIS — N390 Urinary tract infection, site not specified: Secondary | ICD-10-CM | POA: Diagnosis not present

## 2021-11-19 DIAGNOSIS — M0579 Rheumatoid arthritis with rheumatoid factor of multiple sites without organ or systems involvement: Secondary | ICD-10-CM

## 2021-11-19 DIAGNOSIS — G894 Chronic pain syndrome: Secondary | ICD-10-CM | POA: Diagnosis not present

## 2021-11-19 DIAGNOSIS — E1169 Type 2 diabetes mellitus with other specified complication: Secondary | ICD-10-CM | POA: Diagnosis not present

## 2021-11-19 LAB — URINALYSIS, ROUTINE W REFLEX MICROSCOPIC
Bilirubin, UA: NEGATIVE
Nitrite, UA: NEGATIVE
Protein,UA: NEGATIVE
RBC, UA: NEGATIVE
Specific Gravity, UA: 1.02 (ref 1.005–1.030)
Urobilinogen, Ur: 0.2 mg/dL (ref 0.2–1.0)
pH, UA: 5 (ref 5.0–7.5)

## 2021-11-19 LAB — MICROSCOPIC EXAMINATION: Renal Epithel, UA: NONE SEEN /hpf

## 2021-11-19 LAB — BAYER DCA HB A1C WAIVED: HB A1C (BAYER DCA - WAIVED): 7.3 % — ABNORMAL HIGH (ref 4.8–5.6)

## 2021-11-19 MED ORDER — HYDROCODONE-ACETAMINOPHEN 5-325 MG PO TABS: 1.0000 | ORAL_TABLET | Freq: Two times a day (BID) | ORAL | 0 refills | Status: DC | PRN

## 2021-11-19 MED ORDER — CEPHALEXIN 500 MG PO CAPS: 500.0000 mg | ORAL_CAPSULE | Freq: Two times a day (BID) | ORAL | 0 refills | Status: AC

## 2021-11-19 NOTE — Patient Instructions (Signed)
Controlled Substance Guidelines:  1. You cannot get an early refill, even it is lost.  2. You cannot get controlled medications from any other doctor, unless it is the emergency department and related to a new problem or injury.  3. You cannot use alcohol, marijuana, cocaine or any other recreational drugs while using this medication. This is very dangerous.  4. You are willing to have your urine drug tested at each visit.  5. You will not drive while using this medication, because that can put yourself and others in serious danger of an accident. 6. If any medication is stolen, then there must be a police report to verify it, or it cannot be refilled.  7. I will not prescribe these medications for longer than 3 months.  8. You must bring your pill bottle to each visit.  9. You must use the same pharmacy for all refills for the medication, unless you clear it with me beforehand.  10. You cannot share or sell this medication.   

## 2021-11-19 NOTE — Progress Notes (Signed)
? ?Subjective: ?CC:DM ?PCP: [name], DO ?IRJ:JOACZYSA Donna Rogers is Donna 71 y.o. female presenting to clinic today for: ? ?1. Type 2 Diabetes with hypertension, hyperlipidemia:  ?Compliant with all of her medications.  Blood sugars seem to be running better and are actually down to the 100s now.  She is very happy about this ? ?Last eye exam: Up-to-date ?Last foot exam: Needs ?Last A1c:  ?Lab Results  ?Component Value Date  ? HGBA1C 10.0 (H) 09/16/2021  ? ?Nephropathy screen indicated?:  On ARB ?Last flu, zoster and/or pneumovax:  ?Immunization History  ?Administered Date(s) Administered  ? Fluad Quad(high Dose 65+) 05/03/2019  ? Influenza,inj,Quad PF,6+ Mos 05/25/2013, 07/31/2015, 06/18/2016, 05/16/2018  ? Influenza-Unspecified 05/07/2019, 07/24/2020, 05/08/2021  ? PPD Test 05/08/2013  ? Pneumococcal Conjugate-13 05/16/2018  ? Pneumococcal Polysaccharide-23 07/03/2019  ? Tdap 12/28/2018  ? Zoster Recombinat (Shingrix) 06/06/2019, 06/30/2019, 10/27/2019  ? ? ?ROS: No chest pain or shortness of breath reported.  Had some mild dysuria ? ?2.  Rheumatoid arthritis ?Patient is under the care of Leafy Kindle with [facility] rheumatology.  She is currently treated with methotrexate, hydrocodone.  She notes that recently they discussed with me taking over her Norco and she was wondering if this was something I was willing to do.  She notes that she typically only uses around 1-2 times per month.  She is prescribed it every 6 hours but does not take it more than once per day when she does take it.  Over the last 1 to 2 weeks she has been using it more regularly once per day but no more than that.  No excessive daytime sedation or falls reported ? ?3.  Depression and anxiety ?Patient notes that the Belsomra was not effective for her insomnia.  She has now been treated with Belsomra, Ambien, and Sonata and none of these were helpful.  She is treated for depression and anxiety with Celexa, BuSpar and Wellbutrin.  She  would be willing to see Donna psychiatrist at this point as she does have many days where she is quite depressed. ? ? ?ROS: Per HPI ? ?Allergies  ?Allergen Reactions  ? Ciprofloxacin Hcl Rash  ? Enbrel [Etanercept] Rash  ? Metformin And Related Diarrhea  ? ?Past Medical History:  ?Diagnosis Date  ? Allergy   ? seasonal allergies  ? Anemia   ? hx of  ? Arthritis   ? rheumatoid  ? Depression   ? on meds  ? Diabetes ([place])   ? Diabetic peripheral neuropathy ([place])   ? GERD (gastroesophageal reflux disease)   ? on meds  ? Headache   ? Hyperlipidemia   ? on meds  ? Hyperplastic rectal polyp 10/21/2015  ? Hypertension   ? pt. denies  ? Lymphocytic colitis   ? Neuropathy   ? Rheumatoid arthritis ([place])   ? Shortness of breath dyspnea   ? with exertion  ? ? ?Current Outpatient Medications:  ?  Alpha-Lipoic Acid 600 MG CAPS, TAKE (1) CAPSULE DAILY FOR NERVE PAIN, Disp: 90 capsule, Rfl: 0 ?  amLODipine (NORVASC) 5 MG tablet, TAKE ONE TABLET ONCE DAILY, Disp: 30 tablet, Rfl: 0 ?  Ascorbic Acid (VITAMIN C) 500 MG CHEW, 1 tablet, Disp: , Rfl:  ?  aspirin 81 MG tablet, Take 81 mg by mouth daily., Disp: , Rfl:  ?  atenolol (TENORMIN) 25 MG tablet, TAKE 1 TABLET BY MOUTH DAILY., Disp: 90 tablet, Rfl: 1 ?  azelastine (OPTIVAR) 0.05 % ophthalmic solution, in the morning  and at bedtime., Disp: , Rfl:  ?  budesonide (ENTOCORT EC) 3 MG 24 hr capsule, Take 3 capsules (9 mg total) by mouth daily., Disp: 270 capsule, Rfl: 0 ?  buPROPion (WELLBUTRIN XL) 300 MG 24 hr tablet, TAKE ONE TABLET ONCE DAILY, Disp: 30 tablet, Rfl: 0 ?  busPIRone (BUSPAR) 10 MG tablet, TAKE (1) TABLET TWICE Donna DAY., Disp: 60 tablet, Rfl: 1 ?  cholecalciferol (VITAMIN D) 1000 UNITS tablet, Take 1,000 Units by mouth daily., Disp: , Rfl:  ?  citalopram (CELEXA) 40 MG tablet, TAKE 1 TABLET EVERY DAY, Disp: 30 tablet, Rfl: 0 ?  Cyanocobalamin (VITAMIN B 12 PO), Take by mouth., Disp: , Rfl:  ?  diclofenac (VOLTAREN) 75 MG EC tablet, TAKE ONE TABLET BY MOUTH TWICE DAILY AS NEEDED  FOR MODERATE PAIN (USE SPARINGLY), Disp: 30 tablet, Rfl: 0 ?  diphenoxylate-atropine (LOMOTIL) 2.5-0.025 MG tablet, TAKE 1 OR 2 TABLETS FOUR TIMES DAILY AS NEEDED, Disp: 90 tablet, Rfl: 1 ?  empagliflozin (JARDIANCE) 25 MG TABS tablet, Take 25 mg by mouth daily., Disp: , Rfl:  ?  folic acid (FOLVITE) 1 MG tablet, Take 1 mg by mouth daily., Disp: , Rfl:  ?  gabapentin (NEURONTIN) 600 MG tablet, TAKE TWO TABLETS THREE TIMES DAILY, Disp: 180 tablet, Rfl: 0 ?  glipiZIDE (GLUCOTROL XL) 5 MG 24 hr tablet, Take 1 tablet (5 mg total) by mouth 2 (two) times daily with Donna meal., Disp: 180 tablet, Rfl: 2 ?  HYDROcodone-acetaminophen (NORCO/VICODIN) 5-325 MG per tablet, Take 1 tablet by mouth every 6 (six) hours as needed., Disp: 60 tablet, Rfl: 0 ?  losartan (COZAAR) 100 MG tablet, TAKE 1 TABLET DAILY, Disp: 90 tablet, Rfl: 1 ?  methotrexate (RHEUMATREX) 2.5 MG tablet, Take 15 mg by mouth once Donna week., Disp: , Rfl:  ?  omeprazole (PRILOSEC) 40 MG capsule, Take 1 capsule (40 mg total) by mouth daily., Disp: 90 capsule, Rfl: 3 ?  Oyster Shell Calcium 500 MG TABS, 1 tablet with meals, Disp: , Rfl:  ?  Pyridoxine HCl (VITAMIN B-6 PO), Take by mouth., Disp: , Rfl:  ?  Semaglutide (RYBELSUS) 14 MG TABS, Take 14 mg by mouth daily., Disp: 30 tablet, Rfl: 0 ?  simvastatin (ZOCOR) 40 MG tablet, TAKE 1 TABLET DAILY, Disp: 90 tablet, Rfl: 1 ?  SULFASALAZINE PO, Take by mouth., Disp: , Rfl:  ?  Suvorexant (BELSOMRA) 10 MG TABS, Take 10 mg by mouth at bedtime as needed., Disp: 30 tablet, Rfl: 0 ?Social History  ? ?Socioeconomic History  ? Marital status: Significant Other  ?  Spouse name: Not on file  ? Number of children: 1  ? Years of education: 11th  ? Highest education level: 11th grade  ?Occupational History  ? Occupation: retired  ?Tobacco Use  ? Smoking status: Every Day  ?  Packs/day: 1.00  ?  Years: 46.00  ?  Pack years: 46.00  ?  Types: Cigarettes  ? Smokeless tobacco: Never  ?Vaping Use  ? Vaping Use: Some days  ?Substance and  Sexual Activity  ? Alcohol use: No  ?  Alcohol/week: 0.0 standard drinks  ? Drug use: No  ? Sexual activity: Not Currently  ?Other Topics Concern  ? Not on file  ?Social History Narrative  ? The patient is divorced she has 1 son who is married that she has Donna grandchild.  ? She does not use alcohol or drugs she drinks 3-4 caffeinated beverages daily  ? She is Donna smoker  ?  She lives with her boyfriend  ? ?Social Determinants of Health  ? ?Financial Resource Strain: Medium Risk  ? Difficulty of Paying Living Expenses: Somewhat hard  ?Food Insecurity: No Food Insecurity  ? Worried About Charity fundraiser in the Last Year: Never true  ? Ran Out of Food in the Last Year: Never true  ?Transportation Needs: No Transportation Needs  ? Lack of Transportation (Medical): No  ? Lack of Transportation (Non-Medical): No  ?Physical Activity: Inactive  ? Days of Exercise per Week: 0 days  ? Minutes of Exercise per Session: 0 min  ?Stress: Stress Concern Present  ? Feeling of Stress : To some extent  ?Social Connections: Moderately Isolated  ? Frequency of Communication with Friends and Family: More than three times Donna week  ? Frequency of Social Gatherings with Friends and Family: More than three times Donna week  ? Attends Religious Services: Never  ? Active Member of Clubs or Organizations: No  ? Attends Archivist Meetings: Never  ? Marital Status: Living with partner  ?Intimate Partner Violence: Not At Risk  ? Fear of Current or Ex-Partner: No  ? Emotionally Abused: No  ? Physically Abused: No  ? Sexually Abused: No  ? ?Family History  ?Problem Relation Age of Onset  ? Alzheimer's disease Mother   ? Lung cancer Mother 37  ? Diabetes Father   ? Heart disease Father   ? Arthritis Sister   ? Diabetes Sister   ? Diabetes Brother   ? Hypertension Brother   ? Colon cancer Neg Hx   ? Stomach cancer Neg Hx   ? Colon polyps Neg Hx   ? Esophageal cancer Neg Hx   ? Rectal cancer Neg Hx   ? ? ?Objective: ?Office vital signs  reviewed. ?BP 128/71   Pulse 76   Temp (!) 96.3 ?F (35.7 ?C)   Ht '5\' 1"'$  (1.549 m)   Wt 198 lb 9.6 oz (90.1 kg)   SpO2 95%   BMI 37.53 kg/m?  ? ?Physical Examination:  ?General: Awake, alert, nontoxic female, No ac

## 2021-11-20 LAB — CMP14+EGFR
ALT: 16 IU/L (ref 0–32)
AST: 18 IU/L (ref 0–40)
Albumin/Globulin Ratio: 2 (ref 1.2–2.2)
Albumin: 4.8 g/dL (ref 3.8–4.8)
Alkaline Phosphatase: 67 IU/L (ref 44–121)
BUN/Creatinine Ratio: 10 — ABNORMAL LOW (ref 12–28)
BUN: 9 mg/dL (ref 8–27)
Bilirubin Total: 0.4 mg/dL (ref 0.0–1.2)
CO2: 24 mmol/L (ref 20–29)
Calcium: 9.8 mg/dL (ref 8.7–10.3)
Chloride: 101 mmol/L (ref 96–106)
Creatinine, Ser: 0.94 mg/dL (ref 0.57–1.00)
Globulin, Total: 2.4 g/dL (ref 1.5–4.5)
Glucose: 124 mg/dL — ABNORMAL HIGH (ref 70–99)
Potassium: 4.5 mmol/L (ref 3.5–5.2)
Sodium: 142 mmol/L (ref 134–144)
Total Protein: 7.2 g/dL (ref 6.0–8.5)
eGFR: 66 mL/min/{1.73_m2} (ref >59)

## 2021-11-20 LAB — CBC WITH DIFFERENTIAL/PLATELET
Basophils Absolute: 0.1 10*3/uL (ref 0.0–0.2)
Basos: 1 %
EOS (ABSOLUTE): 0.2 10*3/uL (ref 0.0–0.4)
Eos: 3 %
Hematocrit: 42.2 % (ref 34.0–46.6)
Hemoglobin: 13.9 g/dL (ref 11.1–15.9)
Immature Grans (Abs): 0 10*3/uL (ref 0.0–0.1)
Immature Granulocytes: 0 %
Lymphocytes Absolute: 2.6 10*3/uL (ref 0.7–3.1)
Lymphs: 36 %
MCH: 30.5 pg (ref 26.6–33.0)
MCHC: 32.9 g/dL (ref 31.5–35.7)
MCV: 93 fL (ref 79–97)
Monocytes Absolute: 0.7 10*3/uL (ref 0.1–0.9)
Monocytes: 10 %
Neutrophils Absolute: 3.8 10*3/uL (ref 1.4–7.0)
Neutrophils: 50 %
Platelets: 303 10*3/uL (ref 150–450)
RBC: 4.55 x10E6/uL (ref 3.77–5.28)
RDW: 13 % (ref 11.7–15.4)
WBC: 7.4 10*3/uL (ref 3.4–10.8)

## 2021-11-21 LAB — URINE CULTURE

## 2021-11-25 ENCOUNTER — Telehealth: Payer: Medicare Other

## 2021-11-25 ENCOUNTER — Telehealth: Payer: Self-pay | Admitting: Licensed Clinical Social Worker

## 2021-11-25 NOTE — Telephone Encounter (Signed)
? ?  Chronic Care Management  ?  ? Clinical Social Work Note ?  ?11/25/21 ?Name: CONLEIGH HEINLEIN       MRN: [id number]       DOB: 1951-03-09 ?  ?GIUSEPPINA QUINONES is a 71 y.o. year old female who is a primary care patient of [name], DO. The CCM team was consulted to assist the patient with chronic disease management and/or care coordination needs related to: [employer] .  ? ?Unsuccessful phone outreach to client. [name] did leave phone message for [name] asking her to call [name] at [phone] ? ?Follow Up Plan:  [name] to call client on 01/14/22 at 3:00 PM ? ?Norva Riffle.Ziyan Hillmer MSW, [name] ?Licensed Clinical Social Worker ?[facility] ?[phone]  ?  ?

## 2021-12-02 ENCOUNTER — Telehealth: Payer: Self-pay | Admitting: *Deleted

## 2021-12-02 DIAGNOSIS — Z87891 Personal history of nicotine dependence: Secondary | ICD-10-CM

## 2021-12-02 DIAGNOSIS — Z122 Encounter for screening for malignant neoplasm of respiratory organs: Secondary | ICD-10-CM

## 2021-12-02 NOTE — Telephone Encounter (Signed)
Spoke with pt and scheduled f/u low dose CT for 01/14/22 2:00.  ?

## 2021-12-08 ENCOUNTER — Ambulatory Visit (INDEPENDENT_AMBULATORY_CARE_PROVIDER_SITE_OTHER): Payer: Medicare Other | Admitting: Family Medicine

## 2021-12-08 ENCOUNTER — Encounter: Payer: Self-pay | Admitting: Family Medicine

## 2021-12-08 DIAGNOSIS — J0110 Acute frontal sinusitis, unspecified: Secondary | ICD-10-CM | POA: Diagnosis not present

## 2021-12-08 MED ORDER — BENZONATATE 100 MG PO CAPS: 100.0000 mg | ORAL_CAPSULE | Freq: Three times a day (TID) | ORAL | 0 refills | Status: DC | PRN

## 2021-12-08 MED ORDER — AMOXICILLIN 875 MG PO TABS: 875.0000 mg | ORAL_TABLET | Freq: Two times a day (BID) | ORAL | 0 refills | Status: AC

## 2021-12-08 NOTE — Progress Notes (Signed)
? ?  Virtual Visit  Note ?Due to COVID-19 pandemic this visit was conducted virtually. This visit type was conducted due to national recommendations for restrictions regarding the COVID-19 Pandemic (e.g. social distancing, sheltering in place) in an effort to limit this patient's exposure and mitigate transmission in our community. All issues noted in this document were discussed and addressed.  A physical exam was not performed with this format. ? ?I connected with Donna Rogers on 12/08/21 at 1050 by telephone and verified that I am speaking with the correct person using two identifiers. Donna Rogers is currently located at home and family is currently with her during the visit. The provider, Gwenlyn Perking, FNP is located in their office at time of visit. ? ?I discussed the limitations, risks, security and privacy concerns of performing an evaluation and management service by telephone and the availability of in person appointments. I also discussed with the patient that there may be a patient responsible charge related to this service. The patient expressed understanding and agreed to proceed. ? ?CC:  ? ?History and Present Illness: ? ?HPI ?[name] reports cough and congestion for 1 week. She also reports postnasal drip and throat irritation. She is now having tenderness around her eyes. She has been taking allegra and alka seltzer plus. She denies wheezing, shortness of breath, fever, body aches, or chills. She reports symptoms have not improved despite OTC treatment.  ? ? ? ?ROS ?As per HPI.  ? ?Observations/Objective: ?Alert and oriented x 3. Able to speak in full sentences without difficulty.  ? ? ?Assessment and Plan: ?Donna Rogers was seen today for sinusitis. ? ?Diagnoses and all orders for this visit: ? ?Acute non-recurrent frontal sinusitis ?Amoxicillin as below. Tessalon perles as needed. Discussed symptomatic care and return precautions.  ?-     amoxicillin (AMOXIL) 875 MG tablet; Take 1 tablet (875  mg total) by mouth 2 (two) times daily for 10 days. ?-     benzonatate (TESSALON PERLES) 100 MG capsule; Take 1 capsule (100 mg total) by mouth 3 (three) times daily as needed for cough. ? ? ? ? ?Follow Up Instructions: ?As needed.  ? ?  ?I discussed the assessment and treatment plan with the patient. The patient was provided an opportunity to ask questions and all were answered. The patient agreed with the plan and demonstrated an understanding of the instructions. ?  ?The patient was advised to call back or seek an in-person evaluation if the symptoms worsen or if the condition fails to improve as anticipated. ? ?The above assessment and management plan was discussed with the patient. The patient verbalized understanding of and has agreed to the management plan. Patient is aware to call the clinic if symptoms persist or worsen. Patient is aware when to return to the clinic for a follow-up visit. Patient educated on when it is appropriate to go to the emergency department.  ? ?Time call ended:  1101 ? ?I provided 11 minutes of  non face-to-face time during this encounter. ? ? ? ?Gwenlyn Perking, FNP ? ? ?

## 2021-12-16 ENCOUNTER — Other Ambulatory Visit: Payer: Self-pay | Admitting: Family Medicine

## 2021-12-17 ENCOUNTER — Inpatient Hospital Stay: Admission: RE | Admit: 2021-12-17 | Payer: Self-pay | Source: Ambulatory Visit

## 2021-12-23 DIAGNOSIS — M5416 Radiculopathy, lumbar region: Secondary | ICD-10-CM | POA: Diagnosis not present

## 2021-12-24 ENCOUNTER — Other Ambulatory Visit: Payer: Self-pay | Admitting: Family Medicine

## 2021-12-24 DIAGNOSIS — G629 Polyneuropathy, unspecified: Secondary | ICD-10-CM

## 2022-01-13 ENCOUNTER — Inpatient Hospital Stay: Admission: RE | Admit: 2022-01-13 | Payer: Medicare Other | Source: Ambulatory Visit

## 2022-01-13 ENCOUNTER — Other Ambulatory Visit: Payer: Self-pay | Admitting: Family Medicine

## 2022-01-14 ENCOUNTER — Ambulatory Visit (HOSPITAL_COMMUNITY)
Admission: RE | Admit: 2022-01-14 | Discharge: 2022-01-14 | Disposition: A | Discharge status: Home / Self Care | Payer: Medicare Other | Source: Ambulatory Visit | Attending: Acute Care | Admitting: Acute Care

## 2022-01-14 ENCOUNTER — Telehealth: Payer: Medicare Other

## 2022-01-14 DIAGNOSIS — Z87891 Personal history of nicotine dependence: Secondary | ICD-10-CM | POA: Diagnosis not present

## 2022-01-14 DIAGNOSIS — Z122 Encounter for screening for malignant neoplasm of respiratory organs: Secondary | ICD-10-CM | POA: Insufficient documentation

## 2022-01-16 ENCOUNTER — Other Ambulatory Visit: Payer: Self-pay

## 2022-01-16 ENCOUNTER — Encounter: Payer: Self-pay | Admitting: Family Medicine

## 2022-01-16 DIAGNOSIS — I700 Atherosclerosis of aorta: Secondary | ICD-10-CM | POA: Insufficient documentation

## 2022-01-16 DIAGNOSIS — I2510 Atherosclerotic heart disease of native coronary artery without angina pectoris: Secondary | ICD-10-CM | POA: Insufficient documentation

## 2022-01-16 DIAGNOSIS — Z122 Encounter for screening for malignant neoplasm of respiratory organs: Secondary | ICD-10-CM

## 2022-01-16 DIAGNOSIS — Z87891 Personal history of nicotine dependence: Secondary | ICD-10-CM

## 2022-01-19 ENCOUNTER — Other Ambulatory Visit: Payer: Self-pay | Admitting: Family Medicine

## 2022-01-19 DIAGNOSIS — I2510 Atherosclerotic heart disease of native coronary artery without angina pectoris: Secondary | ICD-10-CM

## 2022-02-03 ENCOUNTER — Telehealth: Payer: Self-pay | Admitting: Family Medicine

## 2022-02-03 ENCOUNTER — Other Ambulatory Visit: Payer: Self-pay | Admitting: Family Medicine

## 2022-02-03 DIAGNOSIS — G629 Polyneuropathy, unspecified: Secondary | ICD-10-CM

## 2022-02-03 NOTE — Telephone Encounter (Signed)
Ok to provide. Due to chronic low back pain/ and chronic pain due to rheumatoid arthritis

## 2022-02-03 NOTE — Telephone Encounter (Signed)
PT AWARE READY DO PICKUP

## 2022-02-12 ENCOUNTER — Other Ambulatory Visit: Payer: Self-pay | Admitting: Family Medicine

## 2022-02-12 DIAGNOSIS — F32A Depression, unspecified: Secondary | ICD-10-CM

## 2022-02-12 DIAGNOSIS — E1142 Type 2 diabetes mellitus with diabetic polyneuropathy: Secondary | ICD-10-CM

## 2022-02-24 ENCOUNTER — Ambulatory Visit: Payer: Medicare Other | Admitting: Family Medicine

## 2022-02-25 ENCOUNTER — Encounter: Payer: Self-pay | Admitting: Family Medicine

## 2022-03-10 ENCOUNTER — Other Ambulatory Visit: Payer: Medicare Other

## 2022-03-10 DIAGNOSIS — Z79899 Other long term (current) drug therapy: Secondary | ICD-10-CM | POA: Diagnosis not present

## 2022-03-10 DIAGNOSIS — M0579 Rheumatoid arthritis with rheumatoid factor of multiple sites without organ or systems involvement: Secondary | ICD-10-CM | POA: Diagnosis not present

## 2022-03-18 ENCOUNTER — Other Ambulatory Visit: Payer: Self-pay | Admitting: Family Medicine

## 2022-03-18 DIAGNOSIS — G629 Polyneuropathy, unspecified: Secondary | ICD-10-CM

## 2022-04-14 ENCOUNTER — Telehealth: Payer: Self-pay | Admitting: Family Medicine

## 2022-04-14 NOTE — Telephone Encounter (Signed)
Dr. Lajuana Ripple had a cancellation on 9/18. Pt is able to attend. Canceled 10/2 appt. Informed pt that hydrocodone cannot be filled outside of an OV. Advised pt to make sure she make her follow ups at check out before she leaves.

## 2022-04-14 NOTE — Telephone Encounter (Signed)
Pt is scheduled to see Dr Lajuana Ripple on 05/18/22 for her follow up visit because that was first available when she scheduled.  Wants to know if Dr Lajuana Ripple will see her sooner because she is out of her hydrocodone Rx and needs refill but also needs a change in dosage because what she currently takes isnt working for her anymore.  Please advise and call patient.

## 2022-04-16 ENCOUNTER — Ambulatory Visit (INDEPENDENT_AMBULATORY_CARE_PROVIDER_SITE_OTHER): Payer: Medicare Other

## 2022-04-16 DIAGNOSIS — Z0000 Encounter for general adult medical examination without abnormal findings: Secondary | ICD-10-CM | POA: Diagnosis not present

## 2022-04-16 NOTE — Progress Notes (Cosign Needed)
MEDICARE ANNUAL WELLNESS VISIT  04/16/2022  Telephone Visit Disclaimer This Medicare AWV was conducted by telephone due to national recommendations for restrictions regarding the COVID-19 Pandemic (e.g. social distancing).  I verified, using two identifiers, that I am speaking with Donna Rogers or their authorized healthcare agent. I discussed the limitations, risks, security, and privacy concerns of performing an evaluation and management service by telephone and the potential availability of an in-person appointment in the future. The patient expressed understanding and agreed to proceed.  Location of Patient: Home  Location of Provider (nurse):  [facility]  Subjective:    Donna Rogers is a 71 y.o. female patient of Donna Norlander, DO who had a Medicare Annual Wellness Visit today via telephone. Donna Rogers is a very pleasant lady who is divorced and lives alone. She states that she has a best friend that helps with cooking meals and fixes her medications. She has a son that lives close by and a grandchild. She is retired and enjoys painting which she states she can't do any more and also putting puzzles together but states she doesn't do much of that any more either. She states that she is eating around 1 meal a day because she is just not hungry. She struggles with cleaning house because it is difficult for her to stand so long.   Patient Care Team: Donna Norlander, DO as PCP - General (Family Medicine) Shea Evans Norva Riffle, [name] as Social Worker (Licensed Clinical Social Worker) Blanca Friend, Royce Macadamia, [facility] as Pharmacist (Family Medicine) Gatha Mayer, MD as Consulting Physician (Gastroenterology) [facility] (Specialist)     04/16/2022    3:07 PM 04/15/2021    4:09 PM 10/31/2019    2:32 PM 08/29/2019   10:20 AM 07/27/2018   12:17 PM 03/03/2016    7:27 AM 02/03/2016    3:17 PM  Advanced Directives  Does Patient Have a Medical Advance Directive?  No No No No No No No  Would patient like information on creating a medical advance directive? No - Patient declined Yes (MAU/Ambulatory/Procedural Areas - Information given) No - Patient declined   No - patient declined information Yes - Educational materials given    Hospital Utilization Over the Past 12 Months: # of hospitalizations or ER visits: 0 # of surgeries: 0  Review of Systems    Patient reports that her overall health is worse compared to last year.  History obtained from chart review  Patient Reported Readings (BP, Pulse, CBG, Weight, etc) none  Pain Assessment Pain : 0-10 Pain Score: 10-Worst pain ever Pain Type: Chronic pain Pain Location: Back Pain Orientation: Lower Pain Descriptors / Indicators: Constant Pain Onset: More than a month ago Pain Frequency: Constant     Current Medications & Allergies (verified) Allergies as of 04/16/2022       Reactions   Ciprofloxacin Hcl Rash   Enbrel [etanercept] Rash   Metformin And Related Diarrhea        Medication List        Accurate as of April 16, 2022  3:17 PM. If you have any questions, ask your nurse or doctor.          Alpha-Lipoic Acid 600 MG Caps TAKE (1) CAPSULE DAILY FOR NERVE PAIN   amLODipine 5 MG tablet Commonly known as: NORVASC TAKE ONE TABLET ONCE DAILY   aspirin 81 MG tablet Take 81 mg by mouth daily.   atenolol 25 MG tablet Commonly known as:  TENORMIN TAKE 1 TABLET BY MOUTH DAILY.   azelastine 0.05 % ophthalmic solution Commonly known as: OPTIVAR in the morning and at bedtime.   Belsomra 10 MG Tabs Generic drug: Suvorexant Take 10 mg by mouth at bedtime as needed.   benzonatate 100 MG capsule Commonly known as: Tessalon Perles Take 1 capsule (100 mg total) by mouth 3 (three) times daily as needed for cough.   budesonide 3 MG 24 hr capsule Commonly known as: ENTOCORT EC Take 3 capsules (9 mg total) by mouth daily.   buPROPion 300 MG 24 hr tablet Commonly known as:  WELLBUTRIN XL TAKE ONE TABLET ONCE DAILY   busPIRone 10 MG tablet Commonly known as: BUSPAR TAKE (1) TABLET TWICE A DAY.   cholecalciferol 1000 units tablet Commonly known as: VITAMIN D Take 1,000 Units by mouth daily.   citalopram 40 MG tablet Commonly known as: CELEXA TAKE ONE TABLET ONCE DAILY (NEEDS TO BE SEEN BEFORE NEXT REFILL)   diclofenac 75 MG EC tablet Commonly known as: VOLTAREN TAKE ONE TABLET BY MOUTH TWICE DAILY AS NEEDED FOR MODERATE PAIN (USE SPARINGLY)   diphenoxylate-atropine 2.5-0.025 MG tablet Commonly known as: LOMOTIL TAKE 1 OR 2 TABLETS FOUR TIMES DAILY AS NEEDED   empagliflozin 25 MG Tabs tablet Commonly known as: JARDIANCE Take 25 mg by mouth daily.   folic acid 1 MG tablet Commonly known as: FOLVITE Take 1 mg by mouth daily.   gabapentin 600 MG tablet Commonly known as: NEURONTIN Take 2 tablets (1,200 mg total) by mouth 3 (three) times daily. (NEEDS TO BE SEEN BEFORE NEXT REFILL)   glipiZIDE 5 MG 24 hr tablet Commonly known as: GLUCOTROL XL Take 1 tablet (5 mg total) by mouth 2 (two) times daily with a meal.   HYDROcodone-acetaminophen 5-325 MG tablet Commonly known as: Norco Take 1 tablet by mouth every 12 (twelve) hours as needed for severe pain.   HYDROcodone-acetaminophen 5-325 MG tablet Commonly known as: Norco Take 1 tablet by mouth every 12 (twelve) hours as needed for severe pain.   losartan 100 MG tablet Commonly known as: COZAAR TAKE 1 TABLET DAILY   methotrexate 2.5 MG tablet Commonly known as: RHEUMATREX Take 15 mg by mouth once a week.   omeprazole 40 MG capsule Commonly known as: PRILOSEC Take 1 capsule (40 mg total) by mouth daily.   Oyster Shell Calcium 500 MG Tabs 1 tablet with meals   Rybelsus 14 MG Tabs Generic drug: Semaglutide Take 14 mg by mouth daily.   simvastatin 40 MG tablet Commonly known as: ZOCOR TAKE 1 TABLET DAILY   SULFASALAZINE PO Take by mouth.   VITAMIN B 12 PO Take by mouth.    VITAMIN B-6 PO Take by mouth.   Vitamin C 500 MG Chew 1 tablet        History (reviewed): Past Medical History:  Diagnosis Date   Allergy    seasonal allergies   Anemia    hx of   Arthritis    rheumatoid   Depression    on meds   Diabetes (HCC)    Diabetic peripheral neuropathy (HCC)    GERD (gastroesophageal reflux disease)    on meds   Headache    Hyperlipidemia    on meds   Hyperplastic rectal polyp 10/21/2015   Hypertension    pt. denies   Lymphocytic colitis    Neuropathy    Rheumatoid arthritis ([place])    Shortness of breath dyspnea    with exertion   Past Surgical  History:  Procedure Laterality Date   BREAST LUMPECTOMY Right 2018   BREAST LUMPECTOMY WITH RADIOACTIVE SEED LOCALIZATION Left 03/03/2016   Procedure: BREAST LUMPECTOMY WITH RADIOACTIVE SEED LOCALIZATION;  Surgeon: Coralie Keens, MD;  Location: [facility];  Service: General;  Laterality: Left;   COLONOSCOPY  08/2002   RMR: internal hemorrhoids   COLONOSCOPY N/A 10/21/2015   Procedure: COLONOSCOPY;  Surgeon: Daneil Dolin, MD;  [facility];  Service: Endoscopy;  Laterality: N/A;  250 - moved to 2:35 - office to notify pt   CYST REMOVAL TRUNK     ESOPHAGOGASTRODUODENOSCOPY  08/2002   RMR: nonerosive reflux esophagitis, hiatal hernia   TONSILLECTOMY     WISDOM TOOTH EXTRACTION     Family History  Problem Relation Age of Onset   Alzheimer's disease Mother    Lung cancer Mother 99   Diabetes Father    Heart disease Father    Arthritis Sister    Diabetes Sister    Diabetes Brother    Hypertension Brother    Colon cancer Neg Hx    Stomach cancer Neg Hx    Colon polyps Neg Hx    Esophageal cancer Neg Hx    Rectal cancer Neg Hx    Social History   Socioeconomic History   Marital status: Significant Other    Spouse name: Not on file   Number of children: 1   Years of education: 11th   Highest education level: 11th grade  Occupational History   Occupation: retired  Tobacco Use    Smoking status: Former    Packs/day: 1.00    Years: 46.00    Total pack years: 46.00    Types: Cigarettes   Smokeless tobacco: Never  Vaping Use   Vaping Use: Some days  Substance and Sexual Activity   Alcohol use: No    Alcohol/week: 0.0 standard drinks of alcohol   Drug use: No   Sexual activity: Not Currently  Other Topics Concern   Not on file  Social History Narrative   The patient is divorced she has 1 son who is married that she has a grandchild.   She does not use alcohol or drugs she drinks 3-4 caffeinated beverages daily   She is a smoker   She lives with her boyfriend   Social Determinants of Health   Financial Resource Strain: Medium Risk (04/15/2021)   Overall Financial Resource Strain (CARDIA)    Difficulty of Paying Living Expenses: Somewhat hard  Food Insecurity: No Food Insecurity (04/15/2021)   Hunger Vital Sign    Worried About Running Out of Food in the Last Year: Never true    [place] in the Last Year: Never true  Transportation Needs: No Transportation Needs (04/15/2021)   PRAPARE - Hydrologist (Medical): No    Lack of Transportation (Non-Medical): No  Physical Activity: Inactive (07/24/2021)   Exercise Vital Sign    Days of Exercise per Week: 0 days    Minutes of Exercise per Session: 0 min  Stress: Stress Concern Present (07/24/2021)   [place]    Feeling of Stress : To some extent  Social Connections: Moderately Isolated (04/15/2021)   Social Connection and Isolation Panel [NHANES]    Frequency of Communication with Friends and Family: More than three times a week    Frequency of Social Gatherings with Friends and Family: More than three times a week  Attends Religious Services: Never    Active Member of Clubs or Organizations: No    Attends Archivist Meetings: Never    Marital Status: Living with partner    Activities of Daily  Living    04/16/2022    3:08 PM  In your present state of health, do you have any difficulty performing the following activities:  Hearing? 0  Vision? 0  Difficulty concentrating or making decisions? 0  Walking or climbing stairs? 1  Dressing or bathing? 1  Doing errands, shopping? 1  Preparing Food and eating ? Y  Using the Toilet? N  In the past six months, have you accidently leaked urine? N  Do you have problems with loss of bowel control? N  Managing your Medications? Y  Managing your Finances? N  Housekeeping or managing your Housekeeping? N    Patient Education/ Literacy How often do you need to have someone help you when you read instructions, pamphlets, or other written materials from your doctor or pharmacy?: 1 - Never What is the last grade level you completed in school?: 11th grade  Exercise Current Exercise Habits: The patient does not participate in regular exercise at present, Exercise limited by: orthopedic condition(s)  Diet Patient reports consuming 1 meals a day and 1 snack(s) a day Patient reports that her primary diet is: Diabetic Patient reports that she does have regular access to food.   Depression Screen    11/19/2021    2:36 PM 09/16/2021   10:48 AM 07/24/2021    9:46 AM 04/04/2021   11:32 AM 04/01/2021   12:59 PM 04/01/2021   12:15 PM 01/08/2021   10:52 AM  PHQ 2/9 Scores  PHQ - 2 Score '4 5 2 2 3 3 4  '$ PHQ- 9 Score '17 17 8 12 11 11 13     '$ Fall Risk    04/16/2022    3:08 PM 11/19/2021    2:35 PM 09/16/2021   10:48 AM 08/13/2021   11:58 AM 04/15/2021    4:08 PM  Fall Risk   Falls in the past year? '1 1 1 1 1  '$ Number falls in past yr: '1 1 1 1 1  '$ Injury with Fall?  0 '1 1 1  '$ Comment     possibly hurt shoulder  Risk for fall due to :  History of fall(s) History of fall(s) History of fall(s) History of fall(s);Impaired balance/gait;Impaired vision;Medication side effect;Orthopedic patient  Follow up  Education provided Education provided Falls  evaluation completed Education provided;Falls prevention discussed     Objective:  JENESYS CASSEUS seemed alert and oriented and she participated appropriately during our telephone visit.  Blood Pressure Weight BMI  BP Readings from Last 3 Encounters:  11/19/21 128/71  09/16/21 122/72  08/29/21 124/79   Wt Readings from Last 3 Encounters:  11/19/21 198 lb 9.6 oz (90.1 kg)  09/16/21 188 lb 9.6 oz (85.5 kg)  08/29/21 185 lb 3.2 oz (84 kg)   BMI Readings from Last 1 Encounters:  11/19/21 37.53 kg/m    *Unable to obtain current vital signs, weight, and BMI due to telephone visit type  Hearing/Vision  Rowen did not seem to have difficulty with hearing/understanding during the telephone conversation Reports that she has had a formal eye exam by an eye care professional within the past year Reports that she has not had a formal hearing evaluation within the past year *Unable to fully assess hearing and vision during telephone visit type  Cognitive Function:  04/16/2022    3:09 PM 04/15/2021    4:11 PM 10/31/2019    2:39 PM  6CIT Screen  What Year? 0 points 0 points 0 points  What month? 0 points 0 points 0 points  What time? 0 points 0 points 0 points  Count back from 20 0 points 0 points 0 points  Months in reverse 0 points 4 points 0 points  Repeat phrase 0 points 0 points 0 points  Total Score 0 points 4 points 0 points   (Normal:0-7, Significant for Dysfunction: >8)  Normal Cognitive Function Screening: Yes   Immunization & Health Maintenance Record Immunization History  Administered Date(s) Administered   Fluad Quad(high Dose 65+) 05/03/2019   Influenza,inj,Quad PF,6+ Mos 05/25/2013, 07/31/2015, 06/18/2016, 05/16/2018   Influenza-Unspecified 05/07/2019, 07/24/2020, 05/08/2021   PPD Test 05/08/2013   Pneumococcal Conjugate-13 05/16/2018   Pneumococcal Polysaccharide-23 07/03/2019   Tdap 12/28/2018   Zoster Recombinat (Shingrix) 06/06/2019, 06/30/2019,  10/27/2019    Health Maintenance  Topic Date Due   MAMMOGRAM  12/19/2017   INFLUENZA VACCINE  03/17/2022   OPHTHALMOLOGY EXAM  04/04/2022   COVID-19 Vaccine (1) 05/02/2022 (Originally 02/23/1951)   HEMOGLOBIN A1C  05/21/2022   FOOT EXAM  11/20/2022   COLONOSCOPY (Pts 45-64yr Insurance coverage will need to be confirmed)  10/20/2025   TETANUS/TDAP  12/27/2028   Pneumonia Vaccine 71 Years old  Completed   DEXA SCAN  Completed   Hepatitis C Screening  Completed   Zoster Vaccines- Shingrix  Completed   HPV VACCINES  Aged Out       Assessment  This is a routine wellness examination for [employer]  Health Maintenance: Due or Overdue Health Maintenance Due  Topic Date Due   MAMMOGRAM  12/19/2017   INFLUENZA VACCINE  03/17/2022   OPHTHALMOLOGY EXAM  04/04/2022    PKem Boroughsdoes not need a referral for Community Assistance: Care Management:   no Social Work:    no Prescription Assistance:  no Nutrition/Diabetes Education:  no   Plan:  Personalized Goals  Goals Addressed   None    Personalized Health Maintenance & Screening Recommendations  Influenza vaccine Mailed information on Advanced Directives  Lung Cancer Screening Recommended: no (Low Dose CT Chest recommended if Age 69-80years, 30 pack-year currently smoking OR have quit w/in past 15 years) Hepatitis C Screening recommended: Done HIV Screening recommended: no  Advanced Directives: Written information was not prepared per patient's request.  Referrals & Orders No orders of the defined types were placed in this encounter.   Follow-up Plan Follow-up with GJanora Norlander DO as planned Schedule for seasonal flu vaccine    I have personally reviewed and noted the following in the patient's chart:   Medical and social history Use of alcohol, tobacco or illicit drugs  Current medications and supplements Functional ability and status Nutritional status Physical activity Advanced  directives List of other physicians Hospitalizations, surgeries, and ER visits in previous 12 months Vitals Screenings to include cognitive, depression, and falls Referrals and appointments  In addition, I have reviewed and discussed with PKem Boroughscertain preventive protocols, quality metrics, and best practice recommendations. A written personalized care plan for preventive services as well as general preventive health recommendations is available and can be mailed to the patient at her request.      ARolena InfanteLPN [id number]

## 2022-04-17 NOTE — Progress Notes (Signed)
I have reviewed the AWV documentation and agree with the written assessment and plan of care.  Moesha Sarchet, FNP-C [facility]  

## 2022-04-30 ENCOUNTER — Other Ambulatory Visit: Payer: Self-pay | Admitting: Family Medicine

## 2022-04-30 DIAGNOSIS — I10 Essential (primary) hypertension: Secondary | ICD-10-CM

## 2022-05-04 ENCOUNTER — Other Ambulatory Visit: Payer: Self-pay | Admitting: Family Medicine

## 2022-05-04 ENCOUNTER — Ambulatory Visit (INDEPENDENT_AMBULATORY_CARE_PROVIDER_SITE_OTHER): Payer: Medicare Other | Admitting: Family Medicine

## 2022-05-04 ENCOUNTER — Encounter: Payer: Self-pay | Admitting: Family Medicine

## 2022-05-04 VITALS — BP 117/76 | HR 83 | Temp 97.8°F | Ht 61.0 in | Wt 202.8 lb

## 2022-05-04 DIAGNOSIS — E785 Hyperlipidemia, unspecified: Secondary | ICD-10-CM | POA: Diagnosis not present

## 2022-05-04 DIAGNOSIS — E1169 Type 2 diabetes mellitus with other specified complication: Secondary | ICD-10-CM | POA: Diagnosis not present

## 2022-05-04 DIAGNOSIS — M5136 Other intervertebral disc degeneration, lumbar region: Secondary | ICD-10-CM

## 2022-05-04 DIAGNOSIS — I152 Hypertension secondary to endocrine disorders: Secondary | ICD-10-CM

## 2022-05-04 DIAGNOSIS — Z23 Encounter for immunization: Secondary | ICD-10-CM

## 2022-05-04 DIAGNOSIS — E1142 Type 2 diabetes mellitus with diabetic polyneuropathy: Secondary | ICD-10-CM

## 2022-05-04 DIAGNOSIS — G629 Polyneuropathy, unspecified: Secondary | ICD-10-CM

## 2022-05-04 DIAGNOSIS — E1159 Type 2 diabetes mellitus with other circulatory complications: Secondary | ICD-10-CM

## 2022-05-04 DIAGNOSIS — M0579 Rheumatoid arthritis with rheumatoid factor of multiple sites without organ or systems involvement: Secondary | ICD-10-CM | POA: Diagnosis not present

## 2022-05-04 MED ORDER — TIZANIDINE HCL 4 MG PO TABS: 2.0000 mg | ORAL_TABLET | Freq: Three times a day (TID) | ORAL | 0 refills | Status: AC | PRN

## 2022-05-04 NOTE — Progress Notes (Signed)
Subjective: CC:DM PCP: [name], DO [id number] A Ballowe is a 71 y.o. female presenting to clinic today for:  1. Type 2 Diabetes with hypertension, hyperlipidemia:  With medications.  No vaginitis reported.  No chest pain or shortness of breath reported She is really been working on cutting back on carbs and her blood sugars are running in the low 100s typically.  Last eye exam: Up-to-date Last foot exam: Up-to-date Last A1c:  Lab Results  Component Value Date   HGBA1C 7.3 (H) 11/19/2021   Nephropathy screen indicated?:  Up-to-date Last flu, zoster and/or pneumovax:  Immunization History  Administered Date(s) Administered   Fluad Quad(high Dose 65+) 05/03/2019, 05/04/2022   Influenza,inj,Quad PF,6+ Mos 05/25/2013, 07/31/2015, 06/18/2016, 05/16/2018   Influenza-Unspecified 05/07/2019, 07/24/2020, 05/08/2021   PPD Test 05/08/2013   Pneumococcal Conjugate-13 05/16/2018   Pneumococcal Polysaccharide-23 07/03/2019   Tdap 12/28/2018   Zoster Recombinat (Shingrix) 06/06/2019, 06/30/2019, 10/27/2019    2.  DDD lumbar spine, RA of multiple joints Patient continues to see Leafy Kindle intermittently for RA.  She is treated with methotrexate and in June Michelle increased the dose of her Norco to every 6 hours.  She notes that symptoms are refractory to this.  She has not yet reached out to [facility] or Dr. Nelva Bush, who has done injection therapies in the past.    ROS: Per HPI  Allergies  Allergen Reactions   Ciprofloxacin Hcl Rash   Enbrel [Etanercept] Rash   Metformin And Related Diarrhea   Past Medical History:  Diagnosis Date   Allergy    seasonal allergies   Anemia    hx of   Arthritis    rheumatoid   Depression    on meds   Diabetes ([place])    Diabetic peripheral neuropathy ([place])    GERD (gastroesophageal reflux disease)    on meds   Headache    Hyperlipidemia    on meds   Hyperplastic rectal polyp 10/21/2015   Hypertension    pt. denies    Lymphocytic colitis    Neuropathy    Rheumatoid arthritis (HCC)    Shortness of breath dyspnea    with exertion    Current Outpatient Medications:    Alpha-Lipoic Acid 600 MG CAPS, TAKE (1) CAPSULE DAILY FOR NERVE PAIN, Disp: 90 capsule, Rfl: 0   amLODipine (NORVASC) 5 MG tablet, TAKE ONE TABLET ONCE DAILY, Disp: 30 tablet, Rfl: 2   Ascorbic Acid (VITAMIN C) 500 MG CHEW, 1 tablet, Disp: , Rfl:    aspirin 81 MG tablet, Take 81 mg by mouth daily., Disp: , Rfl:    atenolol (TENORMIN) 25 MG tablet, TAKE 1 TABLET BY MOUTH DAILY., Disp: 90 tablet, Rfl: 1   azelastine (OPTIVAR) 0.05 % ophthalmic solution, in the morning and at bedtime., Disp: , Rfl:    benzonatate (TESSALON PERLES) 100 MG capsule, Take 1 capsule (100 mg total) by mouth 3 (three) times daily as needed for cough., Disp: 20 capsule, Rfl: 0   budesonide (ENTOCORT EC) 3 MG 24 hr capsule, Take 3 capsules (9 mg total) by mouth daily., Disp: 270 capsule, Rfl: 0   buPROPion (WELLBUTRIN XL) 300 MG 24 hr tablet, TAKE ONE TABLET ONCE DAILY, Disp: 30 tablet, Rfl: 2   busPIRone (BUSPAR) 10 MG tablet, TAKE (1) TABLET TWICE A DAY., Disp: 60 tablet, Rfl: 1   cholecalciferol (VITAMIN D) 1000 UNITS tablet, Take 1,000 Units by mouth daily., Disp: , Rfl:    citalopram (CELEXA) 40 MG tablet, TAKE ONE TABLET  ONCE DAILY (NEEDS TO BE SEEN BEFORE NEXT REFILL), Disp: 30 tablet, Rfl: 0   Cyanocobalamin (VITAMIN B 12 PO), Take by mouth., Disp: , Rfl:    diclofenac (VOLTAREN) 75 MG EC tablet, TAKE ONE TABLET BY MOUTH TWICE DAILY AS NEEDED FOR MODERATE PAIN (USE SPARINGLY), Disp: 30 tablet, Rfl: 0   diphenoxylate-atropine (LOMOTIL) 2.5-0.025 MG tablet, TAKE 1 OR 2 TABLETS FOUR TIMES DAILY AS NEEDED, Disp: 90 tablet, Rfl: 1   empagliflozin (JARDIANCE) 25 MG TABS tablet, Take 25 mg by mouth daily., Disp: , Rfl:    folic acid (FOLVITE) 1 MG tablet, Take 1 mg by mouth daily., Disp: , Rfl:    gabapentin (NEURONTIN) 600 MG tablet, Take 2 tablets (1,200 mg total) by  mouth 3 (three) times daily. (NEEDS TO BE SEEN BEFORE NEXT REFILL), Disp: 180 tablet, Rfl: 0   glipiZIDE (GLUCOTROL XL) 5 MG 24 hr tablet, Take 1 tablet (5 mg total) by mouth 2 (two) times daily with a meal., Disp: 180 tablet, Rfl: 2   HYDROcodone-acetaminophen (NORCO) 5-325 MG tablet, Take 1 tablet by mouth every 12 (twelve) hours as needed for severe pain., Disp: 60 tablet, Rfl: 0   HYDROcodone-acetaminophen (NORCO) 5-325 MG tablet, Take 1 tablet by mouth every 12 (twelve) hours as needed for severe pain., Disp: 60 tablet, Rfl: 0   losartan (COZAAR) 100 MG tablet, TAKE 1 TABLET DAILY, Disp: 90 tablet, Rfl: 1   methotrexate (RHEUMATREX) 2.5 MG tablet, Take 15 mg by mouth once a week., Disp: , Rfl:    omeprazole (PRILOSEC) 40 MG capsule, Take 1 capsule (40 mg total) by mouth daily., Disp: 90 capsule, Rfl: 3   Oyster Shell Calcium 500 MG TABS, 1 tablet with meals, Disp: , Rfl:    Pyridoxine HCl (VITAMIN B-6 PO), Take by mouth., Disp: , Rfl:    Semaglutide (RYBELSUS) 14 MG TABS, Take 14 mg by mouth daily., Disp: 30 tablet, Rfl: 0   simvastatin (ZOCOR) 40 MG tablet, TAKE 1 TABLET DAILY, Disp: 90 tablet, Rfl: 1   SULFASALAZINE PO, Take by mouth., Disp: , Rfl:    Suvorexant (BELSOMRA) 10 MG TABS, Take 10 mg by mouth at bedtime as needed., Disp: 30 tablet, Rfl: 0 Social History   Socioeconomic History   Marital status: Significant Other    Spouse name: Not on file   Number of children: 1   Years of education: 11th   Highest education level: 11th grade  Occupational History   Occupation: retired  Tobacco Use   Smoking status: Former    Packs/day: 1.00    Years: 46.00    Total pack years: 46.00    Types: Cigarettes   Smokeless tobacco: Never  Vaping Use   Vaping Use: Some days  Substance and Sexual Activity   Alcohol use: No    Alcohol/week: 0.0 standard drinks of alcohol   Drug use: No   Sexual activity: Not Currently  Other Topics Concern   Not on file  Social History Narrative    The patient is divorced she has 1 son who is married that she has a grandchild.   She does not use alcohol or drugs she drinks 3-4 caffeinated beverages daily   She is a smoker   She lives with her boyfriend   Social Determinants of Health   Financial Resource Strain: Medium Risk (04/15/2021)   Overall Financial Resource Strain (CARDIA)    Difficulty of Paying Living Expenses: Somewhat hard  Food Insecurity: No Food Insecurity (04/15/2021)   Hunger  Vital Sign    Worried About Charity fundraiser in the Last Year: Never true    Ran Out of Food in the Last Year: Never true  Transportation Needs: No Transportation Needs (04/15/2021)   PRAPARE - Hydrologist (Medical): No    Lack of Transportation (Non-Medical): No  Physical Activity: Inactive (07/24/2021)   Exercise Vital Sign    Days of Exercise per Week: 0 days    Minutes of Exercise per Session: 0 min  Stress: Stress Concern Present (07/24/2021)   [place]    Feeling of Stress : To some extent  Social Connections: Moderately Isolated (04/15/2021)   Social Connection and Isolation Panel [NHANES]    Frequency of Communication with Friends and Family: More than three times a week    Frequency of Social Gatherings with Friends and Family: More than three times a week    Attends Religious Services: Never    Marine scientist or Organizations: No    Attends Archivist Meetings: Never    Marital Status: Living with partner  Intimate Partner Violence: Not At Risk (04/15/2021)   Humiliation, Afraid, Rape, and Kick questionnaire    Fear of Current or Ex-Partner: No    Emotionally Abused: No    Physically Abused: No    Sexually Abused: No   Family History  Problem Relation Age of Onset   Alzheimer's disease Mother    Lung cancer Mother 53   Diabetes Father    Heart disease Father    Arthritis Sister    Diabetes Sister     Diabetes Brother    Hypertension Brother    Colon cancer Neg Hx    Stomach cancer Neg Hx    Colon polyps Neg Hx    Esophageal cancer Neg Hx    Rectal cancer Neg Hx     Objective: Office vital signs reviewed. BP 117/76   Pulse 83   Temp 97.8 F (36.6 C)   Ht 5' 1"  (1.549 m)   Wt 202 lb 12.8 oz (92 kg)   SpO2 93%   BMI 38.32 kg/m   Physical Examination:  General: Awake, alert, morbidly obese, uncomfortable-appearing HEENT: Sclera white.  Moist mucous membranes Cardio: regular rate and rhythm, S1S2 heard, no murmurs appreciated Pulm: clear to auscultation bilaterally, no wheezes, rhonchi or rales; normal work of breathing on room air MSK: Uncomfortable appearing.  Very antalgic gait and station.  Scoliotic curve appreciated in the thoracolumbar area  Assessment/ Plan: 71 y.o. female   Type 2 diabetes mellitus with other specified complication, without long-term current use of insulin ([place]) - Plan: Bayer DCA Hb A1c Waived, Microalbumin / creatinine urine ratio  Hypertension associated with diabetes ([place])  Diabetic polyneuropathy associated with type 2 diabetes mellitus ([place]) - Plan: Ambulatory referral to Pain Clinic  Hyperlipidemia associated with type 2 diabetes mellitus ([place])  Degeneration of lumbar intervertebral disc - Plan: tiZANidine (ZANAFLEX) 4 MG tablet, Ambulatory referral to Pain Clinic  Rheumatoid arthritis involving multiple sites with positive rheumatoid factor ([place]) - Plan: Ambulatory referral to Pain Clinic, CMP14+EGFR, CBC  Need for immunization against influenza - Plan: Flu Vaccine QUAD High Dose(Fluad)  Sugar is well controlled.  No changes.  Check urine microalbumin  Blood pressure well controlled.  No changes  Continues to have quite a bit of diabetic neuropathy.  I would urged her to utilize her third 1200 mg dose.  We will place  referral to pain clinic and include this as potential area of treatment as well  Continue statin.  Not yet due for  lipid panel  I have added a muscle relaxer in efforts to alleviate some of the discomfort she is experiencing.  She is currently refractory to Norco.  Does not want to utilize steroids given diabetes and do not think that she is a good candidate for NSAIDs given known CAD.  Referral placed for addressing both degenerative changes in the lumbar disc as well as uncontrolled pain from the rheumatoid arthritis standpoint.  Symptoms of the lumbar spine are uncontrolled even after injection therapy with Dr. Nelva Bush.  I will CC her specialist as FYI  Flu shot administered  Orders Placed This Encounter  Procedures   Bayer DCA Hb A1c Waived   Microalbumin / creatinine urine ratio   No orders of the defined types were placed in this encounter.    [name], DO [place] [phone]

## 2022-05-05 LAB — MICROALBUMIN / CREATININE URINE RATIO
Creatinine, Urine: 156.8 mg/dL
Microalb/Creat Ratio: 6 mg/g creat (ref 0–29)
Microalbumin, Urine: 8.9 ug/mL

## 2022-05-05 LAB — CMP14+EGFR
ALT: 30 IU/L (ref 0–32)
AST: 27 IU/L (ref 0–40)
Albumin/Globulin Ratio: 1.7 (ref 1.2–2.2)
Albumin: 4.6 g/dL (ref 3.9–4.9)
Alkaline Phosphatase: 67 IU/L (ref 44–121)
BUN/Creatinine Ratio: 10 — ABNORMAL LOW (ref 12–28)
BUN: 10 mg/dL (ref 8–27)
Bilirubin Total: 0.4 mg/dL (ref 0.0–1.2)
CO2: 28 mmol/L (ref 20–29)
Calcium: 9.6 mg/dL (ref 8.7–10.3)
Chloride: 101 mmol/L (ref 96–106)
Creatinine, Ser: 1.02 mg/dL — ABNORMAL HIGH (ref 0.57–1.00)
Globulin, Total: 2.7 g/dL (ref 1.5–4.5)
Glucose: 111 mg/dL — ABNORMAL HIGH (ref 70–99)
Potassium: 4.1 mmol/L (ref 3.5–5.2)
Sodium: 142 mmol/L (ref 134–144)
Total Protein: 7.3 g/dL (ref 6.0–8.5)
eGFR: 60 mL/min/{1.73_m2} (ref >59)

## 2022-05-05 LAB — BAYER DCA HB A1C WAIVED: HB A1C (BAYER DCA - WAIVED): 6.8 % — ABNORMAL HIGH (ref 4.8–5.6)

## 2022-05-05 LAB — CBC
Hematocrit: 41 % (ref 34.0–46.6)
Hemoglobin: 13.8 g/dL (ref 11.1–15.9)
MCH: 30.5 pg (ref 26.6–33.0)
MCHC: 33.7 g/dL (ref 31.5–35.7)
MCV: 91 fL (ref 79–97)
Platelets: 286 10*3/uL (ref 150–450)
RBC: 4.53 x10E6/uL (ref 3.77–5.28)
RDW: 12.8 % (ref 11.7–15.4)
WBC: 8.5 10*3/uL (ref 3.4–10.8)

## 2022-05-13 ENCOUNTER — Other Ambulatory Visit: Payer: Self-pay | Admitting: Family Medicine

## 2022-05-13 DIAGNOSIS — E1142 Type 2 diabetes mellitus with diabetic polyneuropathy: Secondary | ICD-10-CM

## 2022-05-15 ENCOUNTER — Encounter: Payer: Self-pay | Admitting: Physical Medicine & Rehabilitation

## 2022-05-18 ENCOUNTER — Ambulatory Visit: Payer: Medicare Other | Admitting: Family Medicine

## 2022-06-05 ENCOUNTER — Other Ambulatory Visit: Payer: Self-pay | Admitting: Family Medicine

## 2022-06-05 ENCOUNTER — Encounter: Payer: Self-pay | Admitting: Physical Medicine & Rehabilitation

## 2022-06-05 ENCOUNTER — Encounter: Payer: Medicare Other | Attending: Physical Medicine & Rehabilitation | Admitting: Physical Medicine & Rehabilitation

## 2022-06-05 VITALS — BP 119/76 | HR 70 | Ht 61.0 in | Wt 202.4 lb

## 2022-06-05 DIAGNOSIS — M545 Low back pain, unspecified: Secondary | ICD-10-CM | POA: Diagnosis not present

## 2022-06-05 DIAGNOSIS — Z5181 Encounter for therapeutic drug level monitoring: Secondary | ICD-10-CM | POA: Diagnosis not present

## 2022-06-05 DIAGNOSIS — G8929 Other chronic pain: Secondary | ICD-10-CM | POA: Diagnosis not present

## 2022-06-05 DIAGNOSIS — E1142 Type 2 diabetes mellitus with diabetic polyneuropathy: Secondary | ICD-10-CM | POA: Diagnosis not present

## 2022-06-05 DIAGNOSIS — M069 Rheumatoid arthritis, unspecified: Secondary | ICD-10-CM | POA: Diagnosis not present

## 2022-06-05 DIAGNOSIS — G894 Chronic pain syndrome: Secondary | ICD-10-CM

## 2022-06-05 DIAGNOSIS — M25511 Pain in right shoulder: Secondary | ICD-10-CM

## 2022-06-05 DIAGNOSIS — Z79891 Long term (current) use of opiate analgesic: Secondary | ICD-10-CM | POA: Diagnosis not present

## 2022-06-05 MED ORDER — DICLOFENAC SODIUM 1 % EX GEL: 2.0000 g | Freq: Four times a day (QID) | CUTANEOUS | Status: DC

## 2022-06-05 MED ORDER — PREGABALIN 150 MG PO CAPS: 150.0000 mg | ORAL_CAPSULE | Freq: Two times a day (BID) | ORAL | 2 refills | Status: DC

## 2022-06-05 NOTE — Progress Notes (Addendum)
Subjective:    Patient ID: Donna Rogers, female    DOB: 07/25/1951, 71 y.o.   MRN: [id number]  HPI Donna Rogers is a 71 yo female with PMH of rheumatoid arthritis, chronic low back pain, diabetes mellitus type 2 with polyneuropathy, GERD, carpal tunnel right wrist, depression who is here for chronic pain.  Patient reports that she has been having burning and freezing pain in her feet that has been worsening over the past few years.  She says she feels like her feet are on ice.  Pain is worse on the dorsal and plantar aspects of her feet however she also has this pain over her ankles.  She was seen in the past by Dr. Nelva Bush for lumbar spine arthritis and reports that she had epidural steroid injections x2.  She reports the first injection helped a little bit but the second injection did not provide any relief.  She continues to have sharp and aching pain in her lower back and sometimes pain shoots into her thighs and legs.  Back pain is worsened with walking or activities.  Additionally she has severe pain in her right shoulder.  She reports shoulder injection did not help the pain.  Shoulder pain is worsened with movement.  She additionally has pain in both of her hips hands, knees.  Pain makes it difficult to sleep.  She is followed by rheumatology by Dr. Leigh Aurora and Leafy Kindle and is currently treated with methotrexate.  She says that she was was previously on Enbrel.    Prior treatments Tylenol and ibuprofen in past helped slightly Diclofenac oral did not help Blu Emu cream- helps a little Gabapentin '1200mg'$  BID to TID with mild to moderate improvement She has not tried Lyrica Hydrocodone helped in the past, it stopped working  She has not tried oxycodone Tizanidine did not help  Pain Inventory Average Pain 10 Pain Right Now 10 My pain is sharp, burning, stabbing, aching, and feet on ice  In the last 24 hours, has pain interfered with the following? General activity  10 Relation with others 1 Enjoyment of life 10 What TIME of day is your pain at its worst? morning  and night Sleep (in general) Poor  Pain is worse with: walking and standing Pain improves with: rest Relief from Meds: 3  walk without assistance walk with assistance ability to climb steps?  no  retired I need assistance with the following:  dressing, bathing, meal prep, household duties, and shopping  weakness numbness tingling depression anxiety  Any changes since last visit?  no  Any changes since last visit?  no Primary care Adam Phenix DO Orthopedist Dr Nelva Bush    Family History  Problem Relation Age of Onset   Alzheimer's disease Mother    Lung cancer Mother 64   Diabetes Father    Heart disease Father    Arthritis Sister    Diabetes Sister    Diabetes Brother    Hypertension Brother    Colon cancer Neg Hx    Stomach cancer Neg Hx    Colon polyps Neg Hx    Esophageal cancer Neg Hx    Rectal cancer Neg Hx    Social History   Socioeconomic History   Marital status: Significant Other    Spouse name: Not on file   Number of children: 1   Years of education: 11th   Highest education level: 11th grade  Occupational History   Occupation: retired  Tobacco Use  Smoking status: Former    Packs/day: 1.00    Years: 46.00    Total pack years: 46.00    Types: Cigarettes   Smokeless tobacco: Never  Vaping Use   Vaping Use: Some days  Substance and Sexual Activity   Alcohol use: No    Alcohol/week: 0.0 standard drinks of alcohol   Drug use: No   Sexual activity: Not Currently  Other Topics Concern   Not on file  Social History Narrative   The patient is divorced she has 1 son who is married that she has a grandchild.   She does not use alcohol or drugs she drinks 3-4 caffeinated beverages daily   She is a smoker   She lives with her boyfriend   Social Determinants of Health   Financial Resource Strain: Medium Risk (04/15/2021)   Overall  Financial Resource Strain (CARDIA)    Difficulty of Paying Living Expenses: Somewhat hard  Food Insecurity: No Food Insecurity (04/15/2021)   Hunger Vital Sign    Worried About Running Out of Food in the Last Year: Never true    [place] in the Last Year: Never true  Transportation Needs: No Transportation Needs (04/15/2021)   PRAPARE - Hydrologist (Medical): No    Lack of Transportation (Non-Medical): No  Physical Activity: Inactive (07/24/2021)   Exercise Vital Sign    Days of Exercise per Week: 0 days    Minutes of Exercise per Session: 0 min  Stress: Stress Concern Present (07/24/2021)   [place]    Feeling of Stress : To some extent  Social Connections: Moderately Isolated (04/15/2021)   Social Connection and Isolation Panel [NHANES]    Frequency of Communication with Friends and Family: More than three times a week    Frequency of Social Gatherings with Friends and Family: More than three times a week    Attends Religious Services: Never    Marine scientist or Organizations: No    Attends Archivist Meetings: Never    Marital Status: Living with partner   Past Surgical History:  Procedure Laterality Date   BREAST LUMPECTOMY Right 2018   BREAST LUMPECTOMY WITH RADIOACTIVE SEED LOCALIZATION Left 03/03/2016   Procedure: BREAST LUMPECTOMY WITH RADIOACTIVE SEED LOCALIZATION;  Surgeon: Coralie Keens, MD;  Location: [place];  Service: General;  Laterality: Left;   COLONOSCOPY  08/2002   RMR: internal hemorrhoids   COLONOSCOPY N/A 10/21/2015   Procedure: COLONOSCOPY;  Surgeon: Daneil Dolin, MD;  [facility];  Service: Endoscopy;  Laterality: N/A;  250 - moved to 2:35 - office to notify pt   CYST REMOVAL TRUNK     ESOPHAGOGASTRODUODENOSCOPY  08/2002   RMR: nonerosive reflux esophagitis, hiatal hernia   TONSILLECTOMY     WISDOM TOOTH EXTRACTION     Past  Medical History:  Diagnosis Date   Allergy    seasonal allergies   Anemia    hx of   Arthritis    rheumatoid   Depression    on meds   Diabetes ([place])    Diabetic peripheral neuropathy (HCC)    GERD (gastroesophageal reflux disease)    on meds   Headache    Hyperlipidemia    on meds   Hyperplastic rectal polyp 10/21/2015   Hypertension    pt. denies   Lymphocytic colitis    Neuropathy    Rheumatoid arthritis ([place])  Shortness of breath dyspnea    with exertion   BP 119/76   Pulse 70   Ht '5\' 1"'$  (1.549 m)   Wt 202 lb 6.4 oz (91.8 kg)   SpO2 97%   BMI 38.24 kg/m   Opioid Risk Score:   Fall Risk Score:  `1  Depression screen [facility] 2/9     06/05/2022   11:01 AM 05/04/2022    4:47 PM 11/19/2021    2:36 PM 09/16/2021   10:48 AM 07/24/2021    9:46 AM 04/04/2021   11:32 AM 04/01/2021   12:59 PM  Depression screen PHQ 2/9  Decreased Interest '3 2 1 2 1 1 2  '$ Down, Depressed, Hopeless '3 3 3 3 1 1 1  '$ PHQ - 2 Score '6 5 4 5 2 2 3  '$ Altered sleeping '3 3 3 3 1 3 2  '$ Tired, decreased energy '3 3 3 3 2 3 3  '$ Change in appetite 0 3 0 1 0 0 1  Feeling bad or failure about yourself  '3 3 3 3 1 1 1  '$ Trouble concentrating 0 1 1 0 1 0 0  Moving slowly or fidgety/restless 1 0 '3 2 1 3 1  '$ Suicidal thoughts 0 0 0 0 0 0 0  PHQ-9 Score '16 18 17 17 8 12 11  '$ Difficult doing work/chores Extremely dIfficult Extremely dIfficult Somewhat difficult Somewhat difficult Somewhat difficult Somewhat difficult Somewhat difficult     Review of Systems  Constitutional:  Positive for diaphoresis.  HENT: Negative.    Eyes: Negative.   Respiratory: Negative.    Cardiovascular: Negative.   Gastrointestinal: Negative.   Endocrine: Negative.   Genitourinary: Negative.   Musculoskeletal:  Positive for arthralgias and back pain.  Skin: Negative.   Allergic/Immunologic: Negative.   Neurological:  Positive for weakness and numbness.       Tingling  Hematological: Negative.   Psychiatric/Behavioral:  Positive  for dysphoric mood. The patient is nervous/anxious.   All other systems reviewed and are negative.      Objective:   Physical Exam Gen: no distress, normal appearing HEENT: oral mucosa pink and moist, NCAT Cardio: Reg rate Chest: normal effort, normal rate of breathing Abd: soft, non-distended Ext: no edema Psych: pleasant, appears sad when talking about her pain Skin: intact Neuro: CN 2-12 grossly intact, follows commands Strength 5/5 in all 4 extremities with exception of R shoulder 4/5 limited by pain Decreased sensation to LT in b/l LE below the knee Decreased sensation to vibration at 1st MTP and medial maleolus Mild tenderness reported at b/l Knee joint line Musculoskeletal: Decreased ROM R shoulder able to abduct to about 90 degrees  Pain with movement in all directions of her shoulder Mild L shoulder tenderenss Paraspinal tenderness C spine T spine and L spine , greatest over Right L spine SLR negative  Back pain with internal and external rotation of her b/l hips    Lumbar xray 08/26/17 FINDINGS: Five lumbar type vertebral bodies are well visualized. Vertebral body height is well maintained. Multilevel disc space narrowing is noted throughout the lumbar spine progressed in the interval from the prior exam. Osteophytic changes are noted as well. No anterolisthesis is seen. A somewhat rounded calcification is noted in the right upper quadrant which may be related to cholelithiasis. Clinical correlation is recommended.   IMPRESSION: Multilevel degenerative changes progressed when compared with the prior exam of 20/8.   Rounded calcification in the right upper quadrant may represent cholelithiasis.  MRI shoulder 08/22/21 IMPRESSION: 1. Full-thickness, full width tears of the supraspinatus and infraspinatus tendons with retraction to the glenoid. 2. Full-thickness subscapularis tear involving the mid to superior fibers with tendon gap up to 1.0 cm, and  interstitial extension proximally. 3. Grade 2 muscle atrophy of the supraspinatus, infraspinatus, and subscapularis muscles. 4. Moderate glenohumeral and acromioclavicular joint osteoarthritis. 5. Likely torn and retracted long head biceps tendon. 6. Degenerative posterosuperior labral tearing.      Assessment & Plan:   DM with polyneuropathy -Discussed Qutenza as an option for neuropathic pain control. Discussed that this is a capsaicin patch, stronger than capsaicin cream. Discussed that it is currently approved for diabetic peripheral neuropathy and post-herpetic neuralgia, but that it has also shown benefit in treating other forms of neuropathy. Provided patient with link to site to learn more about the patch: [employer]. Discussed that the patch would be placed in office and benefits usually last 3 months. Discussed that unintended exposure to capsaicin can cause severe irritation of eyes, mucous membranes, respiratory tract, and skin, but that Qutenza is a local treatment and does not have the systemic side effects of other nerve medications. Discussed that there may be pain, itching, erythema, and decreased sensory function associated with the application of Qutenza. Side effects usually subside within 1 week. A cold pack of analgesic medications can help with these side effects. Blood pressure can also be increased due to pain associated with administration of the patch.  -DC gabapentin and start Lyrica '150mg'$  BID  Chronic lower back pain, lumbar spondylosis -UDS and pain agreement completed -Consider oxycodone '5mg'$  Q8h    Right shoulder pain with biceps SS, IS and Subscapularis tears, GH and AC arthritis  -Continue medications as above -Voltaren gel   Rheumatoid arthritis and polyarthralgia -Continue f/u with rheumatology   12/22 addendum called to f/u regarding medication and plan,  left discrete VM

## 2022-06-12 ENCOUNTER — Other Ambulatory Visit: Payer: Self-pay | Admitting: Family Medicine

## 2022-06-12 DIAGNOSIS — F32A Depression, unspecified: Secondary | ICD-10-CM

## 2022-06-15 ENCOUNTER — Telehealth: Payer: Self-pay

## 2022-06-15 NOTE — Telephone Encounter (Signed)
Received notification from [employer] regarding RE-ENROLLMENT approval for [facility]. Patient assistance approved from 08/17/22 to 08/17/23.  Phone: [phone]

## 2022-06-18 ENCOUNTER — Encounter: Payer: Self-pay | Admitting: Family Medicine

## 2022-06-18 ENCOUNTER — Ambulatory Visit (INDEPENDENT_AMBULATORY_CARE_PROVIDER_SITE_OTHER): Payer: Medicare Other | Admitting: Family Medicine

## 2022-06-18 ENCOUNTER — Telehealth: Payer: Self-pay

## 2022-06-18 DIAGNOSIS — J329 Chronic sinusitis, unspecified: Secondary | ICD-10-CM | POA: Diagnosis not present

## 2022-06-18 DIAGNOSIS — J40 Bronchitis, not specified as acute or chronic: Secondary | ICD-10-CM | POA: Diagnosis not present

## 2022-06-18 MED ORDER — AMOXICILLIN-POT CLAVULANATE 875-125 MG PO TABS: 1.0000 | ORAL_TABLET | Freq: Two times a day (BID) | ORAL | 0 refills | Status: DC

## 2022-06-18 NOTE — Telephone Encounter (Signed)
Mailed Novo Nordisk renewal application to patient home.   Jermani Pund L. CPhT Rx Patient Advocate  

## 2022-06-18 NOTE — Progress Notes (Signed)
Subjective:    Patient ID: Donna Rogers, female    DOB: [birth year], 71 y.o.   MRN: [id number]   HPI: Donna Rogers is a 71 y.o. female presenting for coughing dark green. Getting dizzy, light headed. Weak. Posterior drainage giving her a sore throat. Covid test is negaative at home. No fever, no dyspnea. Appetite is okay. No NVD. Onset was 4-5 days ago.  Mucinex DM helps some with the cough.      06/05/2022   11:01 AM 05/04/2022    4:47 PM 11/19/2021    2:36 PM 09/16/2021   10:48 AM 07/24/2021    9:46 AM  Depression screen PHQ 2/9  Decreased Interest '3 2 1 2 1  '$ Down, Depressed, Hopeless '3 3 3 3 1  '$ PHQ - 2 Score '6 5 4 5 2  '$ Altered sleeping '3 3 3 3 1  '$ Tired, decreased energy '3 3 3 3 2  '$ Change in appetite 0 3 0 1 0  Feeling bad or failure about yourself  '3 3 3 3 1  '$ Trouble concentrating 0 1 1 0 1  Moving slowly or fidgety/restless 1 0 '3 2 1  '$ Suicidal thoughts 0 0 0 0 0  PHQ-9 Score '16 18 17 17 8  '$ Difficult doing work/chores Extremely dIfficult Extremely dIfficult Somewhat difficult Somewhat difficult Somewhat difficult     Relevant past medical, surgical, family and social history reviewed and updated as indicated.  Interim medical history since our last visit reviewed. Allergies and medications reviewed and updated.  ROS:  Review of Systems  Constitutional:  Negative for activity change, appetite change, chills and fever.  HENT:  Positive for congestion, postnasal drip, rhinorrhea and sinus pressure. Negative for ear discharge, ear pain, hearing loss, nosebleeds, sneezing and trouble swallowing.   Respiratory:  Negative for chest tightness and shortness of breath.   Cardiovascular:  Negative for chest pain and palpitations.  Skin:  Negative for rash.     Social History   Tobacco Use  Smoking Status Former   Packs/day: 1.00   Years: 46.00   Total pack years: 46.00   Types: Cigarettes  Smokeless Tobacco Never       Objective:     Wt Readings from Last 3  Encounters:  06/05/22 202 lb 6.4 oz (91.8 kg)  05/04/22 202 lb 12.8 oz (92 kg)  11/19/21 198 lb 9.6 oz (90.1 kg)     Exam deferred. Pt. Harboring due to COVID 19. Phone visit performed.   Assessment & Plan:   1. Sinobronchitis     Meds ordered this encounter  Medications   amoxicillin-clavulanate (AUGMENTIN) 875-125 MG tablet    Sig: Take 1 tablet by mouth 2 (two) times daily. Take all of this medication    Dispense:  20 tablet    Refill:  0    No orders of the defined types were placed in this encounter.     Diagnoses and all orders for this visit:  Sinobronchitis  Other orders -     amoxicillin-clavulanate (AUGMENTIN) 875-125 MG tablet; Take 1 tablet by mouth 2 (two) times daily. Take all of this medication    Virtual Visit via telephone Note  I discussed the limitations, risks, security and privacy concerns of performing an evaluation and management service by telephone and the availability of in person appointments. The patient was identified with two identifiers. Pt.expressed understanding and agreed to proceed. Pt. Is at home. Dr. Livia Snellen is in his office.  Follow Up Instructions:  I discussed the assessment and treatment plan with the patient. The patient was provided an opportunity to ask questions and all were answered. The patient agreed with the plan and demonstrated an understanding of the instructions.   The patient was advised to call back or seek an in-person evaluation if the symptoms worsen or if the condition fails to improve as anticipated.   Total minutes including chart review and phone contact time: 7   Follow up plan: Return if symptoms worsen or fail to improve.  Claretta Fraise, MD [place]

## 2022-06-21 LAB — TOXASSURE SELECT,+ANTIDEPR,UR

## 2022-06-25 NOTE — Telephone Encounter (Signed)
Disregard previous note - patient ELIGIBLE for 2024 enrollment.

## 2022-06-30 ENCOUNTER — Telehealth: Payer: Self-pay | Admitting: *Deleted

## 2022-07-02 ENCOUNTER — Encounter: Payer: Self-pay | Admitting: Nurse Practitioner

## 2022-07-02 ENCOUNTER — Ambulatory Visit (INDEPENDENT_AMBULATORY_CARE_PROVIDER_SITE_OTHER): Payer: Medicare Other | Admitting: Nurse Practitioner

## 2022-07-02 DIAGNOSIS — J029 Acute pharyngitis, unspecified: Secondary | ICD-10-CM | POA: Diagnosis not present

## 2022-07-02 MED ORDER — PREDNISONE 20 MG PO TABS: 40.0000 mg | ORAL_TABLET | Freq: Every day | ORAL | 0 refills | Status: AC

## 2022-07-02 NOTE — Progress Notes (Signed)
   Virtual Visit  Note Due to COVID-19 pandemic this visit was conducted virtually. This visit type was conducted due to national recommendations for restrictions regarding the COVID-19 Pandemic (e.g. social distancing, sheltering in place) in an effort to limit this patient's exposure and mitigate transmission in our community. All issues noted in this document were discussed and addressed.  A physical exam was not performed with this format.  I connected with Donna Rogers on 07/02/22 at 3:40 by telephone and verified that I am speaking with the correct person using two identifiers. Donna Rogers is currently located at home and a friend  is currently with her during visit. The provider, Mary-Margaret Hassell Done, FNP is located in their office at time of visit.  I discussed the limitations, risks, security and privacy concerns of performing an evaluation and management service by telephone and the availability of in person appointments. I also discussed with the patient that there may be a patient responsible charge related to this service. The patient expressed understanding and agreed to proceed.   History and Present Illness:  SHe came in office several day ago and brought her grandson in with pharyngitis. Now she is not feeling well.  URI  This is a new problem. The current episode started yesterday. The problem has been gradually worsening. There has been no fever. Associated symptoms include congestion, coughing, headaches and a sore throat. Pertinent negatives include no rhinorrhea. She has tried nothing for the symptoms. The treatment provided mild relief.      Review of Systems  HENT:  Positive for congestion and sore throat. Negative for rhinorrhea.   Respiratory:  Positive for cough.   Neurological:  Positive for headaches.     Observations/Objective: Alert and oriented- answers all questions appropriately No distress Raspy voice Dry cough  Assessment and  Plan: Donna Rogers in today with chief complaint of URI   1. Pharyngitis, unspecified etiology Force fluids Motrin or tylenol OTC OTC decongestant Throat lozenges if help New toothbrush in 3 days  Meds ordered this encounter  Medications   predniSONE (DELTASONE) 20 MG tablet    Sig: Take 2 tablets (40 mg total) by mouth daily with breakfast for 5 days. 2 po daily for 5 days    Dispense:  10 tablet    Refill:  0    Order Specific Question:   Supervising Provider    Answer:   Caryl Pina A [[id number]]      Follow Up Instructions: prn    I discussed the assessment and treatment plan with the patient. The patient was provided an opportunity to ask questions and all were answered. The patient agreed with the plan and demonstrated an understanding of the instructions.   The patient was advised to call back or seek an in-person evaluation if the symptoms worsen or if the condition fails to improve as anticipated.  The above assessment and management plan was discussed with the patient. The patient verbalized understanding of and has agreed to the management plan. Patient is aware to call the clinic if symptoms persist or worsen. Patient is aware when to return to the clinic for a follow-up visit. Patient educated on when it is appropriate to go to the emergency department.   Time call ended:  3:52  I provided 11 minutes of  non face-to-face time during this encounter.    Mary-Margaret Hassell Done, FNP

## 2022-07-02 NOTE — Patient Instructions (Signed)

## 2022-07-03 ENCOUNTER — Encounter: Payer: Medicare Other | Admitting: Physical Medicine & Rehabilitation

## 2022-07-06 ENCOUNTER — Other Ambulatory Visit: Payer: Self-pay | Admitting: Family Medicine

## 2022-07-07 NOTE — Telephone Encounter (Signed)
Urine drug screen for this encounter is consistent for prescribed medication 

## 2022-08-04 ENCOUNTER — Other Ambulatory Visit: Payer: Self-pay | Admitting: Family Medicine

## 2022-08-05 ENCOUNTER — Ambulatory Visit (INDEPENDENT_AMBULATORY_CARE_PROVIDER_SITE_OTHER): Payer: Medicare Other | Admitting: Family Medicine

## 2022-08-05 ENCOUNTER — Encounter: Payer: Self-pay | Admitting: Family Medicine

## 2022-08-05 VITALS — BP 86/57 | HR 76 | Temp 98.3°F | Ht 61.0 in | Wt 204.8 lb

## 2022-08-05 DIAGNOSIS — E785 Hyperlipidemia, unspecified: Secondary | ICD-10-CM | POA: Diagnosis not present

## 2022-08-05 DIAGNOSIS — M0579 Rheumatoid arthritis with rheumatoid factor of multiple sites without organ or systems involvement: Secondary | ICD-10-CM

## 2022-08-05 DIAGNOSIS — I152 Hypertension secondary to endocrine disorders: Secondary | ICD-10-CM

## 2022-08-05 DIAGNOSIS — Z23 Encounter for immunization: Secondary | ICD-10-CM | POA: Diagnosis not present

## 2022-08-05 DIAGNOSIS — E1142 Type 2 diabetes mellitus with diabetic polyneuropathy: Secondary | ICD-10-CM | POA: Diagnosis not present

## 2022-08-05 DIAGNOSIS — E1169 Type 2 diabetes mellitus with other specified complication: Secondary | ICD-10-CM

## 2022-08-05 DIAGNOSIS — E1159 Type 2 diabetes mellitus with other circulatory complications: Secondary | ICD-10-CM | POA: Diagnosis not present

## 2022-08-05 DIAGNOSIS — R6889 Other general symptoms and signs: Secondary | ICD-10-CM | POA: Diagnosis not present

## 2022-08-05 LAB — BAYER DCA HB A1C WAIVED: HB A1C (BAYER DCA - WAIVED): 10 % — ABNORMAL HIGH (ref 4.8–5.6)

## 2022-08-05 MED ORDER — LANCET DEVICE MISC: 12 refills | Status: AC

## 2022-08-05 MED ORDER — GLUCOSE BLOOD VI STRP: ORAL_STRIP | 12 refills | Status: DC

## 2022-08-05 MED ORDER — OZEMPIC (0.25 OR 0.5 MG/DOSE) 2 MG/3ML ~~LOC~~ SOPN: 1.0000 mg | PEN_INJECTOR | SUBCUTANEOUS | 3 refills | Status: DC

## 2022-08-05 MED ORDER — BLOOD GLUCOSE METER KIT: PACK | 0 refills | Status: AC

## 2022-08-05 NOTE — Patient Instructions (Signed)
STOP Rybelsus START Ozempic.  This is the shot version of Rybelsus but it comes in higher strengths  Month #1: Inject 0.'5mg'$  into the skin ONCE every 7 days.  Month #2: Start new '1mg'$  dose into the skin ONCE every 7 days. This will be ordered by Almyra Free for you I've ordered a new meter and supplies for you to check sugar. Schedule a visit with your rheumatologist.

## 2022-08-05 NOTE — Progress Notes (Signed)
Subjective: CC:Dm PCP: [name], DO Donna Rogers is a 71 y.o. female presenting to clinic today for:  1. Type 2 Diabetes with hypertension, hyperlipidemia:  Patient reports compliance with the Rybelsus, Farxiga, glipizide, amlodipine, Cozaar and Zocor as directed.  Her gabapentin was recently switched to Lyrica and this does seem to be working slightly better.  Last eye exam: [facility] Last foot exam: UTD Last A1c:  Lab Results  Component Value Date   HGBA1C 6.8 (H) 05/04/2022   Nephropathy screen indicated?: UTD Last flu, zoster and/or pneumovax:  Immunization History  Administered Date(s) Administered   Fluad Quad(high Dose 65+) 05/03/2019, 05/04/2022   Influenza,inj,Quad PF,6+ Mos 05/25/2013, 07/31/2015, 06/18/2016, 05/16/2018   Influenza-Unspecified 05/07/2019, 07/24/2020, 05/08/2021   PPD Test 05/08/2013   Pneumococcal Conjugate-13 05/16/2018   Pneumococcal Polysaccharide-23 07/03/2019   Tdap 12/28/2018   Zoster Recombinat (Shingrix) 06/06/2019, 06/30/2019, 10/27/2019    ROS: No chest pain or shortness of breath.  She continues to suffer from neuropathy but denies any diabetic foot ulcers.  She thinks that she may need some labs to be CCed to her rheumatologist, Leafy Kindle.  Placed on prednisone for an upper respiratory infection less than 3 months ago.  Admits to eating candy.  Needs new meter, lancets and test strips.  The last blood sugar that she tested was 240 but she ran out of supplies   ROS: Per HPI  Allergies  Allergen Reactions   Ciprofloxacin Hcl Rash   Enbrel [Etanercept] Rash   Metformin And Related Diarrhea   Past Medical History:  Diagnosis Date   Allergy    seasonal allergies   Anemia    hx of   Arthritis    rheumatoid   Depression    on meds   Diabetes (HCC)    Diabetic peripheral neuropathy (HCC)    GERD (gastroesophageal reflux disease)    on meds   Headache    Hyperlipidemia    on meds   Hyperplastic  rectal polyp 10/21/2015   Hypertension    pt. denies   Lymphocytic colitis    Neuropathy    Rheumatoid arthritis (HCC)    Shortness of breath dyspnea    with exertion    Current Outpatient Medications:    Alpha-Lipoic Acid 600 MG CAPS, TAKE (1) CAPSULE DAILY FOR NERVE PAIN, Disp: 90 capsule, Rfl: 0   amLODipine (NORVASC) 5 MG tablet, TAKE ONE TABLET ONCE DAILY, Disp: 30 tablet, Rfl: 1   Ascorbic Acid (VITAMIN C) 500 MG CHEW, 1 tablet, Disp: , Rfl:    aspirin 81 MG tablet, Take 81 mg by mouth daily., Disp: , Rfl:    atenolol (TENORMIN) 25 MG tablet, TAKE 1 TABLET BY MOUTH DAILY., Disp: 90 tablet, Rfl: 1   buPROPion (WELLBUTRIN XL) 300 MG 24 hr tablet, TAKE ONE TABLET ONCE DAILY, Disp: 30 tablet, Rfl: 2   busPIRone (BUSPAR) 10 MG tablet, TAKE (1) TABLET TWICE A DAY., Disp: 60 tablet, Rfl: 1   cholecalciferol (VITAMIN D) 1000 UNITS tablet, Take 1,000 Units by mouth daily., Disp: , Rfl:    citalopram (CELEXA) 40 MG tablet, TAKE ONE TABLET ONCE DAILY, Disp: 30 tablet, Rfl: 0   Cyanocobalamin (VITAMIN B 12 PO), Take by mouth., Disp: , Rfl:    dapagliflozin propanediol (FARXIGA) 10 MG TABS tablet, Take 10 mg by mouth daily., Disp: , Rfl:    folic acid (FOLVITE) 1 MG tablet, Take 1 mg by mouth daily., Disp: , Rfl:    glipiZIDE (GLUCOTROL XL)  5 MG 24 hr tablet, TAKE ONE TABLET TWICE DAILY WITH MEAL(S), Disp: 180 tablet, Rfl: 1   HYDROcodone-acetaminophen (NORCO) 5-325 MG tablet, Take 1 tablet by mouth every 12 (twelve) hours as needed for severe pain., Disp: 60 tablet, Rfl: 0   losartan (COZAAR) 100 MG tablet, TAKE 1 TABLET DAILY, Disp: 90 tablet, Rfl: 1   methotrexate (RHEUMATREX) 2.5 MG tablet, Take 15 mg by mouth once a week., Disp: , Rfl:    omeprazole (PRILOSEC) 40 MG capsule, Take 1 capsule (40 mg total) by mouth daily., Disp: 90 capsule, Rfl: 3   pregabalin (LYRICA) 150 MG capsule, Take 1 capsule (150 mg total) by mouth 2 (two) times daily., Disp: 60 capsule, Rfl: 2   Pyridoxine HCl  (VITAMIN B-6 PO), Take by mouth., Disp: , Rfl:    Semaglutide (RYBELSUS) 14 MG TABS, Take 14 mg by mouth daily., Disp: 30 tablet, Rfl: 0   simvastatin (ZOCOR) 40 MG tablet, TAKE 1 TABLET DAILY, Disp: 90 tablet, Rfl: 0   SULFASALAZINE PO, Take by mouth., Disp: , Rfl:    Suvorexant (BELSOMRA) 10 MG TABS, Take 10 mg by mouth at bedtime as needed., Disp: 30 tablet, Rfl: 0   tiZANidine (ZANAFLEX) 4 MG tablet, Take 0.5-1 tablets (2-4 mg total) by mouth every 8 (eight) hours as needed for muscle spasms., Disp: 90 tablet, Rfl: 0  Current Facility-Administered Medications:    diclofenac Sodium (VOLTAREN) 1 % topical gel 2 g, 2 g, Topical, QID, Jennye Boroughs, MD Social History   Socioeconomic History   Marital status: Significant Other    Spouse name: Not on file   Number of children: 1   Years of education: 11th   Highest education level: 11th grade  Occupational History   Occupation: retired  Tobacco Use   Smoking status: Former    Packs/day: 1.00    Years: 46.00    Total pack years: 46.00    Types: Cigarettes   Smokeless tobacco: Never  Vaping Use   Vaping Use: Some days  Substance and Sexual Activity   Alcohol use: No    Alcohol/week: 0.0 standard drinks of alcohol   Drug use: No   Sexual activity: Not Currently  Other Topics Concern   Not on file  Social History Narrative   The patient is divorced she has 1 son who is married that she has a grandchild.   She does not use alcohol or drugs she drinks 3-4 caffeinated beverages daily   She is a smoker   She lives with her boyfriend   Social Determinants of Health   Financial Resource Strain: Medium Risk (04/15/2021)   Overall Financial Resource Strain (CARDIA)    Difficulty of Paying Living Expenses: Somewhat hard  Food Insecurity: No Food Insecurity (04/15/2021)   Hunger Vital Sign    Worried About Running Out of Food in the Last Year: Never true    [place] in the Last Year: Never true  Transportation Needs: No  Transportation Needs (04/15/2021)   PRAPARE - Hydrologist (Medical): No    Lack of Transportation (Non-Medical): No  Physical Activity: Inactive (07/24/2021)   Exercise Vital Sign    Days of Exercise per Week: 0 days    Minutes of Exercise per Session: 0 min  Stress: Stress Concern Present (07/24/2021)   [place]    Feeling of Stress : To some extent  Social Connections: Moderately Isolated (04/15/2021)  Social Licensed conveyancer [NHANES]    Frequency of Communication with Friends and Family: More than three times a week    Frequency of Social Gatherings with Friends and Family: More than three times a week    Attends Religious Services: Never    Marine scientist or Organizations: No    Attends Archivist Meetings: Never    Marital Status: Living with partner  Intimate Partner Violence: Not At Risk (04/15/2021)   Humiliation, Afraid, Rape, and Kick questionnaire    Fear of Current or Ex-Partner: No    Emotionally Abused: No    Physically Abused: No    Sexually Abused: No   Family History  Problem Relation Age of Onset   Alzheimer's disease Mother    Lung cancer Mother 77   Diabetes Father    Heart disease Father    Arthritis Sister    Diabetes Sister    Diabetes Brother    Hypertension Brother    Colon cancer Neg Hx    Stomach cancer Neg Hx    Colon polyps Neg Hx    Esophageal cancer Neg Hx    Rectal cancer Neg Hx     Objective: Office vital signs reviewed. BP (!) 86/57   Pulse 76   Temp 98.3 F (36.8 C)   Ht _0  (1.549 m)   Wt 204 lb 12.8 oz (92.9 kg)   SpO2 97%   BMI 38.70 kg/m   Physical Examination:  General: Awake, alert, obese, No acute distress HEENT: sclera white, MMM Cardio: regular rate and rhythm, S1S2 heard, no murmurs appreciated Pulm: clear to auscultation bilaterally, no wheezes, rhonchi or rales; normal work of breathing  on room air  Assessment/ Plan: 71 y.o. female   Type 2 diabetes mellitus with other specified complication, without long-term current use of insulin (HCC) - Plan: Bayer DCA Hb A1c Waived, Semaglutide,0.25 or 0.5MG/DOS, (OZEMPIC, 0.25 OR 0.5 MG/DOSE,) 2 MG/3ML SOPN, blood glucose meter kit and supplies, Lancet Device MISC, glucose blood test strip, Hemoglobin A1c  Hypertension associated with diabetes (HCC)  Diabetic polyneuropathy associated with type 2 diabetes mellitus (HCC)  Hyperlipidemia associated with type 2 diabetes mellitus (HCC)  Rheumatoid arthritis involving multiple sites with positive rheumatoid factor ([place]) - Plan: CBC with Differential, CMP14+EGFR, Folate, VITAMIN D 25 Hydroxy (Vit-D Deficiency, Fractures)  Need for immunization against influenza - Plan: Flu Vaccine QUAD High Dose(Fluad)  Sugar is not at goal and A1c has increased quite a bit since her last visit.  I am going to place her on an injectable version of the Rybelsus in efforts to improve not only blood sugar but reduce pill burden.  I have given her a month supply of Ozempic and she will start it 0.5 mg which should be roughly equivalent to the 14 mg of Rybelsus she is currently taking.  I would like to ultimately have her on 1 mg of the Ozempic and I will CC the clinical pharmacist to see if we can get this through patient assistance.  I demonstrated how to utilize the pen today.  New testing supplies sent to pharmacy.  Encourage p.o. hydration.  I would like her to see our clinical pharmacist in 1 month with blood sugar log  Blood pressure is too soft today.  I want her to hold her amlodipine.  She will monitor blood pressures and as long as they stay below 140/90, no need to resume use.  Hopefully we can recheck this at her visit  in 1 month with clinical pharmacy  Continue to follow-up with specialist as directed.  They are managing her Lyrica  Continue statin for lipids  Labs were collected in preparation for  her appointment with rheumatology.  Hopefully we obtained everything that they need  Influenza vaccination administered  Medications Discontinued During This Encounter  Medication Reason   amoxicillin-clavulanate (AUGMENTIN) 875-125 MG tablet    benzonatate (TESSALON PERLES) 100 MG capsule    azelastine (OPTIVAR) 0.05 % ophthalmic solution    budesonide (ENTOCORT EC) 3 MG 24 hr capsule    diclofenac (VOLTAREN) 75 MG EC tablet    diphenoxylate-atropine (LOMOTIL) 2.5-0.025 MG tablet    Oyster Shell Calcium 500 MG TABS    HYDROcodone-acetaminophen (NORCO) 5-325 MG tablet    Semaglutide (RYBELSUS) 14 MG TABS Change in therapy   amLODipine (NORVASC) 5 MG tablet    Orders Placed This Encounter  Procedures   Bayer DCA Hb A1c Waived   No orders of the defined types were placed in this encounter.    [name], DO [place] [phone]

## 2022-08-06 LAB — CBC WITH DIFFERENTIAL/PLATELET
Basophils Absolute: 0.1 10*3/uL (ref 0.0–0.2)
Basos: 1 %
EOS (ABSOLUTE): 0.3 10*3/uL (ref 0.0–0.4)
Eos: 4 %
Hematocrit: 42.5 % (ref 34.0–46.6)
Hemoglobin: 14.6 g/dL (ref 11.1–15.9)
Immature Grans (Abs): 0 10*3/uL (ref 0.0–0.1)
Immature Granulocytes: 0 %
Lymphocytes Absolute: 3.7 10*3/uL — ABNORMAL HIGH (ref 0.7–3.1)
Lymphs: 46 %
MCH: 31.1 pg (ref 26.6–33.0)
MCHC: 34.4 g/dL (ref 31.5–35.7)
MCV: 90 fL (ref 79–97)
Monocytes Absolute: 0.6 10*3/uL (ref 0.1–0.9)
Monocytes: 7 %
Neutrophils Absolute: 3.3 10*3/uL (ref 1.4–7.0)
Neutrophils: 42 %
Platelets: 232 10*3/uL (ref 150–450)
RBC: 4.7 x10E6/uL (ref 3.77–5.28)
RDW: 12 % (ref 11.7–15.4)
WBC: 8 10*3/uL (ref 3.4–10.8)

## 2022-08-06 LAB — CMP14+EGFR
ALT: 32 IU/L (ref 0–32)
AST: 24 IU/L (ref 0–40)
Albumin/Globulin Ratio: 1.6 (ref 1.2–2.2)
Albumin: 4.4 g/dL (ref 3.9–4.9)
Alkaline Phosphatase: 85 IU/L (ref 44–121)
BUN/Creatinine Ratio: 10 — ABNORMAL LOW (ref 12–28)
BUN: 11 mg/dL (ref 8–27)
Bilirubin Total: 0.4 mg/dL (ref 0.0–1.2)
CO2: 26 mmol/L (ref 20–29)
Calcium: 9.4 mg/dL (ref 8.7–10.3)
Chloride: 97 mmol/L (ref 96–106)
Creatinine, Ser: 1.09 mg/dL — ABNORMAL HIGH (ref 0.57–1.00)
Globulin, Total: 2.7 g/dL (ref 1.5–4.5)
Glucose: 237 mg/dL — ABNORMAL HIGH (ref 70–99)
Potassium: 4 mmol/L (ref 3.5–5.2)
Sodium: 138 mmol/L (ref 134–144)
Total Protein: 7.1 g/dL (ref 6.0–8.5)
eGFR: 55 mL/min/{1.73_m2} — ABNORMAL LOW (ref >59)

## 2022-08-06 LAB — HEMOGLOBIN A1C
Est. average glucose Bld gHb Est-mCnc: 229 mg/dL
Hgb A1c MFr Bld: 9.6 % — ABNORMAL HIGH (ref 4.8–5.6)

## 2022-08-06 LAB — VITAMIN D 25 HYDROXY (VIT D DEFICIENCY, FRACTURES): Vit D, 25-Hydroxy: 38.6 ng/mL (ref 30.0–100.0)

## 2022-08-06 LAB — FOLATE: Folate: 11.2 ng/mL (ref >3.0)

## 2022-08-07 ENCOUNTER — Telehealth (HOSPITAL_COMMUNITY): Payer: Self-pay | Admitting: Physical Medicine & Rehabilitation

## 2022-08-18 DIAGNOSIS — R5382 Chronic fatigue, unspecified: Secondary | ICD-10-CM | POA: Diagnosis not present

## 2022-08-18 DIAGNOSIS — M5136 Other intervertebral disc degeneration, lumbar region: Secondary | ICD-10-CM | POA: Diagnosis not present

## 2022-08-18 DIAGNOSIS — M1991 Primary osteoarthritis, unspecified site: Secondary | ICD-10-CM | POA: Diagnosis not present

## 2022-08-18 DIAGNOSIS — K52832 Lymphocytic colitis: Secondary | ICD-10-CM | POA: Diagnosis not present

## 2022-08-18 DIAGNOSIS — M8580 Other specified disorders of bone density and structure, unspecified site: Secondary | ICD-10-CM | POA: Diagnosis not present

## 2022-08-18 DIAGNOSIS — M0579 Rheumatoid arthritis with rheumatoid factor of multiple sites without organ or systems involvement: Secondary | ICD-10-CM | POA: Diagnosis not present

## 2022-08-20 ENCOUNTER — Encounter: Payer: Medicare Other | Attending: Physical Medicine & Rehabilitation | Admitting: Physical Medicine & Rehabilitation

## 2022-08-20 ENCOUNTER — Encounter: Payer: Self-pay | Admitting: Physical Medicine & Rehabilitation

## 2022-08-20 VITALS — BP 109/76 | HR 70 | Temp 98.1°F | Ht 61.0 in | Wt 203.0 lb

## 2022-08-20 DIAGNOSIS — G8929 Other chronic pain: Secondary | ICD-10-CM | POA: Insufficient documentation

## 2022-08-20 DIAGNOSIS — G894 Chronic pain syndrome: Secondary | ICD-10-CM | POA: Diagnosis not present

## 2022-08-20 DIAGNOSIS — M069 Rheumatoid arthritis, unspecified: Secondary | ICD-10-CM

## 2022-08-20 DIAGNOSIS — M25511 Pain in right shoulder: Secondary | ICD-10-CM | POA: Diagnosis not present

## 2022-08-20 DIAGNOSIS — Z79891 Long term (current) use of opiate analgesic: Secondary | ICD-10-CM | POA: Diagnosis not present

## 2022-08-20 DIAGNOSIS — M545 Low back pain, unspecified: Secondary | ICD-10-CM | POA: Diagnosis not present

## 2022-08-20 DIAGNOSIS — E1142 Type 2 diabetes mellitus with diabetic polyneuropathy: Secondary | ICD-10-CM | POA: Diagnosis not present

## 2022-08-20 MED ORDER — OXYCODONE-ACETAMINOPHEN 5-325 MG PO TABS: 1.0000 | ORAL_TABLET | Freq: Three times a day (TID) | ORAL | 0 refills | Status: DC | PRN

## 2022-08-20 NOTE — Progress Notes (Signed)
Subjective:    Patient ID: Donna Rogers, female    DOB: 02-20-1951, 72 y.o.   MRN: [id number]  HPI HPI on 06/05/22 Donna Rogers is a 72 yo female with PMH of rheumatoid arthritis, chronic low back pain, diabetes mellitus type 2 with polyneuropathy, GERD, carpal tunnel right wrist, depression who is here for chronic pain.  Patient reports that she has been having burning and freezing pain in her feet that has been worsening over the past few years.  She says she feels like her feet are on ice.  Pain is worse on the dorsal and plantar aspects of her feet however she also has this pain over her ankles.  She was seen in the past by Dr. Nelva Bush for lumbar spine arthritis and reports that she had epidural steroid injections x2.  She reports the first injection helped a little bit but the second injection did not provide any relief.  She continues to have sharp and aching pain in her lower back and sometimes pain shoots into her thighs and legs.  Back pain is worsened with walking or activities.  Additionally she has severe pain in her right shoulder.  She reports shoulder injection did not help the pain.  Shoulder pain is worsened with movement.  She additionally has pain in both of her hips hands, knees.  Pain makes it difficult to sleep.  She is followed by rheumatology by Dr. Leigh Aurora and Leafy Kindle and is currently treated with methotrexate.  She says that she was was previously on Enbrel.   Prior treatments Tylenol and ibuprofen in past helped slightly Diclofenac oral did not help Blu Emu cream- helps a little Gabapentin '1200mg'$  BID to TID with mild to moderate improvement She has not tried Lyrica Hydrocodone helped in the past, it stopped working  She has not tried oxycodone Tizanidine did not help  Interval History Donna Rogers is here for follow-up due to her chronic pain.  She reports that her polyneuropathy is much improved since starting Lyrica.  She feels that this works  much better than the gabapentin.  She says she would like to try Qutenza however cost is prohibitive at this time.  She continues to have lower back pain and shoulder pain bilaterally that limits her activities.  The lower back pain did not improve significantly with the change to Lyrica.  She has not had any side effects with this medication.  She reports she previously tried tramadol and it initially helped mildly but later did not provide significant benefit.  She will occasionally get a tingling sensation in the fourth and fifth digits on both hands but this is not present currently.  She feels like she has poor balance overall however has not had any recent falls.  She does not use a cane for ambulation at this time but she has been home.  Pain Inventory Average Pain 8 Pain Right Now 7 My pain is constant and dull  In the last 24 hours, has pain interfered with the following? General activity 10 Relation with others 0 Enjoyment of life 10 What TIME of day is your pain at its worst? night Sleep (in general) Poor  Pain is worse with: walking, bending, standing, and some activites Pain improves with:  nothing Relief from Meds:  no medication for it     Family History  Problem Relation Age of Onset   Alzheimer's disease Mother    Lung cancer Mother 30   Diabetes Father  Heart disease Father    Arthritis Sister    Diabetes Sister    Diabetes Brother    Hypertension Brother    Colon cancer Neg Hx    Stomach cancer Neg Hx    Colon polyps Neg Hx    Esophageal cancer Neg Hx    Rectal cancer Neg Hx    Social History   Socioeconomic History   Marital status: Significant Other    Spouse name: Not on file   Number of children: 1   Years of education: 11th   Highest education level: 11th grade  Occupational History   Occupation: retired  Tobacco Use   Smoking status: Former    Packs/day: 1.00    Years: 46.00    Total pack years: 46.00    Types: Cigarettes   Smokeless  tobacco: Never  Vaping Use   Vaping Use: Some days  Substance and Sexual Activity   Alcohol use: No    Alcohol/week: 0.0 standard drinks of alcohol   Drug use: No   Sexual activity: Not Currently  Other Topics Concern   Not on file  Social History Narrative   The patient is divorced she has 1 son who is married that she has a grandchild.   She does not use alcohol or drugs she drinks 3-4 caffeinated beverages daily   She is a smoker   She lives with her boyfriend   Social Determinants of Health   Financial Resource Strain: Medium Risk (04/15/2021)   Overall Financial Resource Strain (CARDIA)    Difficulty of Paying Living Expenses: Somewhat hard  Food Insecurity: No Food Insecurity (04/15/2021)   Hunger Vital Sign    Worried About Running Out of Food in the Last Year: Never true    [place] in the Last Year: Never true  Transportation Needs: No Transportation Needs (04/15/2021)   PRAPARE - Hydrologist (Medical): No    Lack of Transportation (Non-Medical): No  Physical Activity: Inactive (07/24/2021)   Exercise Vital Sign    Days of Exercise per Week: 0 days    Minutes of Exercise per Session: 0 min  Stress: Stress Concern Present (07/24/2021)   [place]    Feeling of Stress : To some extent  Social Connections: Moderately Isolated (04/15/2021)   Social Connection and Isolation Panel [NHANES]    Frequency of Communication with Friends and Family: More than three times a week    Frequency of Social Gatherings with Friends and Family: More than three times a week    Attends Religious Services: Never    Marine scientist or Organizations: No    Attends Archivist Meetings: Never    Marital Status: Living with partner   Past Surgical History:  Procedure Laterality Date   BREAST LUMPECTOMY Right 2018   BREAST LUMPECTOMY WITH RADIOACTIVE SEED LOCALIZATION Left  03/03/2016   Procedure: BREAST LUMPECTOMY WITH RADIOACTIVE SEED LOCALIZATION;  Surgeon: Coralie Keens, MD;  Location: [place];  Service: General;  Laterality: Left;   COLONOSCOPY  08/2002   RMR: internal hemorrhoids   COLONOSCOPY N/A 10/21/2015   Procedure: COLONOSCOPY;  Surgeon: Daneil Dolin, MD;  [facility];  Service: Endoscopy;  Laterality: N/A;  250 - moved to 2:35 - office to notify pt   CYST REMOVAL TRUNK     ESOPHAGOGASTRODUODENOSCOPY  08/2002   RMR: nonerosive reflux esophagitis, hiatal hernia   TONSILLECTOMY  WISDOM TOOTH EXTRACTION     Past Medical History:  Diagnosis Date   Allergy    seasonal allergies   Anemia    hx of   Arthritis    rheumatoid   Depression    on meds   Diabetes ([place])    Diabetic peripheral neuropathy (H[facility])    GERD (gastroesophageal reflux disease)    on meds   Headache    Hyperlipidemia    on meds   Hyperplastic rectal polyp 10/21/2015   Hypertension    pt. denies   Lymphocytic colitis    Neuropathy    Rheumatoid arthritis (HCC)    Shortness of breath dyspnea    with exertion   BP 109/76   Pulse 70   Temp 98.1 F (36.7 C)   Ht '5\' 1"'$  (1.549 m)   Wt 203 lb (92.1 kg)   BMI 38.36 kg/m   Opioid Risk Score:   Fall Risk Score:  `1  Depression screen [facility] 2/9     08/20/2022    1:02 PM 06/05/2022   11:01 AM 05/04/2022    4:47 PM 11/19/2021    2:36 PM 09/16/2021   10:48 AM 07/24/2021    9:46 AM 04/04/2021   11:32 AM  Depression screen PHQ 2/9  Decreased Interest 0 '3 2 1 2 1 1  '$ Down, Depressed, Hopeless 0 '3 3 3 3 1 1  '$ PHQ - 2 Score 0 '6 5 4 5 2 2  '$ Altered sleeping  '3 3 3 3 1 3  '$ Tired, decreased energy  '3 3 3 3 2 3  '$ Change in appetite  0 3 0 1 0 0  Feeling bad or failure about yourself   '3 3 3 3 1 1  '$ Trouble concentrating  0 1 1 0 1 0  Moving slowly or fidgety/restless  1 0 '3 2 1 3  '$ Suicidal thoughts  0 0 0 0 0 0  PHQ-9 Score  '16 18 17 17 8 12  '$ Difficult doing work/chores  Extremely dIfficult Extremely dIfficult Somewhat  difficult Somewhat difficult Somewhat difficult Somewhat difficult    Review of Systems  Musculoskeletal:  Positive for back pain.       Pain in both feet. Right shoulder pain  All other systems reviewed and are negative.      Objective:   Physical Exam  Physical Exam Gen: no distress, normal appearing HEENT: oral mucosa pink and moist, NCAT Chest: normal effort, normal rate of breathing Abd: soft, non-distended Ext: no edema Psych: pleasant, appears sad when talking about her pain Skin: intact Neuro: CN 2-12 grossly intact, follows commands Strength 5/5 in all 4 extremities with exception of R shoulder 4+/5 limited by pain Decreased sensation to LT in b/l LE below the knee Mild tenderness reported at b/l Knee joint line Musculoskeletal: Decreased ROM R shoulder able to abduct to about 90 degrees  Pain with movement in all directions of her right shoulder Mild L shoulder tenderenss Paraspinal tenderness C spine T spine and L spine Slump test negative  Back pain with internal and external rotation of her b/l hips       Lumbar xray 08/26/17 FINDINGS: Five lumbar type vertebral bodies are well visualized. Vertebral body height is well maintained. Multilevel disc space narrowing is noted throughout the lumbar spine progressed in the interval from the prior exam. Osteophytic changes are noted as well. No anterolisthesis is seen. A somewhat rounded calcification is noted in the right upper quadrant which may be related to  cholelithiasis. Clinical correlation is recommended.   IMPRESSION: Multilevel degenerative changes progressed when compared with the prior exam of 20/8.   Rounded calcification in the right upper quadrant may represent cholelithiasis.       MRI shoulder 08/22/21 IMPRESSION: 1. Full-thickness, full width tears of the supraspinatus and infraspinatus tendons with retraction to the glenoid. 2. Full-thickness subscapularis tear involving the mid to  superior fibers with tendon gap up to 1.0 cm, and interstitial extension proximally. 3. Grade 2 muscle atrophy of the supraspinatus, infraspinatus, and subscapularis muscles. 4. Moderate glenohumeral and acromioclavicular joint osteoarthritis. 5. Likely torn and retracted long head biceps tendon. 6. Degenerative posterosuperior labral tearing.      Assessment & Plan:   DM with polyneuropathy -Consider Qutenza, currently patient would like to hold off due to cost -Continue Lyrica '150mg'$  BID -Patient reports intermittent numbness in her fourth and fifth digit, denies current symptoms.  Possibly some ulnar nerve irritation, advised against leaning in her elbow on hard surfaces.  Continue to monitor. -Discussed use of cane and PT to reduce falls.  Patient reports she will try to use cane.  We discussed PT for balance.  She will consider PT and let me know at a later time.   Chronic lower back pain, lumbar spondylosis -UDS and pain agreement completed prior visit -Order oxycodone '5mg'$  Q8h     Right shoulder pain with biceps SS, IS and Subscapularis tears, GH and AC arthritis  -Continue medications as above -Voltaren gel    Rheumatoid arthritis and polyarthralgia -Continue f/u with rheumatology

## 2022-08-25 ENCOUNTER — Telehealth: Payer: Self-pay

## 2022-08-25 NOTE — Telephone Encounter (Signed)
PA submitted for Oxycodone/APAP

## 2022-09-04 ENCOUNTER — Other Ambulatory Visit: Payer: Self-pay | Admitting: Family Medicine

## 2022-09-07 ENCOUNTER — Encounter: Payer: Self-pay | Admitting: Family Medicine

## 2022-09-07 ENCOUNTER — Telehealth (INDEPENDENT_AMBULATORY_CARE_PROVIDER_SITE_OTHER): Payer: Medicare Other | Admitting: Family Medicine

## 2022-09-07 ENCOUNTER — Other Ambulatory Visit: Payer: Self-pay | Admitting: Physical Medicine & Rehabilitation

## 2022-09-07 DIAGNOSIS — R3989 Other symptoms and signs involving the genitourinary system: Secondary | ICD-10-CM

## 2022-09-07 DIAGNOSIS — R300 Dysuria: Secondary | ICD-10-CM

## 2022-09-07 DIAGNOSIS — N898 Other specified noninflammatory disorders of vagina: Secondary | ICD-10-CM

## 2022-09-07 MED ORDER — FLUCONAZOLE 150 MG PO TABS: 150.0000 mg | ORAL_TABLET | Freq: Once | ORAL | 0 refills | Status: AC

## 2022-09-07 MED ORDER — CEPHALEXIN 500 MG PO CAPS: 500.0000 mg | ORAL_CAPSULE | Freq: Two times a day (BID) | ORAL | 0 refills | Status: AC

## 2022-09-07 NOTE — Progress Notes (Signed)
Virtual Visit via telephone Note Due to COVID-19 pandemic this visit was conducted virtually. This visit type was conducted due to national recommendations for restrictions regarding the COVID-19 Pandemic (e.g. social distancing, sheltering in place) in an effort to limit this patient's exposure and mitigate transmission in our community. All issues noted in this document were discussed and addressed.  A physical exam was not performed with this format.   I connected with Donna Rogers on 09/07/2022 at 1525 by telephone and verified that I am speaking with the correct person using two identifiers. Donna Rogers is currently located at home and patient is currently with them during visit. The provider, Monia Pouch, FNP is located in their office at time of visit.  I discussed the limitations, risks, security and privacy concerns of performing an evaluation and management service by virtual visit and the availability of in person appointments. I also discussed with the patient that there may be a patient responsible charge related to this service. The patient expressed understanding and agreed to proceed.  Subjective:  Patient ID: Donna Rogers, female    DOB: 06/08/1951, 72 y.o.   MRN: [id number]  Chief Complaint:  Dysuria and Vaginal Itching   HPI: Donna Rogers is a 72 y.o. female presenting on 09/07/2022 for Dysuria and Vaginal Itching   Pt reports 2 weeks of dysuria. Has been taking AZO without relief of symptoms. She has since developed vaginal itching and irritation. She denies fever, chills, weakness, confusion, or flank pain. She is a diabetic and states her blood sugars have been running high lately. She has tried increasing her water intake without resolution of symptoms.   Dysuria  This is a new problem. The current episode started 1 to 4 weeks ago. The problem occurs every urination. The problem has been waxing and waning. The quality of the pain is described as  burning (itching). The pain is mild. There has been no fever. She is Not sexually active. There is No history of pyelonephritis. Associated symptoms include frequency and urgency. Pertinent negatives include no chills, discharge, flank pain, hematuria, hesitancy, nausea, possible pregnancy, sweats or vomiting. She has tried increased fluids and home medications for the symptoms. The treatment provided no relief.  Vaginal Itching The patient's primary symptoms include genital itching. The patient's pertinent negatives include no pelvic pain or vaginal discharge. This is a new problem. The current episode started 1 to 4 weeks ago. The problem has been waxing and waning. The pain is mild. The problem affects both sides. She is not pregnant. Associated symptoms include discolored urine, dysuria, frequency and urgency. Pertinent negatives include no abdominal pain, anorexia, back pain, chills, constipation, diarrhea, fever, flank pain, headaches, hematuria, joint pain, joint swelling, nausea, painful intercourse, rash, sore throat or vomiting. The symptoms are aggravated by urinating. She has tried nothing for the symptoms. The treatment provided no relief. She is not sexually active.     Relevant past medical, surgical, family, and social history reviewed and updated as indicated.  Allergies and medications reviewed and updated.   Past Medical History:  Diagnosis Date   Allergy    seasonal allergies   Anemia    hx of   Arthritis    rheumatoid   Depression    on meds   Diabetes ([place])    Diabetic peripheral neuropathy (HCC)    GERD (gastroesophageal reflux disease)    on meds   Headache    Hyperlipidemia    on meds  Hyperplastic rectal polyp 10/21/2015   Hypertension    pt. denies   Lymphocytic colitis    Neuropathy    Rheumatoid arthritis ([place])    Shortness of breath dyspnea    with exertion    Past Surgical History:  Procedure Laterality Date   BREAST LUMPECTOMY Right 2018    BREAST LUMPECTOMY WITH RADIOACTIVE SEED LOCALIZATION Left 03/03/2016   Procedure: BREAST LUMPECTOMY WITH RADIOACTIVE SEED LOCALIZATION;  Surgeon: Coralie Keens, MD;  Location: [place];  Service: General;  Laterality: Left;   COLONOSCOPY  08/2002   RMR: internal hemorrhoids   COLONOSCOPY N/A 10/21/2015   Procedure: COLONOSCOPY;  Surgeon: Daneil Dolin, MD;  [facility];  Service: Endoscopy;  Laterality: N/A;  250 - moved to 2:35 - office to notify pt   CYST REMOVAL TRUNK     ESOPHAGOGASTRODUODENOSCOPY  08/2002   RMR: nonerosive reflux esophagitis, hiatal hernia   TONSILLECTOMY     WISDOM TOOTH EXTRACTION      Social History   Socioeconomic History   Marital status: Significant Other    Spouse name: Not on file   Number of children: 1   Years of education: 11th   Highest education level: 11th grade  Occupational History   Occupation: retired  Tobacco Use   Smoking status: Former    Packs/day: 1.00    Years: 46.00    Total pack years: 46.00    Types: Cigarettes   Smokeless tobacco: Never  Vaping Use   Vaping Use: Some days  Substance and Sexual Activity   Alcohol use: No    Alcohol/week: 0.0 standard drinks of alcohol   Drug use: No   Sexual activity: Not Currently  Other Topics Concern   Not on file  Social History Narrative   The patient is divorced she has 1 son who is married that she has a grandchild.   She does not use alcohol or drugs she drinks 3-4 caffeinated beverages daily   She is a smoker   She lives with her boyfriend   Social Determinants of Health   Financial Resource Strain: Medium Risk (04/15/2021)   Overall Financial Resource Strain (CARDIA)    Difficulty of Paying Living Expenses: Somewhat hard  Food Insecurity: No Food Insecurity (04/15/2021)   Hunger Vital Sign    Worried About Running Out of Food in the Last Year: Never true    [place] in the Last Year: Never true  Transportation Needs: No Transportation Needs (04/15/2021)    PRAPARE - Hydrologist (Medical): No    Lack of Transportation (Non-Medical): No  Physical Activity: Inactive (07/24/2021)   Exercise Vital Sign    Days of Exercise per Week: 0 days    Minutes of Exercise per Session: 0 min  Stress: Stress Concern Present (07/24/2021)   [place]    Feeling of Stress : To some extent  Social Connections: Moderately Isolated (04/15/2021)   Social Connection and Isolation Panel [NHANES]    Frequency of Communication with Friends and Family: More than three times a week    Frequency of Social Gatherings with Friends and Family: More than three times a week    Attends Religious Services: Never    Marine scientist or Organizations: No    Attends Archivist Meetings: Never    Marital Status: Living with partner  Intimate Partner Violence: Not At Risk (04/15/2021)   Humiliation, Afraid,  Rape, and Kick questionnaire    Fear of Current or Ex-Partner: No    Emotionally Abused: No    Physically Abused: No    Sexually Abused: No    Outpatient Encounter Medications as of 09/07/2022  Medication Sig   cephALEXin (KEFLEX) 500 MG capsule Take 1 capsule (500 mg total) by mouth 2 (two) times daily for 5 days.   fluconazole (DIFLUCAN) 150 MG tablet Take 1 tablet (150 mg total) by mouth once for 1 dose.   Alpha-Lipoic Acid 600 MG CAPS TAKE (1) CAPSULE DAILY FOR NERVE PAIN   Ascorbic Acid (VITAMIN C) 500 MG CHEW 1 tablet   aspirin 81 MG tablet Take 81 mg by mouth daily.   atenolol (TENORMIN) 25 MG tablet TAKE 1 TABLET BY MOUTH DAILY.   blood glucose meter kit and supplies Dispense based on patient and insurance preference. Use up to four times daily as directed. (FOR ICD-10 E10.9, E11.9).   buPROPion (WELLBUTRIN XL) 300 MG 24 hr tablet TAKE ONE TABLET ONCE DAILY   busPIRone (BUSPAR) 10 MG tablet TAKE (1) TABLET TWICE A DAY.   cholecalciferol (VITAMIN D) 1000 UNITS  tablet Take 1,000 Units by mouth daily.   citalopram (CELEXA) 40 MG tablet TAKE ONE TABLET ONCE DAILY   Cyanocobalamin (VITAMIN B 12 PO) Take by mouth.   dapagliflozin propanediol (FARXIGA) 10 MG TABS tablet Take 10 mg by mouth daily.   folic acid (FOLVITE) 1 MG tablet Take 1 mg by mouth daily.   glipiZIDE (GLUCOTROL XL) 5 MG 24 hr tablet TAKE ONE TABLET TWICE DAILY WITH MEAL(S)   glucose blood test strip Use as instructed to check sugar twice daily E11.9   Lancet Device MISC Check sugar 2 times daily E11.9   losartan (COZAAR) 100 MG tablet TAKE 1 TABLET DAILY   methotrexate (RHEUMATREX) 2.5 MG tablet Take 15 mg by mouth once a week.   omeprazole (PRILOSEC) 40 MG capsule Take 1 capsule (40 mg total) by mouth daily.   oxyCODONE-acetaminophen (PERCOCET/ROXICET) 5-325 MG tablet Take 1 tablet by mouth every 8 (eight) hours as needed for severe pain.   pregabalin (LYRICA) 150 MG capsule Take 1 capsule (150 mg total) by mouth 2 (two) times daily.   Pyridoxine HCl (VITAMIN B-6 PO) Take by mouth.   Semaglutide,0.25 or 0.'5MG'$ /DOS, (OZEMPIC, 0.25 OR 0.5 MG/DOSE,) 2 MG/3ML SOPN Inject 1 mg into the skin every 7 (seven) days.   simvastatin (ZOCOR) 40 MG tablet TAKE 1 TABLET DAILY   SULFASALAZINE PO Take by mouth.   Suvorexant (BELSOMRA) 10 MG TABS Take 10 mg by mouth at bedtime as needed.   tiZANidine (ZANAFLEX) 4 MG tablet Take 0.5-1 tablets (2-4 mg total) by mouth every 8 (eight) hours as needed for muscle spasms.   Facility-Administered Encounter Medications as of 09/07/2022  Medication   diclofenac Sodium (VOLTAREN) 1 % topical gel 2 g    Allergies  Allergen Reactions   Ciprofloxacin Hcl Rash   Enbrel [Etanercept] Rash   Metformin And Related Diarrhea    Review of Systems  Constitutional:  Negative for activity change, appetite change, chills, diaphoresis, fatigue, fever and unexpected weight change.  HENT:  Negative for congestion and sore throat.   Eyes:  Negative for photophobia and  visual disturbance.  Respiratory:  Negative for cough and shortness of breath.   Cardiovascular:  Negative for chest pain, palpitations and leg swelling.  Gastrointestinal:  Negative for abdominal distention, abdominal pain, anorexia, constipation, diarrhea, nausea and vomiting.  Genitourinary:  Positive for  dysuria, frequency, urgency and vaginal pain. Negative for decreased urine volume, difficulty urinating, enuresis, flank pain, genital sores, hematuria, hesitancy, pelvic pain, vaginal bleeding and vaginal discharge.  Musculoskeletal:  Negative for back pain and joint pain.  Skin:  Negative for rash.  Neurological:  Negative for dizziness, tremors, seizures, syncope, facial asymmetry, speech difficulty, weakness, light-headedness, numbness and headaches.  Psychiatric/Behavioral:  Negative for confusion.   All other systems reviewed and are negative.        Observations/Objective: No vital signs or physical exam, this was a virtual health encounter.  Pt alert and oriented, answers all questions appropriately, and able to speak in full sentences.    Assessment and Plan: Donna Rogers was seen today for dysuria and vaginal itching.  Diagnoses and all orders for this visit:  Dysuria Suspected UTI Reported symptoms consistent with UTI. Pt is unable to come to clinic and provide urine sample. No red flags concerning for acute pyelonephritis. Will initiate below for empiric treatment. Pt aware if symptoms persist or worsen, she will have to be seen in office.  -     cephALEXin (KEFLEX) 500 MG capsule; Take 1 capsule (500 mg total) by mouth 2 (two) times daily for 5 days.  Vaginal irritation Vaginal itching Ongoing irritation and pruritus, reports blood sugars have been running high. Concerns for vaginal yeast infection. No reports of discharge or bleeding. Will empirically treat with below. Aware if symptoms persist or worsen, she will need to be seen in office.  -     fluconazole (DIFLUCAN)  150 MG tablet; Take 1 tablet (150 mg total) by mouth once for 1 dose.     Follow Up Instructions: Return if symptoms worsen or fail to improve.    I discussed the assessment and treatment plan with the patient. The patient was provided an opportunity to ask questions and all were answered. The patient agreed with the plan and demonstrated an understanding of the instructions.   The patient was advised to call back or seek an in-person evaluation if the symptoms worsen or if the condition fails to improve as anticipated.  The above assessment and management plan was discussed with the patient. The patient verbalized understanding of and has agreed to the management plan. Patient is aware to call the clinic if they develop any new symptoms or if symptoms persist or worsen. Patient is aware when to return to the clinic for a follow-up visit. Patient educated on when it is appropriate to go to the emergency department.    I provided 15 minutes of time during this telephone encounter.   Monia Pouch, FNP-C [facility] [address] [place], [place] [address] [phone] 09/07/2022

## 2022-09-10 ENCOUNTER — Encounter: Payer: Self-pay | Admitting: Family Medicine

## 2022-09-14 ENCOUNTER — Other Ambulatory Visit: Payer: Self-pay | Admitting: Family Medicine

## 2022-09-14 DIAGNOSIS — K2100 Gastro-esophageal reflux disease with esophagitis, without bleeding: Secondary | ICD-10-CM

## 2022-09-14 DIAGNOSIS — F32A Depression, unspecified: Secondary | ICD-10-CM

## 2022-09-17 ENCOUNTER — Encounter: Payer: Medicare Other | Admitting: Physical Medicine & Rehabilitation

## 2022-09-18 NOTE — Telephone Encounter (Signed)
Already refilled

## 2022-09-23 ENCOUNTER — Telehealth (INDEPENDENT_AMBULATORY_CARE_PROVIDER_SITE_OTHER): Payer: Medicare Other | Admitting: Family Medicine

## 2022-09-23 ENCOUNTER — Encounter: Payer: Self-pay | Admitting: Family Medicine

## 2022-09-23 DIAGNOSIS — J40 Bronchitis, not specified as acute or chronic: Secondary | ICD-10-CM | POA: Diagnosis not present

## 2022-09-23 DIAGNOSIS — J329 Chronic sinusitis, unspecified: Secondary | ICD-10-CM

## 2022-09-23 MED ORDER — AMOXICILLIN-POT CLAVULANATE 875-125 MG PO TABS: 1.0000 | ORAL_TABLET | Freq: Two times a day (BID) | ORAL | 0 refills | Status: DC

## 2022-09-23 MED ORDER — HYDROCODONE BIT-HOMATROP MBR 5-1.5 MG/5ML PO SOLN: 5.0000 mL | Freq: Four times a day (QID) | ORAL | 0 refills | Status: AC | PRN

## 2022-09-23 NOTE — Progress Notes (Signed)
Subjective:    Patient ID: Donna Rogers, female    DOB: October 23, 1950,   sore throat. Cough with green sputum.  No relief from gel pills (benzonatate).  Onset was 2 days. No dyspnea. No fever. Appetite okay. Staying hydrated.       08/20/2022    1:02 PM 06/05/2022   11:01 AM 05/04/2022    4:47 PM 11/19/2021    2:36 PM 09/16/2021   10:48 AM  Depression screen PHQ 2/9  Decreased Interest 0 '3 2 1 2  '$ Down, Depressed, Hopeless 0 '3 3 3 3  '$ PHQ - 2 Score 0 '6 5 4 5  '$ Altered sleeping  '3 3 3 3  '$ Tired, decreased energy  '3 3 3 3  '$ Change in appetite  0 3 0 1  Feeling bad or failure about yourself   '3 3 3 3  '$ Trouble concentrating  0 1 1 0  Moving slowly or fidgety/restless  1 0 3 2  Suicidal thoughts  0 0 0 0  PHQ-9 Score  '16 18 17 17  '$ Difficult doing work/chores  Extremely dIfficult Extremely dIfficult Somewhat difficult Somewhat difficult     Relevant past medical, surgical, family and social history reviewed and updated as indicated.  Interim medical history since our last visit reviewed. Allergies and medications reviewed and updated.  ROS:  Review of Systems  Constitutional:  Negative for activity change, appetite change, chills and fever.  HENT:  Positive for congestion, postnasal drip, rhinorrhea and sore throat. Negative for ear discharge, ear pain, hearing loss, nosebleeds, sinus pressure, sneezing and trouble swallowing.   Respiratory:  Positive for cough. Negative for chest tightness and shortness of breath.   Cardiovascular:  Negative for chest pain and palpitations.  Skin:  Negative for rash.     Social History   Tobacco Use  Smoking Status Former   Packs/day: 1.00   Years: 46.00   Total pack years: 46.00   Types: Cigarettes  Smokeless Tobacco Never       Objective:     Wt Readings from Last 3 Encounters:  08/20/22 203 lb (92.1 kg)  08/05/22 204 lb 12.8 oz (92.9 kg)  06/05/22 202 lb 6.4 oz (91.8 kg)     Exam deferred. Pt. Harboring due to COVID 19.  Phone visit performed.   Assessment & Plan:   1. Sinobronchitis     Meds ordered this encounter  Medications   HYDROcodone bit-homatropine (HYCODAN) 5-1.5 MG/5ML syrup    Sig: Take 5 mLs by mouth every 6 (six) hours as needed for up to 5 days for cough.    Dispense:  100 mL    Refill:  0   amoxicillin-clavulanate (AUGMENTIN) 875-125 MG tablet    Sig: Take 1 tablet by mouth 2 (two) times daily. Take all of this medication    Dispense:  20 tablet    Refill:  0    No orders of the defined types were placed in this encounter.     Diagnoses and all orders for this visit:  Sinobronchitis  Other orders -     HYDROcodone bit-homatropine (HYCODAN) 5-1.5 MG/5ML syrup; Take 5 mLs by mouth every 6 (six) hours as needed for up to 5 days for cough. -     amoxicillin-clavulanate (AUGMENTIN) 875-125 MG tablet; Take 1 tablet by mouth 2 (two) times daily. Take all of this medication    Virtual Visit via telephone Note  I discussed the limitations, risks, security and privacy concerns of performing an  evaluation and management service by telephone and the availability of in person appointments. The patient was identified with two identifiers. Pt.expressed understanding and agreed to proceed. Pt. Is at home. Dr. Livia Snellen is in his office.  Follow Up Instructions:   I discussed the assessment and treatment plan with the patient. The patient was provided an opportunity to ask questions and all were answered. The patient agreed with the plan and demonstrated an understanding of the instructions.   The patient was advised to call back or seek an in-person evaluation if the symptoms worsen or if the condition fails to improve as anticipated.   Total minutes including chart review and phone contact time: 12   Follow up plan: Return if symptoms worsen or fail to improve.  Claretta Fraise, MD [place]

## 2022-09-24 ENCOUNTER — Encounter: Payer: Medicare Other | Admitting: Physical Medicine & Rehabilitation

## 2022-09-28 ENCOUNTER — Other Ambulatory Visit: Payer: Self-pay | Admitting: Family Medicine

## 2022-09-28 DIAGNOSIS — E1142 Type 2 diabetes mellitus with diabetic polyneuropathy: Secondary | ICD-10-CM

## 2022-10-01 ENCOUNTER — Encounter: Payer: Self-pay | Admitting: Physical Medicine & Rehabilitation

## 2022-10-01 ENCOUNTER — Encounter: Payer: Medicare Other | Attending: Physical Medicine & Rehabilitation | Admitting: Physical Medicine & Rehabilitation

## 2022-10-01 VITALS — BP 113/74 | HR 72 | Ht 61.0 in | Wt 200.0 lb

## 2022-10-01 DIAGNOSIS — G8929 Other chronic pain: Secondary | ICD-10-CM

## 2022-10-01 DIAGNOSIS — M545 Low back pain, unspecified: Secondary | ICD-10-CM | POA: Diagnosis not present

## 2022-10-01 DIAGNOSIS — M25511 Pain in right shoulder: Secondary | ICD-10-CM | POA: Diagnosis not present

## 2022-10-01 DIAGNOSIS — Z79891 Long term (current) use of opiate analgesic: Secondary | ICD-10-CM

## 2022-10-01 DIAGNOSIS — M069 Rheumatoid arthritis, unspecified: Secondary | ICD-10-CM | POA: Diagnosis not present

## 2022-10-01 DIAGNOSIS — E1142 Type 2 diabetes mellitus with diabetic polyneuropathy: Secondary | ICD-10-CM

## 2022-10-01 MED ORDER — PREGABALIN 200 MG PO CAPS: 200.0000 mg | ORAL_CAPSULE | Freq: Two times a day (BID) | ORAL | 3 refills | Status: DC

## 2022-10-01 NOTE — Progress Notes (Signed)
Subjective:    Patient ID: Donna Rogers, female    DOB: 15-Jun-1951, 72 y.o.   MRN: [id number]  HPI  HPI on 06/05/22 Donna Rogers is a 72 yo female with PMH of rheumatoid arthritis, chronic low back pain, diabetes mellitus type 2 with polyneuropathy, GERD, carpal tunnel right wrist, depression who is here for chronic pain.  Patient reports that she has been having burning and freezing pain in her feet that has been worsening over the past few years.  She says she feels like her feet are on ice.  Pain is worse on the dorsal and plantar aspects of her feet however she also has this pain over her ankles.  She was seen in the past by Dr. Nelva Bush for lumbar spine arthritis and reports that she had epidural steroid injections x2.  She reports the first injection helped a little bit but the second injection did not provide any relief.  She continues to have sharp and aching pain in her lower back and sometimes pain shoots into her thighs and legs.  Back pain is worsened with walking or activities.  Additionally she has severe pain in her right shoulder.  She reports shoulder injection did not help the pain.  Shoulder pain is worsened with movement.  She additionally has pain in both of her hips hands, knees.  Pain makes it difficult to sleep.  She is followed by rheumatology by Dr. Leigh Aurora and Leafy Kindle and is currently treated with methotrexate.  She says that she was was previously on Enbrel.   Prior treatments Tylenol and ibuprofen in past helped slightly Diclofenac oral did not help Blu Emu cream- helps a little Gabapentin 1249m BID to TID with mild to moderate improvement She has not tried Lyrica Hydrocodone helped in the past, it stopped working  She has not tried oxycodone Tizanidine did not help   Interval History PBernett Raysoris here for follow-up due to her chronic pain.  She reports that her polyneuropathy is much improved since starting Lyrica.  She feels that this  works much better than the gabapentin.  She says she would like to try Qutenza however cost is prohibitive at this time.  She continues to have lower back pain and shoulder pain bilaterally that limits her activities.  The lower back pain did not improve significantly with the change to Lyrica.  She has not had any side effects with this medication.  She reports she previously tried tramadol and it initially helped mildly but later did not provide significant benefit.  She will occasionally get a tingling sensation in the fourth and fifth digits on both hands but this is not present currently.  She feels like she has poor balance overall however has not had any recent falls.  She does not use a cane for ambulation at this time but she has been home.   Interval History 10/01/22 Donna Rogers here for follow-up regarding her chronic pain.  She reports that oxycodone is helping to control her lower back pain.  She uses this medication sparingly only when the pain is very severe.  The medication allows her to do more active activities and tasks at her house.  She continues to take Lyrica for burning sensation in her feet.  She does feel like the medication is not helping as much as it did when we spoke at her last visit.  Lyrica does not currently give her the drunk feeling she was getting when she is taking  gabapentin.  She denies any history of CKD or known history of kidney dysfunction.  She continues to have right shoulder pain.  She says that she would like to repeat the right shoulder injection.  She previously had this completed by her orthopedic doctor.  She asked if we can completed at her next visit if possible.   Pain Inventory Average Pain 8 Pain Right Now 7 My pain is constant and dull  In the last 24 hours, has pain interfered with the following? General activity 10 Relation with others 0 Enjoyment of life 10 What TIME of day is your pain at its worst? night Sleep (in general)  Poor  Pain is worse with: walking, bending, sitting, and some activites Pain improves with:  N/A Relief from Meds: 5  Family History  Problem Relation Age of Onset   Alzheimer's disease Mother    Lung cancer Mother 9   Diabetes Father    Heart disease Father    Arthritis Sister    Diabetes Sister    Diabetes Brother    Hypertension Brother    Colon cancer Neg Hx    Stomach cancer Neg Hx    Colon polyps Neg Hx    Esophageal cancer Neg Hx    Rectal cancer Neg Hx    Social History   Socioeconomic History   Marital status: Significant Other    Spouse name: Not on file   Number of children: 1   Years of education: 11th   Highest education level: 11th grade  Occupational History   Occupation: retired  Tobacco Use   Smoking status: Former    Packs/day: 1.00    Years: 46.00    Total pack years: 46.00    Types: Cigarettes   Smokeless tobacco: Never  Vaping Use   Vaping Use: Some days  Substance and Sexual Activity   Alcohol use: No    Alcohol/week: 0.0 standard drinks of alcohol   Drug use: No   Sexual activity: Not Currently  Other Topics Concern   Not on file  Social History Narrative   The patient is divorced she has 1 son who is married that she has a grandchild.   She does not use alcohol or drugs she drinks 3-4 caffeinated beverages daily   She is a smoker   She lives with her boyfriend   Social Determinants of Health   Financial Resource Strain: Medium Risk (04/15/2021)   Overall Financial Resource Strain (CARDIA)    Difficulty of Paying Living Expenses: Somewhat hard  Food Insecurity: No Food Insecurity (04/15/2021)   Hunger Vital Sign    Worried About Running Out of Food in the Last Year: Never true    [place] in the Last Year: Never true  Transportation Needs: No Transportation Needs (04/15/2021)   PRAPARE - Hydrologist (Medical): No    Lack of Transportation (Non-Medical): No  Physical Activity: Inactive  (07/24/2021)   Exercise Vital Sign    Days of Exercise per Week: 0 days    Minutes of Exercise per Session: 0 min  Stress: Stress Concern Present (07/24/2021)   [place]    Feeling of Stress : To some extent  Social Connections: Moderately Isolated (04/15/2021)   Social Connection and Isolation Panel [NHANES]    Frequency of Communication with Friends and Family: More than three times a week    Frequency of Social Gatherings with Friends and Family:  More than three times a week    Attends Religious Services: Never    Active Member of Clubs or Organizations: No    Attends Archivist Meetings: Never    Marital Status: Living with partner   Past Surgical History:  Procedure Laterality Date   BREAST LUMPECTOMY Right 2018   BREAST LUMPECTOMY WITH RADIOACTIVE SEED LOCALIZATION Left 03/03/2016   Procedure: BREAST LUMPECTOMY WITH RADIOACTIVE SEED LOCALIZATION;  Surgeon: Coralie Keens, MD;  Location: [place];  Service: General;  Laterality: Left;   COLONOSCOPY  08/2002   RMR: internal hemorrhoids   COLONOSCOPY N/A 10/21/2015   Procedure: COLONOSCOPY;  Surgeon: Daneil Dolin, MD;  [facility];  Service: Endoscopy;  Laterality: N/A;  250 - moved to 2:35 - office to notify pt   CYST REMOVAL TRUNK     ESOPHAGOGASTRODUODENOSCOPY  08/2002   RMR: nonerosive reflux esophagitis, hiatal hernia   TONSILLECTOMY     WISDOM TOOTH EXTRACTION     Past Surgical History:  Procedure Laterality Date   BREAST LUMPECTOMY Right 2018   BREAST LUMPECTOMY WITH RADIOACTIVE SEED LOCALIZATION Left 03/03/2016   Procedure: BREAST LUMPECTOMY WITH RADIOACTIVE SEED LOCALIZATION;  Surgeon: Coralie Keens, MD;  Location: [place];  Service: General;  Laterality: Left;   COLONOSCOPY  08/2002   RMR: internal hemorrhoids   COLONOSCOPY N/A 10/21/2015   Procedure: COLONOSCOPY;  Surgeon: Daneil Dolin, MD;  [facility];  Service:  Endoscopy;  Laterality: N/A;  250 - moved to 2:35 - office to notify pt   CYST REMOVAL TRUNK     ESOPHAGOGASTRODUODENOSCOPY  08/2002   RMR: nonerosive reflux esophagitis, hiatal hernia   TONSILLECTOMY     WISDOM TOOTH EXTRACTION     Past Medical History:  Diagnosis Date   Allergy    seasonal allergies   Anemia    hx of   Arthritis    rheumatoid   Depression    on meds   Diabetes ([place])    Diabetic peripheral neuropathy ([facility])    GERD (gastroesophageal reflux disease)    on meds   Headache    Hyperlipidemia    on meds   Hyperplastic rectal polyp 10/21/2015   Hypertension    pt. denies   Lymphocytic colitis    Neuropathy    Rheumatoid arthritis ([place])    Shortness of breath dyspnea    with exertion   Ht 5' 1"$  (1.549 m)   BMI 38.36 kg/m   Opioid Risk Score:   Fall Risk Score:  `1  Depression screen [facility] 2/9     08/20/2022    1:02 PM 06/05/2022   11:01 AM 05/04/2022    4:47 PM 11/19/2021    2:36 PM 09/16/2021   10:48 AM 07/24/2021    9:46 AM 04/04/2021   11:32 AM  Depression screen PHQ 2/9  Decreased Interest 0 3 2 1 2 1 1  $ Down, Depressed, Hopeless 0 3 3 3 3 1 1  $ PHQ - 2 Score 0 6 5 4 5 2 2  $ Altered sleeping  3 3 3 3 1 3  $ Tired, decreased energy  3 3 3 3 2 3  $ Change in appetite  0 3 0 1 0 0  Feeling bad or failure about yourself   3 3 3 3 1 1  $ Trouble concentrating  0 1 1 0 1 0  Moving slowly or fidgety/restless  1 0 3 2 1 3  $ Suicidal thoughts  0 0 0 0 0 0  PHQ-9 Score  16 18 17 17 8 12  $ Difficult doing work/chores  Extremely dIfficult Extremely dIfficult Somewhat difficult Somewhat difficult Somewhat difficult Somewhat difficult      Review of Systems  Musculoskeletal:  Positive for back pain and gait problem.      Objective:   Physical Exam  Physical Exam Gen: no distress, normal appearing HEENT: oral mucosa pink and moist, NCAT Chest: normal effort, normal rate of breathing Abd: soft, non-distended Ext: no edema Psych: pleasant, appears sad when  talking about her pain Skin: intact Neuro: CN 2-12 grossly intact, follows commands Strength 5/5 in all 4 extremities with exception of R shoulder 4+/5 limited by pain Decreased sensation to LT in b/l LE below the knee Mild tenderness reported at b/l Knee joint line Musculoskeletal: Decreased ROM R shoulder able to abduct to about 90 degrees  Pain with movement in all directions of her right shoulder Mild L shoulder tenderenss Paraspinal tenderness C spine T spine and L spine Slump test negative  Back pain with internal and external rotation of her b/l hips       Lumbar xray 08/26/17 FINDINGS: Five lumbar type vertebral bodies are well visualized. Vertebral body height is well maintained. Multilevel disc space narrowing is noted throughout the lumbar spine progressed in the interval from the prior exam. Osteophytic changes are noted as well. No anterolisthesis is seen. A somewhat rounded calcification is noted in the right upper quadrant which may be related to cholelithiasis. Clinical correlation is recommended.   IMPRESSION: Multilevel degenerative changes progressed when compared with the prior exam of 20/8.   Rounded calcification in the right upper quadrant may represent cholelithiasis.       MRI shoulder 08/22/21 IMPRESSION: 1. Full-thickness, full width tears of the supraspinatus and infraspinatus tendons with retraction to the glenoid. 2. Full-thickness subscapularis tear involving the mid to superior fibers with tendon gap up to 1.0 cm, and interstitial extension proximally. 3. Grade 2 muscle atrophy of the supraspinatus, infraspinatus, and subscapularis muscles. 4. Moderate glenohumeral and acromioclavicular joint osteoarthritis. 5. Likely torn and retracted long head biceps tendon. 6. Degenerative posterosuperior labral tearing.       Assessment & Plan:   DM with polyneuropathy -Consider Qutenza, currently patient would like to hold off due to  cost -Increase Lyrica to 271m BID -Intermittent numbness in her fourth and fifth digit, Possibly some ulnar nerve irritation, Continue to monitor. -We discussed PT for balance previously.  She will consider PT and let me know at a later time.   Chronic lower back pain, lumbar spondylosis -UDS and pain agreement completed prior visit -continue oxycodone [id number]     Right shoulder pain with biceps SS, IS and Subscapularis tears, GH and AC arthritis  -Continue medications as above -Voltaren gel  -Consider shoulder injection next visit, she reports benefit from subacromial injection 1 year ago. Provided name and number of her prior orthopedic doctor.  If she decides to go to orthopedics for this procedure I will see her in clinic next week as a follow-up instead.  Rheumatoid arthritis and polyarthralgia -Continue f/u with rheumatology

## 2022-10-08 ENCOUNTER — Encounter: Payer: Self-pay | Admitting: Family Medicine

## 2022-10-08 ENCOUNTER — Telehealth (INDEPENDENT_AMBULATORY_CARE_PROVIDER_SITE_OTHER): Payer: Medicare Other | Admitting: Family Medicine

## 2022-10-08 DIAGNOSIS — R300 Dysuria: Secondary | ICD-10-CM | POA: Diagnosis not present

## 2022-10-08 DIAGNOSIS — N898 Other specified noninflammatory disorders of vagina: Secondary | ICD-10-CM

## 2022-10-08 DIAGNOSIS — B3731 Acute candidiasis of vulva and vagina: Secondary | ICD-10-CM

## 2022-10-08 LAB — MICROSCOPIC EXAMINATION: Renal Epithel, UA: NONE SEEN /hpf

## 2022-10-08 LAB — URINALYSIS, ROUTINE W REFLEX MICROSCOPIC
Bilirubin, UA: NEGATIVE
Ketones, UA: NEGATIVE
Nitrite, UA: NEGATIVE
Protein,UA: NEGATIVE
Specific Gravity, UA: 1.01 (ref 1.005–1.030)
Urobilinogen, Ur: 0.2 mg/dL (ref 0.2–1.0)
pH, UA: 5.5 (ref 5.0–7.5)

## 2022-10-08 LAB — WET PREP FOR TRICH, YEAST, CLUE
Clue Cell Exam: NEGATIVE
Trichomonas Exam: NEGATIVE
Yeast Exam: POSITIVE — AB

## 2022-10-08 MED ORDER — FLUCONAZOLE 150 MG PO TABS: ORAL_TABLET | ORAL | 0 refills | Status: DC

## 2022-10-08 NOTE — Progress Notes (Signed)
Virtual Visit via telephone Note Due to COVID-19 pandemic this visit was conducted virtually. This visit type was conducted due to national recommendations for restrictions regarding the COVID-19 Pandemic (e.g. social distancing, sheltering in place) in an effort to limit this patient's exposure and mitigate transmission in our community. All issues noted in this document were discussed and addressed.  A physical exam was not performed with this format.   I connected with Donna Rogers on 10/08/2022 at 1150 by telephone and verified that I am speaking with the correct person using two identifiers. Donna Rogers is currently located at home and patient is currently with them during visit. The provider, Monia Pouch, FNP is located in their office at time of visit.  I discussed the limitations, risks, security and privacy concerns of performing an evaluation and management service by virtual visit and the availability of in person appointments. I also discussed with the patient that there may be a patient responsible charge related to this service. The patient expressed understanding and agreed to proceed.  Subjective:  Patient ID: Donna Rogers, female    DOB: Jul 29, 1951, 72 y.o.   MRN: [id number]  Chief Complaint:  Vaginal Itching   HPI: Donna Rogers is a 72 y.o. female presenting on 10/08/2022 for Vaginal Itching   Pt reports ongoing vaginal itching and dysuria. She was treated for a vaginal yeast infection and UTI 09/07/2022. States symptoms resolved for a few days and then returned. She is a diabetic and last A1C was above 10. She states her blood sugars continue to be elevated.   Vaginal Itching The patient's primary symptoms include genital itching and vaginal discharge. The patient's pertinent negatives include no genital lesions, genital odor, genital rash, pelvic pain or vaginal bleeding. This is a recurrent problem. The current episode started more than 1 month ago.  The pain is moderate. Associated symptoms include discolored urine, dysuria, frequency and urgency. Pertinent negatives include no abdominal pain, anorexia, back pain, chills, constipation, diarrhea, fever, flank pain, headaches, hematuria, joint pain, joint swelling, nausea, rash, sore throat or vomiting. The vaginal discharge was thick and white. There has been no bleeding.     Relevant past medical, surgical, family, and social history reviewed and updated as indicated.  Allergies and medications reviewed and updated.   Past Medical History:  Diagnosis Date   Allergy    seasonal allergies   Anemia    hx of   Arthritis    rheumatoid   Depression    on meds   Diabetes ([place])    Diabetic peripheral neuropathy (HCC)    GERD (gastroesophageal reflux disease)    on meds   Headache    Hyperlipidemia    on meds   Hyperplastic rectal polyp 10/21/2015   Hypertension    pt. denies   Lymphocytic colitis    Neuropathy    Rheumatoid arthritis ([place])    Shortness of breath dyspnea    with exertion    Past Surgical History:  Procedure Laterality Date   BREAST LUMPECTOMY Right 2018   BREAST LUMPECTOMY WITH RADIOACTIVE SEED LOCALIZATION Left 03/03/2016   Procedure: BREAST LUMPECTOMY WITH RADIOACTIVE SEED LOCALIZATION;  Surgeon: Coralie Keens, MD;  Location: [facility];  Service: General;  Laterality: Left;   COLONOSCOPY  08/2002   RMR: internal hemorrhoids   COLONOSCOPY N/A 10/21/2015   Procedure: COLONOSCOPY;  Surgeon: Daneil Dolin, MD;  [facility];  Service: Endoscopy;  Laterality: N/A;  250 - moved to 2:35 -  office to notify pt   CYST REMOVAL TRUNK     ESOPHAGOGASTRODUODENOSCOPY  08/2002   RMR: nonerosive reflux esophagitis, hiatal hernia   TONSILLECTOMY     WISDOM TOOTH EXTRACTION      Social History   Socioeconomic History   Marital status: Significant Other    Spouse name: Not on file   Number of children: 1   Years of education: 11th   Highest education level:  11th grade  Occupational History   Occupation: retired  Tobacco Use   Smoking status: Former    Packs/day: 1.00    Years: 46.00    Total pack years: 46.00    Types: Cigarettes   Smokeless tobacco: Never  Vaping Use   Vaping Use: Some days  Substance and Sexual Activity   Alcohol use: No    Alcohol/week: 0.0 standard drinks of alcohol   Drug use: No   Sexual activity: Not Currently  Other Topics Concern   Not on file  Social History Narrative   The patient is divorced she has 1 son who is married that she has a grandchild.   She does not use alcohol or drugs she drinks 3-4 caffeinated beverages daily   She is a smoker   She lives with her boyfriend   Social Determinants of Health   Financial Resource Strain: Medium Risk (04/15/2021)   Overall Financial Resource Strain (CARDIA)    Difficulty of Paying Living Expenses: Somewhat hard  Food Insecurity: No Food Insecurity (04/15/2021)   Hunger Vital Sign    Worried About Running Out of Food in the Last Year: Never true    [place] in the Last Year: Never true  Transportation Needs: No Transportation Needs (04/15/2021)   PRAPARE - Hydrologist (Medical): No    Lack of Transportation (Non-Medical): No  Physical Activity: Inactive (07/24/2021)   Exercise Vital Sign    Days of Exercise per Week: 0 days    Minutes of Exercise per Session: 0 min  Stress: Stress Concern Present (07/24/2021)   [place]    Feeling of Stress : To some extent  Social Connections: Moderately Isolated (04/15/2021)   Social Connection and Isolation Panel [NHANES]    Frequency of Communication with Friends and Family: More than three times a week    Frequency of Social Gatherings with Friends and Family: More than three times a week    Attends Religious Services: Never    Marine scientist or Organizations: No    Attends Archivist Meetings:  Never    Marital Status: Living with partner  Intimate Partner Violence: Not At Risk (04/15/2021)   Humiliation, Afraid, Rape, and Kick questionnaire    Fear of Current or Ex-Partner: No    Emotionally Abused: No    Physically Abused: No    Sexually Abused: No    Outpatient Encounter Medications as of 10/08/2022  Medication Sig   fluconazole (DIFLUCAN) 150 MG tablet 1 po q week x 4 weeks   Accu-Chek Softclix Lancets lancets USE TO check blood sugar TWICE DAILY   Alpha-Lipoic Acid 600 MG CAPS TAKE (1) CAPSULE DAILY FOR NERVE PAIN   amoxicillin-clavulanate (AUGMENTIN) 875-125 MG tablet Take 1 tablet by mouth 2 (two) times daily. Take all of this medication   Ascorbic Acid (VITAMIN C) 500 MG CHEW 1 tablet   aspirin 81 MG tablet Take 81 mg by mouth daily.   atenolol (  TENORMIN) 25 MG tablet TAKE 1 TABLET BY MOUTH DAILY.   blood glucose meter kit and supplies Dispense based on patient and insurance preference. Use up to four times daily as directed. (FOR ICD-10 E10.9, E11.9).   buPROPion (WELLBUTRIN XL) 300 MG 24 hr tablet TAKE ONE TABLET ONCE DAILY   busPIRone (BUSPAR) 10 MG tablet TAKE (1) TABLET TWICE A DAY.   cholecalciferol (VITAMIN D) 1000 UNITS tablet Take 1,000 Units by mouth daily.   citalopram (CELEXA) 40 MG tablet TAKE ONE TABLET ONCE DAILY   Cyanocobalamin (VITAMIN B 12 PO) Take by mouth.   dapagliflozin propanediol (FARXIGA) 10 MG TABS tablet Take 10 mg by mouth daily.   folic acid (FOLVITE) 1 MG tablet Take 1 mg by mouth daily.   glipiZIDE (GLUCOTROL XL) 5 MG 24 hr tablet TAKE ONE TABLET TWICE DAILY WITH MEAL(S)   glucose blood test strip Use as instructed to check sugar twice daily E11.9   Lancet Device MISC Check sugar 2 times daily E11.9   losartan (COZAAR) 100 MG tablet TAKE 1 TABLET DAILY   methotrexate (RHEUMATREX) 2.5 MG tablet 6 tablets Orally once weekly on the same day for 90 days   omeprazole (PRILOSEC) 40 MG capsule TAKE 1 CAPSULE DAILY   oxyCODONE-acetaminophen  (PERCOCET/ROXICET) 5-325 MG tablet Take 1 tablet by mouth every 8 (eight) hours as needed for severe pain.   pregabalin (LYRICA) 200 MG capsule Take 1 capsule (200 mg total) by mouth 2 (two) times daily.   Pyridoxine HCl (VITAMIN B-6 PO) Take by mouth.   Semaglutide,0.25 or 0.5MG/DOS, (OZEMPIC, 0.25 OR 0.5 MG/DOSE,) 2 MG/3ML SOPN Inject 1 mg into the skin every 7 (seven) days.   simvastatin (ZOCOR) 40 MG tablet TAKE 1 TABLET DAILY   sulfaSALAzine (AZULFIDINE) 500 MG tablet Take 500 mg by mouth 2 (two) times daily.   Suvorexant (BELSOMRA) 10 MG TABS Take 10 mg by mouth at bedtime as needed.   tiZANidine (ZANAFLEX) 4 MG tablet Take 0.5-1 tablets (2-4 mg total) by mouth every 8 (eight) hours as needed for muscle spasms.   [DISCONTINUED] fluconazole (DIFLUCAN) 150 MG tablet Take 150 mg by mouth once.   Facility-Administered Encounter Medications as of 10/08/2022  Medication   diclofenac Sodium (VOLTAREN) 1 % topical gel 2 g    Allergies  Allergen Reactions   Ciprofloxacin Hcl Rash   Enbrel [Etanercept] Rash   Metformin And Related Diarrhea    Review of Systems  Constitutional:  Negative for activity change, appetite change, chills, diaphoresis, fatigue, fever and unexpected weight change.  HENT:  Negative for sore throat.   Gastrointestinal:  Negative for abdominal pain, anorexia, constipation, diarrhea, nausea and vomiting.  Endocrine: Positive for polyuria. Negative for polydipsia and polyphagia.  Genitourinary:  Positive for dysuria, frequency, urgency and vaginal discharge. Negative for decreased urine volume, difficulty urinating, enuresis, flank pain, genital sores, hematuria, pelvic pain, vaginal bleeding and vaginal pain.  Musculoskeletal:  Negative for back pain and joint pain.  Skin:  Negative for rash.  Neurological:  Negative for weakness and headaches.  Psychiatric/Behavioral:  Negative for confusion.   All other systems reviewed and are negative.         Observations/Objective: No vital signs or physical exam, this was a virtual health encounter.  Pt alert and oriented, answers all questions appropriately, and able to speak in full sentences.    Assessment and Plan: Annecia was seen today for vaginal itching.  Diagnoses and all orders for this visit:  Dysuria Vagina itching Urinalysis  positive for blood and glucose, trace leukocytes. Wet prep positive for yeast. Urine culture added, will treat if warranted.  -     Urinalysis, Routine w reflex microscopic -     WET PREP FOR TRICH, YEAST, CLUE -     Microscopic Examination -     Urine Culture  Vaginal candidiasis Recurrent, likely due to uncontrolled diabetes. Pt to follow up with PCP for further management. Will treat with below, aware to hold statin therapy while taking below.  -     fluconazole (DIFLUCAN) 150 MG tablet; 1 po q week x 4 weeks  Discussed in detail the importance of better blood sugar control to prevent recurrent yeast infections. Aware to make an in office visit with PCP for further management of DM. Aware to report new, worsening, or persistent symptoms.    Follow Up Instructions: Return if symptoms worsen or fail to improve.    I discussed the assessment and treatment plan with the patient. The patient was provided an opportunity to ask questions and all were answered. The patient agreed with the plan and demonstrated an understanding of the instructions.   The patient was advised to call back or seek an in-person evaluation if the symptoms worsen or if the condition fails to improve as anticipated.  The above assessment and management plan was discussed with the patient. The patient verbalized understanding of and has agreed to the management plan. Patient is aware to call the clinic if they develop any new symptoms or if symptoms persist or worsen. Patient is aware when to return to the clinic for a follow-up visit. Patient educated on when it is  appropriate to go to the emergency department.    I provided 15 minutes of time during this telephone encounter.   Monia Pouch, FNP-C [facility] [address] [place], [place] [address] [phone] 10/08/2022

## 2022-10-11 LAB — URINE CULTURE

## 2022-10-12 ENCOUNTER — Telehealth: Payer: Self-pay | Admitting: Family Medicine

## 2022-10-12 MED ORDER — DAPAGLIFLOZIN PROPANEDIOL 10 MG PO TABS: 10.0000 mg | ORAL_TABLET | Freq: Every day | ORAL | 3 refills | Status: DC

## 2022-10-12 NOTE — Telephone Encounter (Signed)
Fyi below

## 2022-10-12 NOTE — Telephone Encounter (Signed)
Pt needs samples for dapagliflozin propanediol (FARXIGA) 10 MG TABS tablet. Pt has about a week left. Pt needs refills on Semaglutide,0.25 or 0.'5MG'$ /DOS, (OZEMPIC, 0.25 OR 0.5 MG/DOSE,) 2 MG/3ML SOPN  Use [facility]. Pt aware Almyra Free is off today. Please call back

## 2022-10-12 NOTE — Telephone Encounter (Signed)
Farxiga sent. Ozempic renewed in 07/2022 for 1 year supply. Should be able to contact pharmacy for refills on that one.

## 2022-10-15 NOTE — Telephone Encounter (Signed)
Patient calls in and states Farxiga cost over $400 for #30 and #90 is $500 and some. Almyra Free usually helps patient with that medication. Ozempic shows printed on 12-23 instead of going to pharmacy and Almyra Free helps her with this one also. Patient never got Ozempic @ pharmacy because it cost so much.

## 2022-10-19 ENCOUNTER — Other Ambulatory Visit: Payer: Self-pay | Admitting: Family Medicine

## 2022-10-20 ENCOUNTER — Telehealth: Payer: Self-pay | Admitting: Pharmacist

## 2022-10-20 DIAGNOSIS — E1169 Type 2 diabetes mellitus with other specified complication: Secondary | ICD-10-CM

## 2022-10-20 NOTE — Telephone Encounter (Signed)
    10/20/2022 Name: Donna Rogers MRN: [id number] DOB: [birth year]   Patient reports she has been without medications ([facility]) due to cost.  [phone] replaced to pharmacy for med assistance and diabetes management.  Farxiga escribed to [facility] (pharmacy for [place] patient assistance).    Rosendo Gros, CPhT to email/fax PharmD apps to [facility] (ozempic/farxiga)    Regina Eck, PharmD, [place] Clinical Pharmacist, [facility]

## 2022-10-21 ENCOUNTER — Telehealth: Payer: Self-pay

## 2022-10-21 ENCOUNTER — Telehealth: Payer: Self-pay | Admitting: Family Medicine

## 2022-10-21 NOTE — Progress Notes (Signed)
  Care Coordination  Note  10/21/2022 Name: Donna Rogers MRN: [id number] DOB: Apr 19, 1951  Donna Rogers is a 72 y.o. year old female who is a primary care patient of [name], DO. I reached out to [name] by phone today to offer care coordination services.      [name] was given information about Care Coordination services today including:  The Care Coordination services include support from the care team which includes your Nurse Coordinator, Clinical Social Worker, or Pharmacist.  The Care Coordination team is here to help remove barriers to the health concerns and goals most important to you. Care Coordination services are voluntary and the patient may decline or stop services at any time by request to their care team member.   Patient agreed to services and verbal consent obtained.   Follow up plan: Telephone appointment with care coordination team member scheduled for:11/17/2022  Noreene Larsson, [place], [place] [address] Direct Dial: [phone] [email or web address]

## 2022-10-21 NOTE — Telephone Encounter (Signed)
Patient wants to know if we have samples of Farxiga '10mg'$  that we could give her?

## 2022-10-22 ENCOUNTER — Other Ambulatory Visit: Payer: Self-pay | Admitting: Family Medicine

## 2022-10-22 NOTE — Telephone Encounter (Signed)
Pt has been notified.

## 2022-10-28 ENCOUNTER — Telehealth: Payer: Self-pay | Admitting: *Deleted

## 2022-10-28 ENCOUNTER — Other Ambulatory Visit: Payer: Self-pay | Admitting: Family Medicine

## 2022-10-28 DIAGNOSIS — I10 Essential (primary) hypertension: Secondary | ICD-10-CM

## 2022-10-28 MED ORDER — OXYCODONE-ACETAMINOPHEN 5-325 MG PO TABS: 1.0000 | ORAL_TABLET | Freq: Three times a day (TID) | ORAL | 0 refills | Status: DC | PRN

## 2022-10-28 NOTE — Telephone Encounter (Signed)
PMP was reviewed. DR Marciano Sequin note was reviewed.

## 2022-10-28 NOTE — Telephone Encounter (Signed)
Due to Dr. Curlene Dolphin being on vacation this week can you please refill patient's Oxycodone [place]. Her last refill was 08/20/22 # 90. Last UDS 06/05/22 normal

## 2022-11-04 ENCOUNTER — Telehealth: Payer: Self-pay

## 2022-11-04 NOTE — Telephone Encounter (Signed)
Patient informed via voicemail that we have received Ozempic and it will be placed in the refrigerator for her to pick up.

## 2022-11-04 NOTE — Telephone Encounter (Signed)
Received notification from [facility] regarding approval for RYBELSUS. Patient assistance approved from 10/27/22 to 08/17/23.  Medication will ship to office  Phone: [phone]

## 2022-11-09 ENCOUNTER — Other Ambulatory Visit: Payer: Self-pay | Admitting: Family Medicine

## 2022-11-16 ENCOUNTER — Encounter: Payer: Medicare Other | Attending: Physical Medicine & Rehabilitation | Admitting: Physical Medicine & Rehabilitation

## 2022-11-16 ENCOUNTER — Other Ambulatory Visit: Payer: Self-pay | Admitting: Acute Care

## 2022-11-16 ENCOUNTER — Encounter: Payer: Self-pay | Admitting: Physical Medicine & Rehabilitation

## 2022-11-16 VITALS — BP 147/81 | HR 72 | Ht 61.0 in | Wt 212.0 lb

## 2022-11-16 DIAGNOSIS — Z79891 Long term (current) use of opiate analgesic: Secondary | ICD-10-CM | POA: Diagnosis not present

## 2022-11-16 DIAGNOSIS — Z87891 Personal history of nicotine dependence: Secondary | ICD-10-CM

## 2022-11-16 DIAGNOSIS — G894 Chronic pain syndrome: Secondary | ICD-10-CM

## 2022-11-16 DIAGNOSIS — M25511 Pain in right shoulder: Secondary | ICD-10-CM

## 2022-11-16 DIAGNOSIS — M545 Low back pain, unspecified: Secondary | ICD-10-CM | POA: Diagnosis not present

## 2022-11-16 DIAGNOSIS — E1142 Type 2 diabetes mellitus with diabetic polyneuropathy: Secondary | ICD-10-CM

## 2022-11-16 DIAGNOSIS — Z79899 Other long term (current) drug therapy: Secondary | ICD-10-CM | POA: Diagnosis not present

## 2022-11-16 DIAGNOSIS — M069 Rheumatoid arthritis, unspecified: Secondary | ICD-10-CM | POA: Diagnosis not present

## 2022-11-16 DIAGNOSIS — Z122 Encounter for screening for malignant neoplasm of respiratory organs: Secondary | ICD-10-CM

## 2022-11-16 DIAGNOSIS — G8929 Other chronic pain: Secondary | ICD-10-CM | POA: Insufficient documentation

## 2022-11-16 DIAGNOSIS — M0579 Rheumatoid arthritis with rheumatoid factor of multiple sites without organ or systems involvement: Secondary | ICD-10-CM | POA: Diagnosis not present

## 2022-11-16 MED ORDER — BETAMETHASONE SOD PHOS & ACET 6 (3-3) MG/ML IJ SUSP
6.0000 mg | Freq: Once | INTRAMUSCULAR | Status: AC
  Administered 2022-11-16: 6 mg via INTRAMUSCULAR

## 2022-11-16 MED ORDER — LIDOCAINE HCL 1 % IJ SOLN
3.0000 mL | Freq: Once | INTRAMUSCULAR | Status: AC
  Administered 2022-11-16: 3 mL

## 2022-11-16 MED ORDER — OXYCODONE-ACETAMINOPHEN 7.5-325 MG PO TABS: 1.0000 | ORAL_TABLET | Freq: Three times a day (TID) | ORAL | 0 refills | Status: DC | PRN

## 2022-11-16 NOTE — Progress Notes (Addendum)
Subjective:    Patient ID: Donna Rogers, female    DOB: 23-Mar-1951, 72 y.o.   MRN: [id number]  HPI  HPI on 06/05/22 Donna Rogers is a 72 yo female with PMH of rheumatoid arthritis, chronic low back pain, diabetes mellitus type 2 with polyneuropathy, GERD, carpal tunnel right wrist, depression who is here for chronic pain.  Patient reports that she has been having burning and freezing pain in her feet that has been worsening over the past few years.  She says she feels like her feet are on ice.  Pain is worse on the dorsal and plantar aspects of her feet however she also has this pain over her ankles.  She was seen in the past by Dr. Ethelene Hal for lumbar spine arthritis and reports that she had epidural steroid injections x2.  She reports the first injection helped a little bit but the second injection did not provide any relief.  She continues to have sharp and aching pain in her lower back and sometimes pain shoots into her thighs and legs.  Back pain is worsened with walking or activities.  Additionally she has severe pain in her right shoulder.  She reports shoulder injection did not help the pain.  Shoulder pain is worsened with movement.  She additionally has pain in both of her hips hands, knees.  Pain makes it difficult to sleep.  She is followed by rheumatology by Dr. Alben Deeds and Azucena Fallen and is currently treated with methotrexate.  She says that she was was previously on Enbrel.   Prior treatments Tylenol and ibuprofen in past helped slightly Diclofenac oral did not help Blu Emu cream- helps a little Gabapentin 1200mg  BID to TID with mild to moderate improvement She has not tried Lyrica Hydrocodone helped in the past, it stopped working  She has not tried oxycodone Tizanidine did not help   Interval History [name] is here for follow-up due to her chronic pain.  She reports that her polyneuropathy is much improved since starting Lyrica.  She feels that this  works much better than the gabapentin.  She says she would like to try Qutenza however cost is prohibitive at this time.  She continues to have lower back pain and shoulder pain bilaterally that limits her activities.  The lower back pain did not improve significantly with the change to Lyrica.  She has not had any side effects with this medication.  She reports she previously tried tramadol and it initially helped mildly but later did not provide significant benefit.  She will occasionally get a tingling sensation in the fourth and fifth digits on both hands but this is not present currently.  She feels like she has poor balance overall however has not had any recent falls.  She does not use a cane for ambulation at this time but she has been home.   Interval History 10/01/22 [name] is here for follow-up regarding her chronic pain.  She reports that oxycodone is helping to control her lower back pain.  She uses this medication sparingly only when the pain is very severe.  The medication allows her to do more active activities and tasks at her house.  She continues to take Lyrica for burning sensation in her feet.  She does feel like the medication is not helping as much as it did when we spoke at her last visit.  Lyrica does not currently give her the drunk feeling she was getting when she is taking  gabapentin.  She denies any history of CKD or known history of kidney dysfunction.  She continues to have right shoulder pain.  She says that she would like to repeat the right shoulder injection.  She previously had this completed by her orthopedic doctor.  She asked if we can completed at her next visit if possible.   Interval History 4/1 [name] is here regarding her chronic pain and for a right shoulder injection.  She reports that oxycodone has been providing benefit to her pain however she feels like the 5 mg dose is not strong enough.  Previously when she tried 2 tablets of the oxycodone 5 mg  this provided better relief for her pain.  She continues to have back pain that is sometimes shooting down her right leg.  She continues to have pain in her right shoulder that is limiting.  She does not have any side effects with these medications.  Pain Inventory Average Pain 8 Pain Right Now 7 My pain is constant and dull  In the last 24 hours, has pain interfered with the following? General activity 10 Relation with others 0 Enjoyment of life 10 What TIME of day is your pain at its worst? night Sleep (in general) Poor  Pain is worse with: walking, bending, sitting, and some activites Pain improves with:  N/A Relief from Meds: 5  Family History  Problem Relation Age of Onset   Alzheimer's disease Mother    Lung cancer Mother 72   Diabetes Father    Heart disease Father    Arthritis Sister    Diabetes Sister    Diabetes Brother    Hypertension Brother    Colon cancer Neg Hx    Stomach cancer Neg Hx    Colon polyps Neg Hx    Esophageal cancer Neg Hx    Rectal cancer Neg Hx    Social History   Socioeconomic History   Marital status: Significant Other    Spouse name: Not on file   Number of children: 1   Years of education: 11th   Highest education level: 11th grade  Occupational History   Occupation: retired  Tobacco Use   Smoking status: Former    Packs/day: 1.00    Years: 46.00    Additional pack years: 0.00    Total pack years: 46.00    Types: Cigarettes   Smokeless tobacco: Never  Vaping Use   Vaping Use: Some days  Substance and Sexual Activity   Alcohol use: No    Alcohol/week: 0.0 standard drinks of alcohol   Drug use: No   Sexual activity: Not Currently  Other Topics Concern   Not on file  Social History Narrative   The patient is divorced she has 1 son who is married that she has a grandchild.   She does not use alcohol or drugs she drinks 3-4 caffeinated beverages daily   She is a smoker   She lives with her boyfriend   Social Determinants  of Health   Financial Resource Strain: Medium Risk (04/15/2021)   Overall Financial Resource Strain (CARDIA)    Difficulty of Paying Living Expenses: Somewhat hard  Food Insecurity: No Food Insecurity (04/15/2021)   Hunger Vital Sign    Worried About Running Out of Food in the Last Year: Never true    Ran Out of Food in the Last Year: Never true  Transportation Needs: No Transportation Needs (04/15/2021)   PRAPARE - Administrator, Civil Service (Medical):  No    Lack of Transportation (Non-Medical): No  Physical Activity: Inactive (07/24/2021)   Exercise Vital Sign    Days of Exercise per Week: 0 days    Minutes of Exercise per Session: 0 min  Stress: Stress Concern Present (07/24/2021)   [employer] of Occupational Health - Occupational Stress Questionnaire    Feeling of Stress : To some extent  Social Connections: Moderately Isolated (04/15/2021)   Social Connection and Isolation Panel [NHANES]    Frequency of Communication with Friends and Family: More than three times a week    Frequency of Social Gatherings with Friends and Family: More than three times a week    Attends Religious Services: Never    Database administrator or Organizations: No    Attends Banker Meetings: Never    Marital Status: Living with partner   Past Surgical History:  Procedure Laterality Date   BREAST LUMPECTOMY Right 2018   BREAST LUMPECTOMY WITH RADIOACTIVE SEED LOCALIZATION Left 03/03/2016   Procedure: BREAST LUMPECTOMY WITH RADIOACTIVE SEED LOCALIZATION;  Surgeon: Abigail Miyamoto, MD;  Location: [facility] OR;  Service: General;  Laterality: Left;   COLONOSCOPY  08/2002   RMR: internal hemorrhoids   COLONOSCOPY N/A 10/21/2015   Procedure: COLONOSCOPY;  Surgeon: Corbin Ade, MD;  Location: [facility];  Service: Endoscopy;  Laterality: N/A;  250 - moved to 2:35 - office to notify pt   CYST REMOVAL TRUNK     ESOPHAGOGASTRODUODENOSCOPY  08/2002   RMR: nonerosive reflux  esophagitis, hiatal hernia   TONSILLECTOMY     WISDOM TOOTH EXTRACTION     Past Surgical History:  Procedure Laterality Date   BREAST LUMPECTOMY Right 2018   BREAST LUMPECTOMY WITH RADIOACTIVE SEED LOCALIZATION Left 03/03/2016   Procedure: BREAST LUMPECTOMY WITH RADIOACTIVE SEED LOCALIZATION;  Surgeon: Abigail Miyamoto, MD;  Location: [facility] OR;  Service: General;  Laterality: Left;   COLONOSCOPY  08/2002   RMR: internal hemorrhoids   COLONOSCOPY N/A 10/21/2015   Procedure: COLONOSCOPY;  Surgeon: Corbin Ade, MD;  Location: [facility];  Service: Endoscopy;  Laterality: N/A;  250 - moved to 2:35 - office to notify pt   CYST REMOVAL TRUNK     ESOPHAGOGASTRODUODENOSCOPY  08/2002   RMR: nonerosive reflux esophagitis, hiatal hernia   TONSILLECTOMY     WISDOM TOOTH EXTRACTION     Past Medical History:  Diagnosis Date   Allergy    seasonal allergies   Anemia    hx of   Arthritis    rheumatoid   Depression    on meds   Diabetes (HCC)    Diabetic peripheral neuropathy (HCC)    GERD (gastroesophageal reflux disease)    on meds   Headache    Hyperlipidemia    on meds   Hyperplastic rectal polyp 10/21/2015   Hypertension    pt. denies   Lymphocytic colitis    Neuropathy    Rheumatoid arthritis (HCC)    Shortness of breath dyspnea    with exertion   BP (!) 147/81   Pulse 72   Ht 5\' 1"  (1.549 m)   Wt 96.2 kg   SpO2 95%   BMI 40.06 kg/m   Opioid Risk Score:   Fall Risk Score:  `1  Depression screen PHQ 2/9     11/16/2022    1:03 PM 10/01/2022    1:42 PM 08/20/2022    1:02 PM 06/05/2022   11:01 AM 05/04/2022    4:47 PM 11/19/2021  2:36 PM 09/16/2021   10:48 AM  Depression screen PHQ 2/9  Decreased Interest 0 0 0 3 2 1 2   Down, Depressed, Hopeless 0 0 0 3 3 3 3   PHQ - 2 Score 0 0 0 6 5 4 5   Altered sleeping    3 3 3 3   Tired, decreased energy    3 3 3 3   Change in appetite    0 3 0 1  Feeling bad or failure about yourself     3 3 3 3   Trouble concentrating    0 1 1 0   Moving slowly or fidgety/restless    1 0 3 2  Suicidal thoughts    0 0 0 0  PHQ-9 Score    16 18 17 17   Difficult doing work/chores    Extremely dIfficult Extremely dIfficult Somewhat difficult Somewhat difficult      Review of Systems  Musculoskeletal:  Positive for back pain and gait problem.       Objective:   Physical Exam  Physical Exam Gen: no distress, normal appearing HEENT: oral mucosa pink and moist, NCAT Chest: normal effort, normal rate of breathing Abd: soft, non-distended Ext: no edema Psych: pleasant, appears sad when talking about her pain Skin: intact Neuro: CN 2-12 grossly intact, follows commands Strength 5/5 in all 4 extremities with exception of R shoulder 4+/5 limited by pain Decreased sensation to LT in b/l LE below the knee Mild tenderness reported at b/l Knee joint line Musculoskeletal: Decreased ROM R shoulder able to abduct to about 90 degrees  Pain with movement in all directions of her right shoulder Mild L shoulder tenderenss Paraspinal tenderness C spine T spine and L spine Slump test negative  No significant pain with hip internal and external rotation       Lumbar xray 08/26/17 FINDINGS: Five lumbar type vertebral bodies are well visualized. Vertebral body height is well maintained. Multilevel disc space narrowing is noted throughout the lumbar spine progressed in the interval from the prior exam. Osteophytic changes are noted as well. No anterolisthesis is seen. A somewhat rounded calcification is noted in the right upper quadrant which may be related to cholelithiasis. Clinical correlation is recommended.   IMPRESSION: Multilevel degenerative changes progressed when compared with the prior exam of 20/8.   Rounded calcification in the right upper quadrant may represent cholelithiasis.       MRI shoulder 08/22/21 IMPRESSION: 1. Full-thickness, full width tears of the supraspinatus and infraspinatus tendons with retraction to  the glenoid. 2. Full-thickness subscapularis tear involving the mid to superior fibers with tendon gap up to 1.0 cm, and interstitial extension proximally. 3. Grade 2 muscle atrophy of the supraspinatus, infraspinatus, and subscapularis muscles. 4. Moderate glenohumeral and acromioclavicular joint osteoarthritis. 5. Likely torn and retracted long head biceps tendon. 6. Degenerative posterosuperior labral tearing.       Assessment & Plan:   DM with polyneuropathy -Consider Qutenza, currently patient would like to hold off due to cost -Cont Lyrica to 200mg  BID -Intermittent numbness in her fourth and fifth digit, Possibly some ulnar nerve irritation, Continue to monitor.   Chronic lower back pain, lumbar spondylosis -UDS and pain agreement completed prior visit -continue oxycodone 5mg  Q8h - ok to use 1-2 tabs PRN until next refill  -Increase to oxycodone 7.5mg  Q8h prn  with next refill -PT consult placed    Right shoulder pain with biceps SS, IS and Subscapularis tears, GH and AC arthritis  -Continue medications as above -  Voltaren gel  -Shoulder injection  Indication:Right Shoulder pain not relieved by medication management and other conservative care.  Informed consent was obtained after describing risks and benefits of the procedure with the patient, this includes bleeding, bruising, infection and medication side effects. The patient wishes to proceed and has given written consent. Patient was placed in a seated position. The Right shoulder was marked and prepped with betadine in the subacromial area. A 25-gauge 1-1/2 inch needle was inserted into the subacromial area. After negative draw back for blood, a solution containing 1 mL of 6 mg per ML betamethasone and 4 mL of 1% lidocaine was injected. A band aid was applied. The patient tolerated the procedure well. Post procedure instructions were given.  .  Rheumatoid arthritis and polyarthralgia -Continue f/u with rheumatology    5/31 refill request for lyrica 150mg  BID, PDMP review indicates she refilled the 150mg  dose last 2 times instead of the 200mg  dose, called no answer, will refill and discuss at next visit

## 2022-11-17 ENCOUNTER — Telehealth: Payer: Medicare Other

## 2022-11-24 ENCOUNTER — Ambulatory Visit (INDEPENDENT_AMBULATORY_CARE_PROVIDER_SITE_OTHER): Payer: Medicare Other | Admitting: Pharmacist

## 2022-11-24 ENCOUNTER — Telehealth: Payer: Self-pay | Admitting: Pharmacist

## 2022-11-24 DIAGNOSIS — E1169 Type 2 diabetes mellitus with other specified complication: Secondary | ICD-10-CM | POA: Diagnosis not present

## 2022-11-24 MED ORDER — DAPAGLIFLOZIN PROPANEDIOL 10 MG PO TABS: 10.0000 mg | ORAL_TABLET | Freq: Every day | ORAL | 4 refills | Status: DC

## 2022-11-24 NOTE — Telephone Encounter (Signed)
Can we make sure enrolled for Ozempic 1mg  weekly (your last note said Rybelsus, but patient did pick up Ozempic here in March) Also, I just escribed Farxiga to [facility]--if we can check [place]  Thank you!

## 2022-11-24 NOTE — Progress Notes (Signed)
    11/24/2022 Name: Donna Rogers MRN: [id number] DOB: 1950-11-11   S:  61 yoF Presents for diabetes evaluation, education, and management.  She has been without her medications for 3 weeks due to cost.    Insurance coverage/medication affordability: [facility] medicare  Patient denies adherence with medications. Current diabetes medications include: ozempic, farxiga, glipizide Intolerances: metformin- GI Current hypertension medications include: atenolol, losartan Goal 130/80 Current hyperlipidemia medications include: simvastatin    Patient denies hypoglycemic events.  O:  Lab Results  Component Value Date   HGBA1C 9.6 (H) 08/05/2022    Lipid Panel     Component Value Date/Time   CHOL 123 10/15/2020 1134   TRIG 184 (H) 10/15/2020 1134   HDL 29 (L) 10/15/2020 1134   CHOLHDL 4.2 10/15/2020 1134   LDLCALC 63 10/15/2020 1134     Home fasting blood sugars: 200-300  2 hour post-meal/random blood sugars: 300s.    Clinical Atherosclerotic Cardiovascular Disease (ASCVD): No   The ASCVD Risk score (Arnett DK, et al., 2019) failed to calculate for the following reasons:   The valid total cholesterol range is 130 to 320 mg/dL    A/P:  Diabetes R6VE currently uncontrolled.  Patient has been without medication for 3 weeks.  Patient was called in March to notify her of patient assistance supply, however she did not check her voicemail.  Patient to pick up Ozempic today.  -restart ozempic (patient has been without for 3 weeks.  Start with Ozempic 0.5mg  sq weekly for 3-4 weeks, then increase to 1mg  sq weekly.  Patient to pick up 4 boxes of Ozempic 1mg  supply via patient assistance (in fridge).  Denies personal and family history of Medullary thyroid cancer (MTC)  -will coordinate with [facility] re: farxiga patient assistance  -continue all other medications as prescribed by PCP    Written patient instructions provided.  Total time in face to face counseling 20 minutes.   Follow up  Pharmacist Clinic Visit in 2-3 weeks.  Kieth Brightly, PharmD, BCACP Clinical Pharmacist, [facility]

## 2022-11-27 ENCOUNTER — Ambulatory Visit: Payer: Medicare Other

## 2022-12-03 ENCOUNTER — Ambulatory Visit: Payer: Medicare Other | Admitting: Physical Therapy

## 2022-12-04 NOTE — Telephone Encounter (Signed)
Tell me about it!!  What dose is she taking now? I will still go ahead and [facility]ills to [facility] for them to have on file.

## 2022-12-07 ENCOUNTER — Other Ambulatory Visit: Payer: Self-pay | Admitting: Family Medicine

## 2022-12-07 NOTE — Telephone Encounter (Signed)
[place] pt NTBS 30-d given 11/09/22

## 2022-12-08 ENCOUNTER — Encounter: Payer: Self-pay | Admitting: Family Medicine

## 2022-12-08 NOTE — Telephone Encounter (Signed)
Letter mailed

## 2022-12-14 ENCOUNTER — Other Ambulatory Visit: Payer: Self-pay | Admitting: Family Medicine

## 2022-12-15 ENCOUNTER — Telehealth: Payer: Self-pay

## 2022-12-15 NOTE — Telephone Encounter (Signed)
Donna Rogers only has only 4 Oxycodone 7.5-325 MG on hand.She will need her pain medication to go to [facility].  But she will  need a call back first before sending in the RX. Patient stated you are ' changing the script'.   Call back phone [phone].  Filled  Written  ID  Drug  QTY  Days  Prescriber  RX #  Dispenser  Refill  Daily Dose*  Pymt Type  PMP  12/07/2022 09/11/2022 1  Pregabalin 150 Mg Capsule 60.00 30 Corie Chiquito [id number] Mad ([id number]) 2/2 2.01 LME Comm Ins [place] 11/04/2022 09/11/2022 1  Pregabalin 150 Mg Capsule 60.00 30 Corie Chiquito [id number] Mad ([id number]) 1/2 2.01 LME Comm Ins [place] 10/28/2022 10/28/2022 1  Oxycodone-Acetaminophen 5-325 90.00 30 Eu Tho [id number] Mad ([id number]) 0/0 22.50 MME Comm Ins [place]

## 2022-12-18 ENCOUNTER — Telehealth: Payer: Self-pay

## 2022-12-18 MED ORDER — OXYCODONE-ACETAMINOPHEN 7.5-325 MG PO TABS: 1.0000 | ORAL_TABLET | Freq: Three times a day (TID) | ORAL | 0 refills | Status: DC | PRN

## 2022-12-18 NOTE — Telephone Encounter (Signed)
Oxycodone  7.5-325 has been cancelled. Please send the new Rx to [facility] in [place]. Patient aware of change.  Thank you.

## 2022-12-18 NOTE — Telephone Encounter (Signed)
Patient informed. 

## 2022-12-23 DIAGNOSIS — H04123 Dry eye syndrome of bilateral lacrimal glands: Secondary | ICD-10-CM | POA: Diagnosis not present

## 2022-12-23 DIAGNOSIS — H40033 Anatomical narrow angle, bilateral: Secondary | ICD-10-CM | POA: Diagnosis not present

## 2022-12-28 ENCOUNTER — Encounter: Payer: Medicare Other | Admitting: Physical Medicine & Rehabilitation

## 2022-12-28 NOTE — Telephone Encounter (Signed)
Rec' fax from [facility]. Next 1mg  dose pen refills should ship 01/23/23

## 2022-12-29 ENCOUNTER — Other Ambulatory Visit: Payer: Self-pay | Admitting: Family Medicine

## 2022-12-29 DIAGNOSIS — E1142 Type 2 diabetes mellitus with diabetic polyneuropathy: Secondary | ICD-10-CM

## 2023-01-05 ENCOUNTER — Encounter: Payer: Medicare Other | Admitting: Physical Medicine & Rehabilitation

## 2023-01-15 ENCOUNTER — Other Ambulatory Visit: Payer: Self-pay | Admitting: Physical Medicine & Rehabilitation

## 2023-01-15 NOTE — Addendum Note (Signed)
Addended by: Fanny Dance on: 01/15/2023 05:34 PM   Modules accepted: Orders

## 2023-01-18 ENCOUNTER — Ambulatory Visit (HOSPITAL_COMMUNITY): Payer: Medicare Other

## 2023-01-21 ENCOUNTER — Other Ambulatory Visit: Payer: Self-pay | Admitting: Family Medicine

## 2023-01-21 DIAGNOSIS — I10 Essential (primary) hypertension: Secondary | ICD-10-CM

## 2023-02-01 ENCOUNTER — Other Ambulatory Visit: Payer: Self-pay | Admitting: Family Medicine

## 2023-02-01 ENCOUNTER — Ambulatory Visit (HOSPITAL_BASED_OUTPATIENT_CLINIC_OR_DEPARTMENT_OTHER)
Admission: RE | Admit: 2023-02-01 | Discharge: 2023-02-01 | Disposition: A | Discharge status: Home / Self Care | Payer: Medicare Other | Source: Ambulatory Visit | Attending: Acute Care | Admitting: Acute Care

## 2023-02-01 DIAGNOSIS — Z87891 Personal history of nicotine dependence: Secondary | ICD-10-CM | POA: Insufficient documentation

## 2023-02-01 DIAGNOSIS — Z122 Encounter for screening for malignant neoplasm of respiratory organs: Secondary | ICD-10-CM | POA: Insufficient documentation

## 2023-02-05 ENCOUNTER — Encounter: Payer: Self-pay | Admitting: Physical Medicine & Rehabilitation

## 2023-02-05 ENCOUNTER — Encounter: Payer: Medicare Other | Attending: Physical Medicine & Rehabilitation | Admitting: Physical Medicine & Rehabilitation

## 2023-02-05 ENCOUNTER — Other Ambulatory Visit: Payer: Self-pay | Admitting: Acute Care

## 2023-02-05 VITALS — BP 122/81 | HR 75 | Ht 61.0 in | Wt 200.0 lb

## 2023-02-05 DIAGNOSIS — M545 Low back pain, unspecified: Secondary | ICD-10-CM

## 2023-02-05 DIAGNOSIS — Z794 Long term (current) use of insulin: Secondary | ICD-10-CM | POA: Diagnosis not present

## 2023-02-05 DIAGNOSIS — G8929 Other chronic pain: Secondary | ICD-10-CM | POA: Insufficient documentation

## 2023-02-05 DIAGNOSIS — Z5181 Encounter for therapeutic drug level monitoring: Secondary | ICD-10-CM

## 2023-02-05 DIAGNOSIS — Z122 Encounter for screening for malignant neoplasm of respiratory organs: Secondary | ICD-10-CM

## 2023-02-05 DIAGNOSIS — M25511 Pain in right shoulder: Secondary | ICD-10-CM

## 2023-02-05 DIAGNOSIS — E1142 Type 2 diabetes mellitus with diabetic polyneuropathy: Secondary | ICD-10-CM

## 2023-02-05 DIAGNOSIS — Z79891 Long term (current) use of opiate analgesic: Secondary | ICD-10-CM

## 2023-02-05 DIAGNOSIS — Z87891 Personal history of nicotine dependence: Secondary | ICD-10-CM

## 2023-02-05 DIAGNOSIS — G894 Chronic pain syndrome: Secondary | ICD-10-CM

## 2023-02-05 MED ORDER — PREGABALIN 200 MG PO CAPS: 200.0000 mg | ORAL_CAPSULE | Freq: Two times a day (BID) | ORAL | 0 refills | Status: DC

## 2023-02-05 MED ORDER — OXYCODONE-ACETAMINOPHEN 7.5-325 MG PO TABS: 1.0000 | ORAL_TABLET | Freq: Three times a day (TID) | ORAL | 0 refills | Status: DC | PRN

## 2023-02-05 NOTE — Progress Notes (Signed)
Subjective:    Patient ID: Donna Rogers, female    DOB: 07-Nov-1950, 72 y.o.   MRN: [id number]  HPI HPI on 06/05/22 Donna Rogers is a 72 yo female with PMH of rheumatoid arthritis, chronic low back pain, diabetes mellitus type 2 with polyneuropathy, GERD, carpal tunnel right wrist, depression who is here for chronic pain.  Patient reports that she has been having burning and freezing pain in her feet that has been worsening over the past few years.  She says she feels like her feet are on ice.  Pain is worse on the dorsal and plantar aspects of her feet however she also has this pain over her ankles.  She was seen in the past by Dr. Ethelene Hal for lumbar spine arthritis and reports that she had epidural steroid injections x2.  She reports the first injection helped a little bit but the second injection did not provide any relief.  She continues to have sharp and aching pain in her lower back and sometimes pain shoots into her thighs and legs.  Back pain is worsened with walking or activities.  Additionally she has severe pain in her right shoulder.  She reports shoulder injection did not help the pain.  Shoulder pain is worsened with movement.  She additionally has pain in both of her hips hands, knees.  Pain makes it difficult to sleep.  She is followed by rheumatology by Dr. Alben Deeds and Azucena Fallen and is currently treated with methotrexate.  She says that she was was previously on Enbrel.   Prior treatments Tylenol and ibuprofen in past helped slightly Diclofenac oral did not help Blu Emu cream- helps a little Gabapentin 1200mg  BID to TID with mild to moderate improvement She has not tried Lyrica Hydrocodone helped in the past, it stopped working  She has not tried oxycodone Tizanidine did not help   Interval History Donna Rogers is here for follow-up due to her chronic pain.  She reports that her polyneuropathy is much improved since starting Lyrica.  She feels that this works  much better than the gabapentin.  She says she would like to try Qutenza however cost is prohibitive at this time.  She continues to have lower back pain and shoulder pain bilaterally that limits her activities.  The lower back pain did not improve significantly with the change to Lyrica.  She has not had any side effects with this medication.  She reports she previously tried tramadol and it initially helped mildly but later did not provide significant benefit.  She will occasionally get a tingling sensation in the fourth and fifth digits on both hands but this is not present currently.  She feels like she has poor balance overall however has not had any recent falls.  She does not use a cane for ambulation at this time but she has been home.     Interval History 10/01/22 Donna Rogers is here for follow-up regarding her chronic pain.  She reports that oxycodone is helping to control her lower back pain.  She uses this medication sparingly only when the pain is very severe.  The medication allows her to do more active activities and tasks at her house.  She continues to take Lyrica for burning sensation in her feet.  She does feel like the medication is not helping as much as it did when we spoke at her last visit.  Lyrica does not currently give her the drunk feeling she was getting when she is  taking gabapentin.  She denies any history of CKD or known history of kidney dysfunction.  She continues to have right shoulder pain.  She says that she would like to repeat the right shoulder injection.  She previously had this completed by her orthopedic doctor.  She asked if we can completed at her next visit if possible.     Interval History 4/1 Donna Rogers is here regarding her chronic pain and for a right shoulder injection.  She reports that oxycodone has been providing benefit to her pain however she feels like the 5 mg dose is not strong enough.  Previously when she tried 2 tablets of the oxycodone 5 mg  this provided better relief for her pain.  She continues to have back pain that is sometimes shooting down her right leg.  She continues to have pain in her right shoulder that is limiting.  She does not have any side effects with these medications.   Interval history 02/05/2023 Donna Rogers is here for follow-up regarding her chronic low back pain, shoulder pain and polyneuropathy.  She reports her shoulder is doing much better since we did the shoulder injection last visit.  Oxycodone 7.5 3 times daily is helping her pain stay very controlled.  She continues to have a lot of lumbar back pain however it is more tolerable with this medication.  For the past few months she has been using Lyrica 150 mg twice daily noted on PDMP review.  In February and March she was getting 200 mg of Lyrica twice daily however I think she may have reverted back to an older prescription and started taking up to 150 mg tabs from the pharmacy.  She is unsure what kind of difference is made to her pain.  She does continue to have pain in her feet that has been attributed to polyneuropathy.   Pain Inventory Average Pain 8 Pain Right Now 6 My pain is intermittent, sharp, stabbing, and tingling  In the last 24 hours, has pain interfered with the following? General activity 10 Relation with others 0 Enjoyment of life 10 What TIME of day is your pain at its worst? evening and night Sleep (in general) Poor  Pain is worse with: walking, inactivity, standing, and some activites Pain improves with: medication and injections Relief from Meds: 8  Family History  Problem Relation Age of Onset   Alzheimer's disease Mother    Lung cancer Mother 45   Diabetes Father    Heart disease Father    Arthritis Sister    Diabetes Sister    Diabetes Brother    Hypertension Brother    Colon cancer Neg Hx    Stomach cancer Neg Hx    Colon polyps Neg Hx    Esophageal cancer Neg Hx    Rectal cancer Neg Hx    Social History    Socioeconomic History   Marital status: Significant Other    Spouse name: Not on file   Number of children: 1   Years of education: 11th   Highest education level: 11th grade  Occupational History   Occupation: retired  Tobacco Use   Smoking status: Former    Packs/day: 1.00    Years: 46.00    Additional pack years: 0.00    Total pack years: 46.00    Types: Cigarettes   Smokeless tobacco: Never  Vaping Use   Vaping Use: Some days  Substance and Sexual Activity   Alcohol use: No    Alcohol/week: 0.0  standard drinks of alcohol   Drug use: No   Sexual activity: Not Currently  Other Topics Concern   Not on file  Social History Narrative   The patient is divorced she has 1 son who is married that she has a grandchild.   She does not use alcohol or drugs she drinks 3-4 caffeinated beverages daily   She is a smoker   She lives with her boyfriend   Social Determinants of Health   Financial Resource Strain: Medium Risk (04/15/2021)   Overall Financial Resource Strain (CARDIA)    Difficulty of Paying Living Expenses: Somewhat hard  Food Insecurity: No Food Insecurity (04/15/2021)   Hunger Vital Sign    Worried About Running Out of Food in the Last Year: Never true    Ran Out of Food in the Last Year: Never true  Transportation Needs: No Transportation Needs (04/15/2021)   PRAPARE - Administrator, Civil Service (Medical): No    Lack of Transportation (Non-Medical): No  Physical Activity: Inactive (07/24/2021)   Exercise Vital Sign    Days of Exercise per Week: 0 days    Minutes of Exercise per Session: 0 min  Stress: Stress Concern Present (07/24/2021)   [employer] of Occupational Health - Occupational Stress Questionnaire    Feeling of Stress : To some extent  Social Connections: Moderately Isolated (04/15/2021)   Social Connection and Isolation Panel [NHANES]    Frequency of Communication with Friends and Family: More than three times a week    Frequency  of Social Gatherings with Friends and Family: More than three times a week    Attends Religious Services: Never    Database administrator or Organizations: No    Attends Banker Meetings: Never    Marital Status: Living with partner   Past Surgical History:  Procedure Laterality Date   BREAST LUMPECTOMY Right 2018   BREAST LUMPECTOMY WITH RADIOACTIVE SEED LOCALIZATION Left 03/03/2016   Procedure: BREAST LUMPECTOMY WITH RADIOACTIVE SEED LOCALIZATION;  Surgeon: Abigail Miyamoto, MD;  Location: [facility] OR;  Service: General;  Laterality: Left;   COLONOSCOPY  08/2002   RMR: internal hemorrhoids   COLONOSCOPY N/A 10/21/2015   Procedure: COLONOSCOPY;  Surgeon: Corbin Ade, MD;  Location: [facility];  Service: Endoscopy;  Laterality: N/A;  250 - moved to 2:35 - office to notify pt   CYST REMOVAL TRUNK     ESOPHAGOGASTRODUODENOSCOPY  08/2002   RMR: nonerosive reflux esophagitis, hiatal hernia   TONSILLECTOMY     WISDOM TOOTH EXTRACTION     Past Surgical History:  Procedure Laterality Date   BREAST LUMPECTOMY Right 2018   BREAST LUMPECTOMY WITH RADIOACTIVE SEED LOCALIZATION Left 03/03/2016   Procedure: BREAST LUMPECTOMY WITH RADIOACTIVE SEED LOCALIZATION;  Surgeon: Abigail Miyamoto, MD;  Location: [facility] OR;  Service: General;  Laterality: Left;   COLONOSCOPY  08/2002   RMR: internal hemorrhoids   COLONOSCOPY N/A 10/21/2015   Procedure: COLONOSCOPY;  Surgeon: Corbin Ade, MD;  Location: [facility];  Service: Endoscopy;  Laterality: N/A;  250 - moved to 2:35 - office to notify pt   CYST REMOVAL TRUNK     ESOPHAGOGASTRODUODENOSCOPY  08/2002   RMR: nonerosive reflux esophagitis, hiatal hernia   TONSILLECTOMY     WISDOM TOOTH EXTRACTION     Past Medical History:  Diagnosis Date   Allergy    seasonal allergies   Anemia    hx of   Arthritis    rheumatoid  Depression    on meds   Diabetes (HCC)    Diabetic peripheral neuropathy (HCC)    GERD (gastroesophageal reflux disease)     on meds   Headache    Hyperlipidemia    on meds   Hyperplastic rectal polyp 10/21/2015   Hypertension    pt. denies   Lymphocytic colitis    Neuropathy    Rheumatoid arthritis (HCC)    Shortness of breath dyspnea    with exertion   Ht 5\' 1"  (1.549 m)   BMI 40.06 kg/m   Opioid Risk Score:   Fall Risk Score:  `1  Depression screen PHQ 2/9     11/16/2022    1:03 PM 10/01/2022    1:42 PM 08/20/2022    1:02 PM 06/05/2022   11:01 AM 05/04/2022    4:47 PM 11/19/2021    2:36 PM 09/16/2021   10:48 AM  Depression screen PHQ 2/9  Decreased Interest 0 0 0 3 2 1 2   Down, Depressed, Hopeless 0 0 0 3 3 3 3   PHQ - 2 Score 0 0 0 6 5 4 5   Altered sleeping    3 3 3 3   Tired, decreased energy    3 3 3 3   Change in appetite    0 3 0 1  Feeling bad or failure about yourself     3 3 3 3   Trouble concentrating    0 1 1 0  Moving slowly or fidgety/restless    1 0 3 2  Suicidal thoughts    0 0 0 0  PHQ-9 Score    16 18 17 17   Difficult doing work/chores    Extremely dIfficult Extremely dIfficult Somewhat difficult Somewhat difficult     Review of Systems  Musculoskeletal:  Positive for back pain.       Rt leg pain  All other systems reviewed and are negative.      Objective:   Physical Exam  Physical Exam Gen: no distress, normal appearing HEENT: oral mucosa pink and moist, NCAT Chest: normal effort, normal rate of breathing Abd: soft, non-distended Ext: no edema Psych: pleasant, appropriate Skin: intact Neuro: CN 2-12 grossly intact, follows commands Strength 5/5 in all 4 extremities with exception of R shoulder 4+/5 limited by pain Decreased sensation to LT in b/l LE below the knee  Musculoskeletal: Right shoulder ROM improved from prior visit-she does have pain at end range ROM Pain with ROM passive in all directions of her right shoulder-much improved Paraspinal tenderness C spine T spine and L spine No significant pain with bilateral hip internal and external  rotation Mild tenderness reported at b/l Knee joint line     Lumbar xray 08/26/17 FINDINGS: Five lumbar type vertebral bodies are well visualized. Vertebral body height is well maintained. Multilevel disc space narrowing is noted throughout the lumbar spine progressed in the interval from the prior exam. Osteophytic changes are noted as well. No anterolisthesis is seen. A somewhat rounded calcification is noted in the right upper quadrant which may be related to cholelithiasis. Clinical correlation is recommended.   IMPRESSION: Multilevel degenerative changes progressed when compared with the prior exam of 20/8.   Rounded calcification in the right upper quadrant may represent cholelithiasis.       MRI shoulder 08/22/21 IMPRESSION: 1. Full-thickness, full width tears of the supraspinatus and infraspinatus tendons with retraction to the glenoid. 2. Full-thickness subscapularis tear involving the mid to superior fibers with tendon gap up to 1.0 cm,  and interstitial extension proximally. 3. Grade 2 muscle atrophy of the supraspinatus, infraspinatus, and subscapularis muscles. 4. Moderate glenohumeral and acromioclavicular joint osteoarthritis. 5. Likely torn and retracted long head biceps tendon. 6. Degenerative posterosuperior labral tearing.        Assessment & Plan:     DM with polyneuropathy -Consider Qutenza, currently patient would like to hold off due to cost -Cont Lyrica to 200mg  BID--- 150mg  BID  -Intermittent numbness in her fourth and fifth digit, Possibly some ulnar nerve irritation, Continue to monitor.   Chronic lower back pain, lumbar spondylosis -UDS and pain agreement completed prior visit -Continue oxycodone 7.5mg  Q8h prn #90- refill -PT consult placed- again -Consider repeat lumbar spine MRI if pain worsens further -Continue UDS random and pill counts -Recheck UDS today    Right shoulder pain with biceps SS, IS and Subscapularis tears, GH and AC  arthritis  -Continue medications as above -Continue use of Voltaren gel as needed -Shoulder injection-last visit provided benefit   Rheumatoid arthritis and polyarthralgia -Continue f/u with rheumatology

## 2023-02-08 ENCOUNTER — Telehealth: Payer: Self-pay

## 2023-02-08 NOTE — Telephone Encounter (Signed)
Attempted to contact patient and let her know that ozempic had arrived through patient assistance. Placed in fridge. Unable to reach patient and VM was full

## 2023-02-09 ENCOUNTER — Other Ambulatory Visit: Payer: Self-pay | Admitting: Family Medicine

## 2023-02-09 NOTE — Telephone Encounter (Signed)
Patient aware.

## 2023-02-11 LAB — TOXASSURE SELECT,+ANTIDEPR,UR

## 2023-03-05 ENCOUNTER — Ambulatory Visit (INDEPENDENT_AMBULATORY_CARE_PROVIDER_SITE_OTHER): Payer: Medicare Other | Admitting: Family Medicine

## 2023-03-05 ENCOUNTER — Encounter: Payer: Self-pay | Admitting: Family Medicine

## 2023-03-05 VITALS — BP 129/80 | HR 75 | Temp 98.5°F | Ht 61.0 in | Wt 199.4 lb

## 2023-03-05 DIAGNOSIS — B3731 Acute candidiasis of vulva and vagina: Secondary | ICD-10-CM | POA: Diagnosis not present

## 2023-03-05 DIAGNOSIS — M0579 Rheumatoid arthritis with rheumatoid factor of multiple sites without organ or systems involvement: Secondary | ICD-10-CM | POA: Diagnosis not present

## 2023-03-05 DIAGNOSIS — E1142 Type 2 diabetes mellitus with diabetic polyneuropathy: Secondary | ICD-10-CM

## 2023-03-05 DIAGNOSIS — L989 Disorder of the skin and subcutaneous tissue, unspecified: Secondary | ICD-10-CM | POA: Diagnosis not present

## 2023-03-05 DIAGNOSIS — E785 Hyperlipidemia, unspecified: Secondary | ICD-10-CM | POA: Diagnosis not present

## 2023-03-05 DIAGNOSIS — E1165 Type 2 diabetes mellitus with hyperglycemia: Secondary | ICD-10-CM | POA: Diagnosis not present

## 2023-03-05 DIAGNOSIS — Z7985 Long-term (current) use of injectable non-insulin antidiabetic drugs: Secondary | ICD-10-CM | POA: Insufficient documentation

## 2023-03-05 DIAGNOSIS — K2100 Gastro-esophageal reflux disease with esophagitis, without bleeding: Secondary | ICD-10-CM

## 2023-03-05 DIAGNOSIS — E1159 Type 2 diabetes mellitus with other circulatory complications: Secondary | ICD-10-CM

## 2023-03-05 DIAGNOSIS — N760 Acute vaginitis: Secondary | ICD-10-CM

## 2023-03-05 DIAGNOSIS — E1169 Type 2 diabetes mellitus with other specified complication: Secondary | ICD-10-CM

## 2023-03-05 DIAGNOSIS — I152 Hypertension secondary to endocrine disorders: Secondary | ICD-10-CM | POA: Diagnosis not present

## 2023-03-05 DIAGNOSIS — B9689 Other specified bacterial agents as the cause of diseases classified elsewhere: Secondary | ICD-10-CM

## 2023-03-05 DIAGNOSIS — F32A Depression, unspecified: Secondary | ICD-10-CM

## 2023-03-05 LAB — CBC
MCV: 89 fL (ref 79–97)
Platelets: 236 10*3/uL (ref 150–450)
RDW: 13.1 % (ref 11.7–15.4)
WBC: 6.4 10*3/uL (ref 3.4–10.8)

## 2023-03-05 LAB — WET PREP, GENITAL
Clue Cell Exam: POSITIVE — AB
Trichomonas Exam: NEGATIVE
Yeast Exam: POSITIVE — AB

## 2023-03-05 LAB — BAYER DCA HB A1C WAIVED: HB A1C (BAYER DCA - WAIVED): 13.5 % — ABNORMAL HIGH (ref 4.8–5.6)

## 2023-03-05 LAB — CMP14+EGFR
ALT: 32 IU/L (ref 0–32)
BUN/Creatinine Ratio: 11 — ABNORMAL LOW (ref 12–28)
BUN: 9 mg/dL (ref 8–27)
Bilirubin Total: 0.4 mg/dL (ref 0.0–1.2)
CO2: 25 mmol/L (ref 20–29)
Chloride: 98 mmol/L (ref 96–106)
Creatinine, Ser: 0.85 mg/dL (ref 0.57–1.00)
Potassium: 4 mmol/L (ref 3.5–5.2)
Sodium: 139 mmol/L (ref 134–144)
eGFR: 74 mL/min/{1.73_m2} (ref >59)

## 2023-03-05 LAB — LIPID PANEL

## 2023-03-05 LAB — TRICHOMONAS CULTURE

## 2023-03-05 MED ORDER — GLIPIZIDE ER 5 MG PO TB24: 5.0000 mg | ORAL_TABLET | Freq: Two times a day (BID) | ORAL | 0 refills | Status: DC

## 2023-03-05 MED ORDER — FREESTYLE LIBRE READER DEVI
1.0000 [IU] | Freq: Every day | 1 refills | Status: DC
Start: 2023-03-05 — End: 2024-01-03

## 2023-03-05 MED ORDER — FREESTYLE LIBRE READER DEVI
1.0000 [IU] | Freq: Every day | 1 refills | Status: DC
Start: 2023-03-05 — End: 2023-03-05

## 2023-03-05 MED ORDER — CITALOPRAM HYDROBROMIDE 40 MG PO TABS
ORAL_TABLET | ORAL | 3 refills | Status: DC
Start: 2023-03-05 — End: 2024-01-03

## 2023-03-05 MED ORDER — TRESIBA FLEXTOUCH 100 UNIT/ML ~~LOC~~ SOPN
10.0000 [IU] | PEN_INJECTOR | Freq: Every day | SUBCUTANEOUS | 3 refills | Status: DC
Start: 2023-03-05 — End: 2023-07-28

## 2023-03-05 MED ORDER — BUPROPION HCL ER (XL) 300 MG PO TB24
300.0000 mg | ORAL_TABLET | Freq: Every day | ORAL | 3 refills | Status: DC
Start: 2023-03-05 — End: 2024-01-03

## 2023-03-05 MED ORDER — FLUCONAZOLE 150 MG PO TABS
ORAL_TABLET | ORAL | 0 refills | Status: DC
Start: 2023-03-05 — End: 2023-10-26

## 2023-03-05 MED ORDER — FREESTYLE LIBRE 3 SENSOR MISC
3 refills | Status: DC
Start: 2023-03-05 — End: 2023-03-05

## 2023-03-05 MED ORDER — NOVOFINE PLUS PEN NEEDLE 32G X 4 MM MISC
3 refills | Status: AC
Start: 2023-03-05 — End: ?

## 2023-03-05 MED ORDER — FREESTYLE LIBRE 3 SENSOR MISC
3 refills | Status: DC
Start: 2023-03-05 — End: 2024-01-03

## 2023-03-05 MED ORDER — GLIPIZIDE ER 5 MG PO TB24
5.0000 mg | ORAL_TABLET | Freq: Two times a day (BID) | ORAL | 3 refills | Status: DC
Start: 2023-03-05 — End: 2024-01-03

## 2023-03-05 MED ORDER — SIMVASTATIN 40 MG PO TABS
40.0000 mg | ORAL_TABLET | Freq: Every day | ORAL | 3 refills | Status: DC
Start: 2023-03-05 — End: 2024-01-03

## 2023-03-05 MED ORDER — METRONIDAZOLE 500 MG PO TABS: 500.0000 mg | ORAL_TABLET | Freq: Two times a day (BID) | ORAL | 0 refills | Status: DC

## 2023-03-05 MED ORDER — ATENOLOL 25 MG PO TABS
25.0000 mg | ORAL_TABLET | Freq: Every day | ORAL | 3 refills | Status: DC
Start: 2023-03-05 — End: 2024-01-03

## 2023-03-05 MED ORDER — OMEPRAZOLE 40 MG PO CPDR
40.0000 mg | DELAYED_RELEASE_CAPSULE | Freq: Every day | ORAL | 3 refills | Status: DC
Start: 2023-03-05 — End: 2024-01-03

## 2023-03-05 MED ORDER — LOSARTAN POTASSIUM 100 MG PO TABS: 100.0000 mg | ORAL_TABLET | Freq: Every day | ORAL | 3 refills | Status: DC

## 2023-03-05 NOTE — Progress Notes (Unsigned)
Subjective: CC:DM PCP: Raliegh Ip, DO ZOX:[id number] A Feeback is a 72 y.o. female presenting to clinic today for:  1. Type 2 Diabetes with hypertension, hyperlipidemia w/ neuropathy:  She is compliant with all medications.  No reports of hypoglycemic episodes.  She is currently injecting 2 mg of Ozempic.  Needs refills on Zocor and glipizide.  Last eye exam: needs Last foot exam: needs Last A1c:  Lab Results  Component Value Date   HGBA1C 9.6 (H) 08/05/2022   Nephropathy screen indicated?: 04/2023 Last flu, zoster and/or pneumovax:  Immunization History  Administered Date(s) Administered   Fluad Quad(high Dose 65+) 05/03/2019, 05/04/2022, 08/05/2022   Influenza,inj,Quad PF,6+ Mos 05/25/2013, 07/31/2015, 06/18/2016, 05/16/2018   Influenza-Unspecified 05/07/2019, 07/24/2020, 05/08/2021   PPD Test 05/08/2013   Pneumococcal Conjugate-13 05/16/2018   Pneumococcal Polysaccharide-23 07/03/2019   Tdap 12/28/2018   Zoster Recombinant(Shingrix) 06/06/2019, 06/30/2019, 10/27/2019    ROS: Does report some polydipsia and polyuria.  Has known neuropathy.  She is vaginal irritation.  No dysuria.  ROS: Per HPI  Allergies  Allergen Reactions   Ciprofloxacin Hcl Rash   Enbrel [Etanercept] Rash   Metformin And Related Diarrhea   Past Medical History:  Diagnosis Date   Allergy    seasonal allergies   Anemia    hx of   Arthritis    rheumatoid   Depression    on meds   Diabetes (HCC)    Diabetic peripheral neuropathy (HCC)    GERD (gastroesophageal reflux disease)    on meds   Headache    Hyperlipidemia    on meds   Hyperplastic rectal polyp 10/21/2015   Hypertension    pt. denies   Lymphocytic colitis    Neuropathy    Rheumatoid arthritis (HCC)    Shortness of breath dyspnea    with exertion    Current Outpatient Medications:    Accu-Chek Softclix Lancets lancets, USE TO check blood sugar TWICE DAILY, Disp: , Rfl:    Alpha-Lipoic Acid 600 MG CAPS, TAKE (1)  CAPSULE DAILY FOR NERVE PAIN, Disp: 90 capsule, Rfl: 0   Ascorbic Acid (VITAMIN C) 500 MG CHEW, 1 tablet, Disp: , Rfl:    aspirin 81 MG tablet, Take 81 mg by mouth daily., Disp: , Rfl:    atenolol (TENORMIN) 25 MG tablet, Take 1 tablet (25 mg total) by mouth daily. (NEEDS TO BE SEEN BEFORE NEXT REFILL), Disp: 30 tablet, Rfl: 0   blood glucose meter kit and supplies, Dispense based on patient and insurance preference. Use up to four times daily as directed. (FOR ICD-10 E10.9, E11.9)., Disp: 1 each, Rfl: 0   buPROPion (WELLBUTRIN XL) 300 MG 24 hr tablet, TAKE ONE TABLET ONCE DAILY, Disp: 30 tablet, Rfl: 5   busPIRone (BUSPAR) 10 MG tablet, TAKE (1) TABLET TWICE A DAY., Disp: 60 tablet, Rfl: 1   cholecalciferol (VITAMIN D) 1000 UNITS tablet, Take 1,000 Units by mouth daily., Disp: , Rfl:    citalopram (CELEXA) 40 MG tablet, TAKE ONE TABLET ONCE DAILY (NEEDS TO BE SEEN BEFORE NEXT REFILL), Disp: 30 tablet, Rfl: 0   Cyanocobalamin (VITAMIN B 12 PO), Take by mouth., Disp: , Rfl:    dapagliflozin propanediol (FARXIGA) 10 MG TABS tablet, Take 1 tablet (10 mg total) by mouth daily., Disp: 90 tablet, Rfl: 4   fluconazole (DIFLUCAN) 150 MG tablet, 1 po q week x 4 weeks, Disp: 4 tablet, Rfl: 0   folic acid (FOLVITE) 1 MG tablet, Take 1 mg by mouth daily., Disp: ,  Rfl:    glipiZIDE (GLUCOTROL XL) 5 MG 24 hr tablet, Take 1 tablet (5 mg total) by mouth 2 (two) times daily with a meal. (NEEDS TO BE SEEN BEFORE NEXT REFILL), Disp: 60 tablet, Rfl: 0   glucose blood test strip, Use as instructed to check sugar twice daily E11.9, Disp: 100 each, Rfl: 12   Lancet Device MISC, Check sugar 2 times daily E11.9, Disp: 100 each, Rfl: 12   losartan (COZAAR) 100 MG tablet, Take 1 tablet (100 mg total) by mouth daily. (NEEDS TO BE SEEN BEFORE NEXT REFILL), Disp: 30 tablet, Rfl: 0   methotrexate (RHEUMATREX) 2.5 MG tablet, 6 tablets Orally once weekly on the same day for 90 days, Disp: , Rfl:    omeprazole (PRILOSEC) 40 MG  capsule, TAKE 1 CAPSULE DAILY, Disp: 90 capsule, Rfl: 1   oxyCODONE-acetaminophen (PERCOCET) 7.5-325 MG tablet, Take 1 tablet by mouth every 8 (eight) hours as needed for severe pain., Disp: 90 tablet, Rfl: 0   pregabalin (LYRICA) 200 MG capsule, Take 1 capsule (200 mg total) by mouth 2 (two) times daily., Disp: 60 capsule, Rfl: 0   Pyridoxine HCl (VITAMIN B-6 PO), Take by mouth., Disp: , Rfl:    Semaglutide,0.25 or 0.5MG /DOS, (OZEMPIC, 0.25 OR 0.5 MG/DOSE,) 2 MG/3ML SOPN, Inject 1 mg into the skin every 7 (seven) days., Disp: 9 mL, Rfl: 3   simvastatin (ZOCOR) 40 MG tablet, Take 1 tablet (40 mg total) by mouth daily. (NEEDS TO BE SEEN BEFORE NEXT REFILL), Disp: 30 tablet, Rfl: 0   sulfaSALAzine (AZULFIDINE) 500 MG tablet, Take 500 mg by mouth 2 (two) times daily., Disp: , Rfl:    Suvorexant (BELSOMRA) 10 MG TABS, Take 10 mg by mouth at bedtime as needed., Disp: 30 tablet, Rfl: 0   tiZANidine (ZANAFLEX) 4 MG tablet, Take 0.5-1 tablets (2-4 mg total) by mouth every 8 (eight) hours as needed for muscle spasms., Disp: 90 tablet, Rfl: 0  Current Facility-Administered Medications:    diclofenac Sodium (VOLTAREN) 1 % topical gel 2 g, 2 g, Topical, QID, Fanny Dance, MD Social History   Socioeconomic History   Marital status: Significant Other    Spouse name: Not on file   Number of children: 1   Years of education: 11th   Highest education level: 11th grade  Occupational History   Occupation: retired  Tobacco Use   Smoking status: Former    Current packs/day: 1.00    Average packs/day: 1 pack/day for 46.0 years (46.0 ttl pk-yrs)    Types: Cigarettes   Smokeless tobacco: Never  Vaping Use   Vaping status: Some Days  Substance and Sexual Activity   Alcohol use: No    Alcohol/week: 0.0 standard drinks of alcohol   Drug use: No   Sexual activity: Not Currently  Other Topics Concern   Not on file  Social History Narrative   The patient is divorced she has 1 son who is married that she  has a grandchild.   She does not use alcohol or drugs she drinks 3-4 caffeinated beverages daily   She is a smoker   She lives with her boyfriend   Social Determinants of Health   Financial Resource Strain: Medium Risk (04/15/2021)   Overall Financial Resource Strain (CARDIA)    Difficulty of Paying Living Expenses: Somewhat hard  Food Insecurity: No Food Insecurity (04/15/2021)   Hunger Vital Sign    Worried About Running Out of Food in the Last Year: Never true    Ran  Out of Food in the Last Year: Never true  Transportation Needs: No Transportation Needs (04/15/2021)   PRAPARE - Administrator, Civil Service (Medical): No    Lack of Transportation (Non-Medical): No  Physical Activity: Inactive (07/24/2021)   Exercise Vital Sign    Days of Exercise per Week: 0 days    Minutes of Exercise per Session: 0 min  Stress: Stress Concern Present (07/24/2021)   [employer] of Occupational Health - Occupational Stress Questionnaire    Feeling of Stress : To some extent  Social Connections: Moderately Isolated (04/15/2021)   Social Connection and Isolation Panel [NHANES]    Frequency of Communication with Friends and Family: More than three times a week    Frequency of Social Gatherings with Friends and Family: More than three times a week    Attends Religious Services: Never    Database administrator or Organizations: No    Attends Banker Meetings: Never    Marital Status: Living with partner  Intimate Partner Violence: Not At Risk (04/15/2021)   Humiliation, Afraid, Rape, and Kick questionnaire    Fear of Current or Ex-Partner: No    Emotionally Abused: No    Physically Abused: No    Sexually Abused: No   Family History  Problem Relation Age of Onset   Alzheimer's disease Mother    Lung cancer Mother 21   Diabetes Father    Heart disease Father    Arthritis Sister    Diabetes Sister    Diabetes Brother    Hypertension Brother    Colon cancer Neg Hx     Stomach cancer Neg Hx    Colon polyps Neg Hx    Esophageal cancer Neg Hx    Rectal cancer Neg Hx     Objective: Office vital signs reviewed. BP 129/80   Pulse 75   Temp 98.5 F (36.9 C)   Ht 5\' 1"  (1.549 m)   Wt 199 lb 6.4 oz (90.4 kg)   SpO2 92%   BMI 37.68 kg/m   Physical Examination:  General: Awake, alert, obese, No acute distress HEENT: sclera white, MMM Cardio: regular rate and rhythm, S1S2 heard, no murmurs appreciated Pulm: clear to auscultation bilaterally, no wheezes, rhonchi or rales; normal work of breathing on room air Skin: Has a skin lesion on the right leg that measures 5 mm x 7 mm.  Assessment/ Plan: 72 y.o. female   Uncontrolled type 2 diabetes mellitus with hyperglycemia (HCC) - Plan: Bayer DCA Hb A1c Waived, CMP14+EGFR, Microalbumin / creatinine urine ratio, glipiZIDE (GLUCOTROL XL) 5 MG 24 hr tablet, insulin degludec (TRESIBA FLEXTOUCH) 100 UNIT/ML FlexTouch Pen, Continuous Glucose Sensor (FREESTYLE LIBRE 3 SENSOR) MISC, Continuous Glucose Receiver (FREESTYLE LIBRE READER) DEVI, DISCONTINUED: Continuous Glucose Sensor (FREESTYLE LIBRE 3 SENSOR) MISC, DISCONTINUED: Continuous Glucose Receiver (FREESTYLE LIBRE READER) DEVI  Hypertension associated with diabetes (HCC) - Plan: CMP14+EGFR, losartan (COZAAR) 100 MG tablet, atenolol (TENORMIN) 25 MG tablet  Hyperlipidemia associated with type 2 diabetes mellitus (HCC) - Plan: CMP14+EGFR, Lipid Panel, simvastatin (ZOCOR) 40 MG tablet  Diabetic polyneuropathy associated with type 2 diabetes mellitus (HCC) - Plan: Continuous Glucose Sensor (FREESTYLE LIBRE 3 SENSOR) MISC, Continuous Glucose Receiver (FREESTYLE LIBRE READER) DEVI, DISCONTINUED: Continuous Glucose Sensor (FREESTYLE LIBRE 3 SENSOR) MISC, DISCONTINUED: Continuous Glucose Receiver (FREESTYLE LIBRE READER) DEVI  Rheumatoid arthritis involving multiple sites with positive rheumatoid factor (HCC) - Plan: CBC  Vaginal candidiasis - Plan: fluconazole  (DIFLUCAN) 150 MG tablet  Bacterial vaginosis -  Plan: Wet prep, genital, metroNIDAZOLE (FLAGYL) 500 MG tablet  Gastroesophageal reflux disease with esophagitis without hemorrhage - Plan: omeprazole (PRILOSEC) 40 MG capsule  Depressive disorder - Plan: citalopram (CELEXA) 40 MG tablet, buPROPion (WELLBUTRIN XL) 300 MG 24 hr tablet  Skin lesion - Plan: Ambulatory referral to Dermatology  Needs to start insulin.  On PAP currently.  Tresiba ordered.  CGM ordered.  Messaged CCM to coordinate/ assist with demonstration of insulin and CGM use. Would like 10u to start then increase by 1 u q2 days. Check urine microalbumin  Continue statin  Check CMP/ CBC for RA.  Currently treated with methotrexate by Rheum  Blood pressure controlled.  Continue current regimen.  Continue current regimen for diabetic neuropathy with Lyrica.  Vaginal candidiasis and bacterial vaginosis noted on wet prep.  Diflucan and Flagyl sent.    Wellbutrin, Celexa renewed.  GERD not discussed but PPI renewed  Referral to dermatology for skin lesion of the right leg.  This measures 5 mm x 7 mm  No orders of the defined types were placed in this encounter.  No orders of the defined types were placed in this encounter.    Raliegh Ip, DO [facility] [phone]

## 2023-03-06 LAB — LIPID PANEL
Chol/HDL Ratio: 4.5 ratio — ABNORMAL HIGH (ref 0.0–4.4)
Cholesterol, Total: 141 mg/dL (ref 100–199)
HDL: 31 mg/dL — ABNORMAL LOW (ref >39)
Triglycerides: 205 mg/dL — ABNORMAL HIGH (ref 0–149)
VLDL Cholesterol Cal: 35 mg/dL (ref 5–40)

## 2023-03-06 LAB — CMP14+EGFR
AST: 35 IU/L (ref 0–40)
Albumin: 4.1 g/dL (ref 3.9–4.9)
Alkaline Phosphatase: 72 IU/L (ref 44–121)
Calcium: 9.4 mg/dL (ref 8.7–10.3)
Globulin, Total: 2.7 g/dL (ref 1.5–4.5)
Glucose: 257 mg/dL — ABNORMAL HIGH (ref 70–99)
Total Protein: 6.8 g/dL (ref 6.0–8.5)

## 2023-03-06 LAB — CBC
Hematocrit: 42.4 % (ref 34.0–46.6)
Hemoglobin: 14.2 g/dL (ref 11.1–15.9)
MCH: 29.9 pg (ref 26.6–33.0)
MCHC: 33.5 g/dL (ref 31.5–35.7)
RBC: 4.75 x10E6/uL (ref 3.77–5.28)

## 2023-03-06 LAB — MICROALBUMIN / CREATININE URINE RATIO
Creatinine, Urine: 69.6 mg/dL
Microalb/Creat Ratio: 14 mg/g creat (ref 0–29)
Microalbumin, Urine: 9.9 ug/mL

## 2023-03-10 ENCOUNTER — Telehealth: Payer: Self-pay

## 2023-03-10 ENCOUNTER — Telehealth: Payer: Self-pay | Admitting: Pharmacist

## 2023-03-10 NOTE — Telephone Encounter (Signed)
Donna Rogers (Key: [id number]) PA Case ID #: [id number] Rx #: [id number] Need Help? Call us at [phone] Status sent iconSent to Plan today Drug FreeStyle Libre 3 Sensor ePA cloud logo Form [employer] Medicare Part D Electronic Prior Authorization Form [phone] NCPDP)

## 2023-03-10 NOTE — Telephone Encounter (Signed)
    Unsuccessful outreach x2.  Voicemail left encouraging return call to set up appt ASAP with PharmD. Will await patient's return call   Kieth Brightly, PharmD, BCACP Clinical Pharmacist, [facility]

## 2023-03-10 NOTE — Telephone Encounter (Signed)
Anthony Sar (KeyRachel Bo) PA Case ID #: [id number] Rx #: [id number] Need Help? Call us at [phone] Status sent iconSent to Plan today Drug FreeStyle Libre 3 Reader device ePA cloud logo Form [employer] Medicare Part D Electronic Prior Authorization Form [id number] NCPDP)

## 2023-03-11 ENCOUNTER — Telehealth: Payer: Medicare Other

## 2023-03-11 ENCOUNTER — Encounter: Payer: Medicare Other | Admitting: Pharmacist

## 2023-03-11 ENCOUNTER — Encounter: Payer: Medicare Other | Attending: Physical Medicine & Rehabilitation | Admitting: Physical Medicine & Rehabilitation

## 2023-03-11 ENCOUNTER — Other Ambulatory Visit (INDEPENDENT_AMBULATORY_CARE_PROVIDER_SITE_OTHER): Payer: Self-pay | Admitting: Pharmacist

## 2023-03-11 ENCOUNTER — Telehealth: Payer: Self-pay | Admitting: Pharmacist

## 2023-03-11 DIAGNOSIS — G894 Chronic pain syndrome: Secondary | ICD-10-CM | POA: Insufficient documentation

## 2023-03-11 DIAGNOSIS — Z79891 Long term (current) use of opiate analgesic: Secondary | ICD-10-CM | POA: Insufficient documentation

## 2023-03-11 DIAGNOSIS — M25511 Pain in right shoulder: Secondary | ICD-10-CM | POA: Insufficient documentation

## 2023-03-11 DIAGNOSIS — E119 Type 2 diabetes mellitus without complications: Secondary | ICD-10-CM

## 2023-03-11 DIAGNOSIS — Z794 Long term (current) use of insulin: Secondary | ICD-10-CM

## 2023-03-11 DIAGNOSIS — G8929 Other chronic pain: Secondary | ICD-10-CM | POA: Insufficient documentation

## 2023-03-11 DIAGNOSIS — M545 Low back pain, unspecified: Secondary | ICD-10-CM | POA: Insufficient documentation

## 2023-03-11 DIAGNOSIS — E1142 Type 2 diabetes mellitus with diabetic polyneuropathy: Secondary | ICD-10-CM | POA: Insufficient documentation

## 2023-03-11 NOTE — Telephone Encounter (Signed)
    03/11/2023 Name: Donna Rogers MRN: [id number] DOB: 31-Jan-1951  PA submitted for Libre 3 CGM Patient is on new start insulin She meets medicare requirements for CGM Chart notes attached on cover my meds   Kieth Brightly, PharmD, BCACP Clinical Pharmacist, [facility]

## 2023-03-11 NOTE — Progress Notes (Signed)
This encounter was created in error - please disregard.  See pharmD note from 03/11/23

## 2023-03-11 NOTE — Progress Notes (Signed)
   03/11/2023 Name: RIATA IKEDA MRN: [id number] DOB: 08/27/1950  Chief Complaint  Patient presents with   Diabetes  I connected with  Neita Carp on 03/11/23 by telephone and verified that I am speaking with the correct person using two identifiers.  Patient was located at home and PharmD was in clinic office during this call.   Patient reports she is unable to come in today or tomorrow to be seen by PharmD.  A1c is 13.5%.  Patient states her blood sugars have come down to the 200s.  Next appt available is next Thursday at 1:30pm  Discussed Type 2 Diabetes medication regimen with patient.   We will discontinue Marcelline Deist due to recurrent yeast infections Continue Ozempic 2mg  weekly patient denies personal or family history of medullary thyroid carcinoma (MTC) or in patients with Multiple Endocrine Neoplasia syndrome type 2 (MEN 2) Will likely discontinue glipizide Intolerance to metformin  Patient hasn't picked up insulin or Libre 3 CGM from the pharmacy.  Her gentleman friend is helping with her medications.  He will go to pharmacy to inquire about new prescriptions. Encouraged patient to pick up prescriptions and bring in to her appt next Thursday.   Will re-enroll patient for novo nordisk patient assistance for Ozempic 2mg  and Tresiba 20 units nightly.   Kieth Brightly, PharmD, BCACP Clinical Pharmacist, [facility]

## 2023-03-12 ENCOUNTER — Telehealth: Payer: Self-pay

## 2023-03-12 ENCOUNTER — Encounter: Payer: Medicare Other | Admitting: Physical Medicine & Rehabilitation

## 2023-03-12 ENCOUNTER — Encounter: Payer: Self-pay | Admitting: Physical Medicine & Rehabilitation

## 2023-03-12 VITALS — BP 138/86 | HR 74 | Ht 61.0 in | Wt 199.2 lb

## 2023-03-12 DIAGNOSIS — M25511 Pain in right shoulder: Secondary | ICD-10-CM | POA: Diagnosis not present

## 2023-03-12 DIAGNOSIS — G8929 Other chronic pain: Secondary | ICD-10-CM | POA: Diagnosis not present

## 2023-03-12 DIAGNOSIS — G894 Chronic pain syndrome: Secondary | ICD-10-CM

## 2023-03-12 DIAGNOSIS — M545 Low back pain, unspecified: Secondary | ICD-10-CM | POA: Diagnosis not present

## 2023-03-12 DIAGNOSIS — E1142 Type 2 diabetes mellitus with diabetic polyneuropathy: Secondary | ICD-10-CM

## 2023-03-12 DIAGNOSIS — Z79891 Long term (current) use of opiate analgesic: Secondary | ICD-10-CM | POA: Diagnosis not present

## 2023-03-12 MED ORDER — DICLOFENAC SODIUM 1 % EX GEL: 2.0000 g | Freq: Four times a day (QID) | CUTANEOUS | 4 refills | Status: AC

## 2023-03-12 NOTE — Telephone Encounter (Signed)
Submitted NEW MED of TRESIBA FLEXTOUCH to [employer] for patient assistance.   Patient currently enrolled.  Phone: [phone]

## 2023-03-12 NOTE — Progress Notes (Signed)
Subjective:    Patient ID: Donna Rogers, female    DOB: August 31, 1950, 72 y.o.   MRN: [id number]  HPI HPI on 06/05/22 Donna Rogers is a 72 yo female with PMH of rheumatoid arthritis, chronic low back pain, diabetes mellitus type 2 with polyneuropathy, GERD, carpal tunnel right wrist, depression who is here for chronic pain.  Patient reports that she has been having burning and freezing pain in her feet that has been worsening over the past few years.  She says she feels like her feet are on ice.  Pain is worse on the dorsal and plantar aspects of her feet however she also has this pain over her ankles.  She was seen in the past by Dr. Ethelene Hal for lumbar spine arthritis and reports that she had epidural steroid injections x2.  She reports the first injection helped a little bit but the second injection did not provide any relief.  She continues to have sharp and aching pain in her lower back and sometimes pain shoots into her thighs and legs.  Back pain is worsened with walking or activities.  Additionally she has severe pain in her right shoulder.  She reports shoulder injection did not help the pain.  Shoulder pain is worsened with movement.  She additionally has pain in both of her hips hands, knees.  Pain makes it difficult to sleep.  She is followed by rheumatology by Dr. Alben Deeds and Azucena Fallen and is currently treated with methotrexate.  She says that she was was previously on Enbrel.   Prior treatments Tylenol and ibuprofen in past helped slightly Diclofenac oral did not help Blu Emu cream- helps a little Gabapentin 1200mg  BID to TID with mild to moderate improvement She has not tried Lyrica Hydrocodone helped in the past, it stopped working  She has not tried oxycodone Tizanidine did not help   Interval History Donna Rogers is here for follow-up due to her chronic pain.  She reports that her polyneuropathy is much improved since starting Lyrica.  She feels that this works  much better than the gabapentin.  She says she would like to try Qutenza however cost is prohibitive at this time.  She continues to have lower back pain and shoulder pain bilaterally that limits her activities.  The lower back pain did not improve significantly with the change to Lyrica.  She has not had any side effects with this medication.  She reports she previously tried tramadol and it initially helped mildly but later did not provide significant benefit.  She will occasionally get a tingling sensation in the fourth and fifth digits on both hands but this is not present currently.  She feels like she has poor balance overall however has not had any recent falls.  She does not use a cane for ambulation at this time but she has been home.     Interval History 10/01/22 Donna Rogers is here for follow-up regarding her chronic pain.  She reports that oxycodone is helping to control her lower back pain.  She uses this medication sparingly only when the pain is very severe.  The medication allows her to do more active activities and tasks at her house.  She continues to take Lyrica for burning sensation in her feet.  She does feel like the medication is not helping as much as it did when we spoke at her last visit.  Lyrica does not currently give her the drunk feeling she was getting when she is  taking gabapentin.  She denies any history of CKD or known history of kidney dysfunction.  She continues to have right shoulder pain.  She says that she would like to repeat the right shoulder injection.  She previously had this completed by her orthopedic doctor.  She asked if we can completed at her next visit if possible.     Interval History 4/1 Donna Rogers is here regarding her chronic pain and for a right shoulder injection.  She reports that oxycodone has been providing benefit to her pain however she feels like the 5 mg dose is not strong enough.  Previously when she tried 2 tablets of the oxycodone 5 mg  this provided better relief for her pain.  She continues to have back pain that is sometimes shooting down her right leg.  She continues to have pain in her right shoulder that is limiting.  She does not have any side effects with these are working well for her and not.   Interval history 02/05/2023 Donna Rogers is here for follow-up regarding her chronic low back pain, shoulder pain and polyneuropathy.  She reports her shoulder is doing much better since we did the shoulder injection last visit.  Oxycodone 7.5 3 times daily is helping her pain stay very controlled.  She continues to have a lot of lumbar back pain however it is more tolerable with this medication.  For the past few months she has been using Lyrica 150 mg twice daily noted on PDMP review.  In February and March she was getting 200 mg of Lyrica twice daily however I think she may have reverted back to an older prescription and started taking up to 150 mg tabs from the pharmacy.  She is unsure what kind of difference is made to her pain.  She does continue to have pain in her feet that has been attributed to polyneuropathy.    Interval history 03/12/2023 Donna Rogers is here for follow-up regarding her chronic lower back pain, right shoulder pain and polyneuropathy.  Patient feels that her pain is doing better overall since her last visit.  She does ask when she can do her repeat shoulder injection as the pain is gradually coming back.  She does not feel like she needs an injection today because pain is still overall controlled.  She reports current medications are working well for her and not causing any significant side effects.  Patient reports she is using Percocet sparingly and this appears consistent with her pill count.   Pain Inventory Average Pain 6 Pain Right Now 5 My pain is intermittent, sharp, stabbing, and aching  In the last 24 hours, has pain interfered with the following? General activity 9 Relation with others 3 Enjoyment  of life 9 What TIME of day is your pain at its worst? daytime and night Sleep (in general) Fair  Pain is worse with: walking, standing, and some activites Pain improves with: medication Relief from Meds: 9  Family History  Problem Relation Age of Onset   Alzheimer's disease Mother    Lung cancer Mother 92   Diabetes Father    Heart disease Father    Arthritis Sister    Diabetes Sister    Diabetes Brother    Hypertension Brother    Colon cancer Neg Hx    Stomach cancer Neg Hx    Colon polyps Neg Hx    Esophageal cancer Neg Hx    Rectal cancer Neg Hx    Social History  Socioeconomic History   Marital status: Significant Other    Spouse name: Not on file   Number of children: 1   Years of education: 11th   Highest education level: 11th grade  Occupational History   Occupation: retired  Tobacco Use   Smoking status: Former    Current packs/day: 1.00    Average packs/day: 1 pack/day for 46.0 years (46.0 ttl pk-yrs)    Types: Cigarettes   Smokeless tobacco: Never  Vaping Use   Vaping status: Some Days  Substance and Sexual Activity   Alcohol use: No    Alcohol/week: 0.0 standard drinks of alcohol   Drug use: No   Sexual activity: Not Currently  Other Topics Concern   Not on file  Social History Narrative   The patient is divorced she has 1 son who is married that she has a grandchild.   She does not use alcohol or drugs she drinks 3-4 caffeinated beverages daily   She is a smoker   She lives with her boyfriend   Social Determinants of Health   Financial Resource Strain: Medium Risk (04/15/2021)   Overall Financial Resource Strain (CARDIA)    Difficulty of Paying Living Expenses: Somewhat hard  Food Insecurity: No Food Insecurity (04/15/2021)   Hunger Vital Sign    Worried About Running Out of Food in the Last Year: Never true    Ran Out of Food in the Last Year: Never true  Transportation Needs: No Transportation Needs (04/15/2021)   PRAPARE - Therapist, art (Medical): No    Lack of Transportation (Non-Medical): No  Physical Activity: Inactive (07/24/2021)   Exercise Vital Sign    Days of Exercise per Week: 0 days    Minutes of Exercise per Session: 0 min  Stress: Stress Concern Present (07/24/2021)   [employer] of Occupational Health - Occupational Stress Questionnaire    Feeling of Stress : To some extent  Social Connections: Moderately Isolated (04/15/2021)   Social Connection and Isolation Panel [NHANES]    Frequency of Communication with Friends and Family: More than three times a week    Frequency of Social Gatherings with Friends and Family: More than three times a week    Attends Religious Services: Never    Database administrator or Organizations: No    Attends Banker Meetings: Never    Marital Status: Living with partner   Past Surgical History:  Procedure Laterality Date   BREAST LUMPECTOMY Right 2018   BREAST LUMPECTOMY WITH RADIOACTIVE SEED LOCALIZATION Left 03/03/2016   Procedure: BREAST LUMPECTOMY WITH RADIOACTIVE SEED LOCALIZATION;  Surgeon: Abigail Miyamoto, MD;  Location: [facility] OR;  Service: General;  Laterality: Left;   COLONOSCOPY  08/2002   RMR: internal hemorrhoids   COLONOSCOPY N/A 10/21/2015   Procedure: COLONOSCOPY;  Surgeon: Corbin Ade, MD;  Location: [facility];  Service: Endoscopy;  Laterality: N/A;  250 - moved to 2:35 - office to notify pt   CYST REMOVAL TRUNK     ESOPHAGOGASTRODUODENOSCOPY  08/2002   RMR: nonerosive reflux esophagitis, hiatal hernia   TONSILLECTOMY     WISDOM TOOTH EXTRACTION     Past Surgical History:  Procedure Laterality Date   BREAST LUMPECTOMY Right 2018   BREAST LUMPECTOMY WITH RADIOACTIVE SEED LOCALIZATION Left 03/03/2016   Procedure: BREAST LUMPECTOMY WITH RADIOACTIVE SEED LOCALIZATION;  Surgeon: Abigail Miyamoto, MD;  Location: [facility] OR;  Service: General;  Laterality: Left;   COLONOSCOPY  08/2002   RMR:  internal hemorrhoids    COLONOSCOPY N/A 10/21/2015   Procedure: COLONOSCOPY;  Surgeon: Corbin Ade, MD;  Location: [facility];  Service: Endoscopy;  Laterality: N/A;  250 - moved to 2:35 - office to notify pt   CYST REMOVAL TRUNK     ESOPHAGOGASTRODUODENOSCOPY  08/2002   RMR: nonerosive reflux esophagitis, hiatal hernia   TONSILLECTOMY     WISDOM TOOTH EXTRACTION     Past Medical History:  Diagnosis Date   Allergy    seasonal allergies   Anemia    hx of   Arthritis    rheumatoid   Depression    on meds   Diabetes (HCC)    Diabetic peripheral neuropathy (HCC)    GERD (gastroesophageal reflux disease)    on meds   Headache    Hyperlipidemia    on meds   Hyperplastic rectal polyp 10/21/2015   Hypertension    pt. denies   Lymphocytic colitis    Neuropathy    Rheumatoid arthritis (HCC)    Shortness of breath dyspnea    with exertion   BP 138/86   Pulse 74   Ht 5\' 1"  (1.549 m)   Wt 199 lb 3.2 oz (90.4 kg)   SpO2 96%   BMI 37.64 kg/m   Opioid Risk Score:   Fall Risk Score:  `1  Depression screen [facility] 2/9     03/12/2023    2:32 PM 03/05/2023   10:32 AM 11/16/2022    1:03 PM 10/01/2022    1:42 PM 08/20/2022    1:02 PM 06/05/2022   11:01 AM 05/04/2022    4:47 PM  Depression screen PHQ 2/9  Decreased Interest 0 1 0 0 0 3 2  Down, Depressed, Hopeless 0 1 0 0 0 3 3  PHQ - 2 Score 0 2 0 0 0 6 5  Altered sleeping  3    3 3   Tired, decreased energy  3    3 3   Change in appetite  0    0 3  Feeling bad or failure about yourself   1    3 3   Trouble concentrating  0    0 1  Moving slowly or fidgety/restless  0    1 0  Suicidal thoughts  0    0 0  PHQ-9 Score  9    16 18   Difficult doing work/chores  Somewhat difficult    Extremely dIfficult Extremely dIfficult     Review of Systems  Constitutional: Negative.   HENT: Negative.    Eyes: Negative.   Respiratory: Negative.    Cardiovascular: Negative.   Gastrointestinal: Negative.   Endocrine: Negative.   Genitourinary: Negative.    Musculoskeletal:  Positive for back pain.       Rt leg pain  Skin: Negative.   Allergic/Immunologic: Negative.   Neurological: Negative.   Hematological: Negative.   Psychiatric/Behavioral: Negative.    All other systems reviewed and are negative.      Objective:   Physical Exam  Physical Exam Gen: no distress, normal appearing HEENT: oral mucosa pink and moist, NCAT Chest: normal effort, normal rate of breathing Abd: soft, non-distended Ext: no edema Psych: pleasant, appropriate Skin: intact Neuro: CN 2-12 grossly intact, follows commands No focal motor deficits noted Decreased sensation to LT in b/l LE below the knee  Musculoskeletal: Right shoulder ROM improved from prior visit Pain with internal rotation of right shoulder Mild paraspinal tenderness C spine T spine and L spine Mild  tenderness reported at b/l Knee joint line     Lumbar xray 08/26/17 FINDINGS: Five lumbar type vertebral bodies are well visualized. Vertebral body height is well maintained. Multilevel disc space narrowing is noted throughout the lumbar spine progressed in the interval from the prior exam. Osteophytic changes are noted as well. No anterolisthesis is seen. A somewhat rounded calcification is noted in the right upper quadrant which may be related to cholelithiasis. Clinical correlation is recommended.   IMPRESSION: Multilevel degenerative changes progressed when compared with the prior exam of 20/8.   Rounded calcification in the right upper quadrant may represent cholelithiasis.       MRI shoulder 08/22/21 IMPRESSION: 1. Full-thickness, full width tears of the supraspinatus and infraspinatus tendons with retraction to the glenoid. 2. Full-thickness subscapularis tear involving the mid to superior fibers with tendon gap up to 1.0 cm, and interstitial extension proximally. 3. Grade 2 muscle atrophy of the supraspinatus, infraspinatus, and subscapularis muscles. 4. Moderate  glenohumeral and acromioclavicular joint osteoarthritis. 5. Likely torn and retracted long head biceps tendon. 6. Degenerative posterosuperior labral tearing.        Assessment & Plan:      Chronic lower back pain, lumbar spondylosis -UDS and pain agreement completed prior visit -Continue oxycodone 7.5mg  Q8h prn #90-advised to call several days before due for refill -Consider repeat lumbar spine MRI if pain worsens further -Continue UDS random, PDMP monitoring -Pill count consistent today -Continue use of TENS -Prior UDS consistent    Right shoulder pain with biceps SS, IS and Subscapularis tears, GH and AC arthritis  -Continue medications as above -Voltaren gel ordered  -Shoulder injection completed prior visit.  Can repeat if needed at a later time if needed.  Discussed risks of repeated shoulder injections   DM with polyneuropathy -Consider Qutenza, currently patient would like to hold off due to cost -Cont Lyrica to 200mg  BID -Intermittent numbness in her fourth and fifth digit, Possibly some ulnar nerve irritation, Continue to monitor.  Rheumatoid arthritis and polyarthralgia -Continue f/u with rheumatology

## 2023-03-15 ENCOUNTER — Other Ambulatory Visit (HOSPITAL_COMMUNITY): Payer: Self-pay

## 2023-03-15 NOTE — Telephone Encounter (Signed)
Can you check into this? 

## 2023-03-15 NOTE — Telephone Encounter (Signed)
Pharmacy Patient Advocate Encounter  Received notification from [facility] that Prior Authorization for FreeStyle [place] 3 Reader device has been CANCELLED due to; Additional quantities of FREESTY LIBR MIS 3 READER are not available as part of the Part B pharmacy benefit.  PA #/Case ID/Reference #: [id number]

## 2023-03-15 NOTE — Telephone Encounter (Signed)
Pharmacy Patient Advocate Encounter  Received notification from [facility] that Prior Authorization for FreeStyle Libre 3 Sensor has been CANCELLED due to; Previously approved on [id number] from 03/11/2023 to 08/17/2023  PA #/Case ID/Reference #: [id number]   Ran test claim, came back refill too soon. Filled 03/11/23. Next refill 04/01/23

## 2023-03-18 ENCOUNTER — Ambulatory Visit (INDEPENDENT_AMBULATORY_CARE_PROVIDER_SITE_OTHER): Payer: Medicare Other | Admitting: Pharmacist

## 2023-03-18 ENCOUNTER — Telehealth: Payer: Self-pay | Admitting: Family Medicine

## 2023-03-18 DIAGNOSIS — Z7985 Long-term (current) use of injectable non-insulin antidiabetic drugs: Secondary | ICD-10-CM | POA: Diagnosis not present

## 2023-03-18 DIAGNOSIS — Z7984 Long term (current) use of oral hypoglycemic drugs: Secondary | ICD-10-CM

## 2023-03-18 DIAGNOSIS — Z794 Long term (current) use of insulin: Secondary | ICD-10-CM | POA: Diagnosis not present

## 2023-03-18 DIAGNOSIS — E1165 Type 2 diabetes mellitus with hyperglycemia: Secondary | ICD-10-CM | POA: Diagnosis not present

## 2023-03-18 DIAGNOSIS — E119 Type 2 diabetes mellitus without complications: Secondary | ICD-10-CM

## 2023-03-18 NOTE — Progress Notes (Signed)
03/18/2023 Name: Donna Rogers MRN: [id number] DOB: September 30, 1950  Chief Complaint  Patient presents with   Diabetes    Donna Rogers is a 72 y.o. year old female who was referred for medication management by their primary care provider, Donna Flavin M, DO. They presented for a face to face visit today.   They were referred to the pharmacist by their PCP for assistance in managing diabetes, medication access, complex medication management, and new start CGM libre 3 training. Patient reports continued memory/cognition issues that makes it difficult with disease state management.  She is accompanied today in clinic by her friend, Donna Rogers.  He is now assisting patient with her medications and disease state management.     Subjective:  Care Team: Primary Care Provider: Raliegh Ip, DO ; Next Scheduled Visit: 06/07/2023 PharmD in 4 weeks  Medication Access/Adherence  Current Pharmacy:  [facility] [place], [place] - [address] [place] [address] Phone: [phone] Fax: [phone]  [facility] - [place], [place] - [address] [place] [address] [place] [address] Phone: [phone] Fax: [phone]  [id number] - [place], [place] - [address] [place] [address] Phone: [phone] Fax: [phone]   Patient reports affordability concerns with their medications: Yes  Patient reports access/transportation concerns to their pharmacy: No  Patient reports adherence concerns with their medications:  Yes  due to memory/cognition--patient now has caregiver/friend Donna Rogers)  Diabetes:  Current medications: Ozempic (clarify dose; pt states she is on the "red" pen which may be 0.25mg  or 0.5mg  weekly will confirm), farxiga 10mg  daily, glipizide New start tresiba 10 units Medications tried in the past:  jardiance, rybelsus, glyburide  Current glucose readings: 200-400s Using glucometer  (need to verify brand); testing once daily  -Patient is testing blood sugar 1-2 times daily -Patient is injecting insulin 1 or more times daily -She would greatly benefit from a continuous glucose monitoring system (libre 3 to be set up today--patient using reader, patient does not keep up with cell phone & requested reader   Patient denies hypoglycemic s/sx including dizziness, shakiness, sweating. Patient reports hyperglycemic symptoms including polyuria, polydipsia, polyphagia, nocturia, neuropathy, blurred vision.  Current meal patterns:  Discussed meal planning options and Plate method for healthy eating Avoid sugary drinks and desserts Incorporate balanced protein, non starchy veggies, 1 serving of carbohydrate with each meal Increase water intake Increase physical activity as able  Current physical activity: limited; encouraged as able  Current medication access support: novo nordisk patient assistance program; az&me for farxiga   Objective:  Lab Results  Component Value Date   HGBA1C 13.5 (H) 03/05/2023    Lab Results  Component Value Date   CREATININE 0.85 03/05/2023   BUN 9 03/05/2023   NA 139 03/05/2023   K 4.0 03/05/2023   CL 98 03/05/2023   CO2 25 03/05/2023    Lab Results  Component Value Date   CHOL 141 03/05/2023   HDL 31 (L) 03/05/2023   LDLCALC 75 03/05/2023   TRIG 205 (H) 03/05/2023   CHOLHDL 4.5 (H) 03/05/2023    Medications Reviewed Today   Medications were not reviewed in this encounter     Assessment/Plan:   Diabetes: - Currently uncontrolled -- A1c 13.5% likely due to memory issues; now has caregiver assisting with medications daily - Reviewed long term cardiovascular and renal outcomes of uncontrolled blood sugar - Reviewed goal  A1c, goal fasting, and goal 2 hour post prandial glucose - Reviewed dietary modifications including FOLLOWING A HEART HEALTHY DIET/HEALTHY PLATE METHOD - Recommend to : Start new Tresiba 10 units  nightly Patient was able to demonstrate teachback technique Sample pen and needles provided Hold farxiga until glycemic control improves--patient reports recurrent yeast infections Continue Ozempic--need to clarify dose; patient denies personal or family history of medullary thyroid carcinoma (MTC) or in patients with Multiple Endocrine Neoplasia syndrome type 2 (MEN 2) Continue glipizide for now [place] 3 CGM education and set up provided - Recommend to check glucose continuously using new Libre 3 CGM - Meets financial criteria for novonordisk (ozempic/tresiba) patient assistance program. Will collaborate with provider, CPhT, and patient to pursue continued assistance.    Follow Up Plan: pharmd in 1 week  Kieth Brightly, PharmD, BCACP Clinical Pharmacist, [facility]

## 2023-03-18 NOTE — Telephone Encounter (Signed)
Pts spouse called to let Raynelle Fanning know that pt is currently taking the Ozempic 1.0 weekly.

## 2023-03-22 ENCOUNTER — Telehealth: Payer: Self-pay | Admitting: Family Medicine

## 2023-03-22 NOTE — Telephone Encounter (Signed)
Patient calling to let Raynelle Fanning know that she is on 1mg  of Ozempic not 2mg . Wants to talk to her about going on 2mg . Please call back

## 2023-03-23 NOTE — Telephone Encounter (Signed)
no answer; VM box full

## 2023-03-25 NOTE — Telephone Encounter (Signed)
Received notification from [employer] regarding approval for [facility]. Patient assistance approved from 03/16/23 to 08/17/23.  Medication will ship to office in 10-14 business days.  Phone: [phone]

## 2023-04-01 ENCOUNTER — Other Ambulatory Visit: Payer: Self-pay | Admitting: Pharmacist

## 2023-04-01 NOTE — Progress Notes (Signed)
04/01/2023 Name: Donna Rogers       MRN: [id number]       DOB: Nov 23, 1950      Chief Complaint  Patient presents with   Diabetes      Donna Rogers is a 72 y.o. year old female who was referred for medication management by their primary care provider, Delynn Flavin M, DO.    They were referred to the pharmacist by their PCP for assistance in managing diabetes, medication access, complex medication management, and new start CGM libre 3 training. Patient reports continued memory/cognition issues that makes it difficult with disease state management.  She is accompanied today in clinic by her friend, Shon Hale.  He is now assisting patient with her medications and disease state management.      Subjective:   Care Team: Primary Care Provider: Raliegh Ip, DO ; Next Scheduled Visit: 06/07/2023 PharmD in 4 weeks   Medication Access/Adherence   Current Pharmacy:  [facility] [place], [place] - [address] [place] [address] Phone: [phone] Fax: [phone]   [facility] - [place], [place] - [address] [place] [address] [place] [address] Phone: [phone] Fax: [phone]   [id number] - [place],  - [address] [place] [address] Phone: [phone] Fax: [phone]     Patient reports affordability concerns with their medications: Yes  Patient reports access/transportation concerns to their pharmacy: No  Patient reports adherence concerns with their medications:  Yes  due to memory/cognition--patient now has caregiver/friend Shon Hale)   Diabetes:   Current medications: Ozempic 1mg  -->plan to increase to 2mg  weekly, farxiga 10mg  daily, glipizide Tresiba 10 units Medications tried in the past:  jardiance, rybelsus, glyburide   Current glucose readings: 200-400s Using glucometer (need to verify brand); testing once daily   -Patient is testing blood sugar 1-2  times daily -Patient is injecting insulin 1 time daily -She will greatly benefit from a continuous glucose monitoring system (libre 3 to be set up today--patient using reader, patient does not keep up with cell phone & requested reader    Patient denies hypoglycemic s/sx including dizziness, shakiness, sweating. Patient reports hyperglycemic symptoms including polyuria, polydipsia, polyphagia, nocturia, neuropathy, blurred vision.   Current meal patterns:  Discussed meal planning options and Plate method for healthy eating Avoid sugary drinks and desserts Incorporate balanced protein, non starchy veggies, 1 serving of carbohydrate with each meal Increase water intake Increase physical activity as able   Current physical activity: limited; encouraged as able   Current medication access support: novo nordisk patient assistance program; az&me for farxiga     Objective:   Recent Labs       Lab Results  Component Value Date    HGBA1C 13.5 (H) 03/05/2023        Recent Labs       Lab Results  Component Value Date    CREATININE 0.85 03/05/2023    BUN 9 03/05/2023    NA 139 03/05/2023    K 4.0 03/05/2023    CL 98 03/05/2023    CO2 25 03/05/2023        Recent Labs       Lab Results  Component Value Date    CHOL 141 03/05/2023    HDL 31 (L) 03/05/2023    LDLCALC 75 03/05/2023    TRIG 205 (H) 03/05/2023    CHOLHDL 4.5 (  H) 03/05/2023        Medications Reviewed Today   Medications were not reviewed in this encounter       Assessment/Plan:    Diabetes: - Currently uncontrolled -- A1c 13.5% likely due to memory issues; now has caregiver assisting with medications daily - Reviewed long term cardiovascular and renal outcomes of uncontrolled blood sugar - Reviewed goal A1c, goal fasting, and goal 2 hour post prandial glucose - Reviewed dietary modifications including FOLLOWING A HEART HEALTHY DIET/HEALTHY PLATE METHOD - Recommend to : Increas to [place] 12 units  nightly Patient was able to demonstrate teachback technique Sample pen and needles provided Continue to hold farxiga until glycemic control improves--patient reports recurrent yeast infections  Continue Ozempic 1mg  weekly-->will increase to 2mg  weekly; patient denies personal or family history of medullary thyroid carcinoma (MTC) or in patients with Multiple Endocrine Neoplasia syndrome type 2 (MEN 2) Continue glipizide for now [place] 3 CGM education and set up provided - Recommend to check glucose continuously using new Libre 3 CGM - Meets financial criteria for novonordisk (ozempic/tresiba) patient assistance program. Will collaborate with provider, CPhT, and patient to pursue continued assistance.      Follow Up Plan: pharmd in 4 weeks    Kieth Brightly, PharmD, BCACP Clinical Pharmacist, [facility]

## 2023-04-12 ENCOUNTER — Other Ambulatory Visit: Payer: Self-pay | Admitting: Family Medicine

## 2023-04-12 ENCOUNTER — Other Ambulatory Visit: Payer: Self-pay | Admitting: Physical Medicine & Rehabilitation

## 2023-04-12 DIAGNOSIS — E1142 Type 2 diabetes mellitus with diabetic polyneuropathy: Secondary | ICD-10-CM

## 2023-04-20 ENCOUNTER — Telehealth: Payer: Self-pay | Admitting: Family Medicine

## 2023-04-20 ENCOUNTER — Ambulatory Visit (INDEPENDENT_AMBULATORY_CARE_PROVIDER_SITE_OTHER): Payer: Medicare Other

## 2023-04-20 VITALS — Ht 61.0 in | Wt 199.0 lb

## 2023-04-20 DIAGNOSIS — Z0000 Encounter for general adult medical examination without abnormal findings: Secondary | ICD-10-CM | POA: Diagnosis not present

## 2023-04-20 DIAGNOSIS — Z1231 Encounter for screening mammogram for malignant neoplasm of breast: Secondary | ICD-10-CM | POA: Diagnosis not present

## 2023-04-20 DIAGNOSIS — Z780 Asymptomatic menopausal state: Secondary | ICD-10-CM | POA: Diagnosis not present

## 2023-04-20 NOTE — Telephone Encounter (Signed)
Cmp and cbc was done in July for her.  Please make sure her rheumatologist gets a copy sent to them.

## 2023-04-20 NOTE — Patient Instructions (Signed)
Donna Rogers , Thank you for taking time to come for your Medicare Wellness Visit. I appreciate your ongoing commitment to your health goals. Please review the following plan we discussed and let me know if I can assist you in the future.   Referrals/Orders/Follow-Ups/Clinician Recommendations: Aim for 30 minutes of exercise or brisk walking, 6-8 glasses of water, and 5 servings of fruits and vegetables each day.   This is a list of the screening recommended for you and due dates:  Health Maintenance  Topic Date Due   Eye exam for diabetics  04/04/2022   Complete foot exam   11/20/2022   Flu Shot  03/18/2023   DEXA scan (bone density measurement)  04/05/2023   COVID-19 Vaccine (1 - 2023-24 season) Never done   Mammogram  05/05/2023*   Hemoglobin A1C  09/05/2023   Screening for Lung Cancer  02/01/2024   Yearly kidney function blood test for diabetes  03/04/2024   Yearly kidney health urinalysis for diabetes  03/04/2024   Medicare Annual Wellness Visit  04/19/2024   Colon Cancer Screening  10/20/2025   DTaP/Tdap/Td vaccine (2 - Td or Tdap) 12/27/2028   Pneumonia Vaccine  Completed   Hepatitis C Screening  Completed   Zoster (Shingles) Vaccine  Completed   HPV Vaccine  Aged Out  *Topic was postponed. The date shown is not the original due date.    Advanced directives: (Provided) Advance directive discussed with you today. I have provided a copy for you to complete at home and have notarized. Once this is complete, please bring a copy in to our office so we can scan it into your chart. Information on Advanced Care Planning can be found at [facility] Advance Health Care Directives Advance Health Care Directives ([email or web address]    Next Medicare Annual Wellness Visit scheduled for next year: Yes  Insert Preventive Care attachment Insert FALL PREVENTION attachment if needed

## 2023-04-20 NOTE — Progress Notes (Signed)
Subjective:   Donna Rogers is a 72 y.o. female who presents for Medicare Annual (Subsequent) preventive examination.  Visit Complete: Virtual  I connected with  Donna Rogers on 04/20/23 by a audio enabled telemedicine application and verified that I am speaking with the correct person using two identifiers.  Patient Location: Home  Provider Location: Home Office  I discussed the limitations of evaluation and management by telemedicine. The patient expressed understanding and agreed to proceed.  Patient Medicare AWV questionnaire was completed by the patient on 04/20/2023; I have confirmed that all information answered by patient is correct and no changes since this date.  Review of Systems    Vital Signs: Unable to obtain new vitals due to this being a telehealth visit.  Cardiac Risk Factors include: advanced age (>21men, >98 women);diabetes mellitus;dyslipidemia;hypertension;sedentary lifestyle;smoking/ tobacco exposure Nutrition Risk Assessment:  Has the patient had any N/V/D within the last 2 months?  No  Does the patient have any non-healing wounds?  No  Has the patient had any unintentional weight loss or weight gain?  No   Diabetes:  Is the patient diabetic?  Yes  If diabetic, was a CBG obtained today?  No  Did the patient bring in their glucometer from home?  No  How often do you monitor your CBG's? Daily .   Financial Strains and Diabetes Management:  Are you having any financial strains with the device, your supplies or your medication? No .  Does the patient want to be seen by Chronic Care Management for management of their diabetes?  No  Would the patient like to be referred to a Nutritionist or for Diabetic Management?  No   Diabetic Exams:  Diabetic Eye Exam: Completed 10/2022 Diabetic Foot Exam: Overdue, Pt has been advised about the importance in completing this exam. Pt is scheduled for diabetic foot exam on next office visit .     Objective:     Today's Vitals   04/20/23 1433  Weight: 199 lb (90.3 kg)  Height: 5\' 1"  (1.549 m)   Body mass index is 37.6 kg/m.     04/20/2023    2:39 PM 04/16/2022    3:07 PM 04/15/2021    4:09 PM 10/31/2019    2:32 PM 08/29/2019   10:20 AM 07/27/2018   12:17 PM 03/03/2016    7:27 AM  Advanced Directives  Does Patient Have a Medical Advance Directive? No No No No No No No  Would patient like information on creating a medical advance directive? Yes (MAU/Ambulatory/Procedural Areas - Information given) No - Patient declined Yes (MAU/Ambulatory/Procedural Areas - Information given) No - Patient declined   No - patient declined information    Current Medications (verified) Outpatient Encounter Medications as of 04/20/2023  Medication Sig   Accu-Chek Softclix Lancets lancets USE TO check blood sugar TWICE DAILY   Alpha-Lipoic Acid 600 MG CAPS TAKE (1) CAPSULE DAILY FOR NERVE PAIN   Ascorbic Acid (VITAMIN C) 500 MG CHEW 1 tablet   aspirin 81 MG tablet Take 81 mg by mouth daily.   atenolol (TENORMIN) 25 MG tablet Take 1 tablet (25 mg total) by mouth daily.   blood glucose meter kit and supplies Dispense based on patient and insurance preference. Use up to four times daily as directed. (FOR ICD-10 E10.9, E11.9).   buPROPion (WELLBUTRIN XL) 300 MG 24 hr tablet Take 1 tablet (300 mg total) by mouth daily.   busPIRone (BUSPAR) 10 MG tablet TAKE (1) TABLET TWICE A DAY.  cholecalciferol (VITAMIN D) 1000 UNITS tablet Take 1,000 Units by mouth daily.   citalopram (CELEXA) 40 MG tablet TAKE ONE TABLET ONCE DAILY   Continuous Glucose Receiver (FREESTYLE LIBRE READER) DEVI 1 Units by Does not apply route daily. UAD to test BGs daily. Dx E11.65   Continuous Glucose Sensor (FREESTYLE LIBRE 3 SENSOR) MISC Place 1 sensor on the skin every 14 days. Use to check glucose continuously E11.65   Cyanocobalamin (VITAMIN B 12 PO) Take by mouth.   diclofenac Sodium (VOLTAREN ARTHRITIS PAIN) 1 % GEL Apply 2 g topically 4 (four)  times daily.   fluconazole (DIFLUCAN) 150 MG tablet Take 1 tablet now and repeat dose in 3 days   folic acid (FOLVITE) 1 MG tablet Take 1 mg by mouth daily.   glipiZIDE (GLUCOTROL XL) 5 MG 24 hr tablet Take 1 tablet (5 mg total) by mouth 2 (two) times daily with a meal.   glucose blood test strip Use as instructed to check sugar twice daily E11.9   insulin degludec (TRESIBA FLEXTOUCH) 100 UNIT/ML FlexTouch Pen Inject 10-20 Units into the skin at bedtime. BRING TO APPT WITH Raynelle Fanning.  She will show how to start.   Insulin Pen Needle (NOVOFINE PLUS PEN NEEDLE) 32G X 4 MM MISC UAD to inject insulin. E11.65   Lancet Device MISC Check sugar 2 times daily E11.9   losartan (COZAAR) 100 MG tablet Take 1 tablet (100 mg total) by mouth daily.   methotrexate (RHEUMATREX) 2.5 MG tablet 6 tablets Orally once weekly on the same day for 90 days   metroNIDAZOLE (FLAGYL) 500 MG tablet Take 1 tablet (500 mg total) by mouth 2 (two) times daily.   omeprazole (PRILOSEC) 40 MG capsule Take 1 capsule (40 mg total) by mouth daily.   oxyCODONE-acetaminophen (PERCOCET) 7.5-325 MG tablet Take 1 tablet by mouth every 8 (eight) hours as needed for severe pain.   pregabalin (LYRICA) 200 MG capsule TAKE ONE CAPSULE BY MOUTH TWICE DAILY   Pyridoxine HCl (VITAMIN B-6 PO) Take by mouth.   Semaglutide,0.25 or 0.5MG /DOS, (OZEMPIC, 0.25 OR 0.5 MG/DOSE,) 2 MG/3ML SOPN Inject 1 mg into the skin every 7 (seven) days.   simvastatin (ZOCOR) 40 MG tablet Take 1 tablet (40 mg total) by mouth daily.   sulfaSALAzine (AZULFIDINE) 500 MG tablet Take 500 mg by mouth 2 (two) times daily.   Suvorexant (BELSOMRA) 10 MG TABS Take 10 mg by mouth at bedtime as needed.   tiZANidine (ZANAFLEX) 4 MG tablet Take 0.5-1 tablets (2-4 mg total) by mouth every 8 (eight) hours as needed for muscle spasms.   No facility-administered encounter medications on file as of 04/20/2023.    Allergies (verified) Ciprofloxacin hcl, Enbrel [etanercept], Farxiga  [dapagliflozin], and Metformin and related   History: Past Medical History:  Diagnosis Date   Allergy    seasonal allergies   Anemia    hx of   Arthritis    rheumatoid   Depression    on meds   Diabetes (HCC)    Diabetic peripheral neuropathy (HCC)    GERD (gastroesophageal reflux disease)    on meds   Headache    Hyperlipidemia    on meds   Hyperplastic rectal polyp 10/21/2015   Hypertension    pt. denies   Lymphocytic colitis    Neuropathy    Rheumatoid arthritis (HCC)    Shortness of breath dyspnea    with exertion   Past Surgical History:  Procedure Laterality Date   BREAST LUMPECTOMY Right 2018  BREAST LUMPECTOMY WITH RADIOACTIVE SEED LOCALIZATION Left 03/03/2016   Procedure: BREAST LUMPECTOMY WITH RADIOACTIVE SEED LOCALIZATION;  Surgeon: Abigail Miyamoto, MD;  Location[facility];  Service: General;  Laterality: Left;   COLONOSCOPY  08/2002   RMR: internal hemorrhoids   COLONOSCOPY N/A 10/21/2015   Procedure: COLONOSCOPY;  Surgeon: Corbin Ade, MD;  Location: [facility] SUITE;  Service: Endoscopy;  Laterality: N/A;  250 - moved to 2:35 - office to notify pt   CYST REMOVAL TRUNK     ESOPHAGOGASTRODUODENOSCOPY  08/2002   RMR: nonerosive reflux esophagitis, hiatal hernia   TONSILLECTOMY     WISDOM TOOTH EXTRACTION     Family History  Problem Relation Age of Onset   Alzheimer's disease Mother    Lung cancer Mother 40   Diabetes Father    Heart disease Father    Arthritis Sister    Diabetes Sister    Diabetes Brother    Hypertension Brother    Colon cancer Neg Hx    Stomach cancer Neg Hx    Colon polyps Neg Hx    Esophageal cancer Neg Hx    Rectal cancer Neg Hx    Social History   Socioeconomic History   Marital status: Significant Other    Spouse name: Not on file   Number of children: 1   Years of education: 11th   Highest education level: 11th grade  Occupational History   Occupation: retired  Tobacco Use   Smoking status: Former    Current  packs/day: 1.00    Average packs/day: 1 pack/day for 46.0 years (46.0 ttl pk-yrs)    Types: Cigarettes   Smokeless tobacco: Never  Vaping Use   Vaping status: Some Days  Substance and Sexual Activity   Alcohol use: No    Alcohol/week: 0.0 standard drinks of alcohol   Drug use: No   Sexual activity: Not Currently  Other Topics Concern   Not on file  Social History Narrative   The patient is divorced she has 1 son who is married that she has a grandchild.   She does not use alcohol or drugs she drinks 3-4 caffeinated beverages daily   She is a smoker   She lives with her boyfriend   Social Determinants of Corporate investment banker Strain: Low Risk  (04/20/2023)   Overall Financial Resource Strain (CARDIA)    Difficulty of Paying Living Expenses: Not hard at all  Food Insecurity: No Food Insecurity (04/20/2023)   Hunger Vital Sign    Worried About Running Out of Food in the Last Year: Never true    Ran Out of Food in the Last Year: Never true  Transportation Needs: No Transportation Needs (04/20/2023)   PRAPARE - Administrator, Civil Service (Medical): No    Lack of Transportation (Non-Medical): No  Physical Activity: Inactive (04/20/2023)   Exercise Vital Sign    Days of Exercise per Week: 0 days    Minutes of Exercise per Session: 0 min  Stress: No Stress Concern Present (04/20/2023)   [employer] of Occupational Health - Occupational Stress Questionnaire    Feeling of Stress : Not at all  Social Connections: Moderately Isolated (04/20/2023)   Social Connection and Isolation Panel [NHANES]    Frequency of Communication with Friends and Family: More than three times a week    Frequency of Social Gatherings with Friends and Family: More than three times a week    Attends Religious Services: Never  Active Member of Clubs or Organizations: No    Attends Banker Meetings: Never    Marital Status: Living with partner    Tobacco Counseling Counseling  given: Not Answered   Clinical Intake:  Pre-visit preparation completed: Yes  Pain : No/denies pain     Nutritional Risks: None Diabetes: Yes CBG done?: No Did pt. bring in CBG monitor from home?: No  How often do you need to have someone help you when you read instructions, pamphlets, or other written materials from your doctor or pharmacy?: 1 - Never  Interpreter Needed?: No  Information entered by :: Renie Ora, LPN   Activities of Daily Living    04/20/2023    2:40 PM  In your present state of health, do you have any difficulty performing the following activities:  Hearing? 0  Vision? 0  Difficulty concentrating or making decisions? 0  Walking or climbing stairs? 0  Dressing or bathing? 0  Doing errands, shopping? 0  Preparing Food and eating ? N  Using the Toilet? N  In the past six months, have you accidently leaked urine? N  Do you have problems with loss of bowel control? N  Managing your Medications? N  Managing your Finances? N  Housekeeping or managing your Housekeeping? N    Patient Care Team: Raliegh Ip, DO as PCP - General (Family Medicine) Randa Spike, Kelton Pillar, [name] as Social Worker (Licensed Clinical Social Worker) Cresenciano Genre, Lilla Shook, [facility] as Pharmacist (Family Medicine) Iva Boop, MD as Consulting Physician (Gastroenterology) [facility] (Specialist)  Indicate any recent Medical Services you may have received from other than [facility] providers in the past year (date may be approximate).     Assessment:   This is a routine wellness examination for Donna Rogers.  Hearing/Vision screen Vision Screening - Comments:: Wears rx glasses - up to date with routine eye exams with  Dr.Le   Dietary issues and exercise activities discussed:     Goals Addressed             This Visit's Progress    Exercise 3x per week (30 min per time)         Depression Screen    04/20/2023    2:38 PM 03/12/2023    2:32 PM 03/05/2023   10:32 AM 11/16/2022     1:03 PM 10/01/2022    1:42 PM 08/20/2022    1:02 PM 06/05/2022   11:01 AM  PHQ 2/9 Scores  PHQ - 2 Score 0 0 2 0 0 0 6  PHQ- 9 Score 0  9    16    Fall Risk    04/20/2023    2:35 PM 03/12/2023    2:31 PM 03/05/2023   10:24 AM 11/16/2022    1:03 PM 10/01/2022    1:40 PM  Fall Risk   Falls in the past year? 0 0 0 1 0  Number falls in past yr: 0 0 0 0   Injury with Fall? 0  0 0   Risk for fall due to : No Fall Risks  No Fall Risks    Follow up Falls prevention discussed  Education provided      MEDICARE RISK AT HOME: Medicare Risk at Home Any stairs in or around the home?: No If so, are there any without handrails?: No Home free of loose throw rugs in walkways, pet beds, electrical cords, etc?: Yes Adequate lighting in your home to reduce risk of falls?: Yes Life alert?:  No Use of a cane, walker or w/c?: Yes Grab bars in the bathroom?: Yes Shower chair or bench in shower?: Yes Elevated toilet seat or a handicapped toilet?: Yes  TIMED UP AND GO:  Was the test performed?  No    Cognitive Function:        04/20/2023    2:40 PM 04/16/2022    3:09 PM 04/15/2021    4:11 PM 10/31/2019    2:39 PM  6CIT Screen  What Year? 0 points 0 points 0 points 0 points  What month? 0 points 0 points 0 points 0 points  What time? 0 points 0 points 0 points 0 points  Count back from 20 0 points 0 points 0 points 0 points  Months in reverse 0 points 0 points 4 points 0 points  Repeat phrase 0 points 0 points 0 points 0 points  Total Score 0 points 0 points 4 points 0 points    Immunizations Immunization History  Administered Date(s) Administered   Fluad Quad(high Dose 65+) 05/03/2019, 05/04/2022, 08/05/2022   Influenza,inj,Quad PF,6+ Mos 05/25/2013, 07/31/2015, 06/18/2016, 05/16/2018   Influenza-Unspecified 05/07/2019, 07/24/2020, 05/08/2021   PPD Test 05/08/2013   Pneumococcal Conjugate-13 05/16/2018   Pneumococcal Polysaccharide-23 07/03/2019   Tdap 12/28/2018   Zoster  Recombinant(Shingrix) 06/06/2019, 06/30/2019, 10/27/2019    TDAP status: Up to date  Flu Vaccine status: Up to date  Pneumococcal vaccine status: Up to date  Covid-19 vaccine status: Completed vaccines  Qualifies for Shingles Vaccine? Yes   Zostavax completed Yes   Shingrix Completed?: Yes  Screening Tests Health Maintenance  Topic Date Due   OPHTHALMOLOGY EXAM  04/04/2022   FOOT EXAM  11/20/2022   INFLUENZA VACCINE  03/18/2023   DEXA SCAN  04/05/2023   COVID-19 Vaccine (1 - 2023-24 season) Never done   MAMMOGRAM  05/05/2023 (Originally 12/19/2017)   HEMOGLOBIN A1C  09/05/2023   Lung Cancer Screening  02/01/2024   Diabetic kidney evaluation - eGFR measurement  03/04/2024   Diabetic kidney evaluation - Urine ACR  03/04/2024   Medicare Annual Wellness (AWV)  04/19/2024   Colonoscopy  10/20/2025   DTaP/Tdap/Td (2 - Td or Tdap) 12/27/2028   Pneumonia Vaccine 42+ Years old  Completed   Hepatitis C Screening  Completed   Zoster Vaccines- Shingrix  Completed   HPV VACCINES  Aged Out    Health Maintenance  Health Maintenance Due  Topic Date Due   OPHTHALMOLOGY EXAM  04/04/2022   FOOT EXAM  11/20/2022   INFLUENZA VACCINE  03/18/2023   DEXA SCAN  04/05/2023   COVID-19 Vaccine (1 - 2023-24 season) Never done    Colorectal cancer screening: Type of screening: Colonoscopy. Completed 10/21/2015. Repeat every 10 years  Mammogram status: Ordered 04/20/2023. Pt provided with contact info and advised to call to schedule appt.   Bone Density status: Ordered 04/20/2023. Pt provided with contact info and advised to call to schedule appt.  Lung Cancer Screening: (Low Dose CT Chest recommended if Age 56-80 years, 20 pack-year currently smoking OR have quit w/in 15years.) does qualify.   Lung Cancer Screening Referral: declines   Additional Screening:  Hepatitis C Screening: does not qualify; Completed 08/03/2018  Vision Screening: Recommended annual ophthalmology exams for early  detection of glaucoma and other disorders of the eye. Is the patient up to date with their annual eye exam?  Yes  Who is the provider or what is the name of the office in which the patient attends annual eye exams? Dr.le  If  pt is not established with a provider, would they like to be referred to a provider to establish care? No .   Dental Screening: Recommended annual dental exams for proper oral hygiene   Community Resource Referral / Chronic Care Management: CRR required this visit?  No   CCM required this visit?  No     Plan:     I have personally reviewed and noted the following in the patient's chart:   Medical and social history Use of alcohol, tobacco or illicit drugs  Current medications and supplements including opioid prescriptions. Patient is not currently taking opioid prescriptions. Functional ability and status Nutritional status Physical activity Advanced directives List of other physicians Hospitalizations, surgeries, and ER visits in previous 12 months Vitals Screenings to include cognitive, depression, and falls Referrals and appointments  In addition, I have reviewed and discussed with patient certain preventive protocols, quality metrics, and best practice recommendations. A written personalized care plan for preventive services as well as general preventive health recommendations were provided to patient.     Lorrene Reid, LPN   4/0/9811   After Visit Summary: (MyChart) Due to this being a telephonic visit, the after visit summary with patients personalized plan was offered to patient via MyChart   Nurse Notes: none

## 2023-04-20 NOTE — Telephone Encounter (Signed)
Patient states that she normally gets her lab work done here that she needs for her rheumatology and we fax it to her doctor.  Wanted to know if the labs she needs were done the last time she was seen?  I reviewed the labs that were taken with patient but she does not know what labs we normally do for rheumatology.  States that Dr. Reece Agar will know.  If they were done she would like them faxed and will have the fax number when we return call.

## 2023-04-20 NOTE — Telephone Encounter (Signed)
Pt called requesting to speak with Dr Jeannett Senior nurse regarding her lab work for Rheumatoid Arthritis.

## 2023-04-20 NOTE — Telephone Encounter (Signed)
Patient aware and verbalizes understanding.  -Faxed to [phone] as requested.

## 2023-04-21 ENCOUNTER — Encounter: Payer: Self-pay | Admitting: Family Medicine

## 2023-04-21 DIAGNOSIS — M8580 Other specified disorders of bone density and structure, unspecified site: Secondary | ICD-10-CM | POA: Diagnosis not present

## 2023-04-21 DIAGNOSIS — M5136 Other intervertebral disc degeneration, lumbar region: Secondary | ICD-10-CM | POA: Diagnosis not present

## 2023-04-21 DIAGNOSIS — R5382 Chronic fatigue, unspecified: Secondary | ICD-10-CM | POA: Diagnosis not present

## 2023-04-21 DIAGNOSIS — M1991 Primary osteoarthritis, unspecified site: Secondary | ICD-10-CM | POA: Diagnosis not present

## 2023-04-21 DIAGNOSIS — K52832 Lymphocytic colitis: Secondary | ICD-10-CM | POA: Diagnosis not present

## 2023-04-21 DIAGNOSIS — M0579 Rheumatoid arthritis with rheumatoid factor of multiple sites without organ or systems involvement: Secondary | ICD-10-CM | POA: Diagnosis not present

## 2023-05-05 NOTE — Progress Notes (Signed)
05/06/2023 Name: Donna Rogers MRN: [id number] DOB: Jan 01, 1951  Chief Complaint  Patient presents with   Diabetes   Subjective:  Donna Rogers is a 72 y.o. year old female with PMHx of T2DM, HTN, HLD, RA, OA, and memory issues who presented for a telephone visit.  I connected with  Donna Rogers on 05/06/23 by telephone and verified that I am speaking with the correct person using two identifiers. I discussed the limitations of evaluation and management by telemedicine. The patient expressed understanding and agreed to proceed.  Patient was located in her home and PharmD in PCP office during this visit.    They were referred to the pharmacist by their PCP for assistance in managing diabetes, managing diabetes, medication access, complex medication management. Per companion Donna Rogers) Pat's libre sensor fell out prematurely. She has since been taking her BG using a fingerstick meter. She is still only using 10 units of Tresiba at bedtime. Her BG this morning was ~200  Care Team: Primary Care Provider: Raliegh Ip, DO ; Next Scheduled Visit: 06/07/2023  Medication Access/Adherence  Current Pharmacy:  [facility] [place], [place] - [address] [place] [address] Phone: [phone] Fax: [phone]  [facility] - [place], [place] - [address] [place] [address] [place] [address] Phone: [phone] Fax: [phone]  [id number] - [place], [place] - [address] [place] [address] Phone: [phone] Fax: [phone]   Patient reports affordability concerns with their medications: No  Patient reports access/transportation concerns to their pharmacy: No  Patient reports adherence concerns with their medications:  No     Diabetes:  Current medications:  Tresiba 12 units at bedtime (increased from 10 units)-never increased to 12 units  Glpizide XL 5 mg twice daily  Ozempic  1 mg weekly   Medications tried in the past:  Metformin (GI intolerance) Rybelsus (switched to Ozempic) Glyburide Farxiga 10 mg daily (holding for recurrent yeast infections?)  Current glucose readings:  Accu Check Guide Meter System  Patient denies hypoglycemic s/sx including dizziness, shakiness, sweating. Patient denies hyperglycemic symptoms including polyuria, polydipsia, polyphagia, nocturia, neuropathy, blurred vision.   Objective:  Lab Results  Component Value Date   HGBA1C 13.5 (H) 03/05/2023    Lab Results  Component Value Date   CREATININE 0.85 03/05/2023   BUN 9 03/05/2023   NA 139 03/05/2023   K 4.0 03/05/2023   CL 98 03/05/2023   CO2 25 03/05/2023    Lab Results  Component Value Date   CHOL 141 03/05/2023   HDL 31 (L) 03/05/2023   LDLCALC 75 03/05/2023   TRIG 205 (H) 03/05/2023   CHOLHDL 4.5 (H) 03/05/2023    Medications Reviewed Today     Reviewed by Donna Rogers, RPH (Pharmacist) on 05/06/23 at 1355  Med List Status: <None>   Medication Order Taking? Sig Documenting Provider Last Dose Status Informant  Accu-Chek Softclix Lancets lancets [id number] Yes USE TO check blood sugar TWICE DAILY [provider] Taking Active   Alpha-Lipoic Acid 600 MG CAPS [id number] No TAKE (1) CAPSULE DAILY FOR NERVE PAIN  Patient not taking: Reported on 05/06/2023   Donna Ip, DO Not Taking Active   Ascorbic Acid (VITAMIN C) 500 MG CHEW [id number]  1 tablet [provider]  Active   aspirin 81 MG tablet [id number]  Take 81 mg by mouth daily. [provider]  Active Self  atenolol (TENORMIN) 25 MG tablet [id number]  Take 1 tablet (25 mg total) by mouth daily. Donna Flavin M, DO  Active   blood glucose meter kit and supplies [id number] Yes Dispense based on patient and insurance preference. Use up to four times daily as directed. (FOR ICD-10 E10.9, E11.9). Donna Flavin M, DO Taking Active   buPROPion (WELLBUTRIN XL) 300 MG 24  hr tablet [id number]  Take 1 tablet (300 mg total) by mouth daily. Donna Flavin M, DO  Active   busPIRone (BUSPAR) 10 MG tablet [id number]  TAKE (1) TABLET TWICE A DAY. Donna Flavin M, DO  Active   cholecalciferol (VITAMIN D) 1000 UNITS tablet [id number]  Take 1,000 Units by mouth daily. [provider]  Active Self  citalopram (CELEXA) 40 MG tablet [id number]  TAKE ONE TABLET ONCE DAILY Donna Flavin M, DO  Active   Continuous Glucose Receiver (FREESTYLE LIBRE READER) DEVI [id number] No 1 Units by Does not apply route daily. UAD to test BGs daily. Dx E11.65  Patient not taking: Reported on 05/06/2023   Donna Ip, DO Not Taking Active            Med Note Donna Rogers May 06, 2023  1:54 PM) Sensor fell out prematurely   Continuous Glucose Sensor (FREESTYLE LIBRE 3 SENSOR) [place] [id number] No Place 1 sensor on the skin every 14 days. Use to check glucose continuously E11.65  Patient not taking: Reported on 05/06/2023   Donna Ip, DO Not Taking Active            Med Note Donna Rogers May 06, 2023  1:54 PM) Sensor fell out prematurely   Cyanocobalamin (VITAMIN B 12 PO) [id number]  Take by mouth. [provider]  Active   diclofenac Sodium (VOLTAREN ARTHRITIS PAIN) 1 % GEL [id number]  Apply 2 g topically 4 (four) times daily. Donna Dance, MD  Active   fluconazole (DIFLUCAN) 150 MG tablet [id number]  Take 1 tablet now and repeat dose in 3 days Gottschalk, Ashly M, DO  Active   folic acid (FOLVITE) 1 MG tablet [id number]  Take 1 mg by mouth daily. [provider]  Active   glipiZIDE (GLUCOTROL XL) 5 MG 24 hr tablet [id number] Yes Take 1 tablet (5 mg total) by mouth 2 (two) times daily with a meal. Donna Flavin M, DO Taking Active   glucose blood test strip [id number]  Use as instructed to check sugar twice daily E11.9 Gottschalk, Ashly M, DO  Active   insulin degludec (TRESIBA FLEXTOUCH) 100 UNIT/ML FlexTouch Pen  [id number] Yes Inject 10-20 Units into the skin at bedtime. BRING TO APPT WITH Donna Rogers.  She will show how to start. Donna Ip, DO Taking Active            Med Note Donna Rogers May 06, 2023  1:54 PM) Still only taking 10 units at bedtime. Advised to increase to 15 units   Insulin Pen Needle (NOVOFINE PLUS PEN NEEDLE) 32G X 4 MM MISC [id number]  UAD to inject insulin. E11.65 Donna Ip, DO  Active   Lancet Device MISC [id number]  Check sugar 2 times daily E11.9 Donna Flavin M, DO  Active   losartan (COZAAR) 100 MG tablet [id number]  Take 1 tablet (100 mg total) by mouth daily. Donna Flavin M, DO  Active   methotrexate (RHEUMATREX) 2.5 MG tablet [id number]  6 tablets Orally once weekly on the  same day for 90 days [provider]  Active   metroNIDAZOLE (FLAGYL) 500 MG tablet [id number]  Take 1 tablet (500 mg total) by mouth 2 (two) times daily. Donna Flavin M, DO  Active   omeprazole (PRILOSEC) 40 MG capsule [id number]  Take 1 capsule (40 mg total) by mouth daily. Donna Flavin M, DO  Active   oxyCODONE-acetaminophen (PERCOCET) 7.5-325 MG tablet [id number]  Take 1 tablet by mouth every 8 (eight) hours as needed for severe pain. Donna Dance, MD  Active            Med Note Kelli Churn, Sherlynn Carbon   Fri Mar 12, 2023  2:33 PM) Two bottles:  12/18/22 #90 today #31   02/15/23 #90 today #90 last taken 2 days ago  pregabalin (LYRICA) 200 MG capsule [id number]  TAKE ONE CAPSULE BY MOUTH TWICE DAILY Donna Dance, MD  Active   Pyridoxine HCl (VITAMIN B-6 PO) [id number]  Take by mouth. [provider]  Active   Semaglutide,0.25 or 0.5MG /DOS, (OZEMPIC, 0.25 OR 0.5 MG/DOSE,) 2 MG/3ML SOPN [id number] Yes Inject 1 mg into the skin every 7 (seven) days. Donna Ip, DO Taking Active            Med Note Cresenciano Genre, Okey Dupre Nov 26, 2022  8:11 PM) Via novo nordisk patient assistance  simvastatin (ZOCOR) 40 MG tablet [id number]  Take 1 tablet (40 mg  total) by mouth daily. Donna Flavin M, DO  Active   sulfaSALAzine (AZULFIDINE) 500 MG tablet [id number]  Take 500 mg by mouth 2 (two) times daily. [provider]  Active   Suvorexant (BELSOMRA) 10 MG TABS [id number]  Take 10 mg by mouth at bedtime as needed. Donna Flavin M, DO  Active   tiZANidine (ZANAFLEX) 4 MG tablet [id number]  Take 0.5-1 tablets (2-4 mg total) by mouth every 8 (eight) hours as needed for muscle spasms. Donna Flavin M, DO  Active               Assessment/Plan:   Diabetes: - Currently uncontrolled. A1c markedly elevated (13.5%). Due patient's age and complex health status consider A1c goal of <8%. Her BG is slightly improved compared to last visit. BG this morning was ~200.  -Recommend to increase Tresiba to 15 units at bedtime. Will consider increasing Ozempic to 2 mg weekly in the future.  - Reviewed long term cardiovascular and renal outcomes of uncontrolled blood sugar - Recommend to check glucose with regular meter 1-3 times per day with BG meter.   Follow Up Plan: She will return to the office to see Dr. Nadine Counts on 06/08/2023. At that time she and Donna Rogers will bring her medications in for review.   Sofie Rower, PharmD [facility] PGY1  Kieth Brightly, PharmD, BCACP, CPP Clinical Pharmacist, [facility]

## 2023-05-06 ENCOUNTER — Telehealth: Payer: Medicare Other

## 2023-05-06 ENCOUNTER — Ambulatory Visit (INDEPENDENT_AMBULATORY_CARE_PROVIDER_SITE_OTHER): Payer: Medicare Other | Admitting: Pharmacist

## 2023-05-06 DIAGNOSIS — Z794 Long term (current) use of insulin: Secondary | ICD-10-CM

## 2023-05-06 DIAGNOSIS — E119 Type 2 diabetes mellitus without complications: Secondary | ICD-10-CM

## 2023-05-13 ENCOUNTER — Encounter: Payer: Medicare Other | Attending: Physical Medicine & Rehabilitation | Admitting: Physical Medicine & Rehabilitation

## 2023-05-13 ENCOUNTER — Encounter: Payer: Self-pay | Admitting: Physical Medicine & Rehabilitation

## 2023-05-13 VITALS — BP 139/81 | HR 89 | Ht 61.0 in | Wt 207.0 lb

## 2023-05-13 DIAGNOSIS — G894 Chronic pain syndrome: Secondary | ICD-10-CM

## 2023-05-13 DIAGNOSIS — Z79891 Long term (current) use of opiate analgesic: Secondary | ICD-10-CM | POA: Diagnosis not present

## 2023-05-13 DIAGNOSIS — E1142 Type 2 diabetes mellitus with diabetic polyneuropathy: Secondary | ICD-10-CM | POA: Diagnosis not present

## 2023-05-13 DIAGNOSIS — Z794 Long term (current) use of insulin: Secondary | ICD-10-CM

## 2023-05-13 DIAGNOSIS — M545 Low back pain, unspecified: Secondary | ICD-10-CM | POA: Diagnosis not present

## 2023-05-13 DIAGNOSIS — M25511 Pain in right shoulder: Secondary | ICD-10-CM | POA: Diagnosis not present

## 2023-05-13 DIAGNOSIS — G8929 Other chronic pain: Secondary | ICD-10-CM | POA: Insufficient documentation

## 2023-05-13 NOTE — Progress Notes (Signed)
Subjective:    Patient ID: Donna Rogers, female    DOB: 1951-05-11, 72 y.o.   MRN: [id number]  HPI   HPI on 06/05/22 Donna Rogers is a 72 yo female with PMH of rheumatoid arthritis, chronic low back pain, diabetes mellitus type 2 with polyneuropathy, GERD, carpal tunnel right wrist, depression who is here for chronic pain.  Patient reports that she has been having burning and freezing pain in her feet that has been worsening over the past few years.  She says she feels like her feet are on ice.  Pain is worse on the dorsal and plantar aspects of her feet however she also has this pain over her ankles.  She was seen in the past by Dr. Ethelene Hal for lumbar spine arthritis and reports that she had epidural steroid injections x2.  She reports the first injection helped a little bit but the second injection did not provide any relief.  She continues to have sharp and aching pain in her lower back and sometimes pain shoots into her thighs and legs.  Back pain is worsened with walking or activities.  Additionally she has severe pain in her right shoulder.  She reports shoulder injection did not help the pain.  Shoulder pain is worsened with movement.  She additionally has pain in both of her hips hands, knees.  Pain makes it difficult to sleep.  She is followed by rheumatology by Dr. Alben Deeds and Azucena Fallen and is currently treated with methotrexate.  She says that she was was previously on Enbrel.   Prior treatments Tylenol and ibuprofen in past helped slightly Diclofenac oral did not help Blu Emu cream- helps a little Gabapentin 1200mg  BID to TID with mild to moderate improvement She has not tried Lyrica Hydrocodone helped in the past, it stopped working  She has not tried oxycodone Tizanidine did not help   Interval History Donna Rogers is here for follow-up due to her chronic pain.  She reports that her polyneuropathy is much improved since starting Lyrica.  She feels that this  works much better than the gabapentin.  She says she would like to try Qutenza however cost is prohibitive at this time.  She continues to have lower back pain and shoulder pain bilaterally that limits her activities.  The lower back pain did not improve significantly with the change to Lyrica.  She has not had any side effects with this medication.  She reports she previously tried tramadol and it initially helped mildly but later did not provide significant benefit.  She will occasionally get a tingling sensation in the fourth and fifth digits on both hands but this is not present currently.  She feels like she has poor balance overall however has not had any recent falls.  She does not use a cane for ambulation at this time but she has been home.     Interval History 10/01/22 Donna Rogers is here for follow-up regarding her chronic pain.  She reports that oxycodone is helping to control her lower back pain.  She uses this medication sparingly only when the pain is very severe.  The medication allows her to do more active activities and tasks at her house.  She continues to take Lyrica for burning sensation in her feet.  She does feel like the medication is not helping as much as it did when we spoke at her last visit.  Lyrica does not currently give her the drunk feeling she was getting when  she is taking gabapentin.  She denies any history of CKD or known history of kidney dysfunction.  She continues to have right shoulder pain.  She says that she would like to repeat the right shoulder injection.  She previously had this completed by her orthopedic doctor.  She asked if we can completed at her next visit if possible.     Interval History 4/1 Donna Rogers is here regarding her chronic pain and for a right shoulder injection.  She reports that oxycodone has been providing benefit to her pain however she feels like the 5 mg dose is not strong enough.  Previously when she tried 2 tablets of the oxycodone 5  mg this provided better relief for her pain.  She continues to have back pain that is sometimes shooting down her right leg.  She continues to have pain in her right shoulder that is limiting.  She does not have any side effects with these are working well for her and not.     Interval history 02/05/2023 Donna Rogers is here for follow-up regarding her chronic low back pain, shoulder pain and polyneuropathy.  She reports her shoulder is doing much better since we did the shoulder injection last visit.  Oxycodone 7.5 3 times daily is helping her pain stay very controlled.  She continues to have a lot of lumbar back pain however it is more tolerable with this medication.  For the past few months she has been using Lyrica 150 mg twice daily noted on PDMP review.  In February and March she was getting 200 mg of Lyrica twice daily however I think she may have reverted back to an older prescription and started taking up to 150 mg tabs from the pharmacy.  She is unsure what kind of difference is made to her pain.  She does continue to have pain in her feet that has been attributed to polyneuropathy.     Interval history 03/12/2023 Donna Rogers is here for follow-up regarding her chronic lower back pain, right shoulder pain and polyneuropathy.  Patient feels that her pain is doing better overall since her last visit.  She does ask when she can do her repeat shoulder injection as the pain is gradually coming back.  She does not feel like she needs an injection today because pain is still overall controlled.  She reports current medications are working well for her and not causing any significant side effects.  Patient reports she is using Percocet sparingly and this appears consistent with her pill count.  Interval history 05/13/2023 Donna Rogers is here for follow-up regarding her chronic lower back pain, right shoulder pain and polyneuropathy.  Patient reports that she continues to have pain in these areas however it is  doing better than it was previously.  She has not needed to use this many tabs of oxycodone and still has 90 tabs left today.  She reports her shoulder pain is under control as well.  She does have numbness and pain in her feet, she reports a cold sensation in her feet.  When she does need oxycodone she reports it is helping her pain.  Pain Inventory Average Pain 5 Pain Right Now 6 My pain is sharp, stabbing, and aching  In the last 24 hours, has pain interfered with the following? General activity 2 Relation with others 6 Enjoyment of life 1 What TIME of day is your pain at its worst? morning , daytime, evening, and night Sleep (in general) Fair  Pain is worse with: walking, inactivity, standing, and some activites Pain improves with: rest and medication Relief from Meds: 7  Family History  Problem Relation Age of Onset   Alzheimer's disease Mother    Lung cancer Mother 68   Diabetes Father    Heart disease Father    Arthritis Sister    Diabetes Sister    Diabetes Brother    Hypertension Brother    Colon cancer Neg Hx    Stomach cancer Neg Hx    Colon polyps Neg Hx    Esophageal cancer Neg Hx    Rectal cancer Neg Hx    Social History   Socioeconomic History   Marital status: Significant Other    Spouse name: Not on file   Number of children: 1   Years of education: 11th   Highest education level: 11th grade  Occupational History   Occupation: retired  Tobacco Use   Smoking status: Former    Current packs/day: 1.00    Average packs/day: 1 pack/day for 46.0 years (46.0 ttl pk-yrs)    Types: Cigarettes   Smokeless tobacco: Never  Vaping Use   Vaping status: Some Days  Substance and Sexual Activity   Alcohol use: No    Alcohol/week: 0.0 standard drinks of alcohol   Drug use: No   Sexual activity: Not Currently  Other Topics Concern   Not on file  Social History Narrative   The patient is divorced she has 1 son who is married that she has a grandchild.   She  does not use alcohol or drugs she drinks 3-4 caffeinated beverages daily   She is a smoker   She lives with her boyfriend   Social Determinants of Corporate investment banker Strain: Low Risk  (04/20/2023)   Overall Financial Resource Strain (CARDIA)    Difficulty of Paying Living Expenses: Not hard at all  Food Insecurity: No Food Insecurity (04/20/2023)   Hunger Vital Sign    Worried About Running Out of Food in the Last Year: Never true    Ran Out of Food in the Last Year: Never true  Transportation Needs: No Transportation Needs (04/20/2023)   PRAPARE - Administrator, Civil Service (Medical): No    Lack of Transportation (Non-Medical): No  Physical Activity: Inactive (04/20/2023)   Exercise Vital Sign    Days of Exercise per Week: 0 days    Minutes of Exercise per Session: 0 min  Stress: No Stress Concern Present (04/20/2023)   [employer] of Occupational Health - Occupational Stress Questionnaire    Feeling of Stress : Not at all  Social Connections: Moderately Isolated (04/20/2023)   Social Connection and Isolation Panel [NHANES]    Frequency of Communication with Friends and Family: More than three times a week    Frequency of Social Gatherings with Friends and Family: More than three times a week    Attends Religious Services: Never    Database administrator or Organizations: No    Attends Banker Meetings: Never    Marital Status: Living with partner   Past Surgical History:  Procedure Laterality Date   BREAST LUMPECTOMY Right 2018   BREAST LUMPECTOMY WITH RADIOACTIVE SEED LOCALIZATION Left 03/03/2016   Procedure: BREAST LUMPECTOMY WITH RADIOACTIVE SEED LOCALIZATION;  Surgeon: Abigail Miyamoto, MD;  Location: [facility] OR;  Service: General;  Laterality: Left;   COLONOSCOPY  08/2002   RMR: internal hemorrhoids   COLONOSCOPY N/A 10/21/2015   Procedure: COLONOSCOPY;  Surgeon: Corbin Ade, MD;  Location: [facility];  Service: Endoscopy;  Laterality: N/A;   250 - moved to 2:35 - office to notify pt   CYST REMOVAL TRUNK     ESOPHAGOGASTRODUODENOSCOPY  08/2002   RMR: nonerosive reflux esophagitis, hiatal hernia   TONSILLECTOMY     WISDOM TOOTH EXTRACTION     Past Surgical History:  Procedure Laterality Date   BREAST LUMPECTOMY Right 2018   BREAST LUMPECTOMY WITH RADIOACTIVE SEED LOCALIZATION Left 03/03/2016   Procedure: BREAST LUMPECTOMY WITH RADIOACTIVE SEED LOCALIZATION;  Surgeon: Abigail Miyamoto, MD;  Location: [facility] OR;  Service: General;  Laterality: Left;   COLONOSCOPY  08/2002   RMR: internal hemorrhoids   COLONOSCOPY N/A 10/21/2015   Procedure: COLONOSCOPY;  Surgeon: Corbin Ade, MD;  Location: [facility];  Service: Endoscopy;  Laterality: N/A;  250 - moved to 2:35 - office to notify pt   CYST REMOVAL TRUNK     ESOPHAGOGASTRODUODENOSCOPY  08/2002   RMR: nonerosive reflux esophagitis, hiatal hernia   TONSILLECTOMY     WISDOM TOOTH EXTRACTION     Past Medical History:  Diagnosis Date   Allergy    seasonal allergies   Anemia    hx of   Arthritis    rheumatoid   Depression    on meds   Diabetes (HCC)    Diabetic peripheral neuropathy (HCC)    GERD (gastroesophageal reflux disease)    on meds   Headache    Hyperlipidemia    on meds   Hyperplastic rectal polyp 10/21/2015   Hypertension    pt. denies   Lymphocytic colitis    Neuropathy    Rheumatoid arthritis (HCC)    Shortness of breath dyspnea    with exertion   Ht 5\' 1"  (1.549 m)   Wt 207 lb (93.9 kg)   BMI 39.11 kg/m   Opioid Risk Score:   Fall Risk Score:  `1  Depression screen PHQ 2/9     05/13/2023    1:54 PM 04/20/2023    2:38 PM 03/12/2023    2:32 PM 03/05/2023   10:32 AM 11/16/2022    1:03 PM 10/01/2022    1:42 PM 08/20/2022    1:02 PM  Depression screen PHQ 2/9  Decreased Interest 0 0 0 1 0 0 0  Down, Depressed, Hopeless 0 0 0 1 0 0 0  PHQ - 2 Score 0 0 0 2 0 0 0  Altered sleeping  0  3     Tired, decreased energy  0  3     Change in appetite  0  0      Feeling bad or failure about yourself   0  1     Trouble concentrating  0  0     Moving slowly or fidgety/restless  0  0     Suicidal thoughts  0  0     PHQ-9 Score  0  9     Difficult doing work/chores  Not difficult at all  Somewhat difficult         Review of Systems  Musculoskeletal:  Positive for back pain.      Objective:   Physical Exam   Physical Exam Gen: no distress, normal appearing HEENT: oral mucosa pink and moist, NCAT Chest: normal effort, normal rate of breathing Abd: soft, non-distended Ext: no edema Psych: pleasant, appropriate Skin: intact Neuro: CN 2-12 grossly intact, follows commands No focal motor deficits noted SLR negative on  the left, she did report hamstring tightness and shooting pain to report on the right Decreased sensation to LT in b/l LE below the knee   Musculoskeletal: Right shoulder ROM improved from prior visit Pain with internal rotation of right shoulder Mild paraspinal tenderness C spine T spine and L spine Mild tenderness reported at b/l Knee joint line     Lumbar xray 08/26/17 FINDINGS: Five lumbar type vertebral bodies are well visualized. Vertebral body height is well maintained. Multilevel disc space narrowing is noted throughout the lumbar spine progressed in the interval from the prior exam. Osteophytic changes are noted as well. No anterolisthesis is seen. A somewhat rounded calcification is noted in the right upper quadrant which may be related to cholelithiasis. Clinical correlation is recommended.   IMPRESSION: Multilevel degenerative changes progressed when compared with the prior exam of 20/8.   Rounded calcification in the right upper quadrant may represent cholelithiasis.       MRI shoulder 08/22/21 IMPRESSION: 1. Full-thickness, full width tears of the supraspinatus and infraspinatus tendons with retraction to the glenoid. 2. Full-thickness subscapularis tear involving the mid to superior fibers with  tendon gap up to 1.0 cm, and interstitial extension proximally. 3. Grade 2 muscle atrophy of the supraspinatus, infraspinatus, and subscapularis muscles. 4. Moderate glenohumeral and acromioclavicular joint osteoarthritis. 5. Likely torn and retracted long head biceps tendon. 6. Degenerative posterosuperior labral tearing.           Assessment & Plan:     Chronic lower back pain, lumbar spondylosis -UDS and pain agreement completed prior visit -Continue oxycodone 7.5mg  Q8h prn #90-advised to call several days before due for refill -Consider repeat lumbar spine MRI if pain worsens further-discussed this again today although pain appears to be controlled -Continue UDS random, PDMP monitoring -Pill count consistent today-she has 90 tabs -Continue use of TENS -Prior UDS consistent    Right shoulder pain with biceps SS, IS and Subscapularis tears, GH and AC arthritis  -Continue medications as above -Voltaren gel as needed -Shoulder injection completed prior visit.       DM with polyneuropathy -Consider Qutenza, discussed option with patient.  She says she will consider if pain worsens -Cont Lyrica to 200mg  BID  Intermittent numbness in her fourth and fifth digit, Possibly some ulnar nerve irritation,  Continue to monitor.   Rheumatoid arthritis and polyarthralgia history -Continue f/u with rheumatology

## 2023-06-07 ENCOUNTER — Other Ambulatory Visit: Payer: Medicare Other

## 2023-06-07 ENCOUNTER — Ambulatory Visit: Payer: Medicare Other | Admitting: Family Medicine

## 2023-06-09 ENCOUNTER — Encounter: Payer: Self-pay | Admitting: Family Medicine

## 2023-06-16 ENCOUNTER — Telehealth: Payer: Self-pay | Admitting: Family Medicine

## 2023-06-16 DIAGNOSIS — Z0279 Encounter for issue of other medical certificate: Secondary | ICD-10-CM

## 2023-06-17 ENCOUNTER — Encounter: Payer: Self-pay | Admitting: Family Medicine

## 2023-06-17 NOTE — Telephone Encounter (Signed)
Aware handicap form ready

## 2023-06-29 ENCOUNTER — Telehealth: Payer: Self-pay

## 2023-06-29 NOTE — Telephone Encounter (Signed)
Tresiba U100- received and left patient detailed message.

## 2023-06-29 NOTE — Telephone Encounter (Signed)
Also received Ozempic 2 mg and pen needles from [employer].   Nils Pyle, PharmD PGY1 Pharmacy Resident

## 2023-07-02 ENCOUNTER — Telehealth: Payer: Self-pay | Admitting: Pharmacist

## 2023-07-02 MED ORDER — SEMAGLUTIDE (2 MG/DOSE) 8 MG/3ML ~~LOC~~ SOPN: 2.0000 mg | PEN_INJECTOR | SUBCUTANEOUS | Status: AC

## 2023-07-02 NOTE — Telephone Encounter (Signed)
Patient now on Ozempic 2mg  weekly; tolerating well Spoke with significant other re: Blood sugars (no hypoglycemia) Tresiba 20 units daily Continue current therapy Appt with pharmd next thursday

## 2023-07-07 ENCOUNTER — Other Ambulatory Visit: Payer: Self-pay | Admitting: Family Medicine

## 2023-07-07 DIAGNOSIS — E1142 Type 2 diabetes mellitus with diabetic polyneuropathy: Secondary | ICD-10-CM

## 2023-07-08 ENCOUNTER — Other Ambulatory Visit: Payer: Medicare Other

## 2023-07-08 ENCOUNTER — Telehealth: Payer: Self-pay

## 2023-07-08 NOTE — Patient Outreach (Signed)
Successful call to patient on today regarding preventative mammogram screening. Patient scheduled for mobile bus at [facility] 08/06/23 at 1:30 p.m.   Baruch Gouty [facility] Assistant [facility] Population [facility] [phone]

## 2023-07-28 ENCOUNTER — Other Ambulatory Visit: Payer: Medicare Other

## 2023-07-28 ENCOUNTER — Encounter: Payer: Self-pay | Admitting: Family Medicine

## 2023-07-28 ENCOUNTER — Ambulatory Visit: Payer: Medicare Other | Admitting: Family Medicine

## 2023-07-28 VITALS — BP 130/79 | HR 73 | Temp 98.2°F | Ht 61.0 in | Wt 208.0 lb

## 2023-07-28 DIAGNOSIS — R0683 Snoring: Secondary | ICD-10-CM | POA: Diagnosis not present

## 2023-07-28 DIAGNOSIS — Z23 Encounter for immunization: Secondary | ICD-10-CM

## 2023-07-28 DIAGNOSIS — M0579 Rheumatoid arthritis with rheumatoid factor of multiple sites without organ or systems involvement: Secondary | ICD-10-CM

## 2023-07-28 DIAGNOSIS — Z72820 Sleep deprivation: Secondary | ICD-10-CM | POA: Diagnosis not present

## 2023-07-28 DIAGNOSIS — E785 Hyperlipidemia, unspecified: Secondary | ICD-10-CM | POA: Diagnosis not present

## 2023-07-28 DIAGNOSIS — Z794 Long term (current) use of insulin: Secondary | ICD-10-CM | POA: Diagnosis not present

## 2023-07-28 DIAGNOSIS — E1169 Type 2 diabetes mellitus with other specified complication: Secondary | ICD-10-CM

## 2023-07-28 DIAGNOSIS — E1159 Type 2 diabetes mellitus with other circulatory complications: Secondary | ICD-10-CM | POA: Diagnosis not present

## 2023-07-28 DIAGNOSIS — E119 Type 2 diabetes mellitus without complications: Secondary | ICD-10-CM

## 2023-07-28 DIAGNOSIS — E1142 Type 2 diabetes mellitus with diabetic polyneuropathy: Secondary | ICD-10-CM | POA: Diagnosis not present

## 2023-07-28 LAB — BAYER DCA HB A1C WAIVED: HB A1C (BAYER DCA - WAIVED): 9 % — ABNORMAL HIGH (ref 4.8–5.6)

## 2023-07-28 MED ORDER — TRESIBA FLEXTOUCH 100 UNIT/ML ~~LOC~~ SOPN: 20.0000 [IU] | PEN_INJECTOR | Freq: Every day | SUBCUTANEOUS | 3 refills | Status: AC

## 2023-07-28 NOTE — Progress Notes (Signed)
Unable to perform Diabetic Eye Exam today due to patients pupils being naturally too small.   Patient does see Dr Conley Rolls at [facility] at [facility] 1x per year.

## 2023-07-28 NOTE — Patient Instructions (Addendum)
Go up to 20 units of Tresiba at night See Donna Rogers in next 2-3 weeks with your sugar log. I ordered for someone to reach out to you to test you for sleep apnea. We can start sleep medicine once you are checked out and I know it's safe to start.

## 2023-07-28 NOTE — Progress Notes (Signed)
Subjective: CC:DM PCP: Raliegh Ip, DO [id number] A Bajwa is a 72 y.o. female presenting to clinic today for:  1. Type 2 Diabetes with hypertension, hyperlipidemia:  She is accompanied today by her spouse.  She reports compliance with medications but does occasionally forget to take her [place].  Currently injecting 17 units at bedtime.  She is double dosing of the 1 mg of Ozempic until she can get the 2 mg from the patient assistance program.  Notes that she is tolerating this without difficulty.  On average her blood sugars are running 200s.  This is an improvement for her.  She has had no hypoglycemic episodes and notes lowest blood sugar has been 130s.  She does not use the CGM anymore because it kept falling off and so she just relies on finger prick glucose.  Diabetes Health Maintenance Due  Topic Date Due   FOOT EXAM  11/20/2022   OPHTHALMOLOGY EXAM  07/28/2023 (Originally 04/04/2022)   HEMOGLOBIN A1C  09/05/2023    Last A1c:  Lab Results  Component Value Date   HGBA1C 13.5 (H) 03/05/2023    ROS: Continues to have issues with memory but reports no chest pain, shortness of breath, falls.  She has had some issues with sleep.  Previously trialed on Belsomra but not effective or affordable.  She has been on Ambien, Sonata again not helpful.  Never tried trazodone or doxepin.  Never evaluated for obstructive sleep apnea.  Her husband admits that she does sometimes snore but he has never observed her stop breathing   ROS: Per HPI  Allergies  Allergen Reactions   Ciprofloxacin Hcl Rash   Enbrel [Etanercept] Rash   Farxiga [Dapagliflozin] Other (See Comments)    Reports recurrent yeast infections; currently holding therapy   Metformin And Related Diarrhea   Past Medical History:  Diagnosis Date   Allergy    seasonal allergies   Anemia    hx of   Arthritis    rheumatoid   Depression    on meds   Diabetes (HCC)    Diabetic peripheral neuropathy (HCC)    GERD  (gastroesophageal reflux disease)    on meds   Headache    Hyperlipidemia    on meds   Hyperplastic rectal polyp 10/21/2015   Hypertension    pt. denies   Lymphocytic colitis    Neuropathy    Rheumatoid arthritis (HCC)    Shortness of breath dyspnea    with exertion    Current Outpatient Medications:    Accu-Chek Softclix Lancets lancets, USE TO check blood sugar TWICE DAILY, Disp: , Rfl:    Alpha-Lipoic Acid 600 MG CAPS, TAKE (1) CAPSULE DAILY FOR NERVE PAIN, Disp: 90 capsule, Rfl: 0   Ascorbic Acid (VITAMIN C) 500 MG CHEW, 1 tablet, Disp: , Rfl:    aspirin 81 MG tablet, Take 81 mg by mouth daily., Disp: , Rfl:    atenolol (TENORMIN) 25 MG tablet, Take 1 tablet (25 mg total) by mouth daily., Disp: 90 tablet, Rfl: 3   blood glucose meter kit and supplies, Dispense based on patient and insurance preference. Use up to four times daily as directed. (FOR ICD-10 E10.9, E11.9)., Disp: 1 each, Rfl: 0   buPROPion (WELLBUTRIN XL) 300 MG 24 hr tablet, Take 1 tablet (300 mg total) by mouth daily., Disp: 90 tablet, Rfl: 3   busPIRone (BUSPAR) 10 MG tablet, TAKE (1) TABLET TWICE A DAY., Disp: 60 tablet, Rfl: 1   cholecalciferol (VITAMIN D) 1000  UNITS tablet, Take 1,000 Units by mouth daily., Disp: , Rfl:    citalopram (CELEXA) 40 MG tablet, TAKE ONE TABLET ONCE DAILY, Disp: 90 tablet, Rfl: 3   Continuous Glucose Receiver (FREESTYLE LIBRE READER) DEVI, 1 Units by Does not apply route daily. UAD to test BGs daily. Dx E11.65, Disp: 1 each, Rfl: 1   Continuous Glucose Sensor (FREESTYLE LIBRE 3 SENSOR) MISC, Place 1 sensor on the skin every 14 days. Use to check glucose continuously E11.65, Disp: 6 each, Rfl: 3   Cyanocobalamin (VITAMIN B 12 PO), Take by mouth., Disp: , Rfl:    diclofenac Sodium (VOLTAREN ARTHRITIS PAIN) 1 % GEL, Apply 2 g topically 4 (four) times daily., Disp: 350 g, Rfl: 4   fluconazole (DIFLUCAN) 150 MG tablet, Take 1 tablet now and repeat dose in 3 days, Disp: 2 tablet, Rfl: 0    folic acid (FOLVITE) 1 MG tablet, Take 1 mg by mouth daily., Disp: , Rfl:    glipiZIDE (GLUCOTROL XL) 5 MG 24 hr tablet, Take 1 tablet (5 mg total) by mouth 2 (two) times daily with a meal., Disp: 180 tablet, Rfl: 3   glucose blood test strip, Use as instructed to check sugar twice daily E11.9, Disp: 100 each, Rfl: 12   insulin degludec (TRESIBA FLEXTOUCH) 100 UNIT/ML FlexTouch Pen, Inject 10-20 Units into the skin at bedtime. BRING TO APPT WITH Raynelle Fanning.  She will show how to start., Disp: 15 mL, Rfl: 3   Insulin Pen Needle (NOVOFINE PLUS PEN NEEDLE) 32G X 4 MM MISC, UAD to inject insulin. E11.65, Disp: 100 each, Rfl: 3   Lancet Device MISC, Check sugar 2 times daily E11.9, Disp: 100 each, Rfl: 12   losartan (COZAAR) 100 MG tablet, Take 1 tablet (100 mg total) by mouth daily., Disp: 90 tablet, Rfl: 3   methotrexate (RHEUMATREX) 2.5 MG tablet, 6 tablets Orally once weekly on the same day for 90 days, Disp: , Rfl:    metroNIDAZOLE (FLAGYL) 500 MG tablet, Take 1 tablet (500 mg total) by mouth 2 (two) times daily., Disp: 14 tablet, Rfl: 0   omeprazole (PRILOSEC) 40 MG capsule, Take 1 capsule (40 mg total) by mouth daily., Disp: 90 capsule, Rfl: 3   oxyCODONE-acetaminophen (PERCOCET) 7.5-325 MG tablet, Take 1 tablet by mouth every 8 (eight) hours as needed for severe pain., Disp: 90 tablet, Rfl: 0   pregabalin (LYRICA) 200 MG capsule, TAKE ONE CAPSULE BY MOUTH TWICE DAILY, Disp: 60 capsule, Rfl: 5   Pyridoxine HCl (VITAMIN B-6 PO), Take by mouth., Disp: , Rfl:    Semaglutide, 2 MG/DOSE, 8 MG/3ML SOPN, Inject 2 mg as directed once a week. Via [employer] patient assistance, Disp: , Rfl:    simvastatin (ZOCOR) 40 MG tablet, Take 1 tablet (40 mg total) by mouth daily., Disp: 90 tablet, Rfl: 3   sulfaSALAzine (AZULFIDINE) 500 MG tablet, Take 500 mg by mouth 2 (two) times daily., Disp: , Rfl:    Suvorexant (BELSOMRA) 10 MG TABS, Take 10 mg by mouth at bedtime as needed., Disp: 30 tablet, Rfl: 0   tiZANidine  (ZANAFLEX) 4 MG tablet, Take 0.5-1 tablets (2-4 mg total) by mouth every 8 (eight) hours as needed for muscle spasms., Disp: 90 tablet, Rfl: 0 Social History   Socioeconomic History   Marital status: Significant Other    Spouse name: Not on file   Number of children: 1   Years of education: 11th   Highest education level: 11th grade  Occupational History  Occupation: retired  Tobacco Use   Smoking status: Former    Current packs/day: 1.00    Average packs/day: 1 pack/day for 46.0 years (46.0 ttl pk-yrs)    Types: Cigarettes   Smokeless tobacco: Never  Vaping Use   Vaping status: Some Days  Substance and Sexual Activity   Alcohol use: No    Alcohol/week: 0.0 standard drinks of alcohol   Drug use: No   Sexual activity: Not Currently  Other Topics Concern   Not on file  Social History Narrative   The patient is divorced she has 1 son who is married that she has a grandchild.   She does not use alcohol or drugs she drinks 3-4 caffeinated beverages daily   She is a smoker   She lives with her boyfriend   Social Determinants of Corporate investment banker Strain: Low Risk  (04/20/2023)   Overall Financial Resource Strain (CARDIA)    Difficulty of Paying Living Expenses: Not hard at all  Food Insecurity: No Food Insecurity (04/20/2023)   Hunger Vital Sign    Worried About Running Out of Food in the Last Year: Never true    Ran Out of Food in the Last Year: Never true  Transportation Needs: No Transportation Needs (04/20/2023)   PRAPARE - Administrator, Civil Service (Medical): No    Lack of Transportation (Non-Medical): No  Physical Activity: Inactive (04/20/2023)   Exercise Vital Sign    Days of Exercise per Week: 0 days    Minutes of Exercise per Session: 0 min  Stress: No Stress Concern Present (04/20/2023)   [employer] of Occupational Health - Occupational Stress Questionnaire    Feeling of Stress : Not at all  Social Connections: Moderately Isolated  (04/20/2023)   Social Connection and Isolation Panel [NHANES]    Frequency of Communication with Friends and Family: More than three times a week    Frequency of Social Gatherings with Friends and Family: More than three times a week    Attends Religious Services: Never    Database administrator or Organizations: No    Attends Banker Meetings: Never    Marital Status: Living with partner  Intimate Partner Violence: Not At Risk (04/20/2023)   Humiliation, Afraid, Rape, and Kick questionnaire    Fear of Current or Ex-Partner: No    Emotionally Abused: No    Physically Abused: No    Sexually Abused: No   Family History  Problem Relation Age of Onset   Alzheimer's disease Mother    Lung cancer Mother 43   Diabetes Father    Heart disease Father    Arthritis Sister    Diabetes Sister    Diabetes Brother    Hypertension Brother    Colon cancer Neg Hx    Stomach cancer Neg Hx    Colon polyps Neg Hx    Esophageal cancer Neg Hx    Rectal cancer Neg Hx     Objective: Office vital signs reviewed. BP 130/79   Pulse 73   Temp 98.2 F (36.8 C)   Ht 5\' 1"  (1.549 m)   Wt 208 lb (94.3 kg)   SpO2 92%   BMI 39.30 kg/m   Physical Examination:  General: Awake, alert, obese, No acute distress HEENT: sclera white, MMM Cardio: regular rate and rhythm, S1S2 heard, no murmurs appreciated Pulm: clear to auscultation bilaterally, no wheezes, rhonchi or rales; normal work of breathing on room air Extremities: warm, well  perfused, No edema, cyanosis or clubbing; +2 pulses bilaterally Diabetic Foot Exam - Simple   Simple Foot Form Diabetic Foot exam was performed with the following findings: Yes 07/28/2023  7:10 PM  Visual Inspection See comments: Yes Sensation Testing See comments: Yes Pulse Check Posterior Tibialis and Dorsalis pulse intact bilaterally: Yes Comments Onychomycotic changes to the nails bilaterally.  She has absent monofilament sensation in digits 2, 3, 5 on the  left and digits 1, 2, 4, 5 on the right.      Assessment/ Plan: 72 y.o. female   Insulin-requiring or dependent type II diabetes mellitus (HCC) - Plan: Bayer DCA Hb A1c Waived, insulin degludec (TRESIBA FLEXTOUCH) 100 UNIT/ML FlexTouch Pen  Hypertension associated with diabetes (HCC)  Hyperlipidemia associated with type 2 diabetes mellitus (HCC)  Diabetic polyneuropathy associated with type 2 diabetes mellitus (HCC)  Rheumatoid arthritis involving multiple sites with positive rheumatoid factor (HCC) - Plan: CMP14+EGFR, CBC  Snoring - Plan: Ambulatory referral to Sleep Studies  Poor sleep - Plan: Ambulatory referral to Sleep Studies  Encounter for immunization - Plan: Flu Vaccine Trivalent High Dose (Fluad)  Will Sugar technically uncontrolled A1c of 9.0 this is a drastic improvement from an A1c of almost 14 just a few months ago.  She is tolerating the increased dose of Ozempic without difficulty.  I advised her to increase her insulin to 28 units at bedtime and add like her to bring in her blood sugar log and see Raynelle Fanning in the next 2 to 3 weeks for review.  Anticipate need for further titration.  Sadly she was not able to tolerate CGM.  Not sure if she needs mealtime insulin but we may consider having her prick her fingers after meals to further investigate this.  Her blood pressure was controlled.  No changes are needed.  She will continue current cholesterol medications.  Polyneuropathy chronic and stable.  Her monofilament exam demonstrated absent monofilament sensation in multiple digits of bilateral feet.  She is under the care of of pain management and is being prescribed Lyrica and Percocet for pain.  She is also under the care of rheumatology and receives methotrexate for chronic joint pain.  I have obtained a CBC, CMP and will CC these results to her rheumatologist as FYI  I have referred her to sleep medicine and hopefully we can get a home sleep study for her.  I do  question if perhaps she has underlying OSA.  Her body habitus certainly supports this.  I am considering either low-dose doxepin or trazodone for this patient.  However, would certainly need to keep in mind use of high-dose Celexa and potential QTc prolongation.  Would favor getting an EKG should we decide to start both pre and post initiation of medication.  She was amenable to plan and orders have been placed accordingly  Raliegh Ip, DO [facility] [phone]

## 2023-07-29 LAB — CMP14+EGFR
ALT: 28 [IU]/L (ref 0–32)
AST: 21 [IU]/L (ref 0–40)
Albumin: 4.3 g/dL (ref 3.9–4.9)
Alkaline Phosphatase: 67 [IU]/L (ref 44–121)
BUN/Creatinine Ratio: 12 (ref 12–28)
BUN: 10 mg/dL (ref 8–27)
Bilirubin Total: 0.5 mg/dL (ref 0.0–1.2)
CO2: 28 mmol/L (ref 20–29)
Calcium: 9.4 mg/dL (ref 8.7–10.3)
Chloride: 97 mmol/L (ref 96–106)
Creatinine, Ser: 0.85 mg/dL (ref 0.57–1.00)
Globulin, Total: 2.8 g/dL (ref 1.5–4.5)
Glucose: 176 mg/dL — ABNORMAL HIGH (ref 70–99)
Potassium: 4.1 mmol/L (ref 3.5–5.2)
Sodium: 140 mmol/L (ref 134–144)
Total Protein: 7.1 g/dL (ref 6.0–8.5)
eGFR: 74 mL/min/{1.73_m2} (ref >59)

## 2023-07-29 LAB — CBC
Hematocrit: 42.5 % (ref 34.0–46.6)
Hemoglobin: 14 g/dL (ref 11.1–15.9)
MCH: 30.3 pg (ref 26.6–33.0)
MCHC: 32.9 g/dL (ref 31.5–35.7)
MCV: 92 fL (ref 79–97)
Platelets: 258 10*3/uL (ref 150–450)
RBC: 4.62 x10E6/uL (ref 3.77–5.28)
RDW: 13 % (ref 11.7–15.4)
WBC: 9.7 10*3/uL (ref 3.4–10.8)

## 2023-08-03 DIAGNOSIS — G4710 Hypersomnia, unspecified: Secondary | ICD-10-CM | POA: Diagnosis not present

## 2023-08-04 DIAGNOSIS — G4710 Hypersomnia, unspecified: Secondary | ICD-10-CM | POA: Diagnosis not present

## 2023-08-16 ENCOUNTER — Encounter: Payer: Medicare Other | Admitting: Physical Medicine & Rehabilitation

## 2023-08-19 ENCOUNTER — Ambulatory Visit: Payer: Medicare Other

## 2023-08-20 ENCOUNTER — Encounter: Payer: Self-pay | Admitting: Family Medicine

## 2023-08-24 DIAGNOSIS — G4733 Obstructive sleep apnea (adult) (pediatric): Secondary | ICD-10-CM | POA: Diagnosis not present

## 2023-08-26 ENCOUNTER — Encounter: Payer: Self-pay | Admitting: Physical Medicine & Rehabilitation

## 2023-08-26 ENCOUNTER — Encounter: Payer: Medicare HMO | Attending: Physical Medicine & Rehabilitation | Admitting: Physical Medicine & Rehabilitation

## 2023-08-26 VITALS — BP 131/84 | HR 77 | Wt 212.0 lb

## 2023-08-26 DIAGNOSIS — M25511 Pain in right shoulder: Secondary | ICD-10-CM | POA: Diagnosis not present

## 2023-08-26 DIAGNOSIS — E1142 Type 2 diabetes mellitus with diabetic polyneuropathy: Secondary | ICD-10-CM

## 2023-08-26 DIAGNOSIS — G894 Chronic pain syndrome: Secondary | ICD-10-CM | POA: Diagnosis not present

## 2023-08-26 DIAGNOSIS — M545 Low back pain, unspecified: Secondary | ICD-10-CM

## 2023-08-26 DIAGNOSIS — G8929 Other chronic pain: Secondary | ICD-10-CM | POA: Diagnosis not present

## 2023-08-26 DIAGNOSIS — Z79891 Long term (current) use of opiate analgesic: Secondary | ICD-10-CM | POA: Diagnosis not present

## 2023-08-26 DIAGNOSIS — Z5181 Encounter for therapeutic drug level monitoring: Secondary | ICD-10-CM | POA: Diagnosis not present

## 2023-08-26 DIAGNOSIS — Z794 Long term (current) use of insulin: Secondary | ICD-10-CM

## 2023-08-26 NOTE — Progress Notes (Signed)
 Subjective:    Patient ID: Donna Rogers, female    DOB: 1951/07/25, 73 y.o.   MRN: [id number]  HPI   HPI on 06/05/22 Donna Rogers is a 73 yo female with PMH of rheumatoid arthritis, chronic low back pain, diabetes mellitus type 2 with polyneuropathy, GERD, carpal tunnel right wrist, depression who is here for chronic pain.  Patient reports that she has been having burning and freezing pain in Donna feet that has been worsening over the past few years.  She says she feels like Donna feet are on ice.  Pain is worse on the dorsal and plantar aspects of Donna feet however she also has this pain over Donna ankles.  She was seen in the past by Dr. Bonner for lumbar spine arthritis and reports that she had epidural steroid injections x2.  She reports the first injection helped a little bit but the second injection did not provide any relief.  She continues to have sharp and aching pain in Donna lower back and sometimes pain shoots into Donna thighs and legs.  Back pain is worsened with walking or activities.  Additionally she has severe pain in Donna right shoulder.  She reports shoulder injection did not help the pain.  Shoulder pain is worsened with movement.  She additionally has pain in both of Donna hips hands, knees.  Pain makes it difficult to sleep.  She is followed by rheumatology by Dr. Lynwood Ramsay and Rosaline Salt and is currently treated with methotrexate.  She says that she was was previously on Enbrel.   Prior treatments Tylenol  and ibuprofen in past helped slightly Diclofenac  oral did not help Blu Emu cream- helps a little Gabapentin  1200mg  BID to TID with mild to moderate improvement She has not tried Lyrica  Hydrocodone  helped in the past, it stopped working  She has not tried oxycodone  Tizanidine  did not help   Interval History Donna Rogers is here for follow-up due to Donna chronic pain.  She reports that Donna polyneuropathy is much improved since starting Lyrica .  She feels that this  works much better than the gabapentin .  She says she would like to try Qutenza however cost is prohibitive at this time.  She continues to have lower back pain and shoulder pain bilaterally that limits Donna activities.  The lower back pain did not improve significantly with the change to Lyrica .  She has not had any side effects with this medication.  She reports she previously tried tramadol  and it initially helped mildly but later did not provide significant benefit.  She will occasionally get a tingling sensation in the fourth and fifth digits on both hands but this is not present currently.  She feels like she has poor balance overall however has not had any recent falls.  She does not use a cane for ambulation at this time but she has been home.     Interval History 10/01/22 Donna Rogers is here for follow-up regarding Donna chronic pain.  She reports that oxycodone  is helping to control Donna lower back pain.  She uses this medication sparingly only when the pain is very severe.  The medication allows Donna to do more active activities and tasks at Donna house.  She continues to take Lyrica  for burning sensation in Donna feet.  She does feel like the medication is not helping as much as it did when we spoke at Donna last visit.  Lyrica  does not currently give Donna the drunk feeling she was getting  when she is taking gabapentin .  She denies any history of CKD or known history of kidney dysfunction.  She continues to have right shoulder pain.  She says that she would like to repeat the right shoulder injection.  She previously had this completed by Donna orthopedic doctor.  She asked if we can completed at Donna next visit if possible.     Interval History 4/1 Donna Rogers is here regarding Donna chronic pain and for a right shoulder injection.  She reports that oxycodone  has been providing benefit to Donna pain however she feels like the 5 mg dose is not strong enough.  Previously when she tried 2 tablets of the oxycodone  5  mg this provided better relief for Donna pain.  She continues to have back pain that is sometimes shooting down Donna right leg.  She continues to have pain in Donna right shoulder that is limiting.  She does not have any side effects with these are working well for Donna and not.     Interval history 02/05/2023 Donna Rogers is here for follow-up regarding Donna chronic low back pain, shoulder pain and polyneuropathy.  She reports Donna shoulder is doing much better since we did the shoulder injection last visit.  Oxycodone  7.5 3 times daily is helping Donna pain stay very controlled.  She continues to have a lot of lumbar back pain however it is more tolerable with this medication.  For the past few months she has been using Lyrica  150 mg twice daily noted on PDMP review.  In February and March she was getting 200 mg of Lyrica  twice daily however I think she may have reverted back to an older prescription and started taking up to 150 mg tabs from the pharmacy.  She is unsure what kind of difference is made to Donna pain.  She does continue to have pain in Donna feet that has been attributed to polyneuropathy.     Interval history 03/12/2023 Donna Rogers is here for follow-up regarding Donna chronic lower back pain, right shoulder pain and polyneuropathy.  Patient feels that Donna pain is doing better overall since Donna last visit.  She does ask when she can do Donna repeat shoulder injection as the pain is gradually coming back.  She does not feel like she needs an injection today because pain is still overall controlled.  She reports current medications are working well for Donna and not causing any significant side effects.  Patient reports she is using Percocet sparingly and this appears consistent with Donna pill count.  Interval history 05/13/2023 Donna Rogers is here for follow-up regarding Donna chronic lower back pain, right shoulder pain and polyneuropathy.  Patient reports that she continues to have pain in these areas however it is  doing better than it was previously.  She has not needed to use this many tabs of oxycodone  and still has 90 tabs left today.  She reports Donna shoulder pain is under control as well.  She does have numbness and pain in Donna feet, she reports a cold sensation in Donna feet.  When she does need oxycodone  she reports it is helping Donna pain.   Interval history 08/25/22 Donna Rogers is tomorrow.  She was recently started on CPAP for OSA. She continues to have pain in Donna lower back. Oxycodone  helps Donna pain, she uses sparingly only when pain is severe.  Pain is also coming back in R shoulder.   She also has pain from polyneuropathy in Donna b/l feet.  Lyrica  is helping but not keeping it under control.    Pain Inventory Average Pain 5 Pain Right Now 6 My pain is sharp, stabbing, and aching  In the last 24 hours, has pain interfered with the following? General activity 2 Relation with others 6 Enjoyment of life 1 What TIME of day is your pain at its worst? morning , daytime, evening, and night Sleep (in general) Fair  Pain is worse with: walking, inactivity, standing, and some activites Pain improves with: rest and medication Relief from Meds: 7  Family History  Problem Relation Age of Onset   Alzheimer's disease Mother    Lung cancer Mother 68   Diabetes Father    Heart disease Father    Arthritis Sister    Diabetes Sister    Diabetes Brother    Hypertension Brother    Colon cancer Neg Hx    Stomach cancer Neg Hx    Colon polyps Neg Hx    Esophageal cancer Neg Hx    Rectal cancer Neg Hx    Social History   Socioeconomic History   Marital status: Significant Other    Spouse name: Not on file   Number of children: 1   Years of education: 11th   Highest education level: 11th grade  Occupational History   Occupation: retired  Tobacco Use   Smoking status: Former    Current packs/day: 1.00    Average packs/day: 1 pack/day for 46.0 years (46.0 ttl pk-yrs)    Types: Cigarettes    Smokeless tobacco: Never  Vaping Use   Vaping status: Some Days  Substance and Sexual Activity   Alcohol use: No    Alcohol/week: 0.0 standard drinks of alcohol   Drug use: No   Sexual activity: Not Currently  Other Topics Concern   Not on file  Social History Narrative   The patient is divorced she has 1 son who is married that she has a grandchild.   She does not use alcohol or drugs she drinks 3-4 caffeinated beverages daily   She is a smoker   She lives with Donna boyfriend   Social Drivers of Corporate Investment Banker Strain: Low Risk  (04/20/2023)   Overall Financial Resource Strain (CARDIA)    Difficulty of Paying Living Expenses: Not hard at all  Food Insecurity: No Food Insecurity (04/20/2023)   Hunger Vital Sign    Worried About Running Out of Food in the Last Year: Never true    Ran Out of Food in the Last Year: Never true  Transportation Needs: No Transportation Needs (04/20/2023)   PRAPARE - Administrator, Civil Service (Medical): No    Lack of Transportation (Non-Medical): No  Physical Activity: Inactive (04/20/2023)   Exercise Vital Sign    Days of Exercise per Week: 0 days    Minutes of Exercise per Session: 0 min  Stress: No Stress Concern Present (04/20/2023)   [employer] of Occupational Health - Occupational Stress Questionnaire    Feeling of Stress : Not at all  Social Connections: Moderately Isolated (04/20/2023)   Social Connection and Isolation Panel [NHANES]    Frequency of Communication with Friends and Family: More than three times a week    Frequency of Social Gatherings with Friends and Family: More than three times a week    Attends Religious Services: Never    Database Administrator or Organizations: No    Attends Banker Meetings: Never    Marital  Status: Living with partner   Past Surgical History:  Procedure Laterality Date   BREAST LUMPECTOMY Right 2018   BREAST LUMPECTOMY WITH RADIOACTIVE SEED LOCALIZATION Left  03/03/2016   Procedure: BREAST LUMPECTOMY WITH RADIOACTIVE SEED LOCALIZATION;  Surgeon: Vicenta Poli, MD;  Location: [facility] OR;  Service: General;  Laterality: Left;   COLONOSCOPY  08/2002   RMR: internal hemorrhoids   COLONOSCOPY N/A 10/21/2015   Procedure: COLONOSCOPY;  Surgeon: Lamar CHRISTELLA Hollingshead, MD;  Location: [facility];  Service: Endoscopy;  Laterality: N/A;  250 - moved to 2:35 - office to notify pt   CYST REMOVAL TRUNK     ESOPHAGOGASTRODUODENOSCOPY  08/2002   RMR: nonerosive reflux esophagitis, hiatal hernia   TONSILLECTOMY     WISDOM TOOTH EXTRACTION     Past Surgical History:  Procedure Laterality Date   BREAST LUMPECTOMY Right 2018   BREAST LUMPECTOMY WITH RADIOACTIVE SEED LOCALIZATION Left 03/03/2016   Procedure: BREAST LUMPECTOMY WITH RADIOACTIVE SEED LOCALIZATION;  Surgeon: Vicenta Poli, MD;  Location: [facility] OR;  Service: General;  Laterality: Left;   COLONOSCOPY  08/2002   RMR: internal hemorrhoids   COLONOSCOPY N/A 10/21/2015   Procedure: COLONOSCOPY;  Surgeon: Lamar CHRISTELLA Hollingshead, MD;  Location: [facility];  Service: Endoscopy;  Laterality: N/A;  250 - moved to 2:35 - office to notify pt   CYST REMOVAL TRUNK     ESOPHAGOGASTRODUODENOSCOPY  08/2002   RMR: nonerosive reflux esophagitis, hiatal hernia   TONSILLECTOMY     WISDOM TOOTH EXTRACTION     Past Medical History:  Diagnosis Date   Allergy    seasonal allergies   Anemia    hx of   Arthritis    rheumatoid   Depression    on meds   Diabetes (HCC)    Diabetic peripheral neuropathy (HCC)    GERD (gastroesophageal reflux disease)    on meds   Headache    Hyperlipidemia    on meds   Hyperplastic rectal polyp 10/21/2015   Hypertension    pt. denies   Lymphocytic colitis    Neuropathy    Rheumatoid arthritis (HCC)    Shortness of breath dyspnea    with exertion   There were no vitals taken for this visit.  Opioid Risk Score:   Fall Risk Score:  `1  Depression screen PHQ 2/9     08/26/2023    1:00 PM  07/28/2023    2:33 PM 07/28/2023    2:32 PM 05/13/2023    1:54 PM 04/20/2023    2:38 PM 03/12/2023    2:32 PM 03/05/2023   10:32 AM  Depression screen PHQ 2/9  Decreased Interest 0 1 0 0 0 0 1  Down, Depressed, Hopeless 0 2 0 0 0 0 1  PHQ - 2 Score 0 3 0 0 0 0 2  Altered sleeping  3 0  0  3  Tired, decreased energy  3 0  0  3  Change in appetite  0 0  0  0  Feeling bad or failure about yourself   3 0  0  1  Trouble concentrating  1 0  0  0  Moving slowly or fidgety/restless  3 0  0  0  Suicidal thoughts  0 0  0  0  PHQ-9 Score  16 0  0  9  Difficult doing work/chores  Very difficult Not difficult at all  Not difficult at all  Somewhat difficult      Review of  Systems  Musculoskeletal:  Positive for back pain.  All other systems reviewed and are negative.      Objective:   Physical Exam   Physical Exam Gen: no distress, normal appearing HEENT: oral mucosa pink and moist, NCAT Chest: normal effort, normal rate of breathing Abd: soft, non-distended Ext: no edema Psych: pleasant, appropriate Skin: intact Neuro: CN 2-12 grossly intact, follows commands No focal motor deficits noted SLR positive on the left  Decreased sensation to LT in b/l LE below the knee    Musculoskeletal: R shoulder pain with abduction  Pain with internal rotation of right shoulder Mild paraspinal tenderness  more on Right side L spine     Lumbar xray 08/26/17 FINDINGS: Five lumbar type vertebral bodies are well visualized. Vertebral body height is well maintained. Multilevel disc space narrowing is noted throughout the lumbar spine progressed in the interval from the prior exam. Osteophytic changes are noted as well. No anterolisthesis is seen. A somewhat rounded calcification is noted in the right upper quadrant which may be related to cholelithiasis. Clinical correlation is recommended.   IMPRESSION: Multilevel degenerative changes progressed when compared with the prior exam of 20/8.    Rounded calcification in the right upper quadrant may represent cholelithiasis.       MRI shoulder 08/22/21 IMPRESSION: 1. Full-thickness, full width tears of the supraspinatus and infraspinatus tendons with retraction to the glenoid. 2. Full-thickness subscapularis tear involving the mid to superior fibers with tendon gap up to 1.0 cm, and interstitial extension proximally. 3. Grade 2 muscle atrophy of the supraspinatus, infraspinatus, and subscapularis muscles. 4. Moderate glenohumeral and acromioclavicular joint osteoarthritis. 5. Likely torn and retracted long head biceps tendon. 6. Degenerative posterosuperior labral tearing.           Assessment & Plan:     Chronic lower back pain, lumbar spondylosis -UDS and pain agreement completed prior visit -Continue oxycodone  7.5mg  Q8h prn #90-advised to call several days before due for refill -Consider repeat lumbar spine MRI if pain worsens further-discussed this again today although pain appears to be controlled -Continue UDS and pill counts.  Continue PDMP monitoring.  Pain contract completed prior visit.  -Continue use of TENS -Repeat UDS, Pill count consistent  -Home exercises for back pain provided -Order MRI L spine for further evaluation     Right shoulder pain with biceps SS, IS and Subscapularis tears, GH and AC arthritis  -Continue medications as above -Voltaren  gel as needed -Shoulder injection completed prior visit.   -If unable to complete qutenza next visit- consider shoulder injection next visit  DM with polyneuropathy --Discussed Qutenza as an option for neuropathic pain control. Discussed that this is a capsaicin patch, stronger than capsaicin cream. Discussed that it is currently approved for diabetic peripheral neuropathy and post-herpetic neuralgia, but that it has also shown benefit in treating other forms of neuropathy. Provided patient with link to site to learn more about the patch:  https://www.clark.biz/. Discussed that the patch would be placed in office and benefits usually last 3 months. Discussed that unintended exposure to capsaicin can cause severe irritation of eyes, mucous membranes, respiratory tract, and skin, but that Qutenza is a local treatment and does not have the systemic side effects of other nerve medications. Discussed that there may be pain, itching, erythema, and decreased sensory function associated with the application of Qutenza. Side effects usually subside within 1 week. A cold pack of analgesic medications can help with these side effects. Blood pressure can also be increased  due to pain associated with administration of the patch. -Cont Lyrica  to 200mg  BID -Schedule qutenza for next visit, check insurance coverage  Intermittent numbness in Donna fourth and fifth digit, Possibly some ulnar nerve irritation,  Continue to monitor.   Rheumatoid arthritis and polyarthralgia history -Continue f/u with rheumatology

## 2023-08-31 LAB — TOXASSURE SELECT,+ANTIDEPR,UR

## 2023-09-20 DIAGNOSIS — G4733 Obstructive sleep apnea (adult) (pediatric): Secondary | ICD-10-CM | POA: Diagnosis not present

## 2023-09-24 DIAGNOSIS — G4733 Obstructive sleep apnea (adult) (pediatric): Secondary | ICD-10-CM | POA: Diagnosis not present

## 2023-09-27 ENCOUNTER — Encounter: Payer: Medicare HMO | Attending: Physical Medicine & Rehabilitation | Admitting: Physical Medicine & Rehabilitation

## 2023-09-27 ENCOUNTER — Encounter: Payer: Self-pay | Admitting: Physical Medicine & Rehabilitation

## 2023-09-27 VITALS — BP 130/83 | HR 76 | Ht 61.0 in | Wt 212.0 lb

## 2023-09-27 DIAGNOSIS — E1142 Type 2 diabetes mellitus with diabetic polyneuropathy: Secondary | ICD-10-CM | POA: Insufficient documentation

## 2023-09-27 DIAGNOSIS — G8929 Other chronic pain: Secondary | ICD-10-CM | POA: Insufficient documentation

## 2023-09-27 DIAGNOSIS — Z794 Long term (current) use of insulin: Secondary | ICD-10-CM

## 2023-09-27 DIAGNOSIS — M25511 Pain in right shoulder: Secondary | ICD-10-CM | POA: Diagnosis not present

## 2023-09-27 DIAGNOSIS — Z79891 Long term (current) use of opiate analgesic: Secondary | ICD-10-CM | POA: Diagnosis not present

## 2023-09-27 DIAGNOSIS — G894 Chronic pain syndrome: Secondary | ICD-10-CM | POA: Diagnosis not present

## 2023-09-27 DIAGNOSIS — M545 Low back pain, unspecified: Secondary | ICD-10-CM | POA: Insufficient documentation

## 2023-09-27 MED ORDER — OXYCODONE-ACETAMINOPHEN 7.5-325 MG PO TABS: 1.0000 | ORAL_TABLET | Freq: Three times a day (TID) | ORAL | 0 refills | Status: DC | PRN

## 2023-09-27 NOTE — Progress Notes (Signed)
 Subjective:    Patient ID: [name], female    DOB: [birth year], 73 y.o.   MRN: [id number]  HPI   HPI on 06/05/22 Glorine Gerardo is a 73 yo female with PMH of rheumatoid arthritis, chronic low back pain, diabetes mellitus type 2 with polyneuropathy, GERD, carpal tunnel right wrist, depression who is here for chronic pain.  Patient reports that she has been having burning and freezing pain in her feet that has been worsening over the past few years.  She says she feels like her feet are on ice.  Pain is worse on the dorsal and plantar aspects of her feet however she also has this pain over her ankles.  She was seen in the past by Dr. Rexanne Catalina for lumbar spine arthritis and reports that she had epidural steroid injections x2.  She reports the first injection helped a little bit but the second injection did not provide any relief.  She continues to have sharp and aching pain in her lower back and sometimes pain shoots into her thighs and legs.  Back pain is worsened with walking or activities.  Additionally she has severe pain in her right shoulder.  She reports shoulder injection did not help the pain.  Shoulder pain is worsened with movement.  She additionally has pain in both of her hips hands, knees.  Pain makes it difficult to sleep.  She is followed by rheumatology by Dr. Kirtland Perfect and Vinita Greenspan and is currently treated with methotrexate.  She says that she was was previously on Enbrel.   Prior treatments Tylenol  and ibuprofen in past helped slightly Diclofenac  oral did not help Blu Emu cream- helps a little Gabapentin  1200mg  BID to TID with mild to moderate improvement She has not tried Lyrica  Hydrocodone  helped in the past, it stopped working  She has not tried oxycodone  Tizanidine  did not help   Interval History Anyah Moralez is here for follow-up due to her chronic pain.  She reports that her polyneuropathy is much improved since starting Lyrica .  She feels that this  works much better than the gabapentin .  She says she would like to try Qutenza however cost is prohibitive at this time.  She continues to have lower back pain and shoulder pain bilaterally that limits her activities.  The lower back pain did not improve significantly with the change to Lyrica .  She has not had any side effects with this medication.  She reports she previously tried tramadol  and it initially helped mildly but later did not provide significant benefit.  She will occasionally get a tingling sensation in the fourth and fifth digits on both hands but this is not present currently.  She feels like she has poor balance overall however has not had any recent falls.  She does not use a cane for ambulation at this time but she has been home.     Interval History 10/01/22 Daylia Hartin is here for follow-up regarding her chronic pain.  She reports that oxycodone  is helping to control her lower back pain.  She uses this medication sparingly only when the pain is very severe.  The medication allows her to do more active activities and tasks at her house.  She continues to take Lyrica  for burning sensation in her feet.  She does feel like the medication is not helping as much as it did when we spoke at her last visit.  Lyrica  does not currently give her the drunk feeling she was getting  when she is taking gabapentin .  She denies any history of CKD or known history of kidney dysfunction.  She continues to have right shoulder pain.  She says that she would like to repeat the right shoulder injection.  She previously had this completed by her orthopedic doctor.  She asked if we can completed at her next visit if possible.     Interval History 4/1 Ms. Rogness is here regarding her chronic pain and for a right shoulder injection.  She reports that oxycodone  has been providing benefit to her pain however she feels like the 5 mg dose is not strong enough.  Previously when she tried 2 tablets of the oxycodone  5  mg this provided better relief for her pain.  She continues to have back pain that is sometimes shooting down her right leg.  She continues to have pain in her right shoulder that is limiting.  She does not have any side effects with these are working well for her and not.     Interval history 02/05/2023 Ms. Ormsby is here for follow-up regarding her chronic low back pain, shoulder pain and polyneuropathy.  She reports her shoulder is doing much better since we did the shoulder injection last visit.  Oxycodone  7.5 3 times daily is helping her pain stay very controlled.  She continues to have a lot of lumbar back pain however it is more tolerable with this medication.  For the past few months she has been using Lyrica  150 mg twice daily noted on PDMP review.  In February and March she was getting 200 mg of Lyrica  twice daily however I think she may have reverted back to an older prescription and started taking up to 150 mg tabs from the pharmacy.  She is unsure what kind of difference is made to her pain.  She does continue to have pain in her feet that has been attributed to polyneuropathy.     Interval history 03/12/2023 Ms. Wherry is here for follow-up regarding her chronic lower back pain, right shoulder pain and polyneuropathy.  Patient feels that her pain is doing better overall since her last visit.  She does ask when she can do her repeat shoulder injection as the pain is gradually coming back.  She does not feel like she needs an injection today because pain is still overall controlled.  She reports current medications are working well for her and not causing any significant side effects.  Patient reports she is using Percocet sparingly and this appears consistent with her pill count.  Interval history 05/13/2023 Ms. Mcelhone is here for follow-up regarding her chronic lower back pain, right shoulder pain and polyneuropathy.  Patient reports that she continues to have pain in these areas however it is  doing better than it was previously.  She has not needed to use this many tabs of oxycodone  and still has 90 tabs left today.  She reports her shoulder pain is under control as well.  She does have numbness and pain in her feet, she reports a cold sensation in her feet.  When she does need oxycodone  she reports it is helping her pain.   Interval history 08/25/22 Her Cl[place] Dar is tomorrow.  She was recently started on CPAP for OSA. She continues to have pain in her lower back. Oxycodone  helps her pain, she uses sparingly only when pain is severe.  Pain is also coming back in R shoulder.   She also has pain from polyneuropathy in her b/l feet.  Lyrica  is helping but not keeping it under control.    Interval history 09/26/22 Pt continues to have severe pain in her feet. This visit was scheduled as f/u visit instead of Qutenza. She is still interested in Qutenza treatment. She also has pain in her lower back. Oxycodone  continues to help her pain, still using it only when pain is very severe. She continues to have R shoulder pain.   Pain Inventory Average Pain 5 Pain Right Now 6 My pain is sharp, stabbing, and aching  In the last 24 hours, has pain interfered with the following? General activity 2 Relation with others 6 Enjoyment of life 1 What TIME of day is your pain at its worst? morning , daytime, evening, and night Sleep (in general) Fair  Pain is worse with: walking, inactivity, standing, and some activites Pain improves with: rest and medication Relief from Meds: 7  Family History  Problem Relation Age of Onset   Alzheimer's disease Mother    Lung cancer Mother 53   Diabetes Father    Heart disease Father    Arthritis Sister    Diabetes Sister    Diabetes Brother    Hypertension Brother    Colon cancer Neg Hx    Stomach cancer Neg Hx    Colon polyps Neg Hx    Esophageal cancer Neg Hx    Rectal cancer Neg Hx    Social History   Socioeconomic History   Marital status:  Significant Other    Spouse name: Not on file   Number of children: 1   Years of education: 11th   Highest education level: 11th grade  Occupational History   Occupation: retired  Tobacco Use   Smoking status: Former    Current packs/day: 1.00    Average packs/day: 1 pack/day for 46.0 years (46.0 ttl pk-yrs)    Types: Cigarettes   Smokeless tobacco: Never  Vaping Use   Vaping status: Some Days  Substance and Sexual Activity   Alcohol use: No    Alcohol/week: 0.0 standard drinks of alcohol   Drug use: No   Sexual activity: Not Currently  Other Topics Concern   Not on file  Social History Narrative   The patient is divorced she has 1 son who is married that she has a grandchild.   She does not use alcohol or drugs she drinks 3-4 caffeinated beverages daily   She is a smoker   She lives with her boyfriend   Social Drivers of Corporate investment banker Strain: Low Risk  (04/20/2023)   Overall Financial Resource Strain (CARDIA)    Difficulty of Paying Living Expenses: Not hard at all  Food Insecurity: No Food Insecurity (04/20/2023)   Hunger Vital Sign    Worried About Running Out of Food in the Last Year: Never true    Ran Out of Food in the Last Year: Never true  Transportation Needs: No Transportation Needs (04/20/2023)   PRAPARE - Administrator, Civil Service (Medical): No    Lack of Transportation (Non-Medical): No  Physical Activity: Inactive (04/20/2023)   Exercise Vital Sign    Days of Exercise per Week: 0 days    Minutes of Exercise per Session: 0 min  Stress: No Stress Concern Present (04/20/2023)   [employer] of Occupational Health - Occupational Stress Questionnaire    Feeling of Stress : Not at all  Social Connections: Moderately Isolated (04/20/2023)   Social Connection and Isolation Panel [NHANES]  Frequency of Communication with Friends and Family: More than three times a week    Frequency of Social Gatherings with Friends and Family: More than  three times a week    Attends Religious Services: Never    Database administrator or Organizations: No    Attends Banker Meetings: Never    Marital Status: Living with partner   Past Surgical History:  Procedure Laterality Date   BREAST LUMPECTOMY Right 2018   BREAST LUMPECTOMY WITH RADIOACTIVE SEED LOCALIZATION Left 03/03/2016   Procedure: BREAST LUMPECTOMY WITH RADIOACTIVE SEED LOCALIZATION;  Surgeon: Oza Blumenthal, MD;  Location: [facility] OR;  Service: General;  Laterality: Left;   COLONOSCOPY  08/2002   RMR: internal hemorrhoids   COLONOSCOPY N/A 10/21/2015   Procedure: COLONOSCOPY;  Surgeon: Suzette Espy, MD;  Location: [facility];  Service: Endoscopy;  Laterality: N/A;  250 - moved to 2:35 - office to notify pt   CYST REMOVAL TRUNK     ESOPHAGOGASTRODUODENOSCOPY  08/2002   RMR: nonerosive reflux esophagitis, hiatal hernia   TONSILLECTOMY     WISDOM TOOTH EXTRACTION     Past Surgical History:  Procedure Laterality Date   BREAST LUMPECTOMY Right 2018   BREAST LUMPECTOMY WITH RADIOACTIVE SEED LOCALIZATION Left 03/03/2016   Procedure: BREAST LUMPECTOMY WITH RADIOACTIVE SEED LOCALIZATION;  Surgeon: Oza Blumenthal, MD;  Location: [facility] OR;  Service: General;  Laterality: Left;   COLONOSCOPY  08/2002   RMR: internal hemorrhoids   COLONOSCOPY N/A 10/21/2015   Procedure: COLONOSCOPY;  Surgeon: Suzette Espy, MD;  Location: [facility];  Service: Endoscopy;  Laterality: N/A;  250 - moved to 2:35 - office to notify pt   CYST REMOVAL TRUNK     ESOPHAGOGASTRODUODENOSCOPY  08/2002   RMR: nonerosive reflux esophagitis, hiatal hernia   TONSILLECTOMY     WISDOM TOOTH EXTRACTION     Past Medical History:  Diagnosis Date   Allergy    seasonal allergies   Anemia    hx of   Arthritis    rheumatoid   Depression    on meds   Diabetes (HCC)    Diabetic peripheral neuropathy (HCC)    GERD (gastroesophageal reflux disease)    on meds   Headache    Hyperlipidemia    on meds    Hyperplastic rectal polyp 10/21/2015   Hypertension    pt. denies   Lymphocytic colitis    Neuropathy    Rheumatoid arthritis (HCC)    Shortness of breath dyspnea    with exertion   BP 130/83   Pulse 76   Ht 5\' 1"  (1.549 m)   Wt 212 lb (96.2 kg)   SpO2 94%   BMI 40.06 kg/m   Opioid Risk Score:   Fall Risk Score:  `1  Depression screen PHQ 2/9     08/26/2023    1:00 PM 07/28/2023    2:33 PM 07/28/2023    2:32 PM 05/13/2023    1:54 PM 04/20/2023    2:38 PM 03/12/2023    2:32 PM 03/05/2023   10:32 AM  Depression screen PHQ 2/9  Decreased Interest 0 1 0 0 0 0 1  Down, Depressed, Hopeless 0 2 0 0 0 0 1  PHQ - 2 Score 0 3 0 0 0 0 2  Altered sleeping  3 0  0  3  Tired, decreased energy  3 0  0  3  Change in appetite  0 0  0  0  Feeling bad or failure about yourself   3 0  0  1  Trouble concentrating  1 0  0  0  Moving slowly or fidgety/restless  3 0  0  0  Suicidal thoughts  0 0  0  0  PHQ-9 Score  16 0  0  9  Difficult doing work/chores  Very difficult Not difficult at all  Not difficult at all  Somewhat difficult      Review of Systems  Musculoskeletal:  Positive for back pain.  All other systems reviewed and are negative.      Objective:   Physical Exam   Physical Exam Gen: no distress, normal appearing HEENT: oral mucosa pink and moist, NCAT Chest: normal effort, normal rate of breathing Abd: soft, non-distended Ext: no edema Psych: pleasant, appropriate Skin: intact Neuro: CN 2-12 grossly intact, follows commands No focal motor deficits noted Decreased sensation to LT in b/l LE below the knee  Musculoskeletal: R shoulder pain with abduction and internal rotation  Mild paraspinal TTP L spine     Lumbar xray 08/26/17 FINDINGS: Five lumbar type vertebral bodies are well visualized. Vertebral body height is well maintained. Multilevel disc space narrowing is noted throughout the lumbar spine progressed in the interval from the prior exam. Osteophytic  changes are noted as well. No anterolisthesis is seen. A somewhat rounded calcification is noted in the right upper quadrant which may be related to cholelithiasis. Clinical correlation is recommended.   IMPRESSION: Multilevel degenerative changes progressed when compared with the prior exam of 20/8.   Rounded calcification in the right upper quadrant may represent cholelithiasis.       MRI shoulder 08/22/21 IMPRESSION: 1. Full-thickness, full width tears of the supraspinatus and infraspinatus tendons with retraction to the glenoid. 2. Full-thickness subscapularis tear involving the mid to superior fibers with tendon gap up to 1.0 cm, and interstitial extension proximally. 3. Grade 2 muscle atrophy of the supraspinatus, infraspinatus, and subscapularis muscles. 4. Moderate glenohumeral and acromioclavicular joint osteoarthritis. 5. Likely torn and retracted long head biceps tendon. 6. Degenerative posterosuperior labral tearing.           Assessment & Plan:     Chronic lower back pain, lumbar spondylosis -UDS and pain agreement completed prior visit -Continue oxycodone  7.5mg  Q8h prn #90-refilled  -Continue UDS and pill counts.  Continue PDMP monitoring.  Pain contract completed prior visit.  -Continue use of TENS -Repeat UDS, Pill count consistent  -Home exercises for back pain provided prior visit -Pending MRI L spine for further evaluation, may help with interventional options - provided number for her to call to schedule     Right shoulder pain with biceps SS, IS and Subscapularis tears, GH and AC arthritis  -Continue medications as above -Voltaren  gel as needed -Shoulder injection completed prior visit.   -If unable to complete qutenza next visit- consider shoulder injection next visit  DM with polyneuropathy --Discussed Qutenza as an option for neuropathic pain control. Discussed that this is a capsaicin patch, stronger than capsaicin cream. Discussed that it is  currently approved for diabetic peripheral neuropathy and post-herpetic neuralgia, but that it has also shown benefit in treating other forms of neuropathy. Provided patient with link to site to learn more about the patch: https://www.clark.biz/. Discussed that the patch would be placed in office and benefits usually last 3 months. Discussed that unintended exposure to capsaicin can cause severe irritation of eyes, mucous membranes, respiratory tract, and skin, but that Qutenza is a local  treatment and does not have the systemic side effects of other nerve medications. Discussed that there may be pain, itching, erythema, and decreased sensory function associated with the application of Qutenza. Side effects usually subside within 1 week. A cold pack of analgesic medications can help with these side effects. Blood pressure can also be increased due to pain associated with administration of the patch. -Cont Lyrica  to 200mg  BID -Schedule qutenza for next visit, check insurance coverage- will reschedule procedure  Intermittent numbness in her fourth and fifth digit, Possibly some ulnar nerve irritation,  Continue to monitor.   Rheumatoid arthritis and polyarthralgia history -Continue f/u with rheumatology

## 2023-09-29 ENCOUNTER — Telehealth: Payer: Self-pay

## 2023-09-29 NOTE — Telephone Encounter (Signed)
Patches sent to [facility], awaiting reply

## 2023-10-04 ENCOUNTER — Encounter: Payer: Medicare HMO | Admitting: Physical Medicine & Rehabilitation

## 2023-10-08 ENCOUNTER — Other Ambulatory Visit: Payer: Self-pay | Admitting: Family Medicine

## 2023-10-08 DIAGNOSIS — E1142 Type 2 diabetes mellitus with diabetic polyneuropathy: Secondary | ICD-10-CM

## 2023-10-12 ENCOUNTER — Other Ambulatory Visit: Payer: Self-pay | Admitting: Family Medicine

## 2023-10-12 DIAGNOSIS — E1169 Type 2 diabetes mellitus with other specified complication: Secondary | ICD-10-CM

## 2023-10-18 DIAGNOSIS — G4733 Obstructive sleep apnea (adult) (pediatric): Secondary | ICD-10-CM | POA: Diagnosis not present

## 2023-10-22 ENCOUNTER — Ambulatory Visit (HOSPITAL_BASED_OUTPATIENT_CLINIC_OR_DEPARTMENT_OTHER): Payer: Medicare HMO

## 2023-10-22 ENCOUNTER — Other Ambulatory Visit: Payer: Self-pay | Admitting: Physical Medicine & Rehabilitation

## 2023-10-22 DIAGNOSIS — G4733 Obstructive sleep apnea (adult) (pediatric): Secondary | ICD-10-CM | POA: Diagnosis not present

## 2023-10-25 ENCOUNTER — Encounter: Payer: Self-pay | Admitting: Physical Medicine & Rehabilitation

## 2023-10-25 ENCOUNTER — Telehealth: Payer: Self-pay | Admitting: Physical Medicine & Rehabilitation

## 2023-10-25 DIAGNOSIS — G4733 Obstructive sleep apnea (adult) (pediatric): Secondary | ICD-10-CM | POA: Diagnosis not present

## 2023-10-25 NOTE — Telephone Encounter (Signed)
 Alecia Lemming from prior [place] called and stated addtl info is needed for qutenza to be authorized. He stated you can give him a call back or fax back the info he has sent over. He left a vm on nurse  line at 11:29 am today

## 2023-10-26 ENCOUNTER — Other Ambulatory Visit: Payer: Medicare Other

## 2023-10-26 ENCOUNTER — Ambulatory Visit: Payer: Medicare Other | Admitting: Family Medicine

## 2023-10-26 DIAGNOSIS — M0579 Rheumatoid arthritis with rheumatoid factor of multiple sites without organ or systems involvement: Secondary | ICD-10-CM

## 2023-10-26 DIAGNOSIS — E119 Type 2 diabetes mellitus without complications: Secondary | ICD-10-CM

## 2023-10-26 DIAGNOSIS — E1142 Type 2 diabetes mellitus with diabetic polyneuropathy: Secondary | ICD-10-CM | POA: Insufficient documentation

## 2023-10-26 DIAGNOSIS — E1169 Type 2 diabetes mellitus with other specified complication: Secondary | ICD-10-CM

## 2023-10-26 DIAGNOSIS — I152 Hypertension secondary to endocrine disorders: Secondary | ICD-10-CM

## 2023-10-26 NOTE — Telephone Encounter (Signed)
 Qutenza denied -Have to try formulary drugs  Have to try Lidocaine patch

## 2023-10-26 NOTE — Progress Notes (Deleted)
 Subjective: CC:DM PCP: Raliegh Ip, DO [id number] [name] is a 73 y.o. female presenting to clinic today for:  1. Type 2 Diabetes with hypertension, hyperlipidemia:  Glucometer:***.   High at home: ***; Low at home: ***, Taking medication(s): ***,. No showed Clinical pharmacist 08/19/2023. ***  Diabetes Health Maintenance Due  Topic Date Due   OPHTHALMOLOGY EXAM  04/04/2022   HEMOGLOBIN A1C  01/26/2024   FOOT EXAM  07/27/2024    Last A1c:  Lab Results  Component Value Date   HGBA1C 9.0 (H) 07/28/2023    ROS: ***dizziness, LOC, polyuria, polydipsia, unintended weight loss/gain, foot ulcerations, numbness or tingling in extremities, shortness of breath or chest pain.   ROS: Per HPI  Allergies  Allergen Reactions   Ciprofloxacin Hcl Rash   Enbrel [Etanercept] Rash   Farxiga [Dapagliflozin] Other (See Comments)    Reports recurrent yeast infections; currently holding therapy   Metformin And Related Diarrhea   Past Medical History:  Diagnosis Date   Allergy    seasonal allergies   Anemia    hx of   Arthritis    rheumatoid   Depression    on meds   Diabetes (HCC)    Diabetic peripheral neuropathy (HCC)    GERD (gastroesophageal reflux disease)    on meds   Headache    Hyperlipidemia    on meds   Hyperplastic rectal polyp 10/21/2015   Hypertension    pt. denies   Lymphocytic colitis    Neuropathy    Rheumatoid arthritis (HCC)    Shortness of breath dyspnea    with exertion    Current Outpatient Medications:    Accu-Chek Softclix Lancets lancets, USE TO check blood sugar TWICE DAILY, Disp: , Rfl:    Alpha-Lipoic Acid 600 MG CAPS, TAKE (1) CAPSULE DAILY FOR NERVE PAIN, Disp: 90 capsule, Rfl: 0   Ascorbic Acid (VITAMIN C) 500 MG CHEW, 1 tablet, Disp: , Rfl:    aspirin 81 MG tablet, Take 81 mg by mouth daily., Disp: , Rfl:    atenolol (TENORMIN) 25 MG tablet, Take 1 tablet (25 mg total) by mouth daily., Disp: 90 tablet, Rfl: 3   blood glucose  meter kit and supplies, Dispense based on patient and insurance preference. Use up to four times daily as directed. (FOR ICD-10 E10.9, E11.9)., Disp: 1 each, Rfl: 0   buPROPion (WELLBUTRIN XL) 300 MG 24 hr tablet, Take 1 tablet (300 mg total) by mouth daily., Disp: 90 tablet, Rfl: 3   busPIRone (BUSPAR) 10 MG tablet, TAKE (1) TABLET TWICE A DAY., Disp: 60 tablet, Rfl: 1   cholecalciferol (VITAMIN D) 1000 UNITS tablet, Take 1,000 Units by mouth daily., Disp: , Rfl:    citalopram (CELEXA) 40 MG tablet, TAKE ONE TABLET ONCE DAILY, Disp: 90 tablet, Rfl: 3   Continuous Glucose Receiver (FREESTYLE LIBRE READER) DEVI, 1 Units by Does not apply route daily. UAD to test BGs daily. Dx E11.65, Disp: 1 each, Rfl: 1   Continuous Glucose Sensor (FREESTYLE LIBRE 3 SENSOR) MISC, Place 1 sensor on the skin every 14 days. Use to check glucose continuously E11.65, Disp: 6 each, Rfl: 3   Cyanocobalamin (VITAMIN B 12 PO), Take by mouth., Disp: , Rfl:    diclofenac Sodium (VOLTAREN ARTHRITIS PAIN) 1 % GEL, Apply 2 g topically 4 (four) times daily., Disp: 350 g, Rfl: 4   folic acid (FOLVITE) 1 MG tablet, Take 1 mg by mouth daily., Disp: , Rfl:    glipiZIDE (GLUCOTROL XL)  5 MG 24 hr tablet, Take 1 tablet (5 mg total) by mouth 2 (two) times daily with a meal., Disp: 180 tablet, Rfl: 3   glucose blood (ACCU-CHEK GUIDE TEST) test strip, Check sugar 2 times daily E11.9, Disp: 200 strip, Rfl: 3   insulin degludec (TRESIBA FLEXTOUCH) 100 UNIT/ML FlexTouch Pen, Inject 20-30 Units into the skin at bedtime. Dose change, Disp: 30 mL, Rfl: 3   Insulin Pen Needle (NOVOFINE PLUS PEN NEEDLE) 32G X 4 MM MISC, UAD to inject insulin. E11.65, Disp: 100 each, Rfl: 3   Lancet Device MISC, Check sugar 2 times daily E11.9, Disp: 100 each, Rfl: 12   losartan (COZAAR) 100 MG tablet, Take 1 tablet (100 mg total) by mouth daily., Disp: 90 tablet, Rfl: 3   methotrexate (RHEUMATREX) 2.5 MG tablet, 6 tablets Orally once weekly on the same day for 90  days, Disp: , Rfl:    omeprazole (PRILOSEC) 40 MG capsule, Take 1 capsule (40 mg total) by mouth daily., Disp: 90 capsule, Rfl: 3   oxyCODONE-acetaminophen (PERCOCET) 7.5-325 MG tablet, Take 1 tablet by mouth every 8 (eight) hours as needed for severe pain (pain score 7-10)., Disp: 90 tablet, Rfl: 0   pregabalin (LYRICA) 200 MG capsule, TAKE ONE CAPSULE BY MOUTH TWICE DAILY, Disp: 60 capsule, Rfl: 5   Pyridoxine HCl (VITAMIN B-6 PO), Take by mouth., Disp: , Rfl:    Semaglutide, 2 MG/DOSE, 8 MG/3ML SOPN, Inject 2 mg as directed once a week. Via [employer] patient assistance, Disp: , Rfl:    simvastatin (ZOCOR) 40 MG tablet, Take 1 tablet (40 mg total) by mouth daily., Disp: 90 tablet, Rfl: 3   sulfaSALAzine (AZULFIDINE) 500 MG tablet, Take 500 mg by mouth 2 (two) times daily., Disp: , Rfl:    tiZANidine (ZANAFLEX) 4 MG tablet, Take 0.5-1 tablets (2-4 mg total) by mouth every 8 (eight) hours as needed for muscle spasms., Disp: 90 tablet, Rfl: 0 Social History   Socioeconomic History   Marital status: Significant Other    Spouse name: Not on file   Number of children: 1   Years of education: 11th   Highest education level: 11th grade  Occupational History   Occupation: retired  Tobacco Use   Smoking status: Former    Current packs/day: 1.00    Average packs/day: 1 pack/day for 46.0 years (46.0 ttl pk-yrs)    Types: Cigarettes   Smokeless tobacco: Never  Vaping Use   Vaping status: Some Days  Substance and Sexual Activity   Alcohol use: No    Alcohol/week: 0.0 standard drinks of alcohol   Drug use: No   Sexual activity: Not Currently  Other Topics Concern   Not on file  Social History Narrative   The patient is divorced she has 1 son who is married that she has a grandchild.   She does not use alcohol or drugs she drinks 3-4 caffeinated beverages daily   She is a smoker   She lives with her boyfriend   Social Drivers of Corporate investment banker Strain: Low Risk  (04/20/2023)    Overall Financial Resource Strain (CARDIA)    Difficulty of Paying Living Expenses: Not hard at all  Food Insecurity: No Food Insecurity (04/20/2023)   Hunger Vital Sign    Worried About Running Out of Food in the Last Year: Never true    Ran Out of Food in the Last Year: Never true  Transportation Needs: No Transportation Needs (04/20/2023)   PRAPARE -  Administrator, Civil Service (Medical): No    Lack of Transportation (Non-Medical): No  Physical Activity: Inactive (04/20/2023)   Exercise Vital Sign    Days of Exercise per Week: 0 days    Minutes of Exercise per Session: 0 min  Stress: No Stress Concern Present (04/20/2023)   [employer] of Occupational Health - Occupational Stress Questionnaire    Feeling of Stress : Not at all  Social Connections: Moderately Isolated (04/20/2023)   Social Connection and Isolation Panel [NHANES]    Frequency of Communication with Friends and Family: More than three times a week    Frequency of Social Gatherings with Friends and Family: More than three times a week    Attends Religious Services: Never    Database administrator or Organizations: No    Attends Banker Meetings: Never    Marital Status: Living with partner  Intimate Partner Violence: Not At Risk (04/20/2023)   Humiliation, Afraid, Rape, and Kick questionnaire    Fear of Current or Ex-Partner: No    Emotionally Abused: No    Physically Abused: No    Sexually Abused: No   Family History  Problem Relation Age of Onset   Alzheimer's disease Mother    Lung cancer Mother 78   Diabetes Father    Heart disease Father    Arthritis Sister    Diabetes Sister    Diabetes Brother    Hypertension Brother    Colon cancer Neg Hx    Stomach cancer Neg Hx    Colon polyps Neg Hx    Esophageal cancer Neg Hx    Rectal cancer Neg Hx     Objective: Office vital signs reviewed. There were no vitals taken for this visit.  Physical Examination:  General: Awake, alert,  *** nourished, No acute distress HEENT: Normal    Neck: No masses palpated. No lymphadenopathy    Ears: Tympanic membranes intact, normal light reflex, no erythema, no bulging    Eyes: PERRLA, extraocular membranes intact, sclera ***    Nose: nasal turbinates moist, *** nasal discharge    Throat: moist mucus membranes, no erythema, *** tonsillar exudate.  Airway is patent Cardio: regular rate and rhythm, S1S2 heard, no murmurs appreciated Pulm: clear to auscultation bilaterally, no wheezes, rhonchi or rales; normal work of breathing on room air GI: soft, non-tender, non-distended, bowel sounds present x4, no hepatomegaly, no splenomegaly, no masses GU: external vaginal tissue ***, cervix ***, *** punctate lesions on cervix appreciated, *** discharge from cervical os, *** bleeding, *** cervical motion tenderness, *** abdominal/ adnexal masses Extremities: warm, well perfused, No edema, cyanosis or clubbing; +*** pulses bilaterally MSK: *** gait and *** station Skin: dry; intact; no rashes or lesions Neuro: *** Strength and light touch sensation grossly intact, *** DTRs ***/4  Diabetic Foot Exam - Simple   No data filed      Assessment/ Plan: 73 y.o. female   Insulin-treated type 2 diabetes mellitus (HCC)  Hypertension associated with diabetes (HCC)  Hyperlipidemia associated with type 2 diabetes mellitus (HCC)  Aortic atherosclerosis (HCC)  Diabetic polyneuropathy associated with type 2 diabetes mellitus (HCC)  Rheumatoid arthritis involving multiple sites with positive rheumatoid factor (HCC)   ***  Silvino Selman Hulen Skains, DO [facility] [phone]

## 2023-10-27 ENCOUNTER — Encounter: Payer: Self-pay | Admitting: Family Medicine

## 2023-11-01 ENCOUNTER — Ambulatory Visit
Admission: RE | Admit: 2023-11-01 | Discharge: 2023-11-01 | Disposition: A | Discharge status: Home / Self Care | Source: Ambulatory Visit | Attending: Physical Medicine & Rehabilitation | Admitting: Physical Medicine & Rehabilitation

## 2023-11-01 DIAGNOSIS — M545 Other chronic pain: Secondary | ICD-10-CM

## 2023-11-01 DIAGNOSIS — M47816 Spondylosis without myelopathy or radiculopathy, lumbar region: Secondary | ICD-10-CM | POA: Diagnosis not present

## 2023-11-01 DIAGNOSIS — M48061 Spinal stenosis, lumbar region without neurogenic claudication: Secondary | ICD-10-CM | POA: Diagnosis not present

## 2023-11-01 DIAGNOSIS — M5126 Other intervertebral disc displacement, lumbar region: Secondary | ICD-10-CM | POA: Diagnosis not present

## 2023-11-05 NOTE — Telephone Encounter (Signed)
 Left patient a message to call back to reschedule or come in for f/u

## 2023-11-08 ENCOUNTER — Encounter: Payer: Self-pay | Admitting: Physical Medicine & Rehabilitation

## 2023-11-08 ENCOUNTER — Encounter: Payer: Medicare HMO | Attending: Physical Medicine & Rehabilitation | Admitting: Physical Medicine & Rehabilitation

## 2023-11-08 VITALS — Ht 61.0 in

## 2023-11-08 DIAGNOSIS — M25511 Pain in right shoulder: Secondary | ICD-10-CM | POA: Diagnosis not present

## 2023-11-08 DIAGNOSIS — G8929 Other chronic pain: Secondary | ICD-10-CM | POA: Diagnosis not present

## 2023-11-08 DIAGNOSIS — M545 Low back pain, unspecified: Secondary | ICD-10-CM | POA: Diagnosis not present

## 2023-11-08 DIAGNOSIS — Z794 Long term (current) use of insulin: Secondary | ICD-10-CM

## 2023-11-08 DIAGNOSIS — E1142 Type 2 diabetes mellitus with diabetic polyneuropathy: Secondary | ICD-10-CM | POA: Insufficient documentation

## 2023-11-08 MED ORDER — LIDOCAINE HCL 1 % IJ SOLN: 4.0000 mL | Freq: Once | INTRAMUSCULAR | Status: DC

## 2023-11-08 MED ORDER — BETAMETHASONE SOD PHOS & ACET 6 (3-3) MG/ML IJ SUSP: 6.0000 mg | Freq: Once | INTRAMUSCULAR | Status: DC

## 2023-11-08 MED ORDER — LIDOCAINE 4 % EX PTCH: 2.0000 | MEDICATED_PATCH | CUTANEOUS | 3 refills | Status: AC

## 2023-11-08 NOTE — Progress Notes (Addendum)
 Subjective:    Patient ID: Donna Rogers, female    DOB: 1950/12/27, 73 y.o.   MRN: [id number]  HPI   HPI on 06/05/22 Donna Rogers is a 73 yo female with PMH of rheumatoid arthritis, chronic low back pain, diabetes mellitus type 2 with polyneuropathy, GERD, carpal tunnel right wrist, depression who is here for chronic pain.  Patient reports that she has been having burning and freezing pain in Donna feet that has been worsening over the past few years.  She says she feels like Donna feet are on ice.  Pain is worse on the dorsal and plantar aspects of Donna feet however she also has this pain over Donna ankles.  She was seen in the past by Dr. Rexanne Catalina for lumbar spine arthritis and reports that she had epidural steroid injections x2.  She reports the first injection helped a little bit but the second injection did not provide any relief.  She continues to have sharp and aching pain in Donna lower back and sometimes pain shoots into Donna thighs and legs.  Back pain is worsened with walking or activities.  Additionally she has severe pain in Donna right shoulder.  She reports shoulder injection did not help the pain.  Shoulder pain is worsened with movement.  She additionally has pain in both of Donna hips hands, knees.  Pain makes it difficult to sleep.  She is followed by rheumatology by Dr. Kirtland Perfect and Vinita Greenspan and is currently treated with methotrexate.  She says that she was was previously on Enbrel.   Prior treatments Tylenol and ibuprofen in past helped slightly Diclofenac oral did not help Blu Emu cream- helps a little Gabapentin 1200mg  BID to TID with mild to moderate improvement She has not tried Lyrica Hydrocodone helped in the past, it stopped working  She has not tried oxycodone Tizanidine did not help   Interval History Donna Rogers is here for follow-up due to Donna chronic pain.  She reports that Donna polyneuropathy is much improved since starting Lyrica.  She feels that this  works much better than the gabapentin.  She says she would like to try Qutenza however cost is prohibitive at this time.  She continues to have lower back pain and shoulder pain bilaterally that limits Donna activities.  The lower back pain did not improve significantly with the change to Lyrica.  She has not had any side effects with this medication.  She reports she previously tried tramadol and it initially helped mildly but later did not provide significant benefit.  She will occasionally get a tingling sensation in the fourth and fifth digits on both hands but this is not present currently.  She feels like she has poor balance overall however has not had any recent falls.  She does not use a cane for ambulation at this time but she has been home.     Interval History 10/01/22 Donna Rogers is here for follow-up regarding Donna chronic pain.  She reports that oxycodone is helping to control Donna lower back pain.  She uses this medication sparingly only when the pain is very severe.  The medication allows Donna to do more active activities and tasks at Donna house.  She continues to take Lyrica for burning sensation in Donna feet.  She does feel like the medication is not helping as much as it did when we spoke at Donna last visit.  Lyrica does not currently give Donna the drunk feeling she was getting  when she is taking gabapentin.  She denies any history of CKD or known history of kidney dysfunction.  She continues to have right shoulder pain.  She says that she would like to repeat the right shoulder injection.  She previously had this completed by Donna orthopedic doctor.  She asked if we can completed at Donna next visit if possible.     Interval History 4/1 Donna Rogers is here regarding Donna chronic pain and for a right shoulder injection.  She reports that oxycodone has been providing benefit to Donna pain however she feels like the 5 mg dose is not strong enough.  Previously when she tried 2 tablets of the oxycodone 5  mg this provided better relief for Donna pain.  She continues to have back pain that is sometimes shooting down Donna right leg.  She continues to have pain in Donna right shoulder that is limiting.  She does not have any side effects with these are working well for Donna and not.     Interval history 02/05/2023 Donna Rogers is here for follow-up regarding Donna chronic low back pain, shoulder pain and polyneuropathy.  She reports Donna shoulder is doing much better since we did the shoulder injection last visit.  Oxycodone 7.5 3 times daily is helping Donna pain stay very controlled.  She continues to have a lot of lumbar back pain however it is more tolerable with this medication.  For the past few months she has been using Lyrica 150 mg twice daily noted on PDMP review.  In February and March she was getting 200 mg of Lyrica twice daily however I think she may have reverted back to an older prescription and started taking up to 150 mg tabs from the pharmacy.  She is unsure what kind of difference is made to Donna pain.  She does continue to have pain in Donna feet that has been attributed to polyneuropathy.     Interval history 03/12/2023 Donna Rogers is here for follow-up regarding Donna chronic lower back pain, right shoulder pain and polyneuropathy.  Patient feels that Donna pain is doing better overall since Donna last visit.  She does ask when she can do Donna repeat shoulder injection as the pain is gradually coming back.  She does not feel like she needs an injection today because pain is still overall controlled.  She reports current medications are working well for Donna and not causing any significant side effects.  Patient reports she is using Percocet sparingly and this appears consistent with Donna pill count.  Interval history 05/13/2023 Donna Rogers is here for follow-up regarding Donna chronic lower back pain, right shoulder pain and polyneuropathy.  Patient reports that she continues to have pain in these areas however it is  doing better than it was previously.  She has not needed to use this many tabs of oxycodone and still has 90 tabs left today.  She reports Donna shoulder pain is under control as well.  She does have numbness and pain in Donna feet, she reports a cold sensation in Donna feet.  When she does need oxycodone she reports it is helping Donna pain.   Interval history 08/25/22 Donna Rogers is tomorrow.  She was recently started on CPAP for OSA. She continues to have pain in Donna lower back. Oxycodone helps Donna pain, she uses sparingly only when pain is severe.  Pain is also coming back in R shoulder.   She also has pain from polyneuropathy in Donna b/l feet.  Lyrica is helping but not keeping it under control.    Interval history 09/26/22 Pt continues to have severe pain in Donna feet. This visit was scheduled as f/u visit instead of Qutenza. She is still interested in Qutenza treatment. She also has pain in Donna lower back. Oxycodone continues to help Donna pain, still using it only when pain is very severe. She continues to have R shoulder pain.   Interval history 11/08/23 Patient is here for right shoulder injection, reports good benefit last time this was completed.  She also continues to have lower back pain, MRI completed but results pending.  Oxycodone helping keep Donna pain under control, she only uses intermittently when pain is very severe.  No side effects with the medication.  Qutenza treatment was declined by insurance, needs to try lidocaine patches first.  Pain Inventory Average Pain 5 Pain Right Now 6 My pain is sharp, stabbing, and aching  In the last 24 hours, has pain interfered with the following? General activity 2 Relation with others 6 Enjoyment of life 1 What TIME of day is your pain at its worst? morning , daytime, evening, and night Sleep (in general) Fair  Pain is worse with: walking, inactivity, standing, and some activites Pain improves with: rest and medication Relief from Meds:  7  Family History  Problem Relation Age of Onset   Alzheimer's disease Mother    Lung cancer Mother 54   Diabetes Father    Heart disease Father    Arthritis Sister    Diabetes Sister    Diabetes Brother    Hypertension Brother    Colon cancer Neg Hx    Stomach cancer Neg Hx    Colon polyps Neg Hx    Esophageal cancer Neg Hx    Rectal cancer Neg Hx    Social History   Socioeconomic History   Marital status: Significant Other    Spouse name: Not on file   Number of children: 1   Years of education: 11th   Highest education level: 11th grade  Occupational History   Occupation: retired  Tobacco Use   Smoking status: Former    Current packs/day: 1.00    Average packs/day: 1 pack/day for 46.0 years (46.0 ttl pk-yrs)    Types: Cigarettes   Smokeless tobacco: Never  Vaping Use   Vaping status: Some Days  Substance and Sexual Activity   Alcohol use: No    Alcohol/week: 0.0 standard drinks of alcohol   Drug use: No   Sexual activity: Not Currently  Other Topics Concern   Not on file  Social History Narrative   The patient is divorced she has 1 son who is married that she has a grandchild.   She does not use alcohol or drugs she drinks 3-4 caffeinated beverages daily   She is a smoker   She lives with Donna boyfriend   Social Drivers of Corporate investment banker Strain: Low Risk  (04/20/2023)   Overall Financial Resource Strain (CARDIA)    Difficulty of Paying Living Expenses: Not hard at all  Food Insecurity: No Food Insecurity (04/20/2023)   Hunger Vital Sign    Worried About Running Out of Food in the Last Year: Never true    Ran Out of Food in the Last Year: Never true  Transportation Needs: No Transportation Needs (04/20/2023)   PRAPARE - Administrator, Civil Service (Medical): No    Lack of Transportation (Non-Medical): No  Physical Activity: Inactive (  04/20/2023)   Exercise Vital Sign    Days of Exercise per Week: 0 days    Minutes of Exercise per  Session: 0 min  Stress: No Stress Concern Present (04/20/2023)   [employer] of Occupational Health - Occupational Stress Questionnaire    Feeling of Stress : Not at all  Social Connections: Moderately Isolated (04/20/2023)   Social Connection and Isolation Panel [NHANES]    Frequency of Communication with Friends and Family: More than three times a week    Frequency of Social Gatherings with Friends and Family: More than three times a week    Attends Religious Services: Never    Database administrator or Organizations: No    Attends Banker Meetings: Never    Marital Status: Living with partner   Past Surgical History:  Procedure Laterality Date   BREAST LUMPECTOMY Right 2018   BREAST LUMPECTOMY WITH RADIOACTIVE SEED LOCALIZATION Left 03/03/2016   Procedure: BREAST LUMPECTOMY WITH RADIOACTIVE SEED LOCALIZATION;  Surgeon: Oza Blumenthal, MD;  Location: [facility] OR;  Service: General;  Laterality: Left;   COLONOSCOPY  08/2002   RMR: internal hemorrhoids   COLONOSCOPY N/A 10/21/2015   Procedure: COLONOSCOPY;  Surgeon: Suzette Espy, MD;  Location: [facility];  Service: Endoscopy;  Laterality: N/A;  250 - moved to 2:35 - office to notify pt   CYST REMOVAL TRUNK     ESOPHAGOGASTRODUODENOSCOPY  08/2002   RMR: nonerosive reflux esophagitis, hiatal hernia   TONSILLECTOMY     WISDOM TOOTH EXTRACTION     Past Surgical History:  Procedure Laterality Date   BREAST LUMPECTOMY Right 2018   BREAST LUMPECTOMY WITH RADIOACTIVE SEED LOCALIZATION Left 03/03/2016   Procedure: BREAST LUMPECTOMY WITH RADIOACTIVE SEED LOCALIZATION;  Surgeon: Oza Blumenthal, MD;  Location: [facility] OR;  Service: General;  Laterality: Left;   COLONOSCOPY  08/2002   RMR: internal hemorrhoids   COLONOSCOPY N/A 10/21/2015   Procedure: COLONOSCOPY;  Surgeon: Suzette Espy, MD;  Location: [facility];  Service: Endoscopy;  Laterality: N/A;  250 - moved to 2:35 - office to notify pt   CYST REMOVAL TRUNK      ESOPHAGOGASTRODUODENOSCOPY  08/2002   RMR: nonerosive reflux esophagitis, hiatal hernia   TONSILLECTOMY     WISDOM TOOTH EXTRACTION     Past Medical History:  Diagnosis Date   Allergy    seasonal allergies   Anemia    hx of   Arthritis    rheumatoid   Depression    on meds   Diabetes (HCC)    Diabetic peripheral neuropathy (HCC)    GERD (gastroesophageal reflux disease)    on meds   Headache    Hyperlipidemia    on meds   Hyperplastic rectal polyp 10/21/2015   Hypertension    pt. denies   Lymphocytic colitis    Neuropathy    Rheumatoid arthritis (HCC)    Shortness of breath dyspnea    with exertion   There were no vitals taken for this visit.  Opioid Risk Score:   Fall Risk Score:  `1  Depression screen PHQ 2/9     08/26/2023    1:00 PM 07/28/2023    2:33 PM 07/28/2023    2:32 PM 05/13/2023    1:54 PM 04/20/2023    2:38 PM 03/12/2023    2:32 PM 03/05/2023   10:32 AM  Depression screen PHQ 2/9  Decreased Interest 0 1 0 0 0 0 1  Down, Depressed, Hopeless 0 2 0  0 0 0 1  PHQ - 2 Score 0 3 0 0 0 0 2  Altered sleeping  3 0  0  3  Tired, decreased energy  3 0  0  3  Change in appetite  0 0  0  0  Feeling bad or failure about yourself   3 0  0  1  Trouble concentrating  1 0  0  0  Moving slowly or fidgety/restless  3 0  0  0  Suicidal thoughts  0 0  0  0  PHQ-9 Score  16 0  0  9  Difficult doing work/chores  Very difficult Not difficult at all  Not difficult at all  Somewhat difficult      Review of Systems  Musculoskeletal:  Positive for back pain.  All other systems reviewed and are negative.      Objective:   Physical Exam   Physical Exam Gen: no distress, normal appearing HEENT: oral mucosa pink and moist, NCAT Chest: normal effort, normal rate of breathing Abd: soft, non-distended Ext: no edema Psych: pleasant, appropriate Skin: intact Neuro: CN 2-12 grossly intact, follows commands No focal motor deficits noted Decreased sensation to LT in b/l  LE below the knee  Musculoskeletal: R shoulder pain with abduction and internal rotation  Mild paraspinal TTP lower L-spine Facet loading negative /minimal worsening of pain with spinal extension    Lumbar xray 08/26/17 FINDINGS: Five lumbar type vertebral bodies are well visualized. Vertebral body height is well maintained. Multilevel disc space narrowing is noted throughout the lumbar spine progressed in the interval from the prior exam. Osteophytic changes are noted as well. No anterolisthesis is seen. A somewhat rounded calcification is noted in the right upper quadrant which may be related to cholelithiasis. Clinical correlation is recommended.   IMPRESSION: Multilevel degenerative changes progressed when compared with the prior exam of 20/8.   Rounded calcification in the right upper quadrant may represent cholelithiasis.       MRI shoulder 08/22/21 IMPRESSION: 1. Full-thickness, full width tears of the supraspinatus and infraspinatus tendons with retraction to the glenoid. 2. Full-thickness subscapularis tear involving the mid to superior fibers with tendon gap up to 1.0 cm, and interstitial extension proximally. 3. Grade 2 muscle atrophy of the supraspinatus, infraspinatus, and subscapularis muscles. 4. Moderate glenohumeral and acromioclavicular joint osteoarthritis. 5. Likely torn and retracted long head biceps tendon. 6. Degenerative posterosuperior labral tearing.           Assessment & Plan:     Chronic lower back pain, lumbar spondylosis -UDS and pain agreement completed prior visit -Continue oxycodone 7.5mg  Q8h prn #90-refilled  -Continue UDS and pill counts.  Continue PDMP monitoring.  Pain contract completed prior visit.  -Continue use of TENS -Repeat UDS, Pill count consistent  -Home exercises for back pain provided prior visit -Pending MRI L spine-completed but results pending -She has 64 tabs oxycodone left, call when refill needed    Right  shoulder pain with biceps SS, IS and Subscapularis tears, GH and AC arthritis  -Continue medications as above -Voltaren gel as needed -Shoulder injection completed prior visit.   --Shoulder injection   Indication:Right Shoulder pain not relieved by medication management and other conservative care.   Informed consent was obtained after describing risks and benefits of the procedure with the patient, this includes bleeding, bruising, infection and medication side effects. The patient wishes to proceed and has given written consent. Patient was placed in a seated position. The Right  shoulder was marked and prepped with betadine in the subacromial area. A 25-gauge 1-1/2 inch needle was inserted into the subacromial area. After negative draw back for blood, a solution containing 1 mL of 6 mg per ML betamethasone and 4 mL of 1% lidocaine was injected. A band aid was applied. The patient tolerated the procedure well. Post procedure instructions were given.  .  DM with polyneuropathy --Discussed Qutenza as an option for neuropathic pain control. Discussed that this is a capsaicin patch, stronger than capsaicin cream. Discussed that it is currently approved for diabetic peripheral neuropathy and post-herpetic neuralgia, but that it has also shown benefit in treating other forms of neuropathy. Provided patient with link to site to learn more about the patch: https://www.clark.biz/. Discussed that the patch would be placed in office and benefits usually last 3 months. Discussed that unintended exposure to capsaicin can cause severe irritation of eyes, mucous membranes, respiratory tract, and skin, but that Qutenza is a local treatment and does not have the systemic side effects of other nerve medications. Discussed that there may be pain, itching, erythema, and decreased sensory function associated with the application of Qutenza. Side effects usually subside within 1 week. A cold pack of analgesic medications  can help with these side effects. Blood pressure can also be increased due to pain associated with administration of the patch. -Cont Lyrica to 200mg  BID -Qutenza was declined by insurance.  Will order lidocaine patches for Donna feet  Intermittent numbness in Donna fourth and fifth digit, Possibly some ulnar nerve irritation,  Continue to monitor.   Rheumatoid arthritis and polyarthralgia history -Continue f/u with rheumatology    Called to f/u on MRI, no answer 12/02/23- discrete VM left

## 2023-11-22 DIAGNOSIS — G4733 Obstructive sleep apnea (adult) (pediatric): Secondary | ICD-10-CM | POA: Diagnosis not present

## 2023-12-02 ENCOUNTER — Telehealth: Payer: Self-pay | Admitting: Physical Medicine & Rehabilitation

## 2023-12-02 NOTE — Telephone Encounter (Signed)
 Patient called in requesting to know MRI results , states she had it done 3/17 and has not received a call to review results

## 2023-12-08 DIAGNOSIS — M8580 Other specified disorders of bone density and structure, unspecified site: Secondary | ICD-10-CM | POA: Diagnosis not present

## 2023-12-08 DIAGNOSIS — M79642 Pain in left hand: Secondary | ICD-10-CM | POA: Diagnosis not present

## 2023-12-08 DIAGNOSIS — M1991 Primary osteoarthritis, unspecified site: Secondary | ICD-10-CM | POA: Diagnosis not present

## 2023-12-08 DIAGNOSIS — Z6838 Body mass index (BMI) 38.0-38.9, adult: Secondary | ICD-10-CM | POA: Diagnosis not present

## 2023-12-08 DIAGNOSIS — R5382 Chronic fatigue, unspecified: Secondary | ICD-10-CM | POA: Diagnosis not present

## 2023-12-08 DIAGNOSIS — E669 Obesity, unspecified: Secondary | ICD-10-CM | POA: Diagnosis not present

## 2023-12-08 DIAGNOSIS — M0579 Rheumatoid arthritis with rheumatoid factor of multiple sites without organ or systems involvement: Secondary | ICD-10-CM | POA: Diagnosis not present

## 2023-12-08 DIAGNOSIS — M79641 Pain in right hand: Secondary | ICD-10-CM | POA: Diagnosis not present

## 2023-12-08 DIAGNOSIS — K52832 Lymphocytic colitis: Secondary | ICD-10-CM | POA: Diagnosis not present

## 2023-12-22 DIAGNOSIS — G4733 Obstructive sleep apnea (adult) (pediatric): Secondary | ICD-10-CM | POA: Diagnosis not present

## 2023-12-23 ENCOUNTER — Telehealth: Payer: Self-pay | Admitting: Pharmacist

## 2023-12-23 NOTE — Telephone Encounter (Signed)
    Diabetes:  Patient in called in to report the following: Patient reports her blood sugars are running 200-300 She reports missed doses of insulin  (she is administering her own insulin  at night) She reports taking Ozempic  4mg  weekly (not recommended) and is out of her Ozempic  now; should only be taking Ozempic  2mg  weekly Will look in to ordering more, but will take ~2 weeks Encouraged patient to increase Tresiba  to 24 units nightly Encouraged Alvie Jolly (friend) to assist with making sure patient administers Patient seemed very confused on what she was taking  Denies any low blood sugars less <70 CGM didn't work for patient in the past, but would like to retrial Alvie Jolly assisting with traditional BG checks   PCP f/u next week  Routing to CPhT to assist with refill of Ozempic  2mg  weekly   Tyneisha Hegeman Dattero Luree Palla, PharmD, BCACP, CPP Clinical Pharmacist, [facility]

## 2023-12-27 ENCOUNTER — Other Ambulatory Visit (HOSPITAL_COMMUNITY): Payer: Self-pay

## 2023-12-27 ENCOUNTER — Telehealth: Payer: Self-pay

## 2023-12-27 ENCOUNTER — Other Ambulatory Visit: Payer: Self-pay

## 2023-12-27 NOTE — Progress Notes (Unsigned)
 Pharmacy Medication Assistance Program Note    12/30/2023  Patient ID: ANYELIN HORWITZ, female   DOB: 11/05/1950, 73 y.o.   MRN: [id number]     12/27/2023  Outreach Medication One  Manufacturer Medication One Novo Nordisk  [employer] Ozempic   Type of Radiographer, therapeutic Assistance  Date Application Submitted to Manufacturer 12/27/2023  Method Application Sent to Manufacturer Online  Patient Assistance Determination Approved  Approval Start Date 12/29/2023  Approval End Date 08/16/2024        12/30/2023  Patient ID: [name], female  DOB: 08-01-1951, 73 y.o.  MRN:  [id number]     12/27/2023  Outreach Medication Two  Manufacturer Medication Two Novo Nordisk  [employer] Tresiba   Type of Journalist, newspaper to [employer]  Date Application Submitted to Manufacturer 12/27/2023  Patient Assistance Determination Approved  Approval Start Date 12/29/2023    RENEWAL APPROVED

## 2023-12-29 ENCOUNTER — Telehealth: Payer: Self-pay

## 2023-12-29 ENCOUNTER — Ambulatory Visit: Admitting: Family Medicine

## 2023-12-29 NOTE — Telephone Encounter (Signed)
 Error

## 2024-01-03 ENCOUNTER — Encounter: Payer: Self-pay | Admitting: Family Medicine

## 2024-01-03 ENCOUNTER — Other Ambulatory Visit: Payer: Self-pay | Admitting: Family Medicine

## 2024-01-03 ENCOUNTER — Ambulatory Visit: Admitting: Family Medicine

## 2024-01-03 ENCOUNTER — Ambulatory Visit (INDEPENDENT_AMBULATORY_CARE_PROVIDER_SITE_OTHER)

## 2024-01-03 VITALS — BP 131/76 | HR 78 | Temp 97.9°F | Ht 61.0 in | Wt 204.2 lb

## 2024-01-03 DIAGNOSIS — E1169 Type 2 diabetes mellitus with other specified complication: Secondary | ICD-10-CM

## 2024-01-03 DIAGNOSIS — Z780 Asymptomatic menopausal state: Secondary | ICD-10-CM

## 2024-01-03 DIAGNOSIS — I152 Hypertension secondary to endocrine disorders: Secondary | ICD-10-CM

## 2024-01-03 DIAGNOSIS — E1142 Type 2 diabetes mellitus with diabetic polyneuropathy: Secondary | ICD-10-CM | POA: Diagnosis not present

## 2024-01-03 DIAGNOSIS — E1159 Type 2 diabetes mellitus with other circulatory complications: Secondary | ICD-10-CM | POA: Diagnosis not present

## 2024-01-03 DIAGNOSIS — Z794 Long term (current) use of insulin: Secondary | ICD-10-CM

## 2024-01-03 DIAGNOSIS — R300 Dysuria: Secondary | ICD-10-CM

## 2024-01-03 DIAGNOSIS — K2100 Gastro-esophageal reflux disease with esophagitis, without bleeding: Secondary | ICD-10-CM

## 2024-01-03 DIAGNOSIS — F32A Depression, unspecified: Secondary | ICD-10-CM | POA: Diagnosis not present

## 2024-01-03 DIAGNOSIS — E785 Hyperlipidemia, unspecified: Secondary | ICD-10-CM | POA: Diagnosis not present

## 2024-01-03 DIAGNOSIS — E119 Type 2 diabetes mellitus without complications: Secondary | ICD-10-CM | POA: Diagnosis not present

## 2024-01-03 LAB — URINALYSIS, ROUTINE W REFLEX MICROSCOPIC
Bilirubin, UA: NEGATIVE
Glucose, UA: NEGATIVE
Nitrite, UA: NEGATIVE
RBC, UA: NEGATIVE
Specific Gravity, UA: >1.03 — ABNORMAL HIGH (ref 1.005–1.030)
Urobilinogen, Ur: 0.2 mg/dL (ref 0.2–1.0)
pH, UA: 5.5 (ref 5.0–7.5)

## 2024-01-03 LAB — MICROSCOPIC EXAMINATION
RBC, Urine: NONE SEEN /HPF (ref 0–2)
Renal Epithel, UA: NONE SEEN /HPF
Yeast, UA: NONE SEEN

## 2024-01-03 LAB — BAYER DCA HB A1C WAIVED: HB A1C (BAYER DCA - WAIVED): 12.4 % — ABNORMAL HIGH (ref 4.8–5.6)

## 2024-01-03 MED ORDER — LOSARTAN POTASSIUM 100 MG PO TABS: 100.0000 mg | ORAL_TABLET | Freq: Every day | ORAL | 3 refills | Status: AC

## 2024-01-03 MED ORDER — BUPROPION HCL ER (XL) 300 MG PO TB24: 300.0000 mg | ORAL_TABLET | Freq: Every day | ORAL | 3 refills | Status: AC

## 2024-01-03 MED ORDER — OMEPRAZOLE 40 MG PO CPDR: 40.0000 mg | DELAYED_RELEASE_CAPSULE | Freq: Every day | ORAL | 3 refills | Status: AC

## 2024-01-03 MED ORDER — ATENOLOL 25 MG PO TABS: 25.0000 mg | ORAL_TABLET | Freq: Every day | ORAL | 3 refills | Status: AC

## 2024-01-03 MED ORDER — SIMVASTATIN 40 MG PO TABS: 40.0000 mg | ORAL_TABLET | Freq: Every day | ORAL | 3 refills | Status: AC

## 2024-01-03 MED ORDER — CITALOPRAM HYDROBROMIDE 40 MG PO TABS: ORAL_TABLET | ORAL | 3 refills | Status: AC

## 2024-01-03 MED ORDER — FLUCONAZOLE 150 MG PO TABS: 150.0000 mg | ORAL_TABLET | Freq: Once | ORAL | 0 refills | Status: AC

## 2024-01-03 MED ORDER — GLIPIZIDE ER 5 MG PO TB24: 5.0000 mg | ORAL_TABLET | Freq: Two times a day (BID) | ORAL | 3 refills | Status: AC

## 2024-01-03 NOTE — Patient Instructions (Signed)
 If your FASTING (before breakfast) blood sugar is above 150 for 3 consecutive days, increase by 2 units of your long acting insulin  (tresiba ).  Example: Day 1: Blood sugar was 159 Day 2: Blood sugar was 205  Increase Tresiba  to 26 units Day 3: Blood sugar is 202 Day 4: Blood sugars 168  Increase tresiba  to 28 units Day 5: Blood sugar is 105 Day 6: Blood sugars 145  Stick with 28 units.  Do not increase.  Please also check sugar 2 hours after your largest meal of the day.  This will help us  tell if you need mealtime insulin  as well.  See Concha Deed in next 2 weeks to review.

## 2024-01-03 NOTE — Progress Notes (Signed)
 Subjective: CC:DM PCP: Eliodoro Guerin, DO ZOX:[id number] A Kizziah is a 73 y.o. female presenting to clinic today for:  1. Type 2 Diabetes with hypertension, hyperlipidemia and polyneuropathy:  Glucometer using fingerstick glucose checks.  Freestyle libre would not stay on.  She is injecting 24 units of Tresiba  daily, compliant with Ozempic  2 mg weekly, glipizide  5 mg twice daily extended release, losartan  100 mg daily, Zocor  40 mg daily.  She reports eating very little yet her blood sugars remain in the 200s.  It is rare that she has anything below 200 in the morning.  She denies any missed doses of medications.  She is getting her injectables through patient assistance program  Diabetes Health Maintenance Due  Topic Date Due   OPHTHALMOLOGY EXAM  04/04/2022   HEMOGLOBIN A1C  01/26/2024   FOOT EXAM  07/27/2024    Last A1c:  Lab Results  Component Value Date   HGBA1C 9.0 (H) 07/28/2023    ROS: Reports polyuria, polydipsia, dental changes, visual disturbance.  No chest pain, shortness of breath or falls reported.  She is worried that she has a UTI because she has been having some burning and pressure for the last few weeks.  Denies any recent steroid use, changes in meds or illness   ROS: Per HPI  Allergies  Allergen Reactions   Ciprofloxacin Hcl Rash   Enbrel [Etanercept] Rash   Farxiga  [Dapagliflozin ] Other (See Comments)    Reports recurrent yeast infections; currently holding therapy   Metformin  And Related Diarrhea   Past Medical History:  Diagnosis Date   Allergy    seasonal allergies   Anemia    hx of   Arthritis    rheumatoid   Depression    on meds   Diabetes (HCC)    Diabetic peripheral neuropathy (HCC)    GERD (gastroesophageal reflux disease)    on meds   Headache    Hyperlipidemia    on meds   Hyperplastic rectal polyp 10/21/2015   Hypertension    pt. denies   Lymphocytic colitis    Neuropathy    Rheumatoid arthritis (HCC)    Shortness of  breath dyspnea    with exertion    Current Outpatient Medications:    Accu-Chek Softclix Lancets lancets, USE TO check blood sugar TWICE DAILY, Disp: , Rfl:    Alpha-Lipoic Acid 600 MG CAPS, TAKE (1) CAPSULE DAILY FOR NERVE PAIN, Disp: 90 capsule, Rfl: 0   Ascorbic Acid (VITAMIN C) 500 MG CHEW, 1 tablet, Disp: , Rfl:    aspirin 81 MG tablet, Take 81 mg by mouth daily., Disp: , Rfl:    atenolol  (TENORMIN ) 25 MG tablet, Take 1 tablet (25 mg total) by mouth daily., Disp: 90 tablet, Rfl: 3   blood glucose meter kit and supplies, Dispense based on patient and insurance preference. Use up to four times daily as directed. (FOR ICD-10 E10.9, E11.9)., Disp: 1 each, Rfl: 0   buPROPion  (WELLBUTRIN  XL) 300 MG 24 hr tablet, Take 1 tablet (300 mg total) by mouth daily., Disp: 90 tablet, Rfl: 3   busPIRone  (BUSPAR ) 10 MG tablet, TAKE (1) TABLET TWICE A DAY., Disp: 60 tablet, Rfl: 1   cholecalciferol (VITAMIN D ) 1000 UNITS tablet, Take 1,000 Units by mouth daily., Disp: , Rfl:    citalopram  (CELEXA ) 40 MG tablet, TAKE ONE TABLET ONCE DAILY, Disp: 90 tablet, Rfl: 3   Continuous Glucose Receiver (FREESTYLE LIBRE READER) DEVI, 1 Units by Does not apply route daily. UAD to  test BGs daily. Dx E11.65, Disp: 1 each, Rfl: 1   Continuous Glucose Sensor (FREESTYLE LIBRE 3 SENSOR) MISC, Place 1 sensor on the skin every 14 days. Use to check glucose continuously E11.65, Disp: 6 each, Rfl: 3   Cyanocobalamin (VITAMIN B 12 PO), Take by mouth., Disp: , Rfl:    diclofenac  Sodium (VOLTAREN  ARTHRITIS PAIN) 1 % GEL, Apply 2 g topically 4 (four) times daily., Disp: 350 g, Rfl: 4   folic acid  (FOLVITE ) 1 MG tablet, Take 1 mg by mouth daily., Disp: , Rfl:    glipiZIDE  (GLUCOTROL  XL) 5 MG 24 hr tablet, Take 1 tablet (5 mg total) by mouth 2 (two) times daily with a meal., Disp: 180 tablet, Rfl: 3   glucose blood (ACCU-CHEK GUIDE TEST) test strip, Check sugar 2 times daily E11.9, Disp: 200 strip, Rfl: 3   insulin  degludec (TRESIBA   FLEXTOUCH) 100 UNIT/ML FlexTouch Pen, Inject 20-30 Units into the skin at bedtime. Dose change, Disp: 30 mL, Rfl: 3   Insulin  Pen Needle (NOVOFINE PLUS PEN NEEDLE) 32G X 4 MM MISC, UAD to inject insulin . E11.65, Disp: 100 each, Rfl: 3   Lancet Device MISC, Check sugar 2 times daily E11.9, Disp: 100 each, Rfl: 12   lidocaine  4 %, Place 2 patches onto the skin daily., Disp: 28 patch, Rfl: 3   losartan  (COZAAR ) 100 MG tablet, Take 1 tablet (100 mg total) by mouth daily., Disp: 90 tablet, Rfl: 3   methotrexate (RHEUMATREX) 2.5 MG tablet, 6 tablets Orally once weekly on the same day for 90 days, Disp: , Rfl:    omeprazole  (PRILOSEC) 40 MG capsule, Take 1 capsule (40 mg total) by mouth daily., Disp: 90 capsule, Rfl: 3   oxyCODONE -acetaminophen  (PERCOCET) 7.5-325 MG tablet, Take 1 tablet by mouth every 8 (eight) hours as needed for severe pain (pain score 7-10)., Disp: 90 tablet, Rfl: 0   pregabalin  (LYRICA ) 200 MG capsule, TAKE ONE CAPSULE BY MOUTH TWICE DAILY, Disp: 60 capsule, Rfl: 5   Pyridoxine HCl (VITAMIN B-6 PO), Take by mouth., Disp: , Rfl:    Semaglutide , 2 MG/DOSE, 8 MG/3ML SOPN, Inject 2 mg as directed once a week. Via [employer] patient assistance, Disp: , Rfl:    simvastatin  (ZOCOR ) 40 MG tablet, Take 1 tablet (40 mg total) by mouth daily., Disp: 90 tablet, Rfl: 3   sulfaSALAzine (AZULFIDINE) 500 MG tablet, Take 500 mg by mouth 2 (two) times daily., Disp: , Rfl:    tiZANidine  (ZANAFLEX ) 4 MG tablet, Take 0.5-1 tablets (2-4 mg total) by mouth every 8 (eight) hours as needed for muscle spasms., Disp: 90 tablet, Rfl: 0  Current Facility-Administered Medications:    betamethasone  acetate-betamethasone  sodium phosphate  (CELESTONE ) injection 6 mg, 6 mg, Intra-articular, Once,    lidocaine  (XYLOCAINE ) 1 % (with pres) injection 4 mL, 4 mL, Other, Once,  Social History   Socioeconomic History   Marital status: Significant Other    Spouse name: Not on file   Number of children: 1   Years of  education: 11th   Highest education level: 11th grade  Occupational History   Occupation: retired  Tobacco Use   Smoking status: Former    Current packs/day: 1.00    Average packs/day: 1 pack/day for 46.0 years (46.0 ttl pk-yrs)    Types: Cigarettes   Smokeless tobacco: Never  Vaping Use   Vaping status: Some Days  Substance and Sexual Activity   Alcohol use: No    Alcohol/week: 0.0 standard drinks of alcohol  Drug use: No   Sexual activity: Not Currently  Other Topics Concern   Not on file  Social History Narrative   The patient is divorced she has 1 son who is married that she has a grandchild.   She does not use alcohol or drugs she drinks 3-4 caffeinated beverages daily   She is a smoker   She lives with her boyfriend   Social Drivers of Corporate investment banker Strain: Low Risk  (04/20/2023)   Overall Financial Resource Strain (CARDIA)    Difficulty of Paying Living Expenses: Not hard at all  Food Insecurity: No Food Insecurity (04/20/2023)   Hunger Vital Sign    Worried About Running Out of Food in the Last Year: Never true    Ran Out of Food in the Last Year: Never true  Transportation Needs: No Transportation Needs (04/20/2023)   PRAPARE - Administrator, Civil Service (Medical): No    Lack of Transportation (Non-Medical): No  Physical Activity: Inactive (04/20/2023)   Exercise Vital Sign    Days of Exercise per Week: 0 days    Minutes of Exercise per Session: 0 min  Stress: No Stress Concern Present (04/20/2023)   [employer] of Occupational Health - Occupational Stress Questionnaire    Feeling of Stress : Not at all  Social Connections: Moderately Isolated (04/20/2023)   Social Connection and Isolation Panel [NHANES]    Frequency of Communication with Friends and Family: More than three times a week    Frequency of Social Gatherings with Friends and Family: More than three times a week    Attends Religious Services: Never    Database administrator  or Organizations: No    Attends Banker Meetings: Never    Marital Status: Living with partner  Intimate Partner Violence: Not At Risk (04/20/2023)   Humiliation, Afraid, Rape, and Kick questionnaire    Fear of Current or Ex-Partner: No    Emotionally Abused: No    Physically Abused: No    Sexually Abused: No   Family History  Problem Relation Age of Onset   Alzheimer's disease Mother    Lung cancer Mother 38   Diabetes Father    Heart disease Father    Arthritis Sister    Diabetes Sister    Diabetes Brother    Hypertension Brother    Colon cancer Neg Hx    Stomach cancer Neg Hx    Colon polyps Neg Hx    Esophageal cancer Neg Hx    Rectal cancer Neg Hx     Objective: Office vital signs reviewed. BP 131/76   Pulse 78   Temp 97.9 F (36.6 C)   Ht 5\' 1"  (1.549 m)   Wt 204 lb 3.2 oz (92.6 kg)   SpO2 91%   BMI 38.58 kg/m   Physical Examination:  General: Awake, alert, nontoxic obese female, No acute distress HEENT: Poor dentition Cardio: regular rate and rhythm, S1S2 heard, no murmurs appreciated Pulm: clear to auscultation bilaterally, no wheezes, rhonchi or rales; normal work of breathing on room air     Assessment/ Plan: 73 y.o. female   Insulin -requiring or dependent type II diabetes mellitus (HCC) - Plan: Bayer DCA Hb A1c Waived, glipiZIDE  (GLUCOTROL  XL) 5 MG 24 hr tablet  Hypertension associated with diabetes (HCC) - Plan: atenolol  (TENORMIN ) 25 MG tablet, losartan  (COZAAR ) 100 MG tablet  Hyperlipidemia associated with type 2 diabetes mellitus (HCC) - Plan: simvastatin  (ZOCOR ) 40 MG tablet  Diabetic polyneuropathy associated with type 2 diabetes mellitus (HCC)  Dysuria - Plan: Urinalysis, Routine w reflex microscopic, Urine Culture, fluconazole  (DIFLUCAN ) 150 MG tablet  Depressive disorder - Plan: buPROPion  (WELLBUTRIN  XL) 300 MG 24 hr tablet, citalopram  (CELEXA ) 40 MG tablet  Gastroesophageal reflux disease with esophagitis without hemorrhage  - Plan: omeprazole  (PRILOSEC) 40 MG capsule  A1c not at goal and sharply rising to 12.6 today.  I advised her to increase her Tresiba  to 26 units nightly and I have given her instructions on how to increase over the next couple of weeks until her blood sugars are below 150 in the morning.  I would like her to see Concha Deed in the next couple of weeks to review postprandial glucose readings as she may need mealtime insulin .  I have renewed her blood pressure and cholesterol medications.  These issues are chronic and stable  She is on Lyrica  prescribed by her pain specialist for polyneuropathy  Urinalysis demonstrated no evidence of bacterial infection but possible high fate noted and so I am going to empirically treat her with Diflucan   Obviously depressed about her medical issues.  Continue current regimen for now.  Did not discuss GERD but PPI needed renewal  Monalisa Bayless Bambi Bonine, DO [facility] [phone]

## 2024-01-04 ENCOUNTER — Ambulatory Visit: Payer: Self-pay | Admitting: Family Medicine

## 2024-01-05 LAB — URINE CULTURE

## 2024-01-07 ENCOUNTER — Encounter: Payer: Self-pay | Admitting: Physical Medicine & Rehabilitation

## 2024-01-07 ENCOUNTER — Encounter: Attending: Physical Medicine & Rehabilitation | Admitting: Physical Medicine & Rehabilitation

## 2024-01-07 ENCOUNTER — Ambulatory Visit: Payer: Self-pay | Admitting: Family Medicine

## 2024-01-07 VITALS — BP 131/84 | HR 72 | Wt 206.0 lb

## 2024-01-07 DIAGNOSIS — Z5181 Encounter for therapeutic drug level monitoring: Secondary | ICD-10-CM | POA: Insufficient documentation

## 2024-01-07 DIAGNOSIS — G894 Chronic pain syndrome: Secondary | ICD-10-CM | POA: Diagnosis not present

## 2024-01-07 DIAGNOSIS — Z79891 Long term (current) use of opiate analgesic: Secondary | ICD-10-CM | POA: Insufficient documentation

## 2024-01-07 DIAGNOSIS — Z780 Asymptomatic menopausal state: Secondary | ICD-10-CM | POA: Diagnosis not present

## 2024-01-07 DIAGNOSIS — M25511 Pain in right shoulder: Secondary | ICD-10-CM | POA: Diagnosis not present

## 2024-01-07 DIAGNOSIS — M545 Low back pain, unspecified: Secondary | ICD-10-CM | POA: Insufficient documentation

## 2024-01-07 DIAGNOSIS — M47819 Spondylosis without myelopathy or radiculopathy, site unspecified: Secondary | ICD-10-CM | POA: Diagnosis not present

## 2024-01-07 DIAGNOSIS — G8929 Other chronic pain: Secondary | ICD-10-CM | POA: Insufficient documentation

## 2024-01-07 DIAGNOSIS — M85852 Other specified disorders of bone density and structure, left thigh: Secondary | ICD-10-CM | POA: Diagnosis not present

## 2024-01-07 MED ORDER — OXYCODONE-ACETAMINOPHEN 7.5-325 MG PO TABS: 1.0000 | ORAL_TABLET | Freq: Three times a day (TID) | ORAL | 0 refills | Status: DC | PRN

## 2024-01-07 NOTE — Progress Notes (Signed)
 Subjective:    Patient ID: Donna Rogers, female    DOB: May 07, 1951, 73 y.o.   MRN: [id number]  HPI   HPI on 06/05/22 Donna Rogers is a 73 yo female with PMH of rheumatoid arthritis, chronic low back pain, diabetes mellitus type 2 with polyneuropathy, GERD, carpal tunnel right wrist, depression who is here for chronic pain.  Patient reports that she has been having burning and freezing pain in her feet that has been worsening over the past few years.  She says she feels like her feet are on ice.  Pain is worse on the dorsal and plantar aspects of her feet however she also has this pain over her ankles.  She was seen in the past by Dr. Rexanne Catalina for lumbar spine arthritis and reports that she had epidural steroid injections x2.  She reports the first injection helped a little bit but the second injection did not provide any relief.  She continues to have sharp and aching pain in her lower back and sometimes pain shoots into her thighs and legs.  Back pain is worsened with walking or activities.  Additionally she has severe pain in her right shoulder.  She reports shoulder injection did not help the pain.  Shoulder pain is worsened with movement.  She additionally has pain in both of her hips hands, knees.  Pain makes it difficult to sleep.  She is followed by rheumatology by Dr. Kirtland Perfect and Vinita Greenspan and is currently treated with methotrexate.  She says that she was was previously on Enbrel.   Prior treatments Tylenol  and ibuprofen in past helped slightly Diclofenac  oral did not help Blu Emu cream- helps a little Gabapentin  1200mg  BID to TID with mild to moderate improvement She has not tried Lyrica  Hydrocodone  helped in the past, it stopped working  She has not tried oxycodone  Tizanidine  did not help   Interval History Donna Rogers is here for follow-up due to her chronic pain.  She reports that her polyneuropathy is much improved since starting Lyrica .  She feels that this  works much better than the gabapentin .  She says she would like to try Qutenza however cost is prohibitive at this time.  She continues to have lower back pain and shoulder pain bilaterally that limits her activities.  The lower back pain did not improve significantly with the change to Lyrica .  She has not had any side effects with this medication.  She reports she previously tried tramadol  and it initially helped mildly but later did not provide significant benefit.  She will occasionally get a tingling sensation in the fourth and fifth digits on both hands but this is not present currently.  She feels like she has poor balance overall however has not had any recent falls.  She does not use a cane for ambulation at this time but she has been home.     Interval History 10/01/22 Donna Rogers is here for follow-up regarding her chronic pain.  She reports that oxycodone  is helping to control her lower back pain.  She uses this medication sparingly only when the pain is very severe.  The medication allows her to do more active activities and tasks at her house.  She continues to take Lyrica  for burning sensation in her feet.  She does feel like the medication is not helping as much as it did when we spoke at her last visit.  Lyrica  does not currently give her the drunk feeling she was getting  when she is taking gabapentin .  She denies any history of CKD or known history of kidney dysfunction.  She continues to have right shoulder pain.  She says that she would like to repeat the right shoulder injection.  She previously had this completed by her orthopedic doctor.  She asked if we can completed at her next visit if possible.     Interval History 4/1 Donna Rogers is here regarding her chronic pain and for a right shoulder injection.  She reports that oxycodone  has been providing benefit to her pain however she feels like the 5 mg dose is not strong enough.  Previously when she tried 2 tablets of the oxycodone  5  mg this provided better relief for her pain.  She continues to have back pain that is sometimes shooting down her right leg.  She continues to have pain in her right shoulder that is limiting.  She does not have any side effects with these are working well for her and not.     Interval history 02/05/2023 Donna Rogers is here for follow-up regarding her chronic low back pain, shoulder pain and polyneuropathy.  She reports her shoulder is doing much better since we did the shoulder injection last visit.  Oxycodone  7.5 3 times daily is helping her pain stay very controlled.  She continues to have a lot of lumbar back pain however it is more tolerable with this medication.  For the past few months she has been using Lyrica  150 mg twice daily noted on PDMP review.  In February and March she was getting 200 mg of Lyrica  twice daily however I think she may have reverted back to an older prescription and started taking up to 150 mg tabs from the pharmacy.  She is unsure what kind of difference is made to her pain.  She does continue to have pain in her feet that has been attributed to polyneuropathy.     Interval history 03/12/2023 Donna Rogers is here for follow-up regarding her chronic lower back pain, right shoulder pain and polyneuropathy.  Patient feels that her pain is doing better overall since her last visit.  She does ask when she can do her repeat shoulder injection as the pain is gradually coming back.  She does not feel like she needs an injection today because pain is still overall controlled.  She reports current medications are working well for her and not causing any significant side effects.  Patient reports she is using Percocet sparingly and this appears consistent with her pill count.  Interval history 05/13/2023 Donna Rogers is here for follow-up regarding her chronic lower back pain, right shoulder pain and polyneuropathy.  Patient reports that she continues to have pain in these areas however it is  doing better than it was previously.  She has not needed to use this many tabs of oxycodone  and still has 90 tabs left today.  She reports her shoulder pain is under control as well.  She does have numbness and pain in her feet, she reports a cold sensation in her feet.  When she does need oxycodone  she reports it is helping her pain.   Interval history 08/25/22 Her [place] Dar is tomorrow.  She was recently started on CPAP for OSA. She continues to have pain in her lower back. Oxycodone  helps her pain, she uses sparingly only when pain is severe.  Pain is also coming back in R shoulder.   She also has pain from polyneuropathy in her b/l feet.  Lyrica  is helping but not keeping it under control.    Interval history 09/26/22 Pt continues to have severe pain in her feet. This visit was scheduled as f/u visit instead of Qutenza. She is still interested in Qutenza treatment. She also has pain in her lower back. Oxycodone  continues to help her pain, still using it only when pain is very severe. She continues to have R shoulder pain.   Interval history 11/08/23 Patient is here for right shoulder injection, reports good benefit last time this was completed.  She also continues to have lower back pain, MRI completed but results pending.  Oxycodone  helping keep her pain under control, she only uses intermittently when pain is very severe.  No side effects with the medication.  Qutenza treatment was declined by insurance, needs to try lidocaine  patches first.  Interval history 01/07/2024 Patient reports continued back pain, limiting her activities.  Unfortunately shoulder injection last visit did not help as much as it did the first time.  She would like to try a repeat injection to see if this will help again.  She had an MRI of her lumbar spine completed which showed multilevel moderate to severe bilateral facet arthropathy, moderate spinal canal stenosis L1-2, L2-3 and L3-4 and multilevel foraminal stenosis.  Her  pain is primarily axial located in her bilateral lower L-spine.  Her back pain is improved with bending forward, worsens when she gets back up.  She denies pain radiating into her legs and feet. She reports continued benefit with oxycodone , no significant side effects reported.  Pain Inventory Average Pain 8 Pain Right Now 8 My pain is sharp and aching  In the last 24 hours, has pain interfered with the following? General activity 7 Relation with others 10 Enjoyment of life 8 What TIME of day is your pain at its worst? morning , daytime, evening, and night Sleep (in general) Poor  Pain is worse with: walking, inactivity, and standing Pain improves with: rest and medication Relief from Meds: 7  Family History  Problem Relation Age of Onset   Alzheimer's disease Mother    Lung cancer Mother 87   Diabetes Father    Heart disease Father    Arthritis Sister    Diabetes Sister    Diabetes Brother    Hypertension Brother    Colon cancer Neg Hx    Stomach cancer Neg Hx    Colon polyps Neg Hx    Esophageal cancer Neg Hx    Rectal cancer Neg Hx    Social History   Socioeconomic History   Marital status: Significant Other    Spouse name: Not on file   Number of children: 1   Years of education: 11th   Highest education level: 11th grade  Occupational History   Occupation: retired  Tobacco Use   Smoking status: Former    Current packs/day: 1.00    Average packs/day: 1 pack/day for 46.0 years (46.0 ttl pk-yrs)    Types: Cigarettes   Smokeless tobacco: Never  Vaping Use   Vaping status: Some Days  Substance and Sexual Activity   Alcohol use: No    Alcohol/week: 0.0 standard drinks of alcohol   Drug use: No   Sexual activity: Not Currently  Other Topics Concern   Not on file  Social History Narrative   The patient is divorced she has 1 son who is married that she has a grandchild.   She does not use alcohol or drugs she drinks 3-4 caffeinated  beverages daily   She is a  smoker   She lives with her boyfriend   Social Drivers of Corporate investment banker Strain: Low Risk  (04/20/2023)   Overall Financial Resource Strain (CARDIA)    Difficulty of Paying Living Expenses: Not hard at all  Food Insecurity: No Food Insecurity (04/20/2023)   Hunger Vital Sign    Worried About Running Out of Food in the Last Year: Never true    Ran Out of Food in the Last Year: Never true  Transportation Needs: No Transportation Needs (04/20/2023)   PRAPARE - Administrator, Civil Service (Medical): No    Lack of Transportation (Non-Medical): No  Physical Activity: Inactive (04/20/2023)   Exercise Vital Sign    Days of Exercise per Week: 0 days    Minutes of Exercise per Session: 0 min  Stress: No Stress Concern Present (04/20/2023)   [employer] of Occupational Health - Occupational Stress Questionnaire    Feeling of Stress : Not at all  Social Connections: Moderately Isolated (04/20/2023)   Social Connection and Isolation Panel [NHANES]    Frequency of Communication with Friends and Family: More than three times a week    Frequency of Social Gatherings with Friends and Family: More than three times a week    Attends Religious Services: Never    Database administrator or Organizations: No    Attends Banker Meetings: Never    Marital Status: Living with partner   Past Surgical History:  Procedure Laterality Date   BREAST LUMPECTOMY Right 2018   BREAST LUMPECTOMY WITH RADIOACTIVE SEED LOCALIZATION Left 03/03/2016   Procedure: BREAST LUMPECTOMY WITH RADIOACTIVE SEED LOCALIZATION;  Surgeon: Oza Blumenthal, MD;  Location: [facility] OR;  Service: General;  Laterality: Left;   COLONOSCOPY  08/2002   RMR: internal hemorrhoids   COLONOSCOPY N/A 10/21/2015   Procedure: COLONOSCOPY;  Surgeon: Suzette Espy, MD;  Location: [facility];  Service: Endoscopy;  Laterality: N/A;  250 - moved to 2:35 - office to notify pt   CYST REMOVAL TRUNK      ESOPHAGOGASTRODUODENOSCOPY  08/2002   RMR: nonerosive reflux esophagitis, hiatal hernia   TONSILLECTOMY     WISDOM TOOTH EXTRACTION     Past Surgical History:  Procedure Laterality Date   BREAST LUMPECTOMY Right 2018   BREAST LUMPECTOMY WITH RADIOACTIVE SEED LOCALIZATION Left 03/03/2016   Procedure: BREAST LUMPECTOMY WITH RADIOACTIVE SEED LOCALIZATION;  Surgeon: Oza Blumenthal, MD;  Location: [facility] OR;  Service: General;  Laterality: Left;   COLONOSCOPY  08/2002   RMR: internal hemorrhoids   COLONOSCOPY N/A 10/21/2015   Procedure: COLONOSCOPY;  Surgeon: Suzette Espy, MD;  Location: [facility];  Service: Endoscopy;  Laterality: N/A;  250 - moved to 2:35 - office to notify pt   CYST REMOVAL TRUNK     ESOPHAGOGASTRODUODENOSCOPY  08/2002   RMR: nonerosive reflux esophagitis, hiatal hernia   TONSILLECTOMY     WISDOM TOOTH EXTRACTION     Past Medical History:  Diagnosis Date   Allergy    seasonal allergies   Anemia    hx of   Arthritis    rheumatoid   Depression    on meds   Diabetes (HCC)    Diabetic peripheral neuropathy (HCC)    GERD (gastroesophageal reflux disease)    on meds   Headache    Hyperlipidemia    on meds   Hyperplastic rectal polyp 10/21/2015   Hypertension  pt. denies   Lymphocytic colitis    Neuropathy    Rheumatoid arthritis (HCC)    Shortness of breath dyspnea    with exertion   BP 131/84   Pulse 72   Wt 206 lb (93.4 kg)   SpO2 95%   BMI 38.92 kg/m   Opioid Risk Score:   Fall Risk Score:  `1  Depression screen PHQ 2/9     01/07/2024    1:21 PM 01/03/2024    3:17 PM 11/08/2023    1:20 PM 08/26/2023    1:00 PM 07/28/2023    2:33 PM 07/28/2023    2:32 PM 05/13/2023    1:54 PM  Depression screen PHQ 2/9  Decreased Interest 1 0 0 0 1 0 0  Down, Depressed, Hopeless 1 1 0 0 2 0 0  PHQ - 2 Score 2 1 0 0 3 0 0  Altered sleeping  3   3 0   Tired, decreased energy  3   3 0   Change in appetite  0   0 0   Feeling bad or failure about yourself   3    3 0   Trouble concentrating  0   1 0   Moving slowly or fidgety/restless  3   3 0   Suicidal thoughts  0   0 0   PHQ-9 Score  13   16 0   Difficult doing work/chores  Somewhat difficult   Very difficult Not difficult at all       Review of Systems  Musculoskeletal:  Positive for back pain and gait problem.  All other systems reviewed and are negative.      Objective:   Physical Exam   Physical Exam Gen: no distress, normal appearing HEENT: oral mucosa pink and moist, NCAT Chest: normal effort, normal rate of breathing Abd: soft, non-distended Ext: no edema Psych: pleasant, appropriate Skin: intact Neuro: CN 2-12 grossly intact, follows commands No focal motor deficits noted Decreased sensation to LT in b/l LE below the knee  Musculoskeletal: R shoulder pain with abduction and internal rotation  TTP throughout bilateral L-spine but worse in lower L-spine Facet loading mildly positive today Ambulates with a cane    Lumbar xray 08/26/17 FINDINGS: Five lumbar type vertebral bodies are well visualized. Vertebral body height is well maintained. Multilevel disc space narrowing is noted throughout the lumbar spine progressed in the interval from the prior exam. Osteophytic changes are noted as well. No anterolisthesis is seen. A somewhat rounded calcification is noted in the right upper quadrant which may be related to cholelithiasis. Clinical correlation is recommended.   IMPRESSION: Multilevel degenerative changes progressed when compared with the prior exam of 20/8.   Rounded calcification in the right upper quadrant may represent cholelithiasis.       MRI shoulder 08/22/21 IMPRESSION: 1. Full-thickness, full width tears of the supraspinatus and infraspinatus tendons with retraction to the glenoid. 2. Full-thickness subscapularis tear involving the mid to superior fibers with tendon gap up to 1.0 cm, and interstitial extension proximally. 3. Grade 2 muscle  atrophy of the supraspinatus, infraspinatus, and subscapularis muscles. 4. Moderate glenohumeral and acromioclavicular joint osteoarthritis. 5. Likely torn and retracted long head biceps tendon. 6. Degenerative posterosuperior labral tearing.    L spine MRI 11/01/23       Assessment & Plan:     Chronic lower back pain.  Pain is primarily axial. -Patient moderate lumbar spinal stenosis, multilevel facet joint and multilevel neuroforaminal stenosis. -Discussed  referral to neurosurgery-she is not interested in surgery at this time would like to hold off for now -UDS and pain agreement completed prior visit -Continue oxycodone  7.5mg  Q8h prn #90-refilled  -Continue UDS and pill counts.  Continue PDMP monitoring.  Pain contract completed prior visit.  -Continue use of TENS -List of Home exercises for back pain provided again today.  Advised to try to do 3 times a weeks. She has not completed 6 weeks of this exercise yet. - Pain is primarily axial at this time in her lower lumbar spine, consider MBB/RFA for lower lumbar spine     Right shoulder pain with biceps SS, IS and Subscapularis tears, GH and AC arthritis  -Continue medications as above -Voltaren  gel as needed - Patient reports last shoulder injection did not help as much as the first did.  She would like to try repeat shoulder injection.  Will schedule for shoulder injection next visit, at least 3 months from prior injection  DM with polyneuropathy --Discussed Qutenza as an option for neuropathic pain control. Discussed that this is a capsaicin patch, stronger than capsaicin cream. Discussed that it is currently approved for diabetic peripheral neuropathy and post-herpetic neuralgia, but that it has also shown benefit in treating other forms of neuropathy. Provided patient with link to site to learn more about the patch: https://www.clark.biz/. Discussed that the patch would be placed in office and benefits usually last 3 months.  Discussed that unintended exposure to capsaicin can cause severe irritation of eyes, mucous membranes, respiratory tract, and skin, but that Qutenza is a local treatment and does not have the systemic side effects of other nerve medications. Discussed that there may be pain, itching, erythema, and decreased sensory function associated with the application of Qutenza. Side effects usually subside within 1 week. A cold pack of analgesic medications can help with these side effects. Blood pressure can also be increased due to pain associated with administration of the patch. -Cont Lyrica  to 200mg  BID -Qutenza was declined by insurance.  Intermittent numbness in her fourth and fifth digit, Possibly some ulnar nerve irritation,  Continue to monitor.   Rheumatoid arthritis and polyarthralgia history -Continue f/u with rheumatology as directed

## 2024-01-11 ENCOUNTER — Other Ambulatory Visit: Payer: Self-pay | Admitting: Family Medicine

## 2024-01-11 DIAGNOSIS — E1142 Type 2 diabetes mellitus with diabetic polyneuropathy: Secondary | ICD-10-CM

## 2024-01-11 LAB — DRUG TOX MONITOR 1 W/CONF, ORAL FLD
Amphetamines: NEGATIVE ng/mL (ref <10)
Barbiturates: NEGATIVE ng/mL (ref <10)
Benzodiazepines: NEGATIVE ng/mL (ref <0.50)
Buprenorphine: NEGATIVE ng/mL (ref <0.10)
Cocaine: NEGATIVE ng/mL (ref <5.0)
Codeine: NEGATIVE ng/mL (ref <2.5)
Cotinine: 27.5 ng/mL — ABNORMAL HIGH (ref <5.0)
Dihydrocodeine: NEGATIVE ng/mL (ref <2.5)
Fentanyl: NEGATIVE ng/mL (ref <0.10)
Heroin Metabolite: NEGATIVE ng/mL (ref <1.0)
Hydrocodone: NEGATIVE ng/mL (ref <2.5)
Hydromorphone: NEGATIVE ng/mL (ref <2.5)
MARIJUANA: NEGATIVE ng/mL (ref <2.5)
MDMA: NEGATIVE ng/mL (ref <10)
Meprobamate: NEGATIVE ng/mL (ref <2.5)
Methadone: NEGATIVE ng/mL (ref <5.0)
Morphine: NEGATIVE ng/mL (ref <2.5)
Nicotine Metabolite: POSITIVE ng/mL — AB (ref <5.0)
Norhydrocodone: NEGATIVE ng/mL (ref <2.5)
Noroxycodone: 3.9 ng/mL — ABNORMAL HIGH (ref <2.5)
Opiates: POSITIVE ng/mL — AB (ref <2.5)
Oxycodone: 2.7 ng/mL — ABNORMAL HIGH (ref <2.5)
Oxymorphone: NEGATIVE ng/mL (ref <2.5)
Phencyclidine: NEGATIVE ng/mL (ref <10)
Tapentadol: NEGATIVE ng/mL (ref <5.0)
Tramadol: NEGATIVE ng/mL (ref <5.0)
Zolpidem: NEGATIVE ng/mL (ref <5.0)

## 2024-01-11 LAB — DRUG TOX ALC METAB W/CON, ORAL FLD: Alcohol Metabolite: NEGATIVE ng/mL (ref <25)

## 2024-01-13 ENCOUNTER — Telehealth: Payer: Self-pay

## 2024-01-13 NOTE — Progress Notes (Signed)
 Complex Care Management Care Guide Note  01/13/2024 Name: [name] MRN: [id number] DOB: 1951-03-10  Donna Rogers is a 73 y.o. year old female who is a primary care patient of Eliodoro Guerin, DO and is actively engaged with the care management team. I reached out to [name] by phone today to assist with re-scheduling  with the Pharmacist.  Follow up plan: Unsuccessful telephone outreach attempt made. A HIPAA compliant phone message was left for the patient providing contact information and requesting a return call.  Lenton Rail , RMA     [facility]  [facility], [facility] Guide  Direct Dial: [phone]  Website: [email or web address]

## 2024-01-14 ENCOUNTER — Other Ambulatory Visit

## 2024-01-14 ENCOUNTER — Telehealth: Payer: Self-pay | Admitting: Family Medicine

## 2024-01-14 DIAGNOSIS — Z794 Long term (current) use of insulin: Secondary | ICD-10-CM

## 2024-01-14 DIAGNOSIS — Z7985 Long-term (current) use of injectable non-insulin antidiabetic drugs: Secondary | ICD-10-CM

## 2024-01-14 DIAGNOSIS — E119 Type 2 diabetes mellitus without complications: Secondary | ICD-10-CM

## 2024-01-14 NOTE — Progress Notes (Signed)
 01/14/2024 Name: Donna Rogers MRN: [id number] DOB: 10-03-1950  Chief Complaint  Patient presents with   Diabetes    Donna Rogers is a 73 y.o. year old female who presented for a telephone visit.  I connected with  Donna Rogers on 01/14/24 by telephone and verified that I am speaking with the correct person using two identifiers. I discussed the limitations of evaluation and management by telemedicine. The patient expressed understanding and agreed to proceed.  Patient was located in her home and PharmD in PCP office during this visit.   They were referred to the pharmacist by their PCP for assistance in managing diabetes.    Subjective:  Patient living with T2D and current memory struggles/dementia reports she is not doing well.  She states her blood sugar is always "running high" and it doesn't come down.  She reports her FBG blood was 353 this morning.  The lowest FBG she can recall was FBG 291.  Care Team: Primary Care Provider: Eliodoro Guerin, Rogers ; Next Scheduled Visit: 8/22/205   Medication Access/Adherence  Current Pharmacy:  [facility] [place], [place] - [address] [place] [address] Phone: [phone] Fax: [phone]  [facility] - [place], [place] - [address] [place] [address] [place] [address] Phone: [phone] Fax: [phone]  [id number] - [place], [place] - [address] [place] [address] Phone: [phone] Fax: [phone]  Patient reports affordability concerns with their medications: Yes  Patient reports access/transportation concerns to their pharmacy: No  Patient reports adherence concerns with their medications:  Yes  -- struggling with dementia   Diabetes:  Current medications: Tresiba  28 units nightly-->increased to 32 units nightly today, Ozempic  2mg  weekly, glipizide  Medications tried in the past: dapagliflozin  (Farxiga )--d/c'd  due to recurrent yeast infections, metformin  d/c'd due to GI distress  Current glucose readings: FBG 200-300s States she is running high always No recent reported hypoglycemia  T2D testing status/history: She has tried FSL CGM in the past with reader, however did not want to add to things to remember/stated it was inaccurate Using traditional glucometer at home; does test and record readings daily  Patient denies hypoglycemic s/sx including dizziness, shakiness, sweating. Patient reports hyperglycemic symptoms including polyuria, polydipsia, polyphagia, nocturia, neuropathy, blurred vision.  Current meal patterns:  Discussed meal planning options and Plate method for healthy eating Avoid sugary drinks and desserts Incorporate balanced protein, non starchy veggies, 1 serving of carbohydrate with each meal Increase water  intake Increase physical activity as able  Current physical activity: limited; encourages as able  Current medication access support: enrolled in Novo Nordisk PAP until December 2025 (Ozempic  and Tresiba )   Objective:  Lab Results  Component Value Date   HGBA1C 12.4 (H) 01/03/2024    Lab Results  Component Value Date   CREATININE 0.85 07/28/2023   BUN 10 07/28/2023   NA 140 07/28/2023   K 4.1 07/28/2023   CL 97 07/28/2023   CO2 28 07/28/2023    Lab Results  Component Value Date   CHOL 141 03/05/2023   HDL 31 (L) 03/05/2023   LDLCALC 75 03/05/2023   TRIG 205 (H) 03/05/2023   CHOLHDL 4.5 (H) 03/05/2023    Medications Reviewed Today     Reviewed by Donna Rogers, Donna Rogers (Pharmacist) on 01/14/24 at 1420  Med List Status: <None>   Medication Order Taking? Sig Documenting Provider Last  Dose Status Informant  Accu-Chek Softclix Lancets lancets [id number] No USE TO check blood sugar TWICE DAILY [provider] Taking Active   Alpha-Lipoic Acid 600 MG CAPS [id number]  TAKE (1) CAPSULE DAILY FOR NERVE PAIN Donna Rogers Rogers, Rogers  Active   Ascorbic  Acid (VITAMIN C) 500 MG CHEW [id number] No 1 tablet [provider] Taking Active   aspirin 81 MG tablet [id number] No Take 81 mg by mouth daily. [provider] Taking Active Self  atenolol  (TENORMIN ) 25 MG tablet [id number] No Take 1 tablet (25 mg total) by mouth daily. Donna Rogers Rogers, Rogers Taking Active   blood glucose meter kit and supplies [id number] No Dispense based on patient and insurance preference. Use up to four times daily as directed. (FOR ICD-10 E10.9, E11.9). Donna Rogers Rogers, Rogers Taking Active   buPROPion  (WELLBUTRIN  XL) 300 MG 24 hr tablet [id number] No Take 1 tablet (300 mg total) by mouth daily. Donna Rogers Rogers, Rogers Taking Active   busPIRone  (BUSPAR ) 10 MG tablet [id number] No TAKE (1) TABLET TWICE A DAY. Donna Rogers Rogers, Rogers Taking Active   cholecalciferol (VITAMIN D ) 1000 UNITS tablet [id number] No Take 1,000 Units by mouth daily. [provider] Taking Active Self  citalopram  (CELEXA ) 40 MG tablet [id number] No TAKE ONE TABLET ONCE DAILY Donna Rogers Taking Active   Cyanocobalamin (VITAMIN B 12 PO) [id number] No Take by mouth. [provider] Taking Active   diclofenac  Sodium (VOLTAREN  ARTHRITIS PAIN) 1 % GEL [id number] No Apply 2 g topically 4 (four) times daily. Donna Sand, MD Taking Active   folic acid  (FOLVITE ) 1 MG tablet [id number] No Take 1 mg by mouth daily. [provider] Taking Active   glipiZIDE  (GLUCOTROL  XL) 5 MG 24 hr tablet [id number] No Take 1 tablet (5 mg total) by mouth 2 (two) times daily with a meal. Donna Rogers Rogers, Rogers Taking Active   glucose blood (ACCU-CHEK GUIDE TEST) test strip [id number] No Check sugar 2 times daily E11.9 Donna Rogers, Donna Rogers, Rogers Taking Active   insulin  degludec (TRESIBA  FLEXTOUCH) 100 UNIT/ML FlexTouch Pen [id number] No Inject 20-30 Units into the skin at bedtime. Dose change Donna Rogers Rogers, Rogers Taking Active   Insulin  Pen Needle (NOVOFINE PLUS PEN NEEDLE) 32G X 4 MM  MISC [id number] No UAD to inject insulin . E11.65 Donna Rogers Taking Active   Lancet Device MISC [id number] No Check sugar 2 times daily E11.9 Donna Rogers Rogers, Rogers Taking Active   lidocaine  4 % [id number] No Place 2 patches onto the skin daily. Donna Sand, MD Taking Active   losartan  (COZAAR ) 100 MG tablet [id number] No Take 1 tablet (100 mg total) by mouth daily. Donna Rogers Rogers, Rogers Taking Active   methotrexate (RHEUMATREX) 2.5 MG tablet [id number] No 6 tablets Orally once weekly on the same day for 90 days [provider] Taking Active   omeprazole  (PRILOSEC) 40 MG capsule [id number] No Take 1 capsule (40 mg total) by mouth daily. Donna Rogers Rogers, Rogers Taking Active   oxyCODONE -acetaminophen  (PERCOCET) 7.5-325 MG tablet [id number]  Take 1 tablet by mouth every 8 (eight) hours as needed for severe pain (pain score 7-10). Donna Sand, MD  Active   pregabalin  (LYRICA ) 200 MG capsule [id number] No TAKE ONE CAPSULE BY MOUTH TWICE DAILY Donna Sand, MD Taking Active   Pyridoxine HCl (VITAMIN B-6 PO) [id number] No Take by mouth. [provider] Taking Active   Semaglutide , 2 MG/DOSE, 8 MG/3ML SOPN [id number] No Inject 2 mg as  directed once a week. Via novo nordisk patient assistance Donna Rogers Rogers, Rogers Taking Active   simvastatin  (ZOCOR ) 40 MG tablet [id number] No Take 1 tablet (40 mg total) by mouth daily. Donna Rogers Rogers, Rogers Taking Active   sulfaSALAzine (AZULFIDINE) 500 MG tablet [id number] No Take 500 mg by mouth 2 (two) times daily. [provider] Taking Active   tiZANidine  (ZANAFLEX ) 4 MG tablet [id number] No Take 0.5-1 tablets (2-4 mg total) by mouth every 8 (eight) hours as needed for muscle spasms. Donna Rogers Taking Active             Assessment/Plan:   Diabetes: - Currently uncontrolled; Patient and medication regimen/therapy are limited due to memory status/current dementia.  Patient states she self-injects,  however her friend does help her check her blood sugar and can assist. - Reviewed long term cardiovascular and renal outcomes of uncontrolled blood sugar - Reviewed goal A1c, goal fasting, and goal 2 hour post prandial glucose - Reviewed dietary modifications including FOLLOWING A HEART HEALTHY DIET/HEALTHY PLATE METHOD - Recommend to : INCREASE Tresiba  to 32 units nightly Continue Ozempic  2mg  weekly (tolerating well) Continue glipizide  (unsure if she is receiving any benefit, may consider d/c if adding to pill burden and we add meal time; gauge patient ability) Consider meal time insulin  based on ability to administer safely at home (concerns for hypoglycemia) Consider retrial of FSL CGM if patient amenable (she should have FSL3 reader at home Print medication instructions at next PharmD clinic in person appt for patient to have for her refrigerator as daily reminder for her T2D medications as they will continue to change  - Patient denies personal or family history of multiple endocrine neoplasia type 2, medullary thyroid  cancer; personal history of pancreatitis or gallbladder disease. - Recommend to check glucose daily (fasting) or if symptomatic - Novo Nordisk Patient assistance medications are in the [facility] lab fridge for pick up and will give to patient at 01/18/24 PharmD appt -LDL 75 continue current regimen (filling this year 90DS) -address hypertension at upcoming visit -follow up chronic labs needed at PCP appt in August   Follow Up Plan: PharmD clinic 01/18/24  Marvell Slider, PharmD, BCACP, CPP Clinical Pharmacist, [facility]

## 2024-01-14 NOTE — Telephone Encounter (Signed)
 Tried calling patient to make her aware that we have her Insulin  order that is ready to be picked up asap. Patient is scheduled to be seen at our office next week (01/18/24). Needs to pick up at that visit, if not before.

## 2024-01-17 NOTE — Progress Notes (Signed)
 01/18/2024 Name: Donna Rogers MRN: [id number] DOB: 06-27-1951  Chief Complaint  Patient presents with   Diabetes    Donna Rogers is a 73 y.o. year old female who was referred for medication management by their primary care provider, Vicky Grange M, DO. They presented for a face to face visit today.   They were referred to the pharmacist by their PCP for assistance in managing diabetes   Care Team: Primary Care Provider: Eliodoro Guerin, DO ; Next Scheduled Visit: 04/07/24  Medication Access/Adherence  Current Pharmacy:  [facility] [place], [place] - [address] [place] [address] Phone: [phone] Fax: [phone]  [facility] - [place], [place] - [address] [place] [address] [place] [address] Phone: [phone] Fax: [phone]  [id number] - [place], [place] - [address] [place] [address] Phone: [phone] Fax: [phone]   Patient reports affordability concerns with their medications: No  Patient reports access/transportation concerns to their pharmacy: No  Patient reports adherence concerns with their medications:  Yes  - says she will forget to take her insulin  about twice a week and sometimes will take Ozempic  a day or two late because she forgets   Diabetes:  Current medications: Tresiba  32 units daily (night), Ozempic  2 mg weekly, glipizide  XL 5 mg twice daily with meals Medications tried in the past: dapagliflozin  (Farxiga )--d/c'd due to recurrent yeast infections, metformin  d/c'd due to GI distress   Patient presents to clinic today accompanied by her friend, Alvie Jolly who helps with her medications by filling a pill box for her. Reports that since insulin  was increased last Friday she has not missed any doses. Fasting BG readings are now consistently ~230s. She does not check her BG any other times of the day. Previously she tried [employer] CGM, but did not  like it as it fell off her arm and she felt like it was not reading accurately.   Using glucometer; testing once daily AM Fasting BG: 230s  Patient denies hypoglycemic s/sx including dizziness, shakiness, sweating. Patient endorses polydipsia and blurry vision.  Current meal patterns:  - Endorses appetite suppression with Ozempic  - Supper: tomato sandwich, egg salad sandwich, chicken salad sandwich - Drinks: water , dr pepper zero, black coffee  Current physical activity: encouraged as able  Current medication access support: enrolled in Novo Nordisk PAP until December 2025 (Ozempic  and Tresiba )    Objective:  Lab Results  Component Value Date   HGBA1C 12.4 (H) 01/03/2024    Lab Results  Component Value Date   CREATININE 0.85 07/28/2023   BUN 10 07/28/2023   NA 140 07/28/2023   K 4.1 07/28/2023   CL 97 07/28/2023   CO2 28 07/28/2023    Lab Results  Component Value Date   CHOL 141 03/05/2023   HDL 31 (L) 03/05/2023   LDLCALC 75 03/05/2023   TRIG 205 (H) 03/05/2023   CHOLHDL 4.5 (H) 03/05/2023    Medications Reviewed Today     Reviewed by Philmore Bream, RPH (Pharmacist) on 01/18/24 at 1437  Med List Status: <None>   Medication Order Taking? Sig Documenting Provider Last Dose Status Informant  Accu-Chek Softclix Lancets lancets [id number]  USE TO check blood sugar TWICE DAILY [provider]  Active   Alpha-Lipoic Acid 600 MG CAPS [id number]  TAKE (1) CAPSULE DAILY FOR NERVE PAIN Eliodoro Guerin, DO  Active  Ascorbic Acid (VITAMIN C) 500 MG CHEW [id number]  1 tablet [provider]  Active   aspirin 81 MG tablet [id number]  Take 81 mg by mouth daily. [provider]  Active Self  atenolol  (TENORMIN ) 25 MG tablet [id number]  Take 1 tablet (25 mg total) by mouth daily. Vicky Grange M, DO  Active   blood glucose meter kit and supplies [id number]  Dispense based on patient and insurance preference. Use up to four times daily as directed.  (FOR ICD-10 E10.9, E11.9). Vicky Grange M, DO  Active   buPROPion  (WELLBUTRIN  XL) 300 MG 24 hr tablet [id number]  Take 1 tablet (300 mg total) by mouth daily. Vicky Grange M, DO  Active   busPIRone  (BUSPAR ) 10 MG tablet [id number]  TAKE (1) TABLET TWICE A DAY. Vicky Grange M, DO  Active   cholecalciferol (VITAMIN D ) 1000 UNITS tablet [id number]  Take 1,000 Units by mouth daily. [provider]  Active Self  citalopram  (CELEXA ) 40 MG tablet [id number]  TAKE ONE TABLET ONCE DAILY Vicky Grange M, DO  Active   Cyanocobalamin (VITAMIN B 12 PO) [id number]  Take by mouth. [provider]  Active   diclofenac  Sodium (VOLTAREN  ARTHRITIS PAIN) 1 % GEL [id number]  Apply 2 g topically 4 (four) times daily. Lylia Sand, MD  Active   folic acid  (FOLVITE ) 1 MG tablet [id number]  Take 1 mg by mouth daily. [provider]  Active   glipiZIDE  (GLUCOTROL  XL) 5 MG 24 hr tablet [id number] Yes Take 1 tablet (5 mg total) by mouth 2 (two) times daily with a meal. Vicky Grange M, DO Taking Active   glucose blood (ACCU-CHEK GUIDE TEST) test strip [id number]  Check sugar 2 times daily E11.9 Gottschalk, Ashly M, DO  Active   insulin  degludec (TRESIBA  FLEXTOUCH) 100 UNIT/ML FlexTouch Pen [id number]  Inject 20-30 Units into the skin at bedtime. Dose change  Patient taking differently: Inject 32 Units into the skin at bedtime. Dose change   Vicky Grange M, DO  Active            Med Note Alpheus Arvin, Donata Fryer   Fri Jan 14, 2024  2:27 PM) Via novo nordisk patient assistance program    Insulin  Pen Needle (NOVOFINE PLUS PEN NEEDLE) 32G X 4 MM MISC [id number]  UAD to inject insulin . E11.65 Eliodoro Guerin, DO  Active   Lancet Device MISC [id number]  Check sugar 2 times daily E11.9 Vicky Grange M, DO  Active   lidocaine  4 % [id number]  Place 2 patches onto the skin daily. Lylia Sand, MD  Active   losartan  (COZAAR ) 100 MG tablet [id number]  Take 1 tablet (100 mg total) by  mouth daily. Vicky Grange M, DO  Active   methotrexate (RHEUMATREX) 2.5 MG tablet [id number]  6 tablets Orally once weekly on the same day for 90 days [provider]  Active   omeprazole  (PRILOSEC) 40 MG capsule [id number]  Take 1 capsule (40 mg total) by mouth daily. Vicky Grange M, DO  Active   oxyCODONE -acetaminophen  (PERCOCET) 7.5-325 MG tablet [id number]  Take 1 tablet by mouth every 8 (eight) hours as needed for severe pain (pain score 7-10). Lylia Sand, MD  Active   pregabalin  (LYRICA ) 200 MG capsule [id number]  TAKE ONE CAPSULE BY MOUTH TWICE DAILY Lylia Sand, MD  Active   Pyridoxine HCl (VITAMIN B-6 PO) [id number]  Take by mouth. [provider]  Active   Semaglutide , 2 MG/DOSE, 8 MG/3ML SOPN [id number] Yes Inject 2  mg as directed once a week. Via novo nordisk patient assistance Vicky Grange M, DO Taking Active   simvastatin  (ZOCOR ) 40 MG tablet [id number]  Take 1 tablet (40 mg total) by mouth daily. Vicky Grange M, DO  Active   sulfaSALAzine (AZULFIDINE) 500 MG tablet [id number]  Take 500 mg by mouth 2 (two) times daily. [provider]  Active   tiZANidine  (ZANAFLEX ) 4 MG tablet [id number]  Take 0.5-1 tablets (2-4 mg total) by mouth every 8 (eight) hours as needed for muscle spasms. Vicky Grange M, DO  Active               Assessment/Plan:   Diabetes: - Currently uncontrolled based on last A1C 12.4% on 01/03/24, above goal <7%. Patient reported fasting BG readings are improved from last week, but still significantly elevated above goal. Current basal insulin  dose is ~0.3 units/kg. Appropriate to increase Tresiba  again today. Suspect medication non-adherence due to memory issues is also contributing as she reported forgetting to take insulin  ~2x per week and taking Ozempic  injections late. Extensively counseled on adherence techniques today. Provided them with diabetes medication instructions sheet for her to place by her bed so  that she sees the reminder at night to take insulin  and Ozempic  on Mondays. Also recommended putting reminders to take insulin  and Ozempic  on her calendar and cross off after she injects so she can look back and verify if she has taken her medication that day. If BG remains elevated despite maximizing basal insulin  dose, will need to consider addition of prandial insulin . - Reviewed long term cardiovascular and renal outcomes of uncontrolled blood sugar - Reviewed goal A1c, goal fasting, and goal 2 hour post prandial glucose - Reviewed dietary modifications including incorporating more protein, trying to eat smaller portions throughout the day - Recommend to increase Tresiba  to 36 units nightly - Recommend to continue Ozempic  2 mg weekly - Recommend to continue glipizide  XL 5 mg BID - Patient denies personal or family history of multiple endocrine neoplasia type 2, medullary thyroid  cancer; personal history of pancreatitis or gallbladder disease. - Recommend to check fasting and 2 hr post prandial blood glucose daily. Discussed benefits of CGM, but she prefers to continue with finger stick checks and she is monitoring every day. - Approved for Ozempic  and Tresiba  PAP through Novo. Provided her with PAP supply in clinic today.    Follow Up Plan: PharmD telephone on 01/25/24 with patient and Alvie Jolly, PCP on 04/07/24  Georga Killings, PharmD PGY-1 Pharmacy Resident

## 2024-01-18 ENCOUNTER — Ambulatory Visit: Admitting: Pharmacist

## 2024-01-18 DIAGNOSIS — E119 Type 2 diabetes mellitus without complications: Secondary | ICD-10-CM

## 2024-01-18 DIAGNOSIS — Z7984 Long term (current) use of oral hypoglycemic drugs: Secondary | ICD-10-CM

## 2024-01-18 DIAGNOSIS — Z794 Long term (current) use of insulin: Secondary | ICD-10-CM

## 2024-01-18 DIAGNOSIS — Z7985 Long-term (current) use of injectable non-insulin antidiabetic drugs: Secondary | ICD-10-CM | POA: Diagnosis not present

## 2024-01-22 DIAGNOSIS — G4733 Obstructive sleep apnea (adult) (pediatric): Secondary | ICD-10-CM | POA: Diagnosis not present

## 2024-01-24 NOTE — Progress Notes (Unsigned)
   01/24/2024 Name: Donna Rogers MRN: [id number] DOB: 1951/04/01  No chief complaint on file.   {Visit Type:26650}   Subjective:  Care Team: Primary Care Provider: Eliodoro Guerin, DO ; Next Scheduled Visit: 04/07/24  Medication Access/Adherence  Current Pharmacy:  [facility] [place], [place] - [address] [place] [address] Phone: [phone] Fax: [phone]  [facility] - [place], [place] - [address] [place] [address] [place] [address] Phone: [phone] Fax: [phone]  [id number] - [place],  - [address] [place] [address] Phone: [phone] Fax: [phone]   Patient reports affordability concerns with their medications: {YES/NO:21197} Patient reports access/transportation concerns to their pharmacy: {YES/NO:21197} Patient reports adherence concerns with their medications:  {YES/NO:21197} ***   Diabetes:  Current medications: Tresiba  36 units daily, Ozempic  2 mg weekly, glipizide  XL 5 mg BID with meals Medications tried in the past: dapagliflozin  (Farxiga )--d/c'd due to recurrent yeast infections, metformin  d/c'd due to GI distress   Current glucose readings: *** Using *** meter; testing *** times daily - Previously she tried [employer] CGM, but did not like it as it fell off her arm and she felt like it was not reading accurately.    Patient {Actions; denies-reports:120008} hypoglycemic s/sx including ***dizziness, shakiness, sweating. Patient {Actions; denies-reports:120008} hyperglycemic symptoms including ***polyuria, polydipsia, polyphagia, nocturia, neuropathy, blurred vision.  Current meal patterns:  - Breakfast: *** - Lunch *** - Supper *** - Snacks *** - Drinks ***  Current physical activity: ***  Current medication access support: enrolled in Novo Nordisk PAP until December 2025 (Ozempic  and Tresiba )    Objective:  Lab Results   Component Value Date   HGBA1C 12.4 (H) 01/03/2024    Lab Results  Component Value Date   CREATININE 0.85 07/28/2023   BUN 10 07/28/2023   NA 140 07/28/2023   K 4.1 07/28/2023   CL 97 07/28/2023   CO2 28 07/28/2023    Lab Results  Component Value Date   CHOL 141 03/05/2023   HDL 31 (L) 03/05/2023   LDLCALC 75 03/05/2023   TRIG 205 (H) 03/05/2023   CHOLHDL 4.5 (H) 03/05/2023    Medications Reviewed Today   Medications were not reviewed in this encounter       Assessment/Plan:   Diabetes: - Currently uncontrolled based on last A1C 12.4%  - Reviewed long term cardiovascular and renal outcomes of uncontrolled blood sugar - Reviewed goal A1c, goal fasting, and goal 2 hour post prandial glucose - Reviewed dietary modifications including *** - Reviewed lifestyle modifications including:  - Recommend to ***  - Patient denies personal or family history of multiple endocrine neoplasia type 2, medullary thyroid  cancer; personal history of pancreatitis or gallbladder disease. - Recommend to check glucose *** - Approved for Ozempic  and Tresiba  PAP through Novo.     Follow Up Plan: ***  ***

## 2024-01-25 ENCOUNTER — Other Ambulatory Visit (INDEPENDENT_AMBULATORY_CARE_PROVIDER_SITE_OTHER): Admitting: Pharmacist

## 2024-01-25 DIAGNOSIS — Z794 Long term (current) use of insulin: Secondary | ICD-10-CM

## 2024-01-25 DIAGNOSIS — E119 Type 2 diabetes mellitus without complications: Secondary | ICD-10-CM

## 2024-01-25 DIAGNOSIS — Z7984 Long term (current) use of oral hypoglycemic drugs: Secondary | ICD-10-CM

## 2024-01-25 DIAGNOSIS — Z7985 Long-term (current) use of injectable non-insulin antidiabetic drugs: Secondary | ICD-10-CM

## 2024-01-27 ENCOUNTER — Other Ambulatory Visit: Payer: Self-pay | Admitting: Acute Care

## 2024-01-27 DIAGNOSIS — Z87891 Personal history of nicotine dependence: Secondary | ICD-10-CM

## 2024-01-27 DIAGNOSIS — Z122 Encounter for screening for malignant neoplasm of respiratory organs: Secondary | ICD-10-CM

## 2024-02-02 ENCOUNTER — Other Ambulatory Visit (INDEPENDENT_AMBULATORY_CARE_PROVIDER_SITE_OTHER)

## 2024-02-02 DIAGNOSIS — Z794 Long term (current) use of insulin: Secondary | ICD-10-CM

## 2024-02-02 DIAGNOSIS — E119 Type 2 diabetes mellitus without complications: Secondary | ICD-10-CM

## 2024-02-02 DIAGNOSIS — Z7984 Long term (current) use of oral hypoglycemic drugs: Secondary | ICD-10-CM

## 2024-02-02 DIAGNOSIS — Z7985 Long-term (current) use of injectable non-insulin antidiabetic drugs: Secondary | ICD-10-CM

## 2024-02-02 NOTE — Progress Notes (Signed)
 02/02/2024 Name: Donna Rogers MRN: [id number] DOB: 31-Dec-1950  Chief Complaint  Patient presents with   Diabetes    Donna Rogers is a 73 y.o. year old female who presented for a telephone visit. I connected with  Donna Rogers on 02/02/24 by telephone and verified that I am speaking with the correct person using two identifiers. I discussed the limitations of evaluation and management by telemedicine. The patient expressed understanding and agreed to proceed.  Patient was located in her home and PharmD in PCP office during this visit.  They were referred to the pharmacist by their PCP for assistance in managing diabetes.   Care Team: Primary Care Provider: Eliodoro Guerin, DO ; Next Scheduled Visit: 04/07/24  Medication Access/Adherence  Current Pharmacy:  [facility] [place], [place] - [address] [place] [address] Phone: [phone] Fax: [phone]  [facility] - [place], [place] - [address] [place] [address] [place] [address] Phone: [phone] Fax: [phone]  [id number] [place], [place] - [address] [place] [address] Phone: [phone] Fax: [phone]   Patient reports affordability concerns with their medications: No  Patient reports access/transportation concerns to their pharmacy: No  Patient reports adherence concerns with their medications:  Yes  - has had trouble with remembering to inject insulin  and Ozempic  in the past due to dementia. She now puts a sheet of paper with her insulin  and Ozempic  dose instructions on her bed pillow so she remembers to take Tresiba  before bed and Ozempic  on Mondays. Denies missed or delayed doses since starting this method.  Diabetes:  Current medications: Ozempic  2 mg weekly, Tresiba  40 units daily, glipizide  XL 5 mg BID Medications tried in the past: dapagliflozin  (Farxiga )--d/c'd due to recurrent yeast  infections, metformin  d/c'd due to diarrhea  Patient endorses some constipation this past week (mainly straining to use the bathroom). Last BM was this morning.  Using glucometer; testing 1-2 times daily Fasting BG in AM: 199, 186, 237, 182, 114, 173, 187 2 hr post-prandial BG (after supper):190, 208  Patient denies hypoglycemic s/sx including dizziness, shakiness, sweating. Patient denies hyperglycemic symptoms including polyuria, polydipsia, polyphagia, nocturia, neuropathy, blurred vision.  Current meal patterns:  - Will have 1 meal per day and maybe a snack earlier in the day, says she just doesn't feel hungry - Supper: barbecue, banana sandwich, tomato sandwich, chicken salad sandwich, egg salad sandwich - Snacks: chips - Drinks: water , dr pepper zero, black coffee  Current physical activity: encouraged as able  Current medication access support: enrolled in [employer] PAP until December 2025 (Ozempic  and Tresiba )    Objective:  Lab Results  Component Value Date   HGBA1C 12.4 (H) 01/03/2024    Lab Results  Component Value Date   CREATININE 0.85 07/28/2023   BUN 10 07/28/2023   NA 140 07/28/2023   K 4.1 07/28/2023   CL 97 07/28/2023   CO2 28 07/28/2023    Lab Results  Component Value Date   CHOL 141 03/05/2023   HDL 31 (L) 03/05/2023   LDLCALC 75 03/05/2023   TRIG 205 (H) 03/05/2023   CHOLHDL 4.5 (H) 03/05/2023    Medications Reviewed Today     Reviewed by Philmore Bream, RPH (Pharmacist) on 02/02/24 at 1638  Med List Status: <None>   Medication Order Taking? Sig Documenting Provider Last Dose Status Informant  Accu-Chek Softclix  Lancets lancets [id number]  USE TO check blood sugar TWICE DAILY [provider]  Active   Alpha-Lipoic Acid 600 MG CAPS [id number]  TAKE (1) CAPSULE DAILY FOR NERVE PAIN Vicky Grange M, DO  Active   Ascorbic Acid (VITAMIN C) 500 MG CHEW [id number]  1 tablet [provider]  Active   aspirin 81 MG tablet  [id number]  Take 81 mg by mouth daily. [provider]  Active Self  atenolol  (TENORMIN ) 25 MG tablet [id number]  Take 1 tablet (25 mg total) by mouth daily. Vicky Grange M, DO  Active   blood glucose meter kit and supplies [id number]  Dispense based on patient and insurance preference. Use up to four times daily as directed. (FOR ICD-10 E10.9, E11.9). Vicky Grange M, DO  Active   buPROPion  (WELLBUTRIN  XL) 300 MG 24 hr tablet [id number]  Take 1 tablet (300 mg total) by mouth daily. Vicky Grange M, DO  Active   busPIRone  (BUSPAR ) 10 MG tablet [id number]  TAKE (1) TABLET TWICE A DAY. Vicky Grange M, DO  Active   cholecalciferol (VITAMIN D ) 1000 UNITS tablet [id number]  Take 1,000 Units by mouth daily. [provider]  Active Self  citalopram  (CELEXA ) 40 MG tablet [id number]  TAKE ONE TABLET ONCE DAILY Vicky Grange M, DO  Active   Cyanocobalamin (VITAMIN B 12 PO) [id number]  Take by mouth. [provider]  Active   diclofenac  Sodium (VOLTAREN  ARTHRITIS PAIN) 1 % GEL [id number]  Apply 2 g topically 4 (four) times daily. Lylia Sand, MD  Active   folic acid  (FOLVITE ) 1 MG tablet [id number]  Take 1 mg by mouth daily. [provider]  Active   glipiZIDE  (GLUCOTROL  XL) 5 MG 24 hr tablet [id number]  Take 1 tablet (5 mg total) by mouth 2 (two) times daily with a meal. Bonnell Butcher, Ashly M, DO  Active   glucose blood (ACCU-CHEK GUIDE TEST) test strip [id number]  Check sugar 2 times daily E11.9 Gottschalk, Ashly M, DO  Active   insulin  degludec (TRESIBA  FLEXTOUCH) 100 UNIT/ML FlexTouch Pen [id number] Yes Inject 20-30 Units into the skin at bedtime. Dose change  Patient taking differently: Inject 40 Units into the skin at bedtime. Dose change   Vicky Grange M, DO  Active            Med Note Alpheus Arvin, Donata Fryer   Fri Jan 14, 2024  2:27 PM) Via novo nordisk patient assistance program    Insulin  Pen Needle (NOVOFINE PLUS PEN NEEDLE) 32G X 4 MM MISC [id number]   UAD to inject insulin . E11.65 Eliodoro Guerin, DO  Active   Lancet Device MISC [id number]  Check sugar 2 times daily E11.9 Vicky Grange M, DO  Active   lidocaine  4 % [id number]  Place 2 patches onto the skin daily. Lylia Sand, MD  Active   losartan  (COZAAR ) 100 MG tablet [id number]  Take 1 tablet (100 mg total) by mouth daily. Vicky Grange M, DO  Active   methotrexate (RHEUMATREX) 2.5 MG tablet [id number]  6 tablets Orally once weekly on the same day for 90 days [provider]  Active   omeprazole  (PRILOSEC) 40 MG capsule [id number]  Take 1 capsule (40 mg total) by mouth daily. Vicky Grange M, DO  Active   oxyCODONE -acetaminophen  (PERCOCET) 7.5-325 MG tablet [id number]  Take 1 tablet by mouth every 8 (eight) hours as needed for severe pain (pain score 7-10). Lylia Sand, MD  Active   pregabalin  (LYRICA ) 200 MG capsule [id number]  TAKE  ONE CAPSULE BY MOUTH TWICE DAILY Lylia Sand, MD  Active   Pyridoxine HCl (VITAMIN B-6 PO) [id number]  Take by mouth. [provider]  Active   Semaglutide , 2 MG/DOSE, 8 MG/3ML SOPN [id number]  Inject 2 mg as directed once a week. Via novo nordisk patient assistance Vicky Grange M, DO  Active   simvastatin  (ZOCOR ) 40 MG tablet [id number]  Take 1 tablet (40 mg total) by mouth daily. Vicky Grange M, DO  Active   sulfaSALAzine (AZULFIDINE) 500 MG tablet [id number]  Take 500 mg by mouth 2 (two) times daily. [provider]  Active   tiZANidine  (ZANAFLEX ) 4 MG tablet [id number]  Take 0.5-1 tablets (2-4 mg total) by mouth every 8 (eight) hours as needed for muscle spasms. Vicky Grange M, DO  Active               Assessment/Plan:   Diabetes: - Currently uncontrolled based on last A1C 12.4% on 01/03/24, above goal <7%. Patient reported fasting BG readings are improving, now mostly in the 100s, but still elevated above goal 80-130. Post prandial BG readings are also elevated above goal <180. Denies s/sx  of hypoglycemia and BG<70. Appropriate to increase basal insulin  again today. Extensively discussed diet. Reviewed my plate method and counseled to try to eat at least 2 meals per day. Recommended increasing in take of non-starchy vegetables and protein and limiting portion sizes of carb heavy foods (bread, chips). Recommended increasing intake of water  and fiber to help with constipation. Consider discontinuing glipizide  in the future, unclear if she is still receiving benefit from this. She has an intolerance to metformin  due to diarrhea, but only IR metformin  appears in her history. Could consider trial of metformin  XR. If BG remains elevated despite maximizing basal insulin  dose (42 units = ~0.4 units/kg), may need to consider addition of prandial insulin . - Reviewed long term cardiovascular and renal outcomes of uncontrolled blood sugar - Reviewed goal A1c, goal fasting, and goal 2 hour post prandial glucose - Recommend to increase Tresiba  to 42 units daily - Recommend to continue Ozempic  2 mg weekly - Recommend to continue glipizide  XL 5 mg BID  - Patient denies personal or family history of multiple endocrine neoplasia type 2, medullary thyroid  cancer; personal history of pancreatitis or gallbladder disease. - Recommend to check fasting blood glucose daily and 2 hr post prandial blood glucose at least a few times weekly. She would benefit from CGM, but as discussed previously she prefers to continue using finger stick checks. If she was to start on prandial insulin  would recommend retrying CGM. - Approved for Ozempic  and Tresiba  PAP through [employer].    Follow Up Plan: PharmD  in person on 02/15/24, PCP on 04/07/24  Georga Killings, PharmD PGY-1 Pharmacy Resident   Marvell Slider, PharmD, BCACP, CPP Clinical Pharmacist, [facility]

## 2024-02-09 ENCOUNTER — Telehealth: Payer: Self-pay | Admitting: Pharmacy Technician

## 2024-02-09 NOTE — Progress Notes (Signed)
   02/09/2024 Name: Donna Rogers MRN: [id number] DOB: 08/14/1951  Patient is appearing on a report for [facility] [place] [facility] Diabetes and last engaged with the clinical pharmacist to discuss diabetes on 02/02/2024. Contacted patient today to discuss diabetes management and completed medication review.   Diabetes Plan from last clinical pharmacist appointment:  Diabetes: - Currently uncontrolled based on last A1C 12.4% on 01/03/24, above goal <7%. Patient reported fasting BG readings are improving, now mostly in the 100s, but still elevated above goal 80-130. Post prandial BG readings are also elevated above goal <180. Denies s/sx of hypoglycemia and BG<70. Appropriate to increase basal insulin  again today. Extensively discussed diet. Reviewed my plate method and counseled to try to eat at least 2 meals per day. Recommended increasing in take of non-starchy vegetables and protein and limiting portion sizes of carb heavy foods (bread, chips). Recommended increasing intake of water  and fiber to help with constipation. Consider discontinuing glipizide  in the future, unclear if she is still receiving benefit from this. She has an intolerance to metformin  due to diarrhea, but only IR metformin  appears in her history. Could consider trial of metformin  XR. If BG remains elevated despite maximizing basal insulin  dose (42 units = ~0.4 units/kg), may need to consider addition of prandial insulin . - Reviewed long term cardiovascular and renal outcomes of uncontrolled blood sugar - Reviewed goal A1c, goal fasting, and goal 2 hour post prandial glucose - Recommend to increase Tresiba  to 42 units daily - Recommend to continue Ozempic  2 mg weekly - Recommend to continue glipizide  XL 5 mg BID  - Patient denies personal or family history of multiple endocrine neoplasia type 2, medullary thyroid  cancer; personal history of pancreatitis or gallbladder disease. - Recommend to check fasting blood glucose daily and 2 hr post  prandial blood glucose at least a few times weekly. She would benefit from CGM, but as discussed previously she prefers to continue using finger stick checks. If she was to start on prandial insulin  would recommend retrying CGM. - Approved for Ozempic  and Tresiba  PAP through [employer].   -Follow Up Plan: PharmD  in person on 02/15/24, PCP on 04/07/24(copy/paste from last note)   Medication Adherence Barriers Identified:  Patient made recommended medication changes per plan: Yes Spoke to patient's Significant other Meribeth. He informs patient is taking Tresiba  42 units every day, Ozempic  2mg  weekly and Glipizide  XL 5mg  daily. Patient receives Tresiba  and Ozempic  thru PAP. Per Dr. Annemarie, patient 's Glipizide  was last sold on 11/30/23 for 90 day supply. Access issues with any new medication or testing device: No  Patient is checking blood sugars as prescribed: Yes Meribeth informs patient normally checks blood sugar every morning beofre she eats by using fingersticks and the blood sugars have been ranging between 140-170.  Medication Adherence Barriers Addressed/Actions Taken:  Reviewed medication changes per plan from last clinical pharmacist note Reviewed instructions for monitoring blood sugars at home and reminded patient to keep a written log to review with pharmacist Reminded patient of date/time of upcoming clinical pharmacist follow up and any upcoming PCP/specialists visits. Patient denies transportation barriers to the appointment. Yes  Next clinical pharmacist appointment is scheduled for: 02/15/2024 with PharmD.  Lindsea Olivar, CPhT [facility] Office: [phone] Email: [email or web address]

## 2024-02-15 ENCOUNTER — Ambulatory Visit

## 2024-02-15 ENCOUNTER — Ambulatory Visit (HOSPITAL_BASED_OUTPATIENT_CLINIC_OR_DEPARTMENT_OTHER): Admission: RE | Admit: 2024-02-15 | Source: Ambulatory Visit

## 2024-02-21 DIAGNOSIS — G4733 Obstructive sleep apnea (adult) (pediatric): Secondary | ICD-10-CM | POA: Diagnosis not present

## 2024-02-28 ENCOUNTER — Telehealth: Payer: Self-pay

## 2024-02-29 ENCOUNTER — Ambulatory Visit

## 2024-03-03 ENCOUNTER — Encounter: Admitting: Physical Medicine & Rehabilitation

## 2024-03-07 ENCOUNTER — Ambulatory Visit

## 2024-03-09 ENCOUNTER — Other Ambulatory Visit: Payer: Self-pay | Admitting: Pharmacist

## 2024-03-09 ENCOUNTER — Ambulatory Visit (INDEPENDENT_AMBULATORY_CARE_PROVIDER_SITE_OTHER): Admitting: Pharmacist

## 2024-03-09 DIAGNOSIS — Z7985 Long-term (current) use of injectable non-insulin antidiabetic drugs: Secondary | ICD-10-CM

## 2024-03-09 DIAGNOSIS — E119 Type 2 diabetes mellitus without complications: Secondary | ICD-10-CM | POA: Diagnosis not present

## 2024-03-09 DIAGNOSIS — Z7984 Long term (current) use of oral hypoglycemic drugs: Secondary | ICD-10-CM

## 2024-03-09 DIAGNOSIS — Z794 Long term (current) use of insulin: Secondary | ICD-10-CM

## 2024-03-09 NOTE — Progress Notes (Signed)
 03/09/2024 Name: Donna Rogers MRN: [id number] DOB: 1951/03/19  Chief Complaint  Patient presents with   Diabetes    Donna Rogers is a 73 y.o. year old female who was referred for medication management by their primary care provider, Donna Rogers. They presented for a face to face visit today.   They were referred to the pharmacist by their PCP for assistance in managing diabetes and medication access    Subjective:  Patient presents to clinic in good spirits with her friend/caregiver, Donna Rogers (listed in demographics).  She states she is doing much better and reports her blood sugar is better as well.    Care Team: Primary Care Provider: Jolinda Potter HERO, Rogers ; Next Scheduled Visit: 04/07/24   Medication Access/Adherence  Current Pharmacy:  [facility] [place], [place] - [address] [place] [address] Phone: [phone] Fax: [phone]  [facility] - [place], [place] - [address] [place] [address] [place] [address] Phone: [phone] Fax: [phone]  [id number] - [place], [place] - [address] [place] [address] Phone: [phone] Fax: [phone]   Patient reports affordability concerns with their medications: Yes  Patient reports access/transportation concerns to their pharmacy: No  Patient reports adherence concerns with their medications:  Yes  friend Donna Rogers assists patient; son lives next door   Diabetes:  Current medications: Tresiba  36 units daily (night), Ozempic  2 mg weekly, glipizide  XL 5 mg BID with meals Medications tried in the past: dapagliflozin  (Farxiga )--d/c'd due to recurrent yeast infections, metformin  d/c'd due to GI distress   Current glucose readings: FBG 110s-230 Using accuchek guide meter; testing daily  Patient denies hypoglycemic s/sx including dizziness, shakiness, sweating. Patient denies hyperglycemic symptoms including  polyuria, polydipsia, polyphagia, nocturia, neuropathy, blurred vision.  Current meal patterns:  - Endorses continued appetite suppression with Ozempic  - Supper: enjoys tomato sandwich, egg salad sandwich, chicken salad sandwich - Drinks: water , dr pepper zero, black coffee Discussed meal planning options and Plate method for healthy eating Avoid sugary drinks and desserts Incorporate balanced protein, non starchy veggies, 1 serving of carbohydrate with each meal Increase water  intake Increase physical activity as able (limited by pain)  Current medication access support: novo nordisk PAP for tresiba /ozempic   Objective:  Lab Results  Component Value Date   HGBA1C 12.4 (H) 01/03/2024    Lab Results  Component Value Date   CREATININE 0.85 07/28/2023   BUN 10 07/28/2023   NA 140 07/28/2023   K 4.1 07/28/2023   CL 97 07/28/2023   CO2 28 07/28/2023    Lab Results  Component Value Date   CHOL 141 03/05/2023   HDL 31 (L) 03/05/2023   LDLCALC 75 03/05/2023   TRIG 205 (H) 03/05/2023   CHOLHDL 4.5 (H) 03/05/2023    Medications Reviewed Today     Reviewed by Donna Rogers, [facility] (Pharmacist) on 03/09/24 at 1302  Med List Status: <None>   Medication Order Taking? Sig Documenting Provider Last Dose Status Informant  Accu-Chek Softclix Lancets lancets [id number] No USE TO check blood sugar TWICE DAILY [provider] Taking Active   Alpha-Lipoic Acid 600 MG CAPS [id number]  TAKE (1) CAPSULE DAILY FOR NERVE PAIN Donna Rogers  Active   Ascorbic Acid (VITAMIN C) 500 MG CHEW [id number] No 1 tablet [provider] Taking Active   aspirin 81 MG tablet [id number] No Take 81  mg by mouth daily. [provider] Taking Active Self  atenolol  (TENORMIN ) 25 MG tablet [id number] No Take 1 tablet (25 mg total) by mouth daily. Donna Rogers Taking Active   blood glucose meter kit and supplies [id number] No Dispense based on patient and insurance  preference. Use up to four times daily as directed. (FOR ICD-10 E10.9, E11.9). Donna Rogers Taking Active   buPROPion  (WELLBUTRIN  XL) 300 MG 24 hr tablet [id number] No Take 1 tablet (300 mg total) by mouth daily. Donna Rogers Taking Active   busPIRone  (BUSPAR ) 10 MG tablet [id number] No TAKE (1) TABLET TWICE A DAY. Donna Rogers Taking Active   cholecalciferol (VITAMIN D ) 1000 UNITS tablet [id number] No Take 1,000 Units by mouth daily. [provider] Taking Active Self  citalopram  (CELEXA ) 40 MG tablet [id number] No TAKE ONE TABLET ONCE DAILY Donna Potter HERO, Rogers Taking Active   Cyanocobalamin (VITAMIN B 12 PO) [id number] No Take by mouth. [provider] Taking Active   diclofenac  Sodium (VOLTAREN  ARTHRITIS PAIN) 1 % GEL [id number] No Apply 2 g topically 4 (four) times daily. Donna Albright, MD Taking Active   folic acid  (FOLVITE ) 1 MG tablet [id number] No Take 1 mg by mouth daily. [provider] Taking Active   glipiZIDE  (GLUCOTROL  XL) 5 MG 24 hr tablet [id number] No Take 1 tablet (5 mg total) by mouth 2 (two) times daily with a meal. Donna Rogers Taking Active   glucose blood (ACCU-CHEK GUIDE TEST) test strip [id number] No Check sugar 2 times daily E11.9 Donna Rogers Taking Active   insulin  degludec (TRESIBA  FLEXTOUCH) 100 UNIT/ML FlexTouch Pen [id number]  Inject 20-30 Units into the skin at bedtime. Dose change  Patient taking differently: Inject 40 Units into the skin at bedtime. Dose change   Donna Rogers  Active            Med Note TENA, MLISS Rogers   Fri Jan 14, 2024  2:27 PM) Via novo nordisk patient assistance program    Insulin  Pen Needle (NOVOFINE PLUS PEN NEEDLE) 32G X 4 MM MISC [id number] No UAD to inject insulin . E11.65 Donna Potter HERO, Rogers Taking Active   Lancet Device MISC [id number] No Check sugar 2 times daily E11.9 Donna Rogers Taking Active   lidocaine  4 % [id number] No Place 2  patches onto the skin daily. Donna Albright, MD Taking Active   losartan  (COZAAR ) 100 MG tablet [id number] No Take 1 tablet (100 mg total) by mouth daily. Donna Rogers Taking Active   methotrexate (RHEUMATREX) 2.5 MG tablet [id number] No 6 tablets Orally once weekly on the same day for 90 days [provider] Taking Active   omeprazole  (PRILOSEC) 40 MG capsule [id number] No Take 1 capsule (40 mg total) by mouth daily. Donna Rogers Taking Active   oxyCODONE -acetaminophen  (PERCOCET) 7.5-325 MG tablet [id number]  Take 1 tablet by mouth every 8 (eight) hours as needed for severe pain (pain score 7-10). Donna Albright, MD  Active   pregabalin  (LYRICA ) 200 MG capsule [id number] No TAKE ONE CAPSULE BY MOUTH TWICE DAILY Donna Albright, MD Taking Active   Pyridoxine HCl (VITAMIN B-6 PO) [id number] No Take by mouth. [provider] Taking Active   Semaglutide , 2 MG/DOSE, 8 MG/3ML SOPN [id number] No Inject 2 mg as directed once a week. Via novo nordisk patient assistance Donna Potter HERO, Rogers Taking Active   simvastatin  (ZOCOR ) 40 MG tablet [id number] No  Take 1 tablet (40 mg total) by mouth daily. Donna Rogers Taking Active   sulfaSALAzine (AZULFIDINE) 500 MG tablet [id number] No Take 500 mg by mouth 2 (two) times daily. [provider] Taking Active   tiZANidine  (ZANAFLEX ) 4 MG tablet [id number] No Take 0.5-1 tablets (2-4 mg total) by mouth every 8 (eight) hours as needed for muscle spasms. Donna Potter HERO, Rogers Taking Active             Assessment/Plan:   Diabetes: - Currently uncontrolled, however recorded blood sugars by patient show improvement  - Reviewed long term cardiovascular and renal outcomes of uncontrolled blood sugar - Reviewed goal A1c, goal fasting, and goal 2 hour post prandial glucose - Recommend to : Increase Tresiba  to 44 units nightly Pen needles give to patient for insulin  pen Continue all other medications as  prescribed  Respectfully declines CGM although offered  Consider meal time insulin , not ideal given memory issues  - Patient denies personal or family history of multiple endocrine neoplasia type 2, medullary thyroid  cancer; personal history of pancreatitis or gallbladder disease. - Recommend to check glucose daily (fasting) or if symptomatic Approved for Ozempic  and Tresiba  PAP through Novo.    Follow Up Plan: PCP in August, PharmD in September  Mliss Tarry Griffin, PharmD, JAQUELINE, CPP Clinical Pharmacist, [facility]

## 2024-03-09 NOTE — Progress Notes (Unsigned)
   03/09/2024 Name: Donna Rogers MRN: [id number] DOB: 05-01-1951  No chief complaint on file.   Donna Rogers is a 73 y.o. year old female who was referred for medication management by their primary care provider, Donna Rogers M, DO. They presented for a face to face visit today.   They were referred to the pharmacist by their PCP for assistance in managing diabetes and medication access    Subjective:  Care Team: Primary Care Provider: Jolinda Rogers HERO, DO ; Next Scheduled Visit: *** {careteamprovider:27366}  Medication Access/Adherence  Current Pharmacy:  [facility] [place], [place] - [address] [place] [address] Phone: [phone] Fax: [phone]  [facility] - [place], [place] - [address] [place] [address] Phone: [phone] Fax: [phone]  [id number] [place], [place] - [address] [place] [address] Phone: [phone] Fax: [phone]   Patient reports affordability concerns with their medications: {YES/NO:21197} Patient reports access/transportation concerns to their pharmacy: {YES/NO:21197} Patient reports adherence concerns with their medications:  {YES/NO:21197} ***   {Pharmacy S/O Choices:26420}   Objective:  Lab Results  Component Value Date   HGBA1C 12.4 (H) 01/03/2024    Lab Results  Component Value Date   CREATININE 0.85 07/28/2023   BUN 10 07/28/2023   NA 140 07/28/2023   K 4.1 07/28/2023   CL 97 07/28/2023   CO2 28 07/28/2023    Lab Results  Component Value Date   CHOL 141 03/05/2023   HDL 31 (L) 03/05/2023   LDLCALC 75 03/05/2023   TRIG 205 (H) 03/05/2023   CHOLHDL 4.5 (H) 03/05/2023    Medications Reviewed Today   Medications were not reviewed in this encounter       Assessment/Plan:   {Pharmacy A/P Choices:26421} Increase tresiba  44 units Follow Up Plan: ***  ***

## 2024-03-22 ENCOUNTER — Telehealth: Payer: Self-pay

## 2024-03-22 NOTE — Telephone Encounter (Signed)
   Emailed refill form to [employer].

## 2024-03-23 DIAGNOSIS — G4733 Obstructive sleep apnea (adult) (pediatric): Secondary | ICD-10-CM | POA: Diagnosis not present

## 2024-04-07 ENCOUNTER — Encounter: Payer: Self-pay | Admitting: Family Medicine

## 2024-04-07 ENCOUNTER — Ambulatory Visit: Admitting: Family Medicine

## 2024-04-07 VITALS — BP 113/77 | HR 78 | Temp 97.7°F | Ht 61.0 in | Wt 210.2 lb

## 2024-04-07 DIAGNOSIS — Z794 Long term (current) use of insulin: Secondary | ICD-10-CM | POA: Diagnosis not present

## 2024-04-07 DIAGNOSIS — E119 Type 2 diabetes mellitus without complications: Secondary | ICD-10-CM

## 2024-04-07 DIAGNOSIS — Z87891 Personal history of nicotine dependence: Secondary | ICD-10-CM

## 2024-04-07 DIAGNOSIS — E1159 Type 2 diabetes mellitus with other circulatory complications: Secondary | ICD-10-CM | POA: Diagnosis not present

## 2024-04-07 DIAGNOSIS — E1142 Type 2 diabetes mellitus with diabetic polyneuropathy: Secondary | ICD-10-CM | POA: Diagnosis not present

## 2024-04-07 DIAGNOSIS — E785 Hyperlipidemia, unspecified: Secondary | ICD-10-CM

## 2024-04-07 DIAGNOSIS — R011 Cardiac murmur, unspecified: Secondary | ICD-10-CM

## 2024-04-07 DIAGNOSIS — I152 Hypertension secondary to endocrine disorders: Secondary | ICD-10-CM

## 2024-04-07 DIAGNOSIS — E1169 Type 2 diabetes mellitus with other specified complication: Secondary | ICD-10-CM

## 2024-04-07 LAB — BAYER DCA HB A1C WAIVED: HB A1C (BAYER DCA - WAIVED): 7.3 % — ABNORMAL HIGH (ref 4.8–5.6)

## 2024-04-07 NOTE — Progress Notes (Signed)
 Subjective: CC:DM PCP: Jolinda Donna HERO, DO YEP:Donna Rogers is a 73 y.o. female presenting to clinic today for:  Discussed the use of AI scribe software for clinical note transcription with the patient, who gave verbal consent to proceed.  History of Present Illness   Donna Rogers is a 73 year old female with diabetes and rheumatoid arthritis who presents for a follow-up visit.  She is scheduled for a full dental extraction and has stopped aspirin seven days prior to the procedure.  Her blood sugar was 118 mg/dL this morning, showing improvement. She is on 44 units of Tresiba  daily, which has improved her energy levels. She continues to take GLP and glipizide .  She experiences swelling in her legs and feet, particularly after prolonged standing or walking, such as during shopping trips. No excessive salt intake.  She is concerned about her kidney function and is awaiting blood work results. She is on methotrexate and sulfasalazine for rheumatoid arthritis, managed by her rheumatologist.  She has a history of smoking from age 43 until five to six years ago, with a 40-year pack history. She is interested in lung cancer screening due to this history.  She experiences dizziness   She suffers from neuropathy in her feet, causing pain and peeling.        Diabetes Health Maintenance Due  Topic Date Due   OPHTHALMOLOGY EXAM  04/04/2022   HEMOGLOBIN A1C  07/05/2024   FOOT EXAM  07/27/2024    ROS: Per HPI  Allergies  Allergen Reactions   Ciprofloxacin Hcl Rash   Etanercept Rash and Dermatitis   Farxiga  [Dapagliflozin ] Other (See Comments)    Reports recurrent yeast infections; currently holding therapy   Metformin  And Related Diarrhea   Past Medical History:  Diagnosis Date   Allergy    seasonal allergies   Anemia    hx of   Arthritis    rheumatoid   Depression    on meds   Diabetes (HCC)    Diabetic peripheral neuropathy (HCC)    GERD (gastroesophageal  reflux disease)    on meds   Headache    Hyperlipidemia    on meds   Hyperplastic rectal polyp 10/21/2015   Hypertension    pt. denies   Lymphocytic colitis    Neuropathy    Rheumatoid arthritis (HCC)    Shortness of breath dyspnea    with exertion    Current Outpatient Medications:    Accu-Chek Softclix Lancets lancets, USE TO check blood sugar TWICE DAILY, Disp: , Rfl:    Alpha-Lipoic Acid 600 MG CAPS, TAKE (1) CAPSULE DAILY FOR NERVE PAIN, Disp: 90 capsule, Rfl: 0   Ascorbic Acid (VITAMIN C) 500 MG CHEW, 1 tablet, Disp: , Rfl:    aspirin 81 MG tablet, Take 81 mg by mouth daily., Disp: , Rfl:    atenolol  (TENORMIN ) 25 MG tablet, Take 1 tablet (25 mg total) by mouth daily., Disp: 90 tablet, Rfl: 3   blood glucose meter kit and supplies, Dispense based on patient and insurance preference. Use up to four times daily as directed. (FOR ICD-10 E10.9, E11.9)., Disp: 1 each, Rfl: 0   buPROPion  (WELLBUTRIN  XL) 300 MG 24 hr tablet, Take 1 tablet (300 mg total) by mouth daily., Disp: 90 tablet, Rfl: 3   cholecalciferol (VITAMIN D ) 1000 UNITS tablet, Take 1,000 Units by mouth daily., Disp: , Rfl:    Cyanocobalamin (VITAMIN B 12 PO), Take by mouth., Disp: , Rfl:    folic acid  (  FOLVITE ) 1 MG tablet, Take 1 mg by mouth daily., Disp: , Rfl:    glipiZIDE  (GLUCOTROL  XL) 5 MG 24 hr tablet, Take 1 tablet (5 mg total) by mouth 2 (two) times daily with a meal., Disp: 180 tablet, Rfl: 3   glucose blood (ACCU-CHEK GUIDE TEST) test strip, Check sugar 2 times daily E11.9, Disp: 200 strip, Rfl: 3   insulin  degludec (TRESIBA  FLEXTOUCH) 100 UNIT/ML FlexTouch Pen, Inject 20-30 Units into the skin at bedtime. Dose change, Disp: 30 mL, Rfl: 3   Insulin  Pen Needle (NOVOFINE PLUS PEN NEEDLE) 32G X 4 MM MISC, UAD to inject insulin . E11.65, Disp: 100 each, Rfl: 3   Lancet Device MISC, Check sugar 2 times daily E11.9, Disp: 100 each, Rfl: 12   losartan  (COZAAR ) 100 MG tablet, Take 1 tablet (100 mg total) by mouth  daily., Disp: 90 tablet, Rfl: 3   methotrexate (RHEUMATREX) 2.5 MG tablet, 6 tablets Orally once weekly on the same day for 90 days, Disp: , Rfl:    omeprazole  (PRILOSEC) 40 MG capsule, Take 1 capsule (40 mg total) by mouth daily., Disp: 90 capsule, Rfl: 3   oxyCODONE -acetaminophen  (PERCOCET) 7.5-325 MG tablet, Take 1 tablet by mouth every 8 (eight) hours as needed for severe pain (pain score 7-10)., Disp: 90 tablet, Rfl: 0   pregabalin  (LYRICA ) 200 MG capsule, TAKE ONE CAPSULE BY MOUTH TWICE DAILY, Disp: 60 capsule, Rfl: 5   Pyridoxine HCl (VITAMIN B-6 PO), Take by mouth., Disp: , Rfl:    Semaglutide , 2 MG/DOSE, 8 MG/3ML SOPN, Inject 2 mg as directed once a week. Via [employer] patient assistance, Disp: , Rfl:    simvastatin  (ZOCOR ) 40 MG tablet, Take 1 tablet (40 mg total) by mouth daily., Disp: 90 tablet, Rfl: 3   sulfaSALAzine (AZULFIDINE) 500 MG tablet, Take 500 mg by mouth 2 (two) times daily., Disp: , Rfl:    busPIRone  (BUSPAR ) 10 MG tablet, TAKE (1) TABLET TWICE A DAY. (Patient not taking: Reported on 04/07/2024), Disp: 60 tablet, Rfl: 1   citalopram  (CELEXA ) 40 MG tablet, TAKE ONE TABLET ONCE DAILY (Patient not taking: Reported on 04/07/2024), Disp: 90 tablet, Rfl: 3   diclofenac  Sodium (VOLTAREN  ARTHRITIS PAIN) 1 % GEL, Apply 2 g topically 4 (four) times daily. (Patient not taking: Reported on 04/07/2024), Disp: 350 g, Rfl: 4   lidocaine  4 %, Place 2 patches onto the skin daily. (Patient not taking: Reported on 04/07/2024), Disp: 28 patch, Rfl: 3   tiZANidine  (ZANAFLEX ) 4 MG tablet, Take 0.5-1 tablets (2-4 mg total) by mouth every 8 (eight) hours as needed for muscle spasms. (Patient not taking: Reported on 04/07/2024), Disp: 90 tablet, Rfl: 0 Social History   Socioeconomic History   Marital status: Significant Other    Spouse name: Not on file   Number of children: 1   Years of education: 11th   Highest education level: 11th grade  Occupational History   Occupation: retired  Tobacco  Use   Smoking status: Former    Current packs/day: 1.00    Average packs/day: 1 pack/day for 46.0 years (46.0 ttl pk-yrs)    Types: Cigarettes   Smokeless tobacco: Never  Vaping Use   Vaping status: Some Days  Substance and Sexual Activity   Alcohol use: No    Alcohol/week: 0.0 standard drinks of alcohol   Drug use: No   Sexual activity: Not Currently  Other Topics Concern   Not on file  Social History Narrative   The patient is divorced she  has 1 son who is married that she has a grandchild.   She does not use alcohol or drugs she drinks 3-4 caffeinated beverages daily   She is a smoker   She lives with her boyfriend   Social Drivers of Corporate investment banker Strain: Low Risk  (04/20/2023)   Overall Financial Resource Strain (CARDIA)    Difficulty of Paying Living Expenses: Not hard at all  Food Insecurity: No Food Insecurity (04/20/2023)   Hunger Vital Sign    Worried About Running Out of Food in the Last Year: Never true    Ran Out of Food in the Last Year: Never true  Transportation Needs: No Transportation Needs (04/20/2023)   PRAPARE - Administrator, Civil Service (Medical): No    Lack of Transportation (Non-Medical): No  Physical Activity: Inactive (04/20/2023)   Exercise Vital Sign    Days of Exercise per Week: 0 days    Minutes of Exercise per Session: 0 min  Stress: No Stress Concern Present (04/20/2023)   [employer] of Occupational Health - Occupational Stress Questionnaire    Feeling of Stress : Not at all  Social Connections: Moderately Isolated (04/20/2023)   Social Connection and Isolation Panel    Frequency of Communication with Friends and Family: More than three times a week    Frequency of Social Gatherings with Friends and Family: More than three times a week    Attends Religious Services: Never    Database administrator or Organizations: No    Attends Banker Meetings: Never    Marital Status: Living with partner  Intimate  Partner Violence: Not At Risk (04/20/2023)   Humiliation, Afraid, Rape, and Kick questionnaire    Fear of Current or Ex-Partner: No    Emotionally Abused: No    Physically Abused: No    Sexually Abused: No   Family History  Problem Relation Age of Onset   Alzheimer's disease Mother    Lung cancer Mother 8   Diabetes Father    Heart disease Father    Arthritis Sister    Diabetes Sister    Diabetes Brother    Hypertension Brother    Colon cancer Neg Hx    Stomach cancer Neg Hx    Colon polyps Neg Hx    Esophageal cancer Neg Hx    Rectal cancer Neg Hx     Objective: Office vital signs reviewed. BP 113/77   Pulse 78   Temp 97.7 F (36.5 C)   Ht 5' 1 (1.549 m)   Wt 210 lb 4 oz (95.4 kg)   SpO2 91%   BMI 39.73 kg/m   Physical Examination:  General: Awake, alert, well appearing, well nourished, No acute distress HEENT: sclera white, MMM Cardio: regular rate and rhythm, S1S2 heard, soft systolic murmur at LSB appreciated Pulm: clear to auscultation bilaterally, no wheezes, rhonchi or rales; normal work of breathing on room air Extremities: warm, well perfused, trace leg edema to shins, No cyanosis or clubbing; +2 pulses bilaterally   Lab Results  Component Value Date   HGBA1C 12.4 (H) 01/03/2024    Assessment/ Plan: 73 y.o. female   Insulin -requiring or dependent type II diabetes mellitus (HCC) - Plan: Microalbumin / creatinine urine ratio, Bayer DCA Hb A1c Waived, CMP14+EGFR, CBC  Diabetic polyneuropathy associated with type 2 diabetes mellitus (HCC) - Plan: CMP14+EGFR, CBC  Hyperlipidemia associated with type 2 diabetes mellitus (HCC) - Plan: CMP14+EGFR, Lipid Panel  Hypertension associated with  diabetes (HCC) - Plan: CMP14+EGFR  Former heavy tobacco smoker - Plan: Ambulatory Referral Lung Cancer Screening [facility]  Newly recognized heart murmur - Plan: ECHOCARDIOGRAM COMPLETE  Assessment and Plan    Type 2 diabetes mellitus with HTN, HLD, diabetic  neuropathy and diabetic dermatitis Blood glucose levels improved with current insulin .  Her A1c is down to 7.3 this is a wonderful reduction, as it was 12.4 just 3 months ago.  Neuropathy causing foot pain. Dermatitis causing foot peeling. - Continue 44 units Tresiba . - Recommend Jimmy's Foot Cream for neuropathy. - Advise aggressive moisturizing for dermatitis.  New heart murmur Soft murmur likely due to aortic stenosis. Frequent dizziness suggests need for further evaluation. Discussed potential worsening symptoms. - Order echocardiogram to assess aortic stenosis severity.  Lower extremity edema and varicose veins Swelling in legs likely due to prolonged standing/sitting. Varicose veins may contribute to discomfort. - Recommend compression stockings for edema management.  History of long-term tobacco use Over 40 years smoking history, quit 5-6 years ago. Eligible for lung cancer screening. Discussed importance of early detection. - Order lung cancer screening and coordinate with lung screening coordinator.          Donna CHRISTELLA Fielding, DO [facility] [phone]

## 2024-04-08 LAB — CMP14+EGFR
ALT: 22 IU/L (ref 0–32)
AST: 20 IU/L (ref 0–40)
Albumin: 4.1 g/dL (ref 3.8–4.8)
Alkaline Phosphatase: 77 IU/L (ref 44–121)
BUN/Creatinine Ratio: 16 (ref 12–28)
BUN: 14 mg/dL (ref 8–27)
Bilirubin Total: 0.2 mg/dL (ref 0.0–1.2)
CO2: 27 mmol/L (ref 20–29)
Calcium: 8.8 mg/dL (ref 8.7–10.3)
Chloride: 103 mmol/L (ref 96–106)
Creatinine, Ser: 0.89 mg/dL (ref 0.57–1.00)
Globulin, Total: 2.6 g/dL (ref 1.5–4.5)
Glucose: 146 mg/dL — ABNORMAL HIGH (ref 70–99)
Potassium: 3.6 mmol/L (ref 3.5–5.2)
Sodium: 145 mmol/L — ABNORMAL HIGH (ref 134–144)
Total Protein: 6.7 g/dL (ref 6.0–8.5)
eGFR: 69 mL/min/1.73 (ref >59)

## 2024-04-08 LAB — MICROALBUMIN / CREATININE URINE RATIO
Creatinine, Urine: 193.3 mg/dL
Microalb/Creat Ratio: 13 mg/g{creat} (ref 0–29)
Microalbumin, Urine: 25.4 ug/mL

## 2024-04-08 LAB — CBC
Hematocrit: 40.3 % (ref 34.0–46.6)
Hemoglobin: 13.5 g/dL (ref 11.1–15.9)
MCH: 30.8 pg (ref 26.6–33.0)
MCHC: 33.5 g/dL (ref 31.5–35.7)
MCV: 92 fL (ref 79–97)
Platelets: 252 x10E3/uL (ref 150–450)
RBC: 4.38 x10E6/uL (ref 3.77–5.28)
RDW: 13.1 % (ref 11.7–15.4)
WBC: 8.9 x10E3/uL (ref 3.4–10.8)

## 2024-04-08 LAB — LIPID PANEL
Chol/HDL Ratio: 3.7 ratio (ref 0.0–4.4)
Cholesterol, Total: 121 mg/dL (ref 100–199)
HDL: 33 mg/dL — ABNORMAL LOW (ref >39)
LDL Chol Calc (NIH): 67 mg/dL (ref 0–99)
Triglycerides: 116 mg/dL (ref 0–149)
VLDL Cholesterol Cal: 21 mg/dL (ref 5–40)

## 2024-04-10 ENCOUNTER — Ambulatory Visit: Payer: Self-pay | Admitting: Family Medicine

## 2024-04-10 DIAGNOSIS — R011 Cardiac murmur, unspecified: Secondary | ICD-10-CM

## 2024-04-14 ENCOUNTER — Other Ambulatory Visit: Payer: Self-pay | Admitting: Family Medicine

## 2024-04-14 DIAGNOSIS — E1142 Type 2 diabetes mellitus with diabetic polyneuropathy: Secondary | ICD-10-CM

## 2024-04-20 ENCOUNTER — Ambulatory Visit (INDEPENDENT_AMBULATORY_CARE_PROVIDER_SITE_OTHER): Payer: Medicare Other

## 2024-04-20 VITALS — BP 113/77 | HR 78 | Ht 61.0 in | Wt 210.0 lb

## 2024-04-20 DIAGNOSIS — Z0000 Encounter for general adult medical examination without abnormal findings: Secondary | ICD-10-CM | POA: Diagnosis not present

## 2024-04-20 NOTE — Progress Notes (Signed)
 Subjective:   Donna Rogers is a 73 y.o. who presents for a Medicare Wellness preventive visit.  As a reminder, Annual Wellness Visits don't include a physical exam, and some assessments may be limited, especially if this visit is performed virtually. We may recommend an in-person follow-up visit with your provider if needed.  Visit Complete: Virtual I connected with  Donna Rogers on 04/20/24 by a audio enabled telemedicine application and verified that I am speaking with the correct person using two identifiers.  Patient Location: Home  Provider Location: Home Office  I discussed the limitations of evaluation and management by telemedicine. The patient expressed understanding and agreed to proceed.  Vital Signs: Because this visit was a virtual/telehealth visit, some criteria may be missing or patient reported. Any vitals not documented were not able to be obtained and vitals that have been documented are patient reported.  VideoDeclined- This patient declined Librarian, academic. Therefore the visit was completed with audio only.  Persons Participating in Visit: Patient.  AWV Questionnaire: No: Patient Medicare AWV questionnaire was not completed prior to this visit.  Cardiac Risk Factors include: advanced age (>57men, >20 women);diabetes mellitus;dyslipidemia;hypertension;obesity (BMI >30kg/m2);smoking/ tobacco exposure     Objective:    Today's Vitals   04/20/24 1139 04/20/24 1142  BP: 113/77   Pulse: 78   Weight: 210 lb (95.3 kg)   Height: 5' 1 (1.549 m)   PainSc:  5    Body mass index is 39.68 kg/m.     04/20/2024   11:49 AM 04/20/2023    2:39 PM 04/16/2022    3:07 PM 04/15/2021    4:09 PM 10/31/2019    2:32 PM 08/29/2019   10:20 AM 07/27/2018   12:17 PM  Advanced Directives  Does Patient Have a Medical Advance Directive? No No No No No No No   Would patient like information on creating a medical advance directive?  Yes  (MAU/Ambulatory/Procedural Areas - Information given) No - Patient declined Yes (MAU/Ambulatory/Procedural Areas - Information given) No - Patient declined       Data saved with a previous flowsheet row definition    Current Medications (verified) Outpatient Encounter Medications as of 04/20/2024  Medication Sig   Accu-Chek Softclix Lancets lancets USE TO check blood sugar TWICE DAILY   Alpha-Lipoic Acid 600 MG CAPS TAKE (1) CAPSULE DAILY FOR NERVE PAIN   Ascorbic Acid (VITAMIN C) 500 MG CHEW 1 tablet   aspirin 81 MG tablet Take 81 mg by mouth daily.   atenolol  (TENORMIN ) 25 MG tablet Take 1 tablet (25 mg total) by mouth daily.   blood glucose meter kit and supplies Dispense based on patient and insurance preference. Use up to four times daily as directed. (FOR ICD-10 E10.9, E11.9).   buPROPion  (WELLBUTRIN  XL) 300 MG 24 hr tablet Take 1 tablet (300 mg total) by mouth daily.   cholecalciferol (VITAMIN D ) 1000 UNITS tablet Take 1,000 Units by mouth daily.   citalopram  (CELEXA ) 40 MG tablet TAKE ONE TABLET ONCE DAILY   Cyanocobalamin (VITAMIN B 12 PO) Take by mouth.   folic acid  (FOLVITE ) 1 MG tablet Take 1 mg by mouth daily.   glipiZIDE  (GLUCOTROL  XL) 5 MG 24 hr tablet Take 1 tablet (5 mg total) by mouth 2 (two) times daily with a meal.   glucose blood (ACCU-CHEK GUIDE TEST) test strip Check sugar 2 times daily E11.9   insulin  degludec (TRESIBA  FLEXTOUCH) 100 UNIT/ML FlexTouch Pen Inject 20-30 Units into the skin  at bedtime. Dose change   Insulin  Pen Needle (NOVOFINE PLUS PEN NEEDLE) 32G X 4 MM MISC UAD to inject insulin . E11.65   Lancet Device MISC Check sugar 2 times daily E11.9   losartan  (COZAAR ) 100 MG tablet Take 1 tablet (100 mg total) by mouth daily.   methotrexate (RHEUMATREX) 2.5 MG tablet 6 tablets Orally once weekly on the same day for 90 days   omeprazole  (PRILOSEC) 40 MG capsule Take 1 capsule (40 mg total) by mouth daily.   oxyCODONE -acetaminophen  (PERCOCET) 7.5-325 MG tablet  Take 1 tablet by mouth every 8 (eight) hours as needed for severe pain (pain score 7-10).   pregabalin  (LYRICA ) 200 MG capsule TAKE ONE CAPSULE BY MOUTH TWICE DAILY   Pyridoxine HCl (VITAMIN B-6 PO) Take by mouth.   Semaglutide , 2 MG/DOSE, 8 MG/3ML SOPN Inject 2 mg as directed once a week. Via novo nordisk patient assistance   simvastatin  (ZOCOR ) 40 MG tablet Take 1 tablet (40 mg total) by mouth daily.   busPIRone  (BUSPAR ) 10 MG tablet TAKE (1) TABLET TWICE A DAY. (Patient not taking: Reported on 04/20/2024)   diclofenac  Sodium (VOLTAREN  ARTHRITIS PAIN) 1 % GEL Apply 2 g topically 4 (four) times daily. (Patient not taking: Reported on 04/20/2024)   lidocaine  4 % Place 2 patches onto the skin daily. (Patient not taking: Reported on 04/20/2024)   sulfaSALAzine (AZULFIDINE) 500 MG tablet Take 500 mg by mouth 2 (two) times daily. (Patient not taking: Reported on 04/20/2024)   tiZANidine  (ZANAFLEX ) 4 MG tablet Take 0.5-1 tablets (2-4 mg total) by mouth every 8 (eight) hours as needed for muscle spasms. (Patient not taking: Reported on 04/20/2024)   No facility-administered encounter medications on file as of 04/20/2024.    Allergies (verified) Ciprofloxacin hcl, Etanercept, Farxiga  [dapagliflozin ], and Metformin  and related   History: Past Medical History:  Diagnosis Date   Allergy    seasonal allergies   Anemia    hx of   Arthritis    rheumatoid   Depression    on meds   Diabetes (HCC)    Diabetic peripheral neuropathy (HCC)    GERD (gastroesophageal reflux disease)    on meds   Headache    Hyperlipidemia    on meds   Hyperplastic rectal polyp 10/21/2015   Hypertension    pt. denies   Lymphocytic colitis    Neuropathy    Rheumatoid arthritis (HCC)    Shortness of breath dyspnea    with exertion   Past Surgical History:  Procedure Laterality Date   BREAST LUMPECTOMY Right 2018   BREAST LUMPECTOMY WITH RADIOACTIVE SEED LOCALIZATION Left 03/03/2016   Procedure: BREAST LUMPECTOMY WITH  RADIOACTIVE SEED LOCALIZATION;  Surgeon: Vicenta Poli, MD;  Location: [facility] OR;  Service: General;  Laterality: Left;   COLONOSCOPY  08/2002   RMR: internal hemorrhoids   COLONOSCOPY N/A 10/21/2015   Procedure: COLONOSCOPY;  Surgeon: Lamar CHRISTELLA Hollingshead, MD;  Location: [facility];  Service: Endoscopy;  Laterality: N/A;  250 - moved to 2:35 - office to notify pt   CYST REMOVAL TRUNK     ESOPHAGOGASTRODUODENOSCOPY  08/2002   RMR: nonerosive reflux esophagitis, hiatal hernia   TONSILLECTOMY     WISDOM TOOTH EXTRACTION     Family History  Problem Relation Age of Onset   Alzheimer's disease Mother    Lung cancer Mother 33   Diabetes Father    Heart disease Father    Arthritis Sister    Diabetes Sister    Diabetes Brother  Hypertension Brother    Colon cancer Neg Hx    Stomach cancer Neg Hx    Colon polyps Neg Hx    Esophageal cancer Neg Hx    Rectal cancer Neg Hx    Social History   Socioeconomic History   Marital status: Significant Other    Spouse name: Not on file   Number of children: 1   Years of education: 11th   Highest education level: 11th grade  Occupational History   Occupation: retired  Tobacco Use   Smoking status: Former    Current packs/day: 1.00    Average packs/day: 1 pack/day for 46.0 years (46.0 ttl pk-yrs)    Types: Cigarettes   Smokeless tobacco: Never  Vaping Use   Vaping status: Some Days  Substance and Sexual Activity   Alcohol use: No    Alcohol/week: 0.0 standard drinks of alcohol   Drug use: No   Sexual activity: Not Currently  Other Topics Concern   Not on file  Social History Narrative   The patient is divorced she has 1 son who is married that she has a grandchild.   She does not use alcohol or drugs she drinks 3-4 caffeinated beverages daily   She is a smoker   She lives with her boyfriend   Social Drivers of Corporate investment banker Strain: Low Risk  (04/20/2024)   Overall Financial Resource Strain (CARDIA)    Difficulty of Paying  Living Expenses: Not hard at all  Food Insecurity: No Food Insecurity (04/20/2024)   Hunger Vital Sign    Worried About Running Out of Food in the Last Year: Never true    Ran Out of Food in the Last Year: Never true  Transportation Needs: No Transportation Needs (04/20/2024)   PRAPARE - Administrator, Civil Service (Medical): No    Lack of Transportation (Non-Medical): No  Physical Activity: Inactive (04/20/2024)   Exercise Vital Sign    Days of Exercise per Week: 0 days    Minutes of Exercise per Session: 0 min  Stress: Stress Concern Present (04/20/2024)   [employer] of Occupational Health - Occupational Stress Questionnaire    Feeling of Stress: To some extent  Social Connections: Moderately Isolated (04/20/2024)   Social Connection and Isolation Panel    Frequency of Communication with Friends and Family: Twice a week    Frequency of Social Gatherings with Friends and Family: Twice a week    Attends Religious Services: Never    Database administrator or Organizations: No    Attends Engineer, structural: Never    Marital Status: Living with partner    Tobacco Counseling Counseling given: Yes    Clinical Intake:  Pre-visit preparation completed: Yes  Pain : 0-10 (back pain) Pain Score: 5  Pain Type: Chronic pain Pain Location: Back Pain Orientation: Lower Pain Descriptors / Indicators: Constant Pain Onset: More than a month ago Pain Frequency: Constant Pain Relieving Factors: tylenol   Pain Relieving Factors: tylenol   BMI - recorded: 36.68 Nutritional Status: BMI > 30  Obese Nutritional Risks: None Diabetes: Yes  Lab Results  Component Value Date   HGBA1C 7.3 (H) 04/07/2024   HGBA1C 12.4 (H) 01/03/2024   HGBA1C 9.0 (H) 07/28/2023     How often do you need to have someone help you when you read instructions, pamphlets, or other written materials from your doctor or pharmacy?: 1 - Never  Interpreter Needed?: No  Information entered by  :: alia  t/cma   Activities of Daily Living     04/20/2024   11:44 AM  In your present state of health, do you have any difficulty performing the following activities:  Hearing? 1  Vision? 0  Difficulty concentrating or making decisions? 1  Comment forgetful  Walking or climbing stairs? 1  Dressing or bathing? 0  Doing errands, shopping? 1  Comment pt's friend  Quarry manager and eating ? N  Using the Toilet? N  In the past six months, have you accidently leaked urine? Y  Do you have problems with loss of bowel control? N  Managing your Medications? Y  Comment pt's friend/signifcant other  Managing your Finances? N  Housekeeping or managing your Housekeeping? N    Patient Care Team: Jolinda Norene HERO, DO as PCP - General (Family Medicine) Frances, Ozell RAMAN, [name] as Social Worker (Licensed Clinical Social Worker) Billee, Mliss BIRCH, [facility] as Pharmacist (Family Medicine) Avram Lupita BRAVO, MD as Consulting Physician (Gastroenterology) [facility] (Specialist)  I have updated your Care Teams any recent Medical Services you may have received from other providers in the past year.     Assessment:   This is a routine wellness examination for Donna Rogers.  Hearing/Vision screen Hearing Screening - Comments:: Pt have some loss of hearing  Vision Screening - Comments:: Pt wear glasses/pt goes to [facility] in [place]/last a yr ago   Goals Addressed             This Visit's Progress    Patient Stated       Pt would like to cook/have family over for dinner       Depression Screen     04/20/2024   11:51 AM 04/07/2024    4:58 PM 01/07/2024    1:21 PM 01/03/2024    3:17 PM 11/08/2023    1:20 PM 08/26/2023    1:00 PM 07/28/2023    2:33 PM  PHQ 2/9 Scores  PHQ - 2 Score 4 3 2 1  0 0 3  PHQ- 9 Score 8 14  13   16     Fall Risk     04/20/2024   11:41 AM 04/07/2024    4:58 PM 01/07/2024    1:21 PM 01/03/2024    3:26 PM 01/03/2024    3:16 PM  Fall Risk   Falls in the past year? 1 1  0 1 0  Number falls in past yr: 0 1 0 1 0  Injury with Fall? 1 0 0 0 0  Risk for fall due to : Impaired balance/gait;Impaired mobility   Impaired balance/gait;Impaired mobility;Medication side effect No Fall Risks  Follow up Falls evaluation completed Falls evaluation completed  Education provided;Falls evaluation completed Falls evaluation completed    MEDICARE RISK AT HOME:  Medicare Risk at Home Any stairs in or around the home?: Yes If so, are there any without handrails?: Yes Home free of loose throw rugs in walkways, pet beds, electrical cords, etc?: Yes Adequate lighting in your home to reduce risk of falls?: Yes Life alert?: No Use of a cane, walker or w/c?: Yes Grab bars in the bathroom?: Yes Shower chair or bench in shower?: Yes Elevated toilet seat or a handicapped toilet?: Yes  TIMED UP AND GO:  Was the test performed?  no  Cognitive Function: 6CIT completed        04/20/2024   11:57 AM 04/20/2023    2:40 PM 04/16/2022    3:09 PM 04/15/2021    4:11 PM 10/31/2019  2:39 PM  6CIT Screen  What Year? 0 points 0 points 0 points 0 points 0 points  What month? 0 points 0 points 0 points 0 points 0 points  What time? 0 points 0 points 0 points 0 points 0 points  Count back from 20 0 points 0 points 0 points 0 points 0 points  Months in reverse 0 points 0 points 0 points 4 points 0 points  Repeat phrase 8 points 0 points 0 points 0 points 0 points  Total Score 8 points 0 points 0 points 4 points 0 points    Immunizations Immunization History  Administered Date(s) Administered   Fluad Quad(high Dose 65+) 05/03/2019, 05/04/2022, 08/05/2022   Fluad Trivalent(High Dose 65+) 07/28/2023   Influenza,inj,Quad PF,6+ Mos 05/25/2013, 07/31/2015, 06/18/2016, 05/16/2018   Influenza-Unspecified 05/07/2019, 07/24/2020, 05/08/2021   PPD Test 05/08/2013   Pneumococcal Conjugate-13 05/16/2018   Pneumococcal Polysaccharide-23 07/03/2019   Tdap 12/28/2018   Zoster Recombinant(Shingrix)  06/06/2019, 06/30/2019, 10/27/2019    Screening Tests Health Maintenance  Topic Date Due   OPHTHALMOLOGY EXAM  04/04/2022   Lung Cancer Screening  02/01/2024   COVID-19 Vaccine (1) 04/23/2024 (Originally 08/26/1955)   INFLUENZA VACCINE  11/14/2024 (Originally 03/17/2024)   MAMMOGRAM  01/02/2025 (Originally 12/19/2017)   FOOT EXAM  07/27/2024   HEMOGLOBIN A1C  10/08/2024   Diabetic kidney evaluation - eGFR measurement  04/07/2025   Diabetic kidney evaluation - Urine ACR  04/07/2025   Medicare Annual Wellness (AWV)  04/20/2025   Colonoscopy  10/20/2025   DEXA SCAN  01/06/2026   DTaP/Tdap/Td (2 - Td or Tdap) 12/27/2028   Pneumococcal Vaccine: 50+ Years  Completed   Hepatitis C Screening  Completed   Zoster Vaccines- Shingrix  Completed   HPV VACCINES  Aged Out   Meningococcal B Vaccine  Aged Out    Health Maintenance  Health Maintenance Due  Topic Date Due   OPHTHALMOLOGY EXAM  04/04/2022   Lung Cancer Screening  02/01/2024   Health Maintenance Items Addressed: See Nurse Notes at the end of this note  Additional Screening:  Vision Screening: Recommended annual ophthalmology exams for early detection of glaucoma and other disorders of the eye. Would you like a referral to an eye doctor? No    Dental Screening: Recommended annual dental exams for proper oral hygiene  Community Resource Referral / Chronic Care Management: CRR required this visit?  No   CCM required this visit?  No   Plan:    I have personally reviewed and noted the following in the patient's chart:   Medical and social history Use of alcohol, tobacco or illicit drugs  Current medications and supplements including opioid prescriptions. Patient is currently taking opioid prescriptions. Information provided to patient regarding non-opioid alternatives. Patient advised to discuss non-opioid treatment plan with their provider. Functional ability and status Nutritional status Physical activity Advanced  directives List of other physicians Hospitalizations, surgeries, and ER visits in previous 12 months Vitals Screenings to include cognitive, depression, and falls Referrals and appointments  In addition, I have reviewed and discussed with patient certain preventive protocols, quality metrics, and best practice recommendations. A written personalized care plan for preventive services as well as general preventive health recommendations were provided to patient.   Ozie Ned, [name]   04/20/2024   After Visit Summary: (Declined) Due to this being a telephonic visit, with patients personalized plan was offered to patient but patient Declined AVS at this time   Notes: PCP Follow Up Recommendations: pt is aware  and due the following: diabetic eye exam, pt is going to make an apt for exam

## 2024-04-20 NOTE — Patient Instructions (Addendum)
 Ms. Curl , Thank you for taking time out of your busy schedule to complete your Annual Wellness Visit with me. I enjoyed our conversation and look forward to speaking with you again next year. I, as well as your care team,  appreciate your ongoing commitment to your health goals. Please review the following plan we discussed and let me know if I can assist you in the future. Your Game plan/ To Do List    Referrals: If you haven't heard from the office you've been referred to, please reach out to them at the phone provided.   Follow up Visits: We will see or speak with you next year for your Next Medicare AWV with our clinical staff on 04/24/25 10:40a.m. Have you seen your provider in the last 6 months (3 months if uncontrolled diabetes)? Yes  Clinician Recommendations:  Aim for 30 minutes of exercise or brisk walking, 6-8 glasses of water , and 5 servings of fruits and vegetables each day.       This is a list of the screenings recommended for you:  Health Maintenance  Topic Date Due   Eye exam for diabetics  04/04/2022   Screening for Lung Cancer  02/01/2024   Medicare Annual Wellness Visit  04/19/2024   COVID-19 Vaccine (1) 04/23/2024*   Flu Shot  11/14/2024*   Mammogram  01/02/2025*   Complete foot exam   07/27/2024   Hemoglobin A1C  10/08/2024   Yearly kidney function blood test for diabetes  04/07/2025   Yearly kidney health urinalysis for diabetes  04/07/2025   Colon Cancer Screening  10/20/2025   DEXA scan (bone density measurement)  01/06/2026   DTaP/Tdap/Td vaccine (2 - Td or Tdap) 12/27/2028   Pneumococcal Vaccine for age over 22  Completed   Hepatitis C Screening  Completed   Zoster (Shingles) Vaccine  Completed   HPV Vaccine  Aged Out   Meningitis B Vaccine  Aged Out  *Topic was postponed. The date shown is not the original due date.    Advanced directives: (Declined) Advance directive discussed with you today. Even though you declined this today, please call our  office should you change your mind, and we can give you the proper paperwork for you to fill out. Advance Care Planning is important because it:  [x]  Makes sure you receive the medical care that is consistent with your values, goals, and preferences  [x]  It provides guidance to your family and loved ones and reduces their decisional burden about whether or not they are making the right decisions based on your wishes.  Follow the link provided in your after visit summary or read over the paperwork we have mailed to you to help you started getting your Advance Directives in place. If you need assistance in completing these, please reach out to us  so that we can help you!  See attachments for Preventive Care and Fall Prevention Tips.

## 2024-04-23 DIAGNOSIS — G4733 Obstructive sleep apnea (adult) (pediatric): Secondary | ICD-10-CM | POA: Diagnosis not present

## 2024-04-24 DIAGNOSIS — M79641 Pain in right hand: Secondary | ICD-10-CM | POA: Diagnosis not present

## 2024-04-24 DIAGNOSIS — M0579 Rheumatoid arthritis with rheumatoid factor of multiple sites without organ or systems involvement: Secondary | ICD-10-CM | POA: Diagnosis not present

## 2024-04-24 DIAGNOSIS — E669 Obesity, unspecified: Secondary | ICD-10-CM | POA: Diagnosis not present

## 2024-04-24 DIAGNOSIS — M79642 Pain in left hand: Secondary | ICD-10-CM | POA: Diagnosis not present

## 2024-04-24 DIAGNOSIS — M1991 Primary osteoarthritis, unspecified site: Secondary | ICD-10-CM | POA: Diagnosis not present

## 2024-04-24 DIAGNOSIS — K52832 Lymphocytic colitis: Secondary | ICD-10-CM | POA: Diagnosis not present

## 2024-04-24 DIAGNOSIS — Z6838 Body mass index (BMI) 38.0-38.9, adult: Secondary | ICD-10-CM | POA: Diagnosis not present

## 2024-04-24 DIAGNOSIS — M8580 Other specified disorders of bone density and structure, unspecified site: Secondary | ICD-10-CM | POA: Diagnosis not present

## 2024-04-24 DIAGNOSIS — R5382 Chronic fatigue, unspecified: Secondary | ICD-10-CM | POA: Diagnosis not present

## 2024-04-26 ENCOUNTER — Encounter (INDEPENDENT_AMBULATORY_CARE_PROVIDER_SITE_OTHER)

## 2024-04-26 DIAGNOSIS — E119 Type 2 diabetes mellitus without complications: Secondary | ICD-10-CM

## 2024-04-26 DIAGNOSIS — Z794 Long term (current) use of insulin: Secondary | ICD-10-CM

## 2024-04-26 DIAGNOSIS — Z7985 Long-term (current) use of injectable non-insulin antidiabetic drugs: Secondary | ICD-10-CM

## 2024-04-26 NOTE — Progress Notes (Signed)
 No show  This encounter was created in error - please disregard.

## 2024-04-27 ENCOUNTER — Encounter: Payer: Self-pay | Admitting: Physical Medicine & Rehabilitation

## 2024-04-27 ENCOUNTER — Encounter: Attending: Physical Medicine & Rehabilitation | Admitting: Physical Medicine & Rehabilitation

## 2024-04-27 VITALS — BP 126/78 | HR 79 | Ht 61.0 in | Wt 210.0 lb

## 2024-04-27 DIAGNOSIS — M47819 Spondylosis without myelopathy or radiculopathy, site unspecified: Secondary | ICD-10-CM | POA: Diagnosis not present

## 2024-04-27 DIAGNOSIS — E1142 Type 2 diabetes mellitus with diabetic polyneuropathy: Secondary | ICD-10-CM | POA: Insufficient documentation

## 2024-04-27 DIAGNOSIS — M545 Low back pain, unspecified: Secondary | ICD-10-CM | POA: Insufficient documentation

## 2024-04-27 DIAGNOSIS — M25511 Pain in right shoulder: Secondary | ICD-10-CM | POA: Insufficient documentation

## 2024-04-27 DIAGNOSIS — R202 Paresthesia of skin: Secondary | ICD-10-CM | POA: Diagnosis not present

## 2024-04-27 DIAGNOSIS — R200 Anesthesia of skin: Secondary | ICD-10-CM | POA: Insufficient documentation

## 2024-04-27 DIAGNOSIS — G8929 Other chronic pain: Secondary | ICD-10-CM | POA: Diagnosis not present

## 2024-04-27 DIAGNOSIS — Z794 Long term (current) use of insulin: Secondary | ICD-10-CM

## 2024-04-27 NOTE — Progress Notes (Signed)
 Subjective:    Patient ID: Donna Rogers, female    DOB: Jan 05, 1951, 73 y.o.   MRN: [id number]  HPI   HPI on 06/05/22 Donna Rogers is a 73 yo female with PMH of rheumatoid arthritis, chronic low back pain, diabetes mellitus type 2 with polyneuropathy, GERD, carpal tunnel right wrist, depression who is here for chronic pain.  Patient reports that she has been having burning and freezing pain in her feet that has been worsening over the past few years.  She says she feels like her feet are on ice.  Pain is worse on the dorsal and plantar aspects of her feet however she also has this pain over her ankles.  She was seen in the past by Dr. Bonner for lumbar spine arthritis and reports that she had epidural steroid injections x2.  She reports the first injection helped a little bit but the second injection did not provide any relief.  She continues to have sharp and aching pain in her lower back and sometimes pain shoots into her thighs and legs.  Back pain is worsened with walking or activities.  Additionally she has severe pain in her right shoulder.  She reports shoulder injection did not help the pain.  Shoulder pain is worsened with movement.  She additionally has pain in both of her hips hands, knees.  Pain makes it difficult to sleep.  She is followed by rheumatology by Dr. Lynwood Ramsay and Rosaline Salt and is currently treated with methotrexate.  She says that she was was previously on Enbrel.   Prior treatments Tylenol  and ibuprofen in past helped slightly Diclofenac  oral did not help Blu Emu cream- helps a little Gabapentin  1200mg  BID to TID with mild to moderate improvement She has not tried Lyrica  Hydrocodone  helped in the past, it stopped working  She has not tried oxycodone  Tizanidine  did not help   Interval History Donna Rogers is here for follow-up due to her chronic pain.  She reports that her polyneuropathy is much improved since starting Lyrica .  She feels that this  works much better than the gabapentin .  She says she would like to try Qutenza however cost is prohibitive at this time.  She continues to have lower back pain and shoulder pain bilaterally that limits her activities.  The lower back pain did not improve significantly with the change to Lyrica .  She has not had any side effects with this medication.  She reports she previously tried tramadol  and it initially helped mildly but later did not provide significant benefit.  She will occasionally get a tingling sensation in the fourth and fifth digits on both hands but this is not present currently.  She feels like she has poor balance overall however has not had any recent falls.  She does not use a cane for ambulation at this time but she has been home.     Interval History 10/01/22 Donna Rogers is here for follow-up regarding her chronic pain.  She reports that oxycodone  is helping to control her lower back pain.  She uses this medication sparingly only when the pain is very severe.  The medication allows her to do more active activities and tasks at her house.  She continues to take Lyrica  for burning sensation in her feet.  She does feel like the medication is not helping as much as it did when we spoke at her last visit.  Lyrica  does not currently give her the drunk feeling she was getting  when she is taking gabapentin .  She denies any history of CKD or known history of kidney dysfunction.  She continues to have right shoulder pain.  She says that she would like to repeat the right shoulder injection.  She previously had this completed by her orthopedic doctor.  She asked if we can completed at her next visit if possible.     Interval History 4/1 Donna Rogers is here regarding her chronic pain and for a right shoulder injection.  She reports that oxycodone  has been providing benefit to her pain however she feels like the 5 mg dose is not strong enough.  Previously when she tried 2 tablets of the oxycodone  5  mg this provided better relief for her pain.  She continues to have back pain that is sometimes shooting down her right leg.  She continues to have pain in her right shoulder that is limiting.  She does not have any side effects with these are working well for her and not.     Interval history 02/05/2023 Donna Rogers is here for follow-up regarding her chronic low back pain, shoulder pain and polyneuropathy.  She reports her shoulder is doing much better since we did the shoulder injection last visit.  Oxycodone  7.5 3 times daily is helping her pain stay very controlled.  She continues to have a lot of lumbar back pain however it is more tolerable with this medication.  For the past few months she has been using Lyrica  150 mg twice daily noted on PDMP review.  In February and March she was getting 200 mg of Lyrica  twice daily however I think she may have reverted back to an older prescription and started taking up to 150 mg tabs from the pharmacy.  She is unsure what kind of difference is made to her pain.  She does continue to have pain in her feet that has been attributed to polyneuropathy.     Interval history 03/12/2023 Donna Rogers is here for follow-up regarding her chronic lower back pain, right shoulder pain and polyneuropathy.  Patient feels that her pain is doing better overall since her last visit.  She does ask when she can do her repeat shoulder injection as the pain is gradually coming back.  She does not feel like she needs an injection today because pain is still overall controlled.  She reports current medications are working well for her and not causing any significant side effects.  Patient reports she is using Percocet sparingly and this appears consistent with her pill count.  Interval history 05/13/2023 Donna Rogers is here for follow-up regarding her chronic lower back pain, right shoulder pain and polyneuropathy.  Patient reports that she continues to have pain in these areas however it is  doing better than it was previously.  She has not needed to use this many tabs of oxycodone  and still has 90 tabs left today.  She reports her shoulder pain is under control as well.  She does have numbness and pain in her feet, she reports a cold sensation in her feet.  When she does need oxycodone  she reports it is helping her pain.   Interval history 08/25/22 Her Dortha is tomorrow.  She was recently started on CPAP for OSA. She continues to have pain in her lower back. Oxycodone  helps her pain, she uses sparingly only when pain is severe.  Pain is also coming back in R shoulder.   She also has pain from polyneuropathy in her b/l feet.  Lyrica  is helping but not keeping it under control.    Interval history 09/26/22 Pt continues to have severe pain in her feet. This visit was scheduled as f/u visit instead of Qutenza. She is still interested in Qutenza treatment. She also has pain in her lower back. Oxycodone  continues to help her pain, still using it only when pain is very severe. She continues to have R shoulder pain.   Interval history 11/08/23 Patient is here for right shoulder injection, reports good benefit last time this was completed.  She also continues to have lower back pain, MRI completed but results pending.  Oxycodone  helping keep her pain under control, she only uses intermittently when pain is very severe.  No side effects with the medication.  Qutenza treatment was declined by insurance, needs to try lidocaine  patches first.  Interval history 01/07/2024 Patient reports continued back pain, limiting her activities.  Unfortunately shoulder injection last visit did not help as much as it did the first time.  She would like to try a repeat injection to see if this will help again.  She had an MRI of her lumbar spine completed which showed multilevel moderate to severe bilateral facet arthropathy, moderate spinal canal stenosis L1-2, L2-3 and L3-4 and multilevel foraminal stenosis.  Her  pain is primarily axial located in her bilateral lower L-spine.  Her back pain is improved with bending forward, worsens when she gets back up.  She denies pain radiating into her legs and feet. She reports continued benefit with oxycodone , no significant side effects reported.   Interval history 01/07/2024 Follow-up for chronic low back pain. Last seen in May. Pain is primarily localized to the low back. Reports tolerating the pain, but it is functionally limiting. Cannot stand for long periods, which interferes with activities of daily living such as cooking and washing dishes. Uses oxycodone  (Percocet) sparingly for severe pain exacerbations, requiring rest afterward. Has not needed a refill since May. Peripheral neuropathy symptoms in feet are improved. Using a topical product called [employer] Foot Cream from a local store  which provides significant relief with infrequent use. Continues pregabalin  (Lyrica ) 200 mg twice daily for nerve pain, with good compliance. Numbness in the right fourth and fifth digits has resolved. Experiences occasional morning stiffness in the hands but no residual numbness. Diabetes management has improved significantly; blood glucose is now in the 100s, down from 300-400s.  Reports feeling much better overall with improved glycemic control.  Reports occasional radiating pain down the right leg into hip and to the thigh,but describes the back pain as more severe.  Pain Inventory Average Pain 7 Pain Right Now 2 My pain is sharp and aching  In the last 24 hours, has pain interfered with the following? General activity 7 Relation with others 0 Enjoyment of life 8 What TIME of day is your pain at its worst? morning , daytime, evening, and night Sleep (in general) Poor  Pain is worse with: walking, inactivity, and standing Pain improves with: rest and medication Relief from Meds: 7  Family History  Problem Relation Age of Onset   Alzheimer's disease Mother     Lung cancer Mother 76   Diabetes Father    Heart disease Father    Arthritis Sister    Diabetes Sister    Diabetes Brother    Hypertension Brother    Colon cancer Neg Hx    Stomach cancer Neg Hx    Colon polyps Neg Hx    Esophageal cancer Neg  Hx    Rectal cancer Neg Hx    Social History   Socioeconomic History   Marital status: Significant Other    Spouse name: Not on file   Number of children: 1   Years of education: 11th   Highest education level: 11th grade  Occupational History   Occupation: retired  Tobacco Use   Smoking status: Former    Current packs/day: 1.00    Average packs/day: 1 pack/day for 46.0 years (46.0 ttl pk-yrs)    Types: Cigarettes   Smokeless tobacco: Never  Vaping Use   Vaping status: Some Days  Substance and Sexual Activity   Alcohol use: No    Alcohol/week: 0.0 standard drinks of alcohol   Drug use: No   Sexual activity: Not Currently  Other Topics Concern   Not on file  Social History Narrative   The patient is divorced she has 1 son who is married that she has a grandchild.   She does not use alcohol or drugs she drinks 3-4 caffeinated beverages daily   She is a smoker   She lives with her boyfriend   Social Drivers of Corporate investment banker Strain: Low Risk  (04/20/2024)   Overall Financial Resource Strain (CARDIA)    Difficulty of Paying Living Expenses: Not hard at all  Food Insecurity: No Food Insecurity (04/20/2024)   Hunger Vital Sign    Worried About Running Out of Food in the Last Year: Never true    Ran Out of Food in the Last Year: Never true  Transportation Needs: No Transportation Needs (04/20/2024)   PRAPARE - Administrator, Civil Service (Medical): No    Lack of Transportation (Non-Medical): No  Physical Activity: Inactive (04/20/2024)   Exercise Vital Sign    Days of Exercise per Week: 0 days    Minutes of Exercise per Session: 0 min  Stress: Stress Concern Present (04/20/2024)   [employer] of  Occupational Health - Occupational Stress Questionnaire    Feeling of Stress: To some extent  Social Connections: Moderately Isolated (04/20/2024)   Social Connection and Isolation Panel    Frequency of Communication with Friends and Family: Twice a week    Frequency of Social Gatherings with Friends and Family: Twice a week    Attends Religious Services: Never    Database administrator or Organizations: No    Attends Engineer, structural: Never    Marital Status: Living with partner   Past Surgical History:  Procedure Laterality Date   BREAST LUMPECTOMY Right 2018   BREAST LUMPECTOMY WITH RADIOACTIVE SEED LOCALIZATION Left 03/03/2016   Procedure: BREAST LUMPECTOMY WITH RADIOACTIVE SEED LOCALIZATION;  Surgeon: Vicenta Poli, MD;  Location: [facility] OR;  Service: General;  Laterality: Left;   COLONOSCOPY  08/2002   RMR: internal hemorrhoids   COLONOSCOPY N/A 10/21/2015   Procedure: COLONOSCOPY;  Surgeon: Lamar CHRISTELLA Hollingshead, MD;  Location: [facility];  Service: Endoscopy;  Laterality: N/A;  250 - moved to 2:35 - office to notify pt   CYST REMOVAL TRUNK     ESOPHAGOGASTRODUODENOSCOPY  08/2002   RMR: nonerosive reflux esophagitis, hiatal hernia   TONSILLECTOMY     WISDOM TOOTH EXTRACTION     Past Surgical History:  Procedure Laterality Date   BREAST LUMPECTOMY Right 2018   BREAST LUMPECTOMY WITH RADIOACTIVE SEED LOCALIZATION Left 03/03/2016   Procedure: BREAST LUMPECTOMY WITH RADIOACTIVE SEED LOCALIZATION;  Surgeon: Vicenta Poli, MD;  Location: [facility] OR;  Service: General;  Laterality: Left;   COLONOSCOPY  08/2002   RMR: internal hemorrhoids   COLONOSCOPY N/A 10/21/2015   Procedure: COLONOSCOPY;  Surgeon: Lamar CHRISTELLA Hollingshead, MD;  Location: [facility];  Service: Endoscopy;  Laterality: N/A;  250 - moved to 2:35 - office to notify pt   CYST REMOVAL TRUNK     ESOPHAGOGASTRODUODENOSCOPY  08/2002   RMR: nonerosive reflux esophagitis, hiatal hernia   TONSILLECTOMY     WISDOM TOOTH EXTRACTION      Past Medical History:  Diagnosis Date   Allergy    seasonal allergies   Anemia    hx of   Arthritis    rheumatoid   Depression    on meds   Diabetes (HCC)    Diabetic peripheral neuropathy (HCC)    GERD (gastroesophageal reflux disease)    on meds   Headache    Hyperlipidemia    on meds   Hyperplastic rectal polyp 10/21/2015   Hypertension    pt. denies   Lymphocytic colitis    Neuropathy    Rheumatoid arthritis (HCC)    Shortness of breath dyspnea    with exertion   BP 126/78   Pulse 79   Ht 5' 1 (1.549 m)   Wt 210 lb (95.3 kg)   SpO2 93%   BMI 39.68 kg/m   Opioid Risk Score:   Fall Risk Score:  `1  Depression screen [facility] 2/9     04/20/2024   11:51 AM 04/07/2024    4:58 PM 01/07/2024    1:21 PM 01/03/2024    3:17 PM 11/08/2023    1:20 PM 08/26/2023    1:00 PM 07/28/2023    2:33 PM  Depression screen PHQ 2/9  Decreased Interest 1 0 1 0 0 0 1  Down, Depressed, Hopeless 3 3 1 1  0 0 2  PHQ - 2 Score 4 3 2 1  0 0 3  Altered sleeping 0 1  3   3   Tired, decreased energy 3 3  3   3   Change in appetite 0 0  0   0  Feeling bad or failure about yourself  1 3  3   3   Trouble concentrating 0 0  0   1  Moving slowly or fidgety/restless 0 3  3   3   Suicidal thoughts 0 1  0   0  PHQ-9 Score 8 14  13   16   Difficult doing work/chores Very difficult Very difficult  Somewhat difficult   Very difficult      Review of Systems  Musculoskeletal:  Positive for back pain and gait problem.  All other systems reviewed and are negative.      Objective:   Physical Exam   Physical Exam Gen: no distress, normal appearing HEENT: oral mucosa pink and moist, NCAT Chest: normal effort, normal rate of breathing Abd: soft, non-distended Ext: no edema Psych: pleasant, appropriate Skin: intact Neuro: CN 2-12 grossly intact, follows commands No focal motor deficits noted Decreased sensation to LT in b/l LE below the knee No hand sensory deficits noted  today  Musculoskeletal: Only slight right shoulder pain with internal rotation Gait appears a little unsteady-advised patient to use a cane to help prevent falls Shoulder: Right shoulder examination is normal. Full range of motion without pain. No residual numbness. Back Examination: Tenderness to palpation over the right lower lumbar paraspinous muscles.  Minimal tenderness over the left lumbar region. Pain with lumbar extension. Axial loading provokes pain.  Pain is  localized to the lower right lumbar region, specifically noted as worse in the mid to lower lumbar spine.     Lumbar xray 08/26/17 FINDINGS: Five lumbar type vertebral bodies are well visualized. Vertebral body height is well maintained. Multilevel disc space narrowing is noted throughout the lumbar spine progressed in the interval from the prior exam. Osteophytic changes are noted as well. No anterolisthesis is seen. A somewhat rounded calcification is noted in the right upper quadrant which may be related to cholelithiasis. Clinical correlation is recommended.   IMPRESSION: Multilevel degenerative changes progressed when compared with the prior exam of 20/8.   Rounded calcification in the right upper quadrant may represent cholelithiasis.       MRI shoulder 08/22/21 IMPRESSION: 1. Full-thickness, full width tears of the supraspinatus and infraspinatus tendons with retraction to the glenoid. 2. Full-thickness subscapularis tear involving the mid to superior fibers with tendon gap up to 1.0 cm, and interstitial extension proximally. 3. Grade 2 muscle atrophy of the supraspinatus, infraspinatus, and subscapularis muscles. 4. Moderate glenohumeral and acromioclavicular joint osteoarthritis. 5. Likely torn and retracted long head biceps tendon. 6. Degenerative posterosuperior labral tearing.    L spine MRI 11/01/23  FINDINGS: Segmentation: Standard.   Alignment:  Physiologic lumbar alignment is maintained.    Vertebrae: Degenerative endplate marrow changes at a few levels. No compression fractures.   Conus medullaris and cauda equina: The conus medullaris terminates at the level of L1-L2. The distal spinal cord signal intensity is normal.   Paraspinal and other soft tissues: The visualized abdomen and pelvis show no soft tissue abnormality. The visualized aorta is normal.   Disc levels:   L1-L2: Disc bulge. Moderate bilateral facet arthropathy. Moderate bilateral neuroforaminal stenosis. Moderate spinal canal stenosis.   L2-L3: Disc bulge. Severe bilateral facet arthropathy. Moderate bilateral neuroforaminal stenosis. Moderate spinal canal stenosis.   L3-L4: Disc bulge. Severe bilateral facet arthropathy. Severe right and moderate left neuroforaminal stenosis. Moderate spinal canal stenosis.   L4-L5: Disc bulge. Moderate bilateral facet arthropathy. Moderate bilateral neuroforaminal stenosis. Mild spinal canal stenosis.   L5-S1: Disc bulge. Severe bilateral facet arthropathy. Severe bilateral neuroforaminal stenosis. No spinal canal stenosis.   IMPRESSION: 1. Moderate canal stenoses at L1-L2, L2-L3, and L3-L4 secondary to disc bulging and facet arthropathy. Findings have progressed since prior examination in 2008 2. Severe foraminal stenoses at L3-L4 and L5-S1. Moderate foraminal stenoses at other levels.     Assessment & Plan:     Chronic lower back pain.  Pain is primarily axial. -Patient has lumbar spinal stenosis, multilevel facet joint and multilevel neuroforaminal stenosis. -Discussed referral to neurosurgery-she is not interested in surgery at this time would like to hold off  -UDS and pain agreement completed prior visit -Continue oxycodone  7.5mg  Q8h prn #90-call when refill needed -Continue UDS and pill counts.  Continue PDMP monitoring.  Pain contract completed prior visit.  -Continue use of TENS -List of Home exercises for back pain provided prior visit.   Advised to try to do 3 times a weeks.  Patient reports she completed this for 6 weeks without benefit - Pain is primarily axial at this time in her right mid to lower lumbar spine, Refer to Dr. Sixto for MBB for right L5-S1, L4-5, L3-4 MBB/RFA next available      Right shoulder pain with biceps SS, IS and Subscapularis tears, GH and AC arthritis  -Continue medications as above -Voltaren  gel as needed - Patient was scheduled for right shoulder injection but shoulder pain has improved so  will not complete today  DM with polyneuropathy -Cont Lyrica  to 200mg  BID -Qutenza was declined by insurance.  Intermittent numbness in her fourth and fifth digit, Possibly some ulnar nerve irritation,  -Improved, denies any recent symptoms   Rheumatoid arthritis and polyarthralgia history -Continue f/u with rheumatology as directed

## 2024-05-03 ENCOUNTER — Ambulatory Visit (INDEPENDENT_AMBULATORY_CARE_PROVIDER_SITE_OTHER)

## 2024-05-03 ENCOUNTER — Other Ambulatory Visit: Payer: Self-pay | Admitting: Physical Medicine & Rehabilitation

## 2024-05-03 DIAGNOSIS — R011 Cardiac murmur, unspecified: Secondary | ICD-10-CM

## 2024-05-05 LAB — ECHOCARDIOGRAM COMPLETE
AR max vel: 2.13 cm2
AV Area VTI: 2.25 cm2
AV Area mean vel: 2.1 cm2
AV Mean grad: 6.3 mmHg
AV Peak grad: 12.3 mmHg
Ao pk vel: 1.75 m/s
Area-P 1/2: 3.62 cm2
S' Lateral: 2.27 cm

## 2024-05-11 ENCOUNTER — Telehealth: Payer: Self-pay

## 2024-05-11 NOTE — Telephone Encounter (Signed)
 Called and spoke with patients significant other to let him know that patients order of TRESIBA  is here at the office and is ready for pick up. He voiced understanding and said he would come by the office in a few minutes to pick up.

## 2024-05-11 NOTE — Progress Notes (Signed)
 Donna Rogers                                          MRN: [id number]   05/11/2024   The [facility] Quality Team Specialist reviewed this patient medical record for the purposes of chart review for care gap closure. The following were reviewed: abstraction for care gap closure-glycemic status assessment.    [facility] Quality Team

## 2024-05-23 DIAGNOSIS — G4733 Obstructive sleep apnea (adult) (pediatric): Secondary | ICD-10-CM | POA: Diagnosis not present

## 2024-06-27 ENCOUNTER — Encounter: Admitting: Physical Medicine & Rehabilitation

## 2024-07-03 ENCOUNTER — Encounter: Admitting: Physical Medicine & Rehabilitation

## 2024-07-03 ENCOUNTER — Telehealth: Payer: Self-pay

## 2024-07-03 ENCOUNTER — Telehealth: Payer: Self-pay | Admitting: Physical Medicine & Rehabilitation

## 2024-07-03 NOTE — Telephone Encounter (Signed)
 The following changes are in order for 2026:  Medicare patients on insulin  whose household income is below 150% of the federal poverty level will need to apply for Low Income Subsidy/Extra Help with Social Security. If denied, we will be able to use that letter to attempt enrollment/re-enrollment.  Will need to contact patient.

## 2024-07-03 NOTE — Telephone Encounter (Signed)
 Pt called for their med refill (Oxycodone  7.5). They mentioned that if you would recommend another med because the Oxycodone  is not working effectively.

## 2024-07-04 ENCOUNTER — Telehealth: Payer: Self-pay | Admitting: Physical Medicine & Rehabilitation

## 2024-07-04 MED ORDER — OXYCODONE-ACETAMINOPHEN 7.5-325 MG PO TABS: 1.0000 | ORAL_TABLET | Freq: Three times a day (TID) | ORAL | 0 refills | Status: DC | PRN

## 2024-07-04 NOTE — Telephone Encounter (Signed)
 P needs refill of oxycodone  sent to [facility] pharmacy

## 2024-07-10 ENCOUNTER — Telehealth: Payer: Self-pay

## 2024-07-10 NOTE — Telephone Encounter (Signed)
PA for Oxycodone submitted on CoverMyMeds.

## 2024-07-10 NOTE — Telephone Encounter (Signed)
 Left message requesting call back to discuss LIS - [phone]

## 2024-07-17 ENCOUNTER — Encounter: Admitting: Physical Medicine & Rehabilitation

## 2024-07-24 DIAGNOSIS — M79642 Pain in left hand: Secondary | ICD-10-CM | POA: Diagnosis not present

## 2024-07-24 DIAGNOSIS — Z6838 Body mass index (BMI) 38.0-38.9, adult: Secondary | ICD-10-CM | POA: Diagnosis not present

## 2024-07-24 DIAGNOSIS — M79641 Pain in right hand: Secondary | ICD-10-CM | POA: Diagnosis not present

## 2024-07-24 DIAGNOSIS — E669 Obesity, unspecified: Secondary | ICD-10-CM | POA: Diagnosis not present

## 2024-07-24 DIAGNOSIS — M1991 Primary osteoarthritis, unspecified site: Secondary | ICD-10-CM | POA: Diagnosis not present

## 2024-07-24 DIAGNOSIS — M8580 Other specified disorders of bone density and structure, unspecified site: Secondary | ICD-10-CM | POA: Diagnosis not present

## 2024-07-24 DIAGNOSIS — M0579 Rheumatoid arthritis with rheumatoid factor of multiple sites without organ or systems involvement: Secondary | ICD-10-CM | POA: Diagnosis not present

## 2024-07-24 DIAGNOSIS — R5382 Chronic fatigue, unspecified: Secondary | ICD-10-CM | POA: Diagnosis not present

## 2024-07-24 DIAGNOSIS — K52832 Lymphocytic colitis: Secondary | ICD-10-CM | POA: Diagnosis not present

## 2024-07-27 ENCOUNTER — Encounter: Attending: Physical Medicine & Rehabilitation | Admitting: Physical Medicine & Rehabilitation

## 2024-07-27 ENCOUNTER — Encounter: Payer: Self-pay | Admitting: Physical Medicine & Rehabilitation

## 2024-07-27 DIAGNOSIS — M47817 Spondylosis without myelopathy or radiculopathy, lumbosacral region: Secondary | ICD-10-CM

## 2024-07-27 MED ORDER — LIDOCAINE HCL 1 % IJ SOLN
10.0000 mL | Freq: Once | INTRAMUSCULAR | Status: AC
  Administered 2024-07-27: 10 mL

## 2024-07-27 MED ORDER — LIDOCAINE HCL (PF) 2 % IJ SOLN
2.0000 mL | Freq: Once | INTRAMUSCULAR | Status: AC
  Administered 2024-07-27: 2 mL

## 2024-07-27 MED ADMIN — Iohexol Inj 180 MG/ML: 2 mL | NDC 00407141110

## 2024-07-27 NOTE — Patient Instructions (Signed)
 Right L3-4-5 Lumbar medial branch blocks were performed. This is to help diagnose the cause of the low back pain. It is important that you keep track of your pain for the first day or 2 after injection. This injection can give you temporary relief that lasts for hours or up to several months. There is no way to predict duration of pain relief.  Please try to compare your pain after injection to for the injection.  If this injection gives you  temporary relief there may be another longer-lasting procedure that may be beneficial called radiofrequency ablation

## 2024-07-27 NOTE — Progress Notes (Signed)
Right lumbar L3, L4 medial branch blocks and L5 dorsal ramus injection under fluoroscopic guidance  Indication: Right Lumbar pain which is not relieved by medication management or other conservative care and interfering with self-care and mobility.  Informed consent was obtained after describing risks and benefits of the procedure with the patient, this includes bleeding, bruising, infection, paralysis and medication side effects. The patient wishes to proceed and has given written consent. The patient was placed in a prone position. The lumbar area was marked and prepped with Betadine. One ML of 1% lidocaine was injected into each of 3 areas into the skin and subcutaneous tissue. Then a 22-gauge 3.5 spinal needle was inserted targeting the junction of the Right S1 superior articular process and sacral ala junction. Needle was advanced under fluoroscopic guidance. Bone contact was made.Isovue 200 was injected x0.5 mL demonstrating no intravascular uptake. Then a solution containing 2% MPF lidocaine was injected x0.5 mL. Then the Right L5 superior articular process in transverse process junction was targeted. Bone contact was made.Isovue 200 was injected x0.5 mL demonstrating no intravascular uptake. Then a solution containing 2% MPF lidocaine was injected x0.5 mL. Then the Right L4 superior articular process in transverse process junction was targeted. Bone contact was made. Isovue 200 was injected x0.5 mL demonstrating no intravascular uptake. Then a solution containing2% MPF lidocaine was injected x0.5 mL Patient tolerated procedure well. Post procedure instructions were given. Please refer to post procedure form. 

## 2024-07-27 NOTE — Progress Notes (Signed)
°  PROCEDURE RECORD [facility]   Name: Donna Rogers DOB:[birth year] MRN: [id number]  Date:07/27/2024  Physician: Prentice Compton, MD    Nurse/[name]: [name]  Allergies: Allergies[1]  Consent Signed: Yes.    Is patient diabetic? Yes.    CBG today? 220  Pregnant: No. LMP: No LMP recorded. Patient is postmenopausal. (age 71-55)  Anticoagulants: no Anti-inflammatory: no Antibiotics: no  Procedure: Right L5-S1, L4-5,L3-4  Position: Prone Start Time: 348  End Time: 354  Fluoro Time: 38  RN/[name] [name], [name] [name], [name]    Time 3:22 pm     BP 129/78     Pulse 93     Respirations 16 16    O2 Sat 93     S/S 6 6    Pain Level 7/10 3     D/C home with sister, patient A & O X 3, D/C instructions reviewed, and sits independently.           [1]  Allergies Allergen Reactions   Ciprofloxacin Hcl Rash   Etanercept Rash and Dermatitis   Farxiga  [Dapagliflozin ] Other (See Comments)    Reports recurrent yeast infections; currently holding therapy   Metformin  And Related Diarrhea

## 2024-07-29 ENCOUNTER — Other Ambulatory Visit: Payer: Self-pay | Admitting: Family Medicine

## 2024-07-29 DIAGNOSIS — E1142 Type 2 diabetes mellitus with diabetic polyneuropathy: Secondary | ICD-10-CM

## 2024-08-21 ENCOUNTER — Ambulatory Visit (HOSPITAL_BASED_OUTPATIENT_CLINIC_OR_DEPARTMENT_OTHER): Admitting: Cardiology

## 2024-08-25 ENCOUNTER — Encounter: Attending: Physical Medicine & Rehabilitation | Admitting: Physical Medicine & Rehabilitation

## 2024-08-25 ENCOUNTER — Encounter: Payer: Self-pay | Admitting: Physical Medicine & Rehabilitation

## 2024-08-25 VITALS — BP 121/75 | HR 83 | Ht 61.0 in | Wt 208.0 lb

## 2024-08-25 DIAGNOSIS — Z5181 Encounter for therapeutic drug level monitoring: Secondary | ICD-10-CM | POA: Diagnosis not present

## 2024-08-25 DIAGNOSIS — M48061 Spinal stenosis, lumbar region without neurogenic claudication: Secondary | ICD-10-CM | POA: Insufficient documentation

## 2024-08-25 DIAGNOSIS — Z794 Long term (current) use of insulin: Secondary | ICD-10-CM

## 2024-08-25 DIAGNOSIS — M1712 Unilateral primary osteoarthritis, left knee: Secondary | ICD-10-CM | POA: Insufficient documentation

## 2024-08-25 DIAGNOSIS — Z79891 Long term (current) use of opiate analgesic: Secondary | ICD-10-CM | POA: Diagnosis not present

## 2024-08-25 DIAGNOSIS — E1142 Type 2 diabetes mellitus with diabetic polyneuropathy: Secondary | ICD-10-CM | POA: Diagnosis not present

## 2024-08-25 DIAGNOSIS — G894 Chronic pain syndrome: Secondary | ICD-10-CM | POA: Insufficient documentation

## 2024-08-25 MED ORDER — OXYCODONE-ACETAMINOPHEN 7.5-325 MG PO TABS: 1.0000 | ORAL_TABLET | Freq: Four times a day (QID) | ORAL | 0 refills | Status: AC | PRN

## 2024-08-25 NOTE — Progress Notes (Signed)
 "   Subjective:    Patient ID: Donna Rogers, female    DOB: Dec 16, 1950, 74 y.o.   MRN: [id number]  HPI   HPI on 06/05/22 Donna Rogers is a 74 yo female with PMH of rheumatoid arthritis, chronic low back pain, diabetes mellitus type 2 with polyneuropathy, GERD, carpal tunnel right wrist, depression who is here for chronic pain.  Patient reports that she has been having burning and freezing pain in Donna feet that has been worsening over the past few years.  She says she feels like Donna feet are on ice.  Pain is worse on the dorsal and plantar aspects of Donna feet however she also has this pain over Donna ankles.  She was seen in the past by Dr. Bonner for lumbar spine arthritis and reports that she had epidural steroid injections x2.  She reports the first injection helped a little bit but the second injection did not provide any relief.  She continues to have sharp and aching pain in Donna lower back and sometimes pain shoots into Donna thighs and legs.  Back pain is worsened with walking or activities.  Additionally she has severe pain in Donna right shoulder.  She reports shoulder injection did not help the pain.  Shoulder pain is worsened with movement.  She additionally has pain in both of Donna hips hands, knees.  Pain makes it difficult to sleep.  She is followed by rheumatology by Dr. Lynwood Ramsay and Rosaline Salt and is currently treated with methotrexate.  She says that she was was previously on Enbrel.   Prior treatments Tylenol  and ibuprofen in past helped slightly Diclofenac  oral did not help Blu Emu cream- helps a little Gabapentin  1200mg  BID to TID with mild to moderate improvement She has not tried Lyrica  Hydrocodone  helped in the past, it stopped working  She has not tried oxycodone  Tizanidine  did not help   Interval History Donna Rogers is here for follow-up due to Donna chronic pain.  She reports that Donna polyneuropathy is much improved since starting Lyrica .  She feels that this  works much better than the gabapentin .  She says she would like to try Qutenza however cost is prohibitive at this time.  She continues to have lower back pain and shoulder pain bilaterally that limits Donna activities.  The lower back pain did not improve significantly with the change to Lyrica .  She has not had any side effects with this medication.  She reports she previously tried tramadol  and it initially helped mildly but later did not provide significant benefit.  She will occasionally get a tingling sensation in the fourth and fifth digits on both hands but this is not present currently.  She feels like she has poor balance overall however has not had any recent falls.  She does not use a cane for ambulation at this time but she has been home.     Interval History 10/01/22 Donna Rogers is here for follow-up regarding Donna chronic pain.  She reports that oxycodone  is helping to control Donna lower back pain.  She uses this medication sparingly only when the pain is very severe.  The medication allows Donna to do more active activities and tasks at Donna house.  She continues to take Lyrica  for burning sensation in Donna feet.  She does feel like the medication is not helping as much as it did when we spoke at Donna last visit.  Lyrica  does not currently give Donna the drunk feeling she was  getting when she is taking gabapentin .  She denies any history of CKD or known history of kidney dysfunction.  She continues to have right shoulder pain.  She says that she would like to repeat the right shoulder injection.  She previously had this completed by Donna orthopedic doctor.  She asked if we can completed at Donna next visit if possible.     Interval History 4/1 Donna Rogers is here regarding Donna chronic pain and for a right shoulder injection.  She reports that oxycodone  has been providing benefit to Donna pain however she feels like the 5 mg dose is not strong enough.  Previously when she tried 2 tablets of the oxycodone  5  mg this provided better relief for Donna pain.  She continues to have back pain that is sometimes shooting down Donna right leg.  She continues to have pain in Donna right shoulder that is limiting.  She does not have any side effects with these are working well for Donna and not.     Interval history 02/05/2023 Donna Rogers is here for follow-up regarding Donna chronic low back pain, shoulder pain and polyneuropathy.  She reports Donna shoulder is doing much better since we did the shoulder injection last visit.  Oxycodone  7.5 3 times daily is helping Donna pain stay very controlled.  She continues to have a lot of lumbar back pain however it is more tolerable with this medication.  For the past few months she has been using Lyrica  150 mg twice daily noted on PDMP review.  In February and March she was getting 200 mg of Lyrica  twice daily however I think she may have reverted back to an older prescription and started taking up to 150 mg tabs from the pharmacy.  She is unsure what kind of difference is made to Donna pain.  She does continue to have pain in Donna feet that has been attributed to polyneuropathy.     Interval history 03/12/2023 Donna Rogers is here for follow-up regarding Donna chronic lower back pain, right shoulder pain and polyneuropathy.  Patient feels that Donna pain is doing better overall since Donna last visit.  She does ask when she can do Donna repeat shoulder injection as the pain is gradually coming back.  She does not feel like she needs an injection today because pain is still overall controlled.  She reports current medications are working well for Donna and not causing any significant side effects.  Patient reports she is using Percocet sparingly and this appears consistent with Donna pill count.  Interval history 05/13/2023 Donna Rogers is here for follow-up regarding Donna chronic lower back pain, right shoulder pain and polyneuropathy.  Patient reports that she continues to have pain in these areas however it is  doing better than it was previously.  She has not needed to use this many tabs of oxycodone  and still has 90 tabs left today.  She reports Donna shoulder pain is under control as well.  She does have numbness and pain in Donna feet, she reports a cold sensation in Donna feet.  When she does need oxycodone  she reports it is helping Donna pain.   Interval history 08/25/22 Donna Rogers is tomorrow.  She was recently started on CPAP for OSA. She continues to have pain in Donna lower back. Oxycodone  helps Donna pain, she uses sparingly only when pain is severe.  Pain is also coming back in R shoulder.   She also has pain from polyneuropathy in Donna b/l  feet. Lyrica  is helping but not keeping it under control.    Interval history 09/26/22 Pt continues to have severe pain in Donna feet. This visit was scheduled as f/u visit instead of Qutenza. She is still interested in Qutenza treatment. She also has pain in Donna lower back. Oxycodone  continues to help Donna pain, still using it only when pain is very severe. She continues to have R shoulder pain.   Interval history 11/08/23 Patient is here for right shoulder injection, reports good benefit last time this was completed.  She also continues to have lower back pain, MRI completed but results pending.  Oxycodone  helping keep Donna pain under control, she only uses intermittently when pain is very severe.  No side effects with the medication.  Qutenza treatment was declined by insurance, needs to try lidocaine  patches first.  Interval history 01/07/2024 Patient reports continued back pain, limiting Donna activities.  Unfortunately shoulder injection last visit did not help as much as it did the first time.  She would like to try a repeat injection to see if this will help again.  She had an MRI of Donna lumbar spine completed which showed multilevel moderate to severe bilateral facet arthropathy, moderate spinal canal stenosis L1-2, L2-3 and L3-4 and multilevel foraminal stenosis.  Donna  pain is primarily axial located in Donna bilateral lower L-spine.  Donna back pain is improved with bending forward, worsens when she gets back up.  She denies pain radiating into Donna legs and feet. She reports continued benefit with oxycodone , no significant side effects reported.   Interval history 01/07/2024 Follow-up for chronic low back pain. Last seen in May. Pain is primarily localized to the low back. Reports tolerating the pain, but it is functionally limiting. Cannot stand for long periods, which interferes with activities of daily living such as cooking and washing dishes. Uses oxycodone  (Percocet) sparingly for severe pain exacerbations, requiring rest afterward. Has not needed a refill since May. Peripheral neuropathy symptoms in feet are improved. Using a topical product called [employer] Foot Cream from a local store  which provides significant relief with infrequent use. Continues pregabalin  (Lyrica ) 200 mg twice daily for nerve pain, with good compliance. Numbness in the right fourth and fifth digits has resolved. Experiences occasional morning stiffness in the hands but no residual numbness. Diabetes management has improved significantly; blood glucose is now in the 100s, down from 300-400s.  Reports feeling much better overall with improved glycemic control.  Reports occasional radiating pain down the right leg into hip and to the thigh,but describes the back pain as more severe.  Interval History 08/25/24 The patient reports a recent Right lumbar L3, L4 medial branch blocks and L5 dorsal ramus injection procedure provided no relief. She experienced sharp, right-sided pain radiating to the groin for approximately one week post-procedure, which has since resolved.  She continues to experience chronic pain, with an average pain level of 7/10. The pain is located in Donna back and knees, which she rates as equal in severity. She also reports significant burning pain in Donna feet, which is  worse in the morning and can be severe enough to require pain medication. She notes Donna feet feel cold.  The patient reports that oxycodone  provides relief  but does not last long enough. She takes it only as needed for severe pain and is not using it 3 times a day every day.   She has a new complaint of left knee pain that began within the last month.  The pain is located on the medial side of the knee, and she is unable to fully bend it. She denies any history of knee x-rays.  She reports that Donna back pain now radiates down both legs, which is a change from previous visits where the pain was primarily localized to Donna back. She notes that standing for long periods exacerbates the pain, but leaning forward on a support, such as a grocery cart, provides relief. She uses a cane intermittently for ambulation due to instability.  Medications - Oxycodone  PRN - Lyrica  (pregabalin ) - Celexa   Pain Inventory Average Pain 7 Pain Right Now 5 My pain is sharp, stabbing, and aching  In the last 24 hours, has pain interfered with the following? General activity 10 Relation with others 2 Enjoyment of life 10 What TIME of day is your pain at its worst? morning , daytime, evening, and night Sleep (in general) Poor  Pain is worse with: walking and standing Pain improves with: medication Relief from Meds: 6  Family History  Problem Relation Age of Onset   Alzheimer's disease Mother    Lung cancer Mother 60   Diabetes Father    Heart disease Father    Arthritis Sister    Diabetes Sister    Diabetes Brother    Hypertension Brother    Colon cancer Neg Hx    Stomach cancer Neg Hx    Colon polyps Neg Hx    Esophageal cancer Neg Hx    Rectal cancer Neg Hx    Social History   Socioeconomic History   Marital status: Significant Other    Spouse name: Not on file   Number of children: 1   Years of education: 11th   Highest education level: 11th grade  Occupational History   Occupation:  retired  Tobacco Use   Smoking status: Former    Current packs/day: 1.00    Average packs/day: 1 pack/day for 46.0 years (46.0 ttl pk-yrs)    Types: Cigarettes   Smokeless tobacco: Never  Vaping Use   Vaping status: Some Days  Substance and Sexual Activity   Alcohol use: No    Alcohol/week: 0.0 standard drinks of alcohol   Drug use: No   Sexual activity: Not Currently  Other Topics Concern   Not on file  Social History Narrative   The patient is divorced she has 1 son who is married that she has a grandchild.   She does not use alcohol or drugs she drinks 3-4 caffeinated beverages daily   She is a smoker   She lives with Donna boyfriend   Social Drivers of Health   Tobacco Use: Medium Risk (08/25/2024)   Patient History    Smoking Tobacco Use: Former    Smokeless Tobacco Use: Never    Passive Exposure: Not on file  Financial Resource Strain: Low Risk (04/20/2024)   Overall Financial Resource Strain (CARDIA)    Difficulty of Paying Living Expenses: Not hard at all  Food Insecurity: No Food Insecurity (04/20/2024)   Epic    Worried About Programme Researcher, Broadcasting/film/video in the Last Year: Never true    Ran Out of Food in the Last Year: Never true  Transportation Needs: No Transportation Needs (04/20/2024)   Epic    Lack of Transportation (Medical): No    Lack of Transportation (Non-Medical): No  Physical Activity: Inactive (04/20/2024)   Exercise Vital Sign    Days of Exercise per Week: 0 days    Minutes of Exercise per  Session: 0 min  Stress: Stress Concern Present (04/20/2024)   [employer] of Occupational Health - Occupational Stress Questionnaire    Feeling of Stress: To some extent  Social Connections: Moderately Isolated (04/20/2024)   Social Connection and Isolation Panel    Frequency of Communication with Friends and Family: Twice a week    Frequency of Social Gatherings with Friends and Family: Twice a week    Attends Religious Services: Never    Database Administrator or  Organizations: No    Attends Banker Meetings: Never    Marital Status: Living with partner  Depression (PHQ2-9): Low Risk (08/25/2024)   Depression (PHQ2-9)    PHQ-2 Score: 2  Alcohol Screen: Low Risk (04/20/2024)   Alcohol Screen    Last Alcohol Screening Score (AUDIT): 0  Housing: Low Risk (04/20/2023)   Housing    Last Housing Risk Score: 0  Utilities: Not At Risk (04/20/2024)   Epic    Threatened with loss of utilities: No  Health Literacy: Adequate Health Literacy (04/20/2024)   B1300 Health Literacy    Frequency of need for help with medical instructions: Never   Past Surgical History:  Procedure Laterality Date   BREAST LUMPECTOMY Right 2018   BREAST LUMPECTOMY WITH RADIOACTIVE SEED LOCALIZATION Left 03/03/2016   Procedure: BREAST LUMPECTOMY WITH RADIOACTIVE SEED LOCALIZATION;  Surgeon: Vicenta Poli, MD;  Location: [facility] OR;  Service: General;  Laterality: Left;   COLONOSCOPY  08/2002   RMR: internal hemorrhoids   COLONOSCOPY N/A 10/21/2015   Procedure: COLONOSCOPY;  Surgeon: Lamar CHRISTELLA Hollingshead, MD;  Location: [facility];  Service: Endoscopy;  Laterality: N/A;  250 - moved to 2:35 - office to notify pt   CYST REMOVAL TRUNK     ESOPHAGOGASTRODUODENOSCOPY  08/2002   RMR: nonerosive reflux esophagitis, hiatal hernia   TONSILLECTOMY     WISDOM TOOTH EXTRACTION     Past Surgical History:  Procedure Laterality Date   BREAST LUMPECTOMY Right 2018   BREAST LUMPECTOMY WITH RADIOACTIVE SEED LOCALIZATION Left 03/03/2016   Procedure: BREAST LUMPECTOMY WITH RADIOACTIVE SEED LOCALIZATION;  Surgeon: Vicenta Poli, MD;  Location: [facility] OR;  Service: General;  Laterality: Left;   COLONOSCOPY  08/2002   RMR: internal hemorrhoids   COLONOSCOPY N/A 10/21/2015   Procedure: COLONOSCOPY;  Surgeon: Lamar CHRISTELLA Hollingshead, MD;  Location: [facility];  Service: Endoscopy;  Laterality: N/A;  250 - moved to 2:35 - office to notify pt   CYST REMOVAL TRUNK     ESOPHAGOGASTRODUODENOSCOPY  08/2002   RMR:  nonerosive reflux esophagitis, hiatal hernia   TONSILLECTOMY     WISDOM TOOTH EXTRACTION     Past Medical History:  Diagnosis Date   Allergy    seasonal allergies   Anemia    hx of   Arthritis    rheumatoid   Depression    on meds   Diabetes (HCC)    Diabetic peripheral neuropathy (HCC)    GERD (gastroesophageal reflux disease)    on meds   Headache    Hyperlipidemia    on meds   Hyperplastic rectal polyp 10/21/2015   Hypertension    pt. denies   Lymphocytic colitis    Neuropathy    Rheumatoid arthritis (HCC)    Shortness of breath dyspnea    with exertion   BP 121/75   Pulse 83   Ht 5' 1 (1.549 m)   Wt 208 lb (94.3 kg)   SpO2 97%   BMI 39.30 kg/m  Opioid Risk Score:   Fall Risk Score:  `1  Depression screen PHQ 2/9     08/25/2024    1:53 PM 04/20/2024   11:51 AM 04/07/2024    4:58 PM 01/07/2024    1:21 PM 01/03/2024    3:17 PM 11/08/2023    1:20 PM 08/26/2023    1:00 PM  Depression screen PHQ 2/9  Decreased Interest 1 1 0 1 0 0 0  Down, Depressed, Hopeless 1 3 3 1 1  0 0  PHQ - 2 Score 2 4 3 2 1  0 0  Altered sleeping  0 1  3    Tired, decreased energy  3 3  3     Change in appetite  0 0  0    Feeling bad or failure about yourself   1 3  3     Trouble concentrating  0 0  0    Moving slowly or fidgety/restless  0 3  3    Suicidal thoughts  0 1  0    PHQ-9 Score  8  14   13      Difficult doing work/chores  Very difficult Very difficult  Somewhat difficult       Data saved with a previous flowsheet row definition      Review of Systems  Musculoskeletal:  Positive for back pain and gait problem.       Left knee pain  All other systems reviewed and are negative.      Objective:   Physical Exam   Physical Exam Gen: no distress, normal appearing HEENT: oral mucosa pink and moist, NCAT Chest: normal effort, normal rate of breathing Abd: soft, non-distended Ext: no edema Psych: pleasant, appropriate Skin: intact Neuro: CN 2-12 grossly intact,  follows commands No focal motor deficits noted Decreased sensation to LT in b/l LE below the knee No hand sensory deficits noted today  MSK: Examination of the left knee reveals tenderness to palpation on the medial aspect. Pain with Range of motion. Straight leg raise is negative for radicular pain. There is tenderness noted in the lumbar spinal area.  Prior exam: Musculoskeletal: Only slight right shoulder pain with internal rotation Gait appears a little unsteady-advised patient to use a cane to help prevent falls Shoulder: Right shoulder examination is normal. Full range of motion without pain. No residual numbness. Back Examination: Tenderness to palpation over the right lower lumbar paraspinous muscles.  Minimal tenderness over the left lumbar region. Pain with lumbar extension. Axial loading provokes pain.  Pain is localized to the lower right lumbar region, specifically noted as worse in the mid to lower lumbar spine.     Lumbar xray 08/26/17 FINDINGS: Five lumbar type vertebral bodies are well visualized. Vertebral body height is well maintained. Multilevel disc space narrowing is noted throughout the lumbar spine progressed in the interval from the prior exam. Osteophytic changes are noted as well. No anterolisthesis is seen. A somewhat rounded calcification is noted in the right upper quadrant which may be related to cholelithiasis. Clinical correlation is recommended.   IMPRESSION: Multilevel degenerative changes progressed when compared with the prior exam of 20/8.   Rounded calcification in the right upper quadrant may represent cholelithiasis.       MRI shoulder 08/22/21 IMPRESSION: 1. Full-thickness, full width tears of the supraspinatus and infraspinatus tendons with retraction to the glenoid. 2. Full-thickness subscapularis tear involving the mid to superior fibers with tendon gap up to 1.0 cm, and interstitial extension proximally. 3. Grade 2 muscle  atrophy of the supraspinatus, infraspinatus, and subscapularis muscles. 4. Moderate glenohumeral and acromioclavicular joint osteoarthritis. 5. Likely torn and retracted long head biceps tendon. 6. Degenerative posterosuperior labral tearing.    L spine MRI 11/01/23  FINDINGS: Segmentation: Standard.   Alignment:  Physiologic lumbar alignment is maintained.   Vertebrae: Degenerative endplate marrow changes at a few levels. No compression fractures.   Conus medullaris and cauda equina: The conus medullaris terminates at the level of L1-L2. The distal spinal cord signal intensity is normal.   Paraspinal and other soft tissues: The visualized abdomen and pelvis show no soft tissue abnormality. The visualized aorta is normal.   Disc levels:   L1-L2: Disc bulge. Moderate bilateral facet arthropathy. Moderate bilateral neuroforaminal stenosis. Moderate spinal canal stenosis.   L2-L3: Disc bulge. Severe bilateral facet arthropathy. Moderate bilateral neuroforaminal stenosis. Moderate spinal canal stenosis.   L3-L4: Disc bulge. Severe bilateral facet arthropathy. Severe right and moderate left neuroforaminal stenosis. Moderate spinal canal stenosis.   L4-L5: Disc bulge. Moderate bilateral facet arthropathy. Moderate bilateral neuroforaminal stenosis. Mild spinal canal stenosis.   L5-S1: Disc bulge. Severe bilateral facet arthropathy. Severe bilateral neuroforaminal stenosis. No spinal canal stenosis.   IMPRESSION: 1. Moderate canal stenoses at L1-L2, L2-L3, and L3-L4 secondary to disc bulging and facet arthropathy. Findings have progressed since prior examination in 2008 2. Severe foraminal stenoses at L3-L4 and L5-S1. Moderate foraminal stenoses at other levels.     Assessment & Plan:     Chronic lower back pain with bilateral radicular pain.  -Patient has lumbar spinal stenosis, multilevel facet joint and multilevel neuroforaminal stenosis. -Referral to neurosurgery for  back pain with radicular pain/symptomatic lumbar spinal stenosis- consult placed -UDS and pain agreement completed prior visit -Continue oxycodone  7.5mg  Q8h but adjust to Q6h pRN -Continue UDS and pill counts.  Continue PDMP monitoring.  Pain contract completed prior visit.  -Continue use of TENS -List of Home exercises for back pain provided prior visit.  Advised to try to do 3 times a weeks.  Patient reports she completed this for 6 weeks without benefit - Pain is primarily axial at this time in Donna right mid to lower lumbar spine, Refer to Dr. Sixto for MBB Right lumbar L3, L4 medial branch blocks and L5 dorsal ramus injection  - Pt reports no benefit -Referral for epidural steroid injection for radicular leg pain.    Right shoulder pain with biceps SS, IS and Subscapularis tears, GH and AC arthritis  -Continue medications as above -Voltaren  gel as needed - Patient was scheduled for right shoulder injection but shoulder pain has improved so will not complete today  DM with polyneuropathy -Cont Lyrica  200mg  BID -Qutenza was declined by insurance in the past. Will attempt to resubmit.   Intermittent numbness in Donna fourth and fifth digit, Possibly some ulnar nerve irritation,  -Improved, denies any recent symptoms   Rheumatoid arthritis and polyarthralgia history -Continue f/u with rheumatology as directed  L knee pain.  -Order an x-ray of the left knee to evaluate for arthritis before considering an injection. Plan for a cortisone injection if OA noted.    "

## 2024-08-28 ENCOUNTER — Ambulatory Visit (HOSPITAL_COMMUNITY)
Admission: RE | Admit: 2024-08-28 | Discharge: 2024-08-28 | Disposition: A | Discharge status: Home / Self Care | Source: Ambulatory Visit | Attending: Physical Medicine & Rehabilitation | Admitting: Physical Medicine & Rehabilitation

## 2024-08-28 DIAGNOSIS — M1712 Unilateral primary osteoarthritis, left knee: Secondary | ICD-10-CM | POA: Diagnosis present

## 2024-08-29 LAB — DRUG TOX MONITOR 1 W/CONF, ORAL FLD
Amphetamines: NEGATIVE ng/mL
Barbiturates: NEGATIVE ng/mL
Benzodiazepines: NEGATIVE ng/mL
Buprenorphine: NEGATIVE ng/mL
Cocaine: NEGATIVE ng/mL
Codeine: NEGATIVE ng/mL
Cotinine: >250 ng/mL — ABNORMAL HIGH
Dihydrocodeine: NEGATIVE ng/mL
Fentanyl: NEGATIVE ng/mL
Heroin Metabolite: NEGATIVE ng/mL
Hydrocodone: NEGATIVE ng/mL
Hydromorphone: NEGATIVE ng/mL
MARIJUANA: NEGATIVE ng/mL
MDMA: NEGATIVE ng/mL
Meprobamate: NEGATIVE ng/mL
Methadone: NEGATIVE ng/mL
Morphine: NEGATIVE ng/mL
Nicotine Metabolite: POSITIVE ng/mL — AB
Norhydrocodone: NEGATIVE ng/mL
Noroxycodone: 11.3 ng/mL — ABNORMAL HIGH
Opiates: POSITIVE ng/mL — AB
Oxycodone: 33.8 ng/mL — ABNORMAL HIGH
Oxymorphone: NEGATIVE ng/mL
Phencyclidine: NEGATIVE ng/mL
Tapentadol: NEGATIVE ng/mL
Tramadol: NEGATIVE ng/mL
Zolpidem: NEGATIVE ng/mL

## 2024-08-29 LAB — DRUG TOX ALC METAB W/CON, ORAL FLD: Alcohol Metabolite: NEGATIVE ng/mL

## 2024-09-05 NOTE — Addendum Note (Signed)
 Addended by: Curlie Macken on: 09/05/2024 12:17 AM   Modules accepted: Orders

## 2024-09-14 ENCOUNTER — Encounter: Admitting: Physical Medicine & Rehabilitation

## 2024-09-29 ENCOUNTER — Encounter: Admitting: Physical Medicine & Rehabilitation

## 2024-10-05 ENCOUNTER — Encounter: Admitting: Physical Medicine & Rehabilitation

## 2024-10-13 ENCOUNTER — Encounter: Admitting: Physical Medicine & Rehabilitation

## 2025-04-24 ENCOUNTER — Ambulatory Visit: Payer: Self-pay

## 2025-04-26 ENCOUNTER — Ambulatory Visit
